# Patient Record
Sex: Male | Born: 1935 | Race: Black or African American | Hispanic: No | Marital: Married | Smoking: Former smoker
Health system: Southern US, Community
[De-identification: ages and names within clinical notes are randomized; demographics above are authoritative.]

## PROBLEM LIST (undated history)

## (undated) ENCOUNTER — Emergency Department (HOSPITAL_COMMUNITY): Admission: EM | Payer: Medicare Other | Source: Home / Self Care

## (undated) DIAGNOSIS — N184 Chronic kidney disease, stage 4 (severe): Secondary | ICD-10-CM

## (undated) DIAGNOSIS — J9611 Chronic respiratory failure with hypoxia: Secondary | ICD-10-CM

## (undated) DIAGNOSIS — E0590 Thyrotoxicosis, unspecified without thyrotoxic crisis or storm: Secondary | ICD-10-CM

## (undated) DIAGNOSIS — I5022 Chronic systolic (congestive) heart failure: Secondary | ICD-10-CM

## (undated) DIAGNOSIS — I428 Other cardiomyopathies: Secondary | ICD-10-CM

## (undated) DIAGNOSIS — I4719 Other supraventricular tachycardia: Secondary | ICD-10-CM

## (undated) DIAGNOSIS — I10 Essential (primary) hypertension: Secondary | ICD-10-CM

## (undated) DIAGNOSIS — R58 Hemorrhage, not elsewhere classified: Secondary | ICD-10-CM

## (undated) DIAGNOSIS — I480 Paroxysmal atrial fibrillation: Secondary | ICD-10-CM

## (undated) DIAGNOSIS — Z9989 Dependence on other enabling machines and devices: Secondary | ICD-10-CM

## (undated) DIAGNOSIS — G4733 Obstructive sleep apnea (adult) (pediatric): Secondary | ICD-10-CM

## (undated) DIAGNOSIS — E042 Nontoxic multinodular goiter: Secondary | ICD-10-CM

## (undated) DIAGNOSIS — S82402A Unspecified fracture of shaft of left fibula, initial encounter for closed fracture: Secondary | ICD-10-CM

## (undated) DIAGNOSIS — K219 Gastro-esophageal reflux disease without esophagitis: Secondary | ICD-10-CM

## (undated) DIAGNOSIS — J449 Chronic obstructive pulmonary disease, unspecified: Secondary | ICD-10-CM

## (undated) DIAGNOSIS — R042 Hemoptysis: Secondary | ICD-10-CM

## (undated) DIAGNOSIS — I82409 Acute embolism and thrombosis of unspecified deep veins of unspecified lower extremity: Secondary | ICD-10-CM

## (undated) DIAGNOSIS — I471 Supraventricular tachycardia: Secondary | ICD-10-CM

## (undated) HISTORY — DX: Acute embolism and thrombosis of unspecified deep veins of unspecified lower extremity: I82.409

## (undated) HISTORY — PX: FOOT SURGERY: SHX648

## (undated) HISTORY — PX: CARDIAC DEFIBRILLATOR PLACEMENT: SHX171

## (undated) HISTORY — DX: Other cardiomyopathies: I42.8

## (undated) HISTORY — DX: Essential (primary) hypertension: I10

---
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
## 2005-07-01 HISTORY — PX: VENA CAVA FILTER PLACEMENT: SUR1032

## 2005-08-11 ENCOUNTER — Encounter: Payer: Self-pay | Admitting: Pulmonary Disease

## 2006-02-11 ENCOUNTER — Encounter: Payer: Self-pay | Admitting: Pulmonary Disease

## 2007-04-29 ENCOUNTER — Ambulatory Visit: Payer: Self-pay | Admitting: Pulmonary Disease

## 2007-06-11 ENCOUNTER — Ambulatory Visit: Payer: Self-pay | Admitting: Oncology

## 2007-07-07 DIAGNOSIS — G4730 Sleep apnea, unspecified: Secondary | ICD-10-CM | POA: Insufficient documentation

## 2007-07-09 ENCOUNTER — Other Ambulatory Visit: Payer: Self-pay | Admitting: Oncology

## 2007-07-09 LAB — CBC WITH DIFFERENTIAL/PLATELET
BASO%: 1.3 % (ref 0.0–2.0)
Eosinophils Absolute: 0.1 10*3/uL (ref 0.0–0.5)
MCHC: 33.9 g/dL (ref 32.0–35.9)
MONO#: 0.6 10*3/uL (ref 0.1–0.9)
NEUT#: 3 10*3/uL (ref 1.5–6.5)
RBC: 4.42 10*6/uL (ref 4.20–5.71)
RDW: 15.1 % — ABNORMAL HIGH (ref 11.2–14.6)
WBC: 4.9 10*3/uL (ref 4.0–10.0)

## 2007-07-09 LAB — COMPREHENSIVE METABOLIC PANEL
ALT: 20 U/L (ref 0–53)
Albumin: 4.3 g/dL (ref 3.5–5.2)
Alkaline Phosphatase: 69 U/L (ref 39–117)
CO2: 25 mEq/L (ref 19–32)
Glucose, Bld: 91 mg/dL (ref 70–99)
Potassium: 5.9 mEq/L — ABNORMAL HIGH (ref 3.5–5.3)
Sodium: 140 mEq/L (ref 135–145)
Total Bilirubin: 0.7 mg/dL (ref 0.3–1.2)
Total Protein: 7.8 g/dL (ref 6.0–8.3)

## 2007-07-10 ENCOUNTER — Encounter: Payer: Self-pay | Admitting: Pulmonary Disease

## 2007-08-03 ENCOUNTER — Encounter: Payer: Self-pay | Admitting: Pulmonary Disease

## 2007-08-24 ENCOUNTER — Ambulatory Visit: Payer: Self-pay | Admitting: Pulmonary Disease

## 2008-02-10 ENCOUNTER — Ambulatory Visit: Payer: Self-pay | Admitting: Oncology

## 2008-06-01 ENCOUNTER — Ambulatory Visit: Payer: Self-pay | Admitting: Pulmonary Disease

## 2008-06-14 ENCOUNTER — Ambulatory Visit: Payer: Self-pay | Admitting: Pulmonary Disease

## 2008-07-04 ENCOUNTER — Ambulatory Visit: Payer: Self-pay | Admitting: Oncology

## 2008-07-06 LAB — CBC WITH DIFFERENTIAL/PLATELET
Eosinophils Absolute: 0 10*3/uL (ref 0.0–0.5)
LYMPH%: 19.9 % (ref 14.0–48.0)
MONO#: 0.5 10*3/uL (ref 0.1–0.9)
NEUT#: 3.3 10*3/uL (ref 1.5–6.5)
Platelets: 123 10*3/uL — ABNORMAL LOW (ref 145–400)
RBC: 4.35 10*6/uL (ref 4.20–5.71)
RDW: 16 % — ABNORMAL HIGH (ref 11.2–14.6)
WBC: 4.8 10*3/uL (ref 4.0–10.0)
lymph#: 1 10*3/uL (ref 0.9–3.3)

## 2008-07-06 LAB — COMPREHENSIVE METABOLIC PANEL
ALT: 32 U/L (ref 0–53)
Albumin: 3.9 g/dL (ref 3.5–5.2)
CO2: 25 mEq/L (ref 19–32)
Calcium: 9.2 mg/dL (ref 8.4–10.5)
Chloride: 107 mEq/L (ref 96–112)
Glucose, Bld: 96 mg/dL (ref 70–99)
Potassium: 4.6 mEq/L (ref 3.5–5.3)
Sodium: 139 mEq/L (ref 135–145)
Total Protein: 7.2 g/dL (ref 6.0–8.3)

## 2008-08-17 ENCOUNTER — Encounter: Payer: Self-pay | Admitting: Pulmonary Disease

## 2009-01-07 ENCOUNTER — Ambulatory Visit (HOSPITAL_BASED_OUTPATIENT_CLINIC_OR_DEPARTMENT_OTHER): Admission: RE | Admit: 2009-01-07 | Discharge: 2009-01-07 | Payer: Self-pay | Admitting: Internal Medicine

## 2009-01-14 ENCOUNTER — Ambulatory Visit: Payer: Self-pay | Admitting: Internal Medicine

## 2009-01-20 ENCOUNTER — Ambulatory Visit: Payer: Self-pay | Admitting: Oncology

## 2009-09-05 ENCOUNTER — Ambulatory Visit (HOSPITAL_COMMUNITY)
Admission: RE | Admit: 2009-09-05 | Discharge: 2009-09-05 | Payer: Self-pay | Source: Home / Self Care | Admitting: Interventional Cardiology

## 2009-09-05 ENCOUNTER — Encounter: Payer: Self-pay | Admitting: Pulmonary Disease

## 2009-09-22 ENCOUNTER — Ambulatory Visit: Payer: Self-pay | Admitting: Pulmonary Disease

## 2009-09-22 DIAGNOSIS — J449 Chronic obstructive pulmonary disease, unspecified: Secondary | ICD-10-CM

## 2009-10-10 ENCOUNTER — Ambulatory Visit (HOSPITAL_COMMUNITY): Admission: RE | Admit: 2009-10-10 | Discharge: 2009-10-10 | Payer: Self-pay | Admitting: Gastroenterology

## 2009-10-30 ENCOUNTER — Ambulatory Visit (HOSPITAL_COMMUNITY): Admission: RE | Admit: 2009-10-30 | Discharge: 2009-10-30 | Payer: Self-pay | Admitting: Cardiology

## 2009-10-31 ENCOUNTER — Telehealth: Payer: Self-pay | Admitting: Pulmonary Disease

## 2009-12-13 ENCOUNTER — Ambulatory Visit: Payer: Self-pay | Admitting: Pulmonary Disease

## 2009-12-13 DIAGNOSIS — G4733 Obstructive sleep apnea (adult) (pediatric): Secondary | ICD-10-CM

## 2010-04-25 ENCOUNTER — Encounter: Admission: RE | Admit: 2010-04-25 | Discharge: 2010-04-25 | Payer: Self-pay | Admitting: Internal Medicine

## 2010-06-06 ENCOUNTER — Encounter (HOSPITAL_COMMUNITY)
Admission: RE | Admit: 2010-06-06 | Discharge: 2010-07-31 | Payer: Self-pay | Source: Home / Self Care | Attending: Internal Medicine | Admitting: Internal Medicine

## 2010-06-11 ENCOUNTER — Ambulatory Visit: Payer: Self-pay | Admitting: Pulmonary Disease

## 2010-06-12 ENCOUNTER — Telehealth: Payer: Self-pay | Admitting: Pulmonary Disease

## 2010-07-05 ENCOUNTER — Ambulatory Visit: Admit: 2010-07-05 | Payer: Self-pay | Admitting: Pulmonary Disease

## 2010-07-06 ENCOUNTER — Telehealth: Payer: Self-pay | Admitting: Pulmonary Disease

## 2010-07-21 ENCOUNTER — Encounter: Payer: Self-pay | Admitting: Internal Medicine

## 2010-07-23 ENCOUNTER — Ambulatory Visit
Admission: RE | Admit: 2010-07-23 | Discharge: 2010-07-23 | Payer: Self-pay | Source: Home / Self Care | Attending: Pulmonary Disease | Admitting: Pulmonary Disease

## 2010-07-31 NOTE — Assessment & Plan Note (Signed)
Summary: consult for dyspnea   Copy to:  Dr. Verdis Prime Primary Provider/Referring Provider:  Dr. Mikeal Hawthorne  CC:  Pulmonary Consult for SOB.Marland Kitchen  History of Present Illness: The pt is a 77 y/o male who I have been asked to see for dyspnea.  He has known minimal obstructive lung disease from pfts while living in [place], and had been treated with advair that he didn't take compliantly.  I have followed him for osa in the past, and he was c/o worsening dyspnea in 2009 for which I did repeat pfts that showed at least moderate obstructive lung disease.  A f/u apptm was scheduled to start on more aggressive treatment, and the pt no-showed, and was lost to followup.  He comes in today where his sob has worsened to the point that it is impacting his QOL.  He gets winded walking to the mailbox, and also walking one flight of stairs.  He has a less than one block doe, but also feels his significant MSK issues also contribute.  He denies any cough or congestion, and produces minimal clear mucus intermittantly.  He does have some LE edema.  He has a h/o VTE, but is s/p IVC filter in 2007.  He is on chronic anticoagulation.  The pt was on amiodarone for his atrial arrhythmia, but with his recent worsening sob and ESR of 46, this has been discontinued.  He has had a recent set of [facility], which show stable moderate obstructive disease, but also slightly improved DLCO from 2009.    Preventive Screening-Counseling & Management  Alcohol-Tobacco     Smoking Status: quit  Current Medications (verified): 1)  Aldactone 25 Mg  Tabs (Spironolactone) .... Take 1 Tablet By Mouth Two Times A Day 2)  Lasix 40 Mg  Tabs (Furosemide) .... Take 1 1/2 Tabs By Mouth Once Daily 3)  Warfarin Sodium 6 Mg  Tabs (Warfarin Sodium) .... Use As Directed By [facility] 4)  Digoxin 0.125 Mg  Tabs (Digoxin) .... Take 1 Tablet By Mouth Once A Day 5)  Multivitamins   Tabs (Multiple Vitamin) .... Take 1 Tablet By Mouth Once A Day 6)  Bayer  Low Strength 81 Mg  Tbec (Aspirin) .... Take 1 Tablet By Mouth Once A Day 7)  Lyrica 150 Mg Caps (Pregabalin) .... Two Times A Day 8)  Vitamin C .... Once Daily 9)  Tamsulosin Hcl 0.4 Mg Caps (Tamsulosin Hcl) .... Once Daily 10)  Calcitriol 0.5 Mcg Caps (Calcitriol) .... Once Daily  Allergies (verified): No Known Drug Allergies  Past History:  Past Medical History: nonischemic CM with EF 35%, s/p defibrillator Atrial fibrillation SLEEP APNEA (ICD-780.57) HYPERTENSION (ICD-401.9) Recurrent DVT--s/p IVC filter. Renal insufficiency  Past Surgical History: none  Family History: Reviewed history and no changes required. heart disease-mother Rheumatism-mother  Social History: Reviewed history and no changes required. Patient states former smoker.  Quit in 1985.  Smoked 1-1/2 pdd x 28yrs. Quit drinking and using marajauna x 43yrs. married 1son Retired from Education  Review of Systems       The patient complains of shortness of breath with activity and productive cough.  The patient denies shortness of breath at rest, non-productive cough, coughing up blood, chest pain, irregular heartbeats, acid heartburn, indigestion, loss of appetite, weight change, abdominal pain, difficulty swallowing, sore throat, tooth/dental problems, headaches, nasal congestion/difficulty breathing through nose, sneezing, itching, ear ache, anxiety, depression, hand/feet swelling, joint stiffness or pain, rash, change in color of mucus, and fever.  Vital Signs:  Patient profile:   77 year old male Height:      74 inches Weight:      295.13 pounds BMI:     38.03 O2 Sat:      97 % on Room air Temp:     97.6 degrees F oral Pulse rate:   73 / minute BP sitting:   104 / 72  (right arm) Cuff size:   large  Vitals Entered By: Gweneth Dimitri RN (September 22, 2009 11:25 AM)  O2 Flow:  Room air CC: Pulmonary Consult for SOB. Comments Medications reviewed with patient Daytime contact number verified with  patient. Gweneth Dimitri RN  September 22, 2009 11:24 AM    Physical Exam  General:  ow male in nad Eyes:  PERRLA and EOMI.   Nose:  enlarged turbinates and deviated septum with nasal obstruction Mouth:  elongation of soft palate and uvula Neck:  no jvd, tmg, LN Lungs:  mildly decreased bs, no wheezing or rhonchi Heart:  sounds regular, 2/6 sem Abdomen:  soft and nontender, bs+ Extremities:  2+ edema bilat, no cyanosis pulses diminished bilat but present. Neurologic:  alert and oriented, moves all 4.   Impression & Recommendations:  Problem # 1:  EMPHYSEMA (ICD-492.8) the pt has moderate airflow obstruction by his most recent pfts, and I think he would benefit from treatment with a long-acting bronchodilator regimen.  Given his atrial arrhythmia history, will start with an anticholinergic such as spiriva.  We can intensify therapy from there if he has only partial response.    Problem # 2:  DYSPNEA (ICD-786.05) Multifactorial.  He has obesity and deconditioning issues, moderate obstructive lung disease, known cardiomyopathy.  I do not think amiodarone was playing a signficant role here since his DLCO was actually a little better from prior, and xrays did not show obvious pneumonitis.  He is now off the medication, and may take weeks to be totally cleared.  If there is another alternative, would probably avoid amiodarone in light of his underlying lung disease and ongoing symptoms.  If this is the only/best choice, we can follow pulmonary status more closely while on therapy.  Will send a note to Dr. Katrinka Blazing.  Medications Added to Medication List This Visit: 1)  Lyrica 150 Mg Caps (Pregabalin) .... Two times a day 2)  Vitamin C  .... Once daily 3)  Tamsulosin Hcl 0.4 Mg Caps (Tamsulosin hcl) .... Once daily 4)  Calcitriol 0.5 Mcg Caps (Calcitriol) .... Once daily  Other Orders: Consultation Level IV (09811)  Patient Instructions: 1)  will try on spiriva one inhalation each am 2)  use  xopenex inhaler for rescue only...Marland Kitchen2 puffs up to every 4-6 hrs if needed for persistent shortness of breath. 3)  will talk with Dr. Katrinka Blazing about your heart medication.   Immunization History:  Influenza Immunization History:    Influenza:  historical (03/31/2009)   Appended Document: consult for dyspnea megan, please let the pt know that I need to see him back the end of this month if he doesn't already have a f/u visit scheduled.  thanks.  Appended Document: consult for dyspnea KC, pt has an appt with you scheduled for 10-30-2009 at 11:00am. Is that date ok?   Appended Document: consult for dyspnea that is ok

## 2010-07-31 NOTE — Assessment & Plan Note (Signed)
Summary: rov for emphysema, osa   Copy to:  Dr. Verdis Prime Primary Provider/Referring Provider:  Dr. Mikeal Hawthorne  CC:  Pt is here for a f/u appt.  Pt states he hasnt been using Spiriva and states he lost Xopenex hfa.  Pt c/o coughing up clear sputum.  Pt states he has not been sleeping well at night with cpap but couldn't give me an explanation as to why.  .  History of Present Illness: The pt comes in today for f/u of his known emphysema and osa.  He has not been taking his spiriva, and lost his rescue inhaler.  He continues to have doe, but no significant cough and only minimal white mucus.  He is wearing cpap compliantly, and denies any issues with tolerance regarding mask or pressure.  However, he continues to have major sleep hygiene and circadian rhythm issues that are self induced, and will require behavioral changes on his part to improve.  Current Medications (verified): 1)  Lasix 40 Mg  Tabs (Furosemide) .... Take 1 1/2 Tabs By Mouth Once Daily 2)  Warfarin Sodium 6 Mg  Tabs (Warfarin Sodium) .... Use As Directed By [facility] 3)  Digoxin 0.125 Mg  Tabs (Digoxin) .... Take 1 Tablet By Mouth Once A Day 4)  Multivitamins   Tabs (Multiple Vitamin) .... Take 1 Tablet By Mouth Once A Day 5)  Bayer Low Strength 81 Mg  Tbec (Aspirin) .... Take 1 Tablet By Mouth Once A Day 6)  Lyrica 150 Mg Caps (Pregabalin) .... Two Times A Day 7)  Vitamin C .... Once Daily 8)  Tamsulosin Hcl 0.4 Mg Caps (Tamsulosin Hcl) .... Once Daily 9)  Calcitriol 0.5 Mcg Caps (Calcitriol) .... Once Daily  Allergies (verified): No Known Drug Allergies  Review of Systems       The patient complains of productive cough.  The patient denies shortness of breath with activity, shortness of breath at rest, non-productive cough, coughing up blood, chest pain, irregular heartbeats, acid heartburn, indigestion, loss of appetite, weight change, abdominal pain, difficulty swallowing, sore throat, tooth/dental problems,  headaches, nasal congestion/difficulty breathing through nose, sneezing, itching, ear ache, anxiety, depression, hand/feet swelling, joint stiffness or pain, rash, change in color of mucus, and fever.    Vital Signs:  Patient profile:   77 year old male Height:      74 inches Weight:      285 pounds O2 Sat:      91 % on Room air Temp:     97.4 degrees F oral Pulse rate:   71 / minute BP sitting:   120 / 78  (left arm) Cuff size:   large  Vitals Entered By: Arman Filter LPN (December 13, 2009 9:17 AM)  O2 Flow:  Room air CC: Pt is here for a f/u appt.  Pt states he hasnt been using Spiriva and states he lost Xopenex hfa.  Pt c/o coughing up clear sputum.  Pt states he has not been sleeping well at night with cpap but couldn't give me an explanation as to why.   Comments Medications reviewed with patient Arman Filter LPN  December 13, 2009 9:25 AM    Physical Exam  General:  ow male in nad Nose:  no pressure necrosis or skin breakdown from cpap mask Lungs:  clear to auscultation Heart:  rrr, no mrg Extremities:  mild LE edema, no cyanosis Neurologic:  alert and oriented, moves all 4.   Impression & Recommendations:  Problem # 1:  EMPHYSEMA (ICD-492.8) the pt has not been taking spiriva compliantly, and has lost xopenex inhaler.  I have asked him to get back on his spiriva daily, and to see if his breathing is at an acceptable baseline.  If not, could consider adding LABA/ICS combo for a trial, but need to be careful with beta-adrenergic drugs and his afib.  I have also encouraged him to work on weight reduction and exercise program  Problem # 2:  OBSTRUCTIVE SLEEP APNEA (ICD-327.23) he is wearing cpap compliantly, and has no issues with tolerance.  He is still having issues with his sleep hygeine and circadian rhythm, but he is going to have to work on this with behavioral therapy.  Medications Added to Medication List This Visit: 1)  Spiriva Handihaler 18 Mcg Caps (Tiotropium  bromide monohydrate) .... One puff in handihaler daily 2)  Xopenex Hfa 45 Mcg/act Aero (Levalbuterol tartrate) .... 2 puffs every 6hrs if needed for emergencies  Other Orders: Est. Patient Level III (60454)  Patient Instructions: 1)  use spiriva one inhalation each am only 2)  use xopenex inhaler only for rescue/emergencies if having a hard time.  Can use up to every 6 hrs if needed 3)  After 8 weeks, assess whether you are satisfied with your breathing.  If not, can add a different inhaler as a trial 4)  continue with cpap, work on weight loss 5)  followup with me in 6mos, but let me know if not breathing well.   Prescriptions: XOPENEX HFA 45 MCG/ACT AERO (LEVALBUTEROL TARTRATE) 2 puffs every 6hrs if needed for emergencies  #1 x 6   Entered and Authorized by:   Barbaraann Share MD   Signed by:   Barbaraann Share MD on 12/13/2009   Method used:   Print then Give to Patient   RxID:   [id number] SPIRIVA HANDIHALER 18 MCG  CAPS (TIOTROPIUM BROMIDE MONOHYDRATE) one puff in handihaler daily  #30 x 6   Entered and Authorized by:   Barbaraann Share MD   Signed by:   Barbaraann Share MD on 12/13/2009   Method used:   Print then Give to Patient   RxID:   [id number]

## 2010-07-31 NOTE — Progress Notes (Signed)
Summary: rsc nos appt  Phone Note Call from Patient   Caller: Maurice [email or web address] Call For: Colsen Modi Summary of Call: Rsc 5/2 nos to 6/15 @ 9a. Initial call taken by: Darletta Moll,  Oct 31, 2009 2:10 PM

## 2010-08-02 NOTE — Progress Notes (Signed)
Summary: nos appt  Phone Note Call from Patient   Caller: [email or web address]  Call For: Maurice Delgado Summary of Call: Rsc nos from 11/5 to 1/23. Initial call taken by: Darletta Moll,  July 06, 2010 9:17 AM

## 2010-08-02 NOTE — Progress Notes (Signed)
Summary: nos appt  Phone Note Call from Patient   Caller: [email or web address]  Call For: clance Summary of Call: Rsc nos from 12/12 to 1/5. Initial call taken by: Darletta Moll,  June 12, 2010 3:47 PM

## 2010-08-08 NOTE — Assessment & Plan Note (Signed)
Summary: rov for emphysema and osa   Copy to:  Dr. Verdis Prime Primary Provider/Referring Provider:  Dr. Mikeal Hawthorne  CC:  Routine f/u appt.  States he wears his cpap machine "most nights."  Denies any problems with mask or pressure. Marland Kitchen  History of Present Illness: the pt comes in today for f/u of his known mild to moderate emphysema and osa.  He is wearing cpap most nights, and denies any issues with his mask fit or pressure.  He feels it does help his sleep and daytime alertness.  With respect to his breathing, he is maintaining his usual baseline.  His doe continues, but is no worse.  I have been encouraging him to work on weight loss and conditioning.  He denies any cough or congestion.  Current Medications (verified): 1)  Lasix 40 Mg  Tabs (Furosemide) .... Take 1 1/2 Tabs By Mouth Once Daily 2)  Warfarin Sodium 6 Mg  Tabs (Warfarin Sodium) .... Use As Directed By [facility] 3)  Digoxin 0.125 Mg  Tabs (Digoxin) .... Take 1 Tablet By Mouth Once A Day 4)  Multivitamins   Tabs (Multiple Vitamin) .... Take 1 Tablet By Mouth Once A Day 5)  Bayer Low Strength 81 Mg  Tbec (Aspirin) .... Take 1 Tablet By Mouth Once A Day 6)  Lyrica 150 Mg Caps (Pregabalin) .... Two Times A Day 7)  Vitamin C .... Once Daily 8)  Tamsulosin Hcl 0.4 Mg Caps (Tamsulosin Hcl) .... Once Daily 9)  Calcitriol 0.5 Mcg Caps (Calcitriol) .... Once Daily 10)  Spiriva Handihaler 18 Mcg  Caps (Tiotropium Bromide Monohydrate) .... One Puff in Handihaler Daily 11)  Xopenex Hfa 45 Mcg/act Aero (Levalbuterol Tartrate) .... 2 Puffs Every 6hrs If Needed For Emergencies 12)  Uloric 80 Mg Tabs (Febuxostat) .... Take 1 Tablet By Mouth Once A Day 13)  Oxycodone-Acetaminophen 5-325 Mg Tabs (Oxycodone-Acetaminophen) .... Take 1 Tab By Mouth Every 8 Hours As Needed 14)  Naftin 1 % Crea (Naftifine Hcl) .... Use As Directed  Allergies (verified): No Known Drug Allergies  Review of Systems       The patient complains of shortness of  breath with activity, productive cough, indigestion, weight change, nasal congestion/difficulty breathing through nose, itching, anxiety, hand/feet swelling, and joint stiffness or pain.  The patient denies shortness of breath at rest, non-productive cough, coughing up blood, chest pain, irregular heartbeats, acid heartburn, loss of appetite, abdominal pain, difficulty swallowing, sore throat, tooth/dental problems, headaches, sneezing, ear ache, depression, rash, change in color of mucus, and fever.    Vital Signs:  Patient profile:   77 year old male Height:      74 inches Weight:      282.38 pounds BMI:     36.39 O2 Sat:      97 % on Room air Temp:     97.6 degrees F oral Pulse rate:   122 / minute BP sitting:   118 / 78  (left arm) Cuff size:   large  Vitals Entered By: Arman Filter LPN (July 23, 2010 10:28 AM)  O2 Flow:  Room air CC: Routine f/u appt.  States he wears his cpap machine "most nights."  Denies any problems with mask or pressure.  Comments Medications reviewed with patient Arman Filter LPN  July 23, 2010 10:32 AM    Physical Exam  General:  ow male in nad Nose:  no skin breakdown or pressure necrosis from cpap mask Lungs:  mild decrease in bs, no wheezing  or rhonchi Heart:  rrr Extremities:  2+ edema bilat, no cyanosis  Neurologic:  alert and oriented, moves all 4  does not appear sleepy.   Impression & Recommendations:  Problem # 1:  OBSTRUCTIVE SLEEP APNEA (ICD-327.23)  the pt is wearing cpap compliantly, and feels it does help with sleep and daytime alertness.  He needs to make adjustments to his humidification, and the dme needs to contact him about supplies.  Will send an order to the dme to instruct him on the use/adjustments to his machine, and also to get him new supplies.  I have also encouraged him to work on weight loss.  Problem # 2:  EMPHYSEMA (ICD-492.8)  the pt feels the spiriva does help his breathing.  I have asked him to continue  on this, and to work on weight loss and conditioning.  Medications Added to Medication List This Visit: 1)  Uloric 80 Mg Tabs (Febuxostat) .... Take 1 tablet by mouth once a day 2)  Oxycodone-acetaminophen 5-325 Mg Tabs (Oxycodone-acetaminophen) .... Take 1 tab by mouth every 8 hours as needed 3)  Naftin 1 % Crea (Naftifine hcl) .... Use as directed  Other Orders: Est. Patient Level III ([address]) DME Referral (DME)  Patient Instructions: 1)  stay on current breathing meds. 2)  work on weight loss and conditioning as able 3)  continue with cpap...will get your dme to show you how to use heater and also help with machine maintenance. 4)  followup with me in 6mos.

## 2010-09-14 ENCOUNTER — Encounter (INDEPENDENT_AMBULATORY_CARE_PROVIDER_SITE_OTHER): Payer: Self-pay | Admitting: *Deleted

## 2010-09-18 LAB — PROTIME-INR: INR: 1.32 (ref 0.00–1.49)

## 2010-09-18 LAB — BASIC METABOLIC PANEL
CO2: 30 mEq/L (ref 19–32)
Calcium: 9.4 mg/dL (ref 8.4–10.5)
Chloride: 105 mEq/L (ref 96–112)
GFR calc Af Amer: 36 mL/min — ABNORMAL LOW (ref >60)
Sodium: 140 mEq/L (ref 135–145)

## 2010-09-18 LAB — CBC
Hemoglobin: 12.1 g/dL — ABNORMAL LOW (ref 13.0–17.0)
MCHC: 33.6 g/dL (ref 30.0–36.0)
RBC: 3.99 MIL/uL — ABNORMAL LOW (ref 4.22–5.81)

## 2010-09-18 LAB — SURGICAL PCR SCREEN

## 2010-09-18 NOTE — Letter (Signed)
Summary: Appointment - Reminder 2  [facility], Main Office  [address]   [place], [place] [address]   Phone: [phone]  Fax: [phone]     September 14, 2010 MRN: [id number]   Maurice Delgado [address] [place], [place]  [address]   Dear Maurice Delgado,  Our records indicate that it is time to schedule a follow-up appointment.  Dr.Allred recommended that you follow up with [place] in May. It is very important that we reach you to schedule this appointment. We look forward to participating in your health care needs. Please contact us at the number listed above at your earliest convenience to schedule your appointment.  If you are unable to make an appointment at this time, give [place] a call so we can update our records.     Sincerely,   Glass blower/designer

## 2010-11-13 NOTE — Procedures (Signed)
NAME:  Maurice Delgado, Maurice Delgado NO.:  [id number]   MEDICAL RECORD NO.:  [id number]          PATIENT TYPE:  OUT   LOCATION:  SLEEP CENTER                 FACILITY:  [facility]   PHYSICIAN:  Clinton D. Maple Hudson, MD, FCCP, FACPDATE OF BIRTH:  23-Dec-1935   DATE OF STUDY:  01/07/2009                            NOCTURNAL POLYSOMNOGRAM   REFERRING PHYSICIAN:  [name], M.D.   INDICATION FOR STUDY:  Hypersomnia with sleep apnea.   EPWORTH SLEEPINESS SCORE:  8/24.  BMI 35.  Weight 280 pounds.  Height 75  inches.  Neck 17 inches.   MEDICATIONS:  Home medications are charted and reviewed.   SLEEP ARCHITECTURE:  Split study protocol.  During the diagnostic phase,  total sleep time 131 minutes with sleep efficiency of 85.9%.  Stage I  was 5.3%, stage II 87%, stage III absent, REM 7.6% of total sleep time.  Sleep latency 6.5 minutes, REM latency 126 minutes, awake after sleep  onset 14.5 minutes, arousal index 4.6.  No bedtime medication was taken.   RESPIRATORY DATA:  Split study protocol.  Apnea-hypopnea index (AHI)  28.4 per hour.  A total of 62 events was scored including one mixed  apnea and 61 hypopneas.  Events were seen in all sleep positions, more  common while supine.  REM AHI 30.  CPAP was then titrated to 16 CWP, AHI  1.6 per hour.  He wore a medium ResMed full-face Quattro mask with  heated humidifier.   OXYGEN DATA:  Moderately loud snoring before CPAP with oxygen  desaturation to a nadir of 86%.  After CPAP control, mean oxygen  saturation held 93% on room air.   CARDIAC DATA:  The patient reports that he has a defibrillator.  The  rhythm appears paced with a pacer spike, regular rhythm, and occasional  aberrant beat.   MOVEMENT-PARASOMNIA:  Rare limb jerk, insignificant.  Bathroom x2.   IMPRESSIONS-RECOMMENDATIONS:  1. Pertinent to sleep architecture and home sleep habit, the patient      prays each evening after 10:15 p.m. and again before sunrise each  morning.  2. Moderate obstructive sleep apnea-hypopnea syndrome, AHI 28.4 per      hour with most events hypopneas, more common while supine, but seen      in all sleep positions.  Moderately loud snoring with oxygen      desaturation to a nadir of 86% on room air.  3. Successful CPAP titration to 16 CWP, AHI 1.6 per hour.  He wore a      medium ResMed full-face Quattro mask with heated humidifier.      Clinton D. Maple Hudson, MD, [facility], FACP  Diplomate, Biomedical engineer of Sleep Medicine  Electronically Signed     CDY/MEDQ  D:  01/14/2009 13:55:21  T:  01/15/2009 09:49:07  Job:  [id number]

## 2010-11-28 ENCOUNTER — Encounter: Payer: Self-pay | Admitting: Internal Medicine

## 2010-12-06 ENCOUNTER — Encounter: Payer: Self-pay | Admitting: Internal Medicine

## 2010-12-06 ENCOUNTER — Ambulatory Visit (INDEPENDENT_AMBULATORY_CARE_PROVIDER_SITE_OTHER): Payer: Medicare Other | Admitting: Internal Medicine

## 2010-12-06 DIAGNOSIS — I10 Essential (primary) hypertension: Secondary | ICD-10-CM

## 2010-12-06 DIAGNOSIS — I428 Other cardiomyopathies: Secondary | ICD-10-CM

## 2010-12-06 DIAGNOSIS — I509 Heart failure, unspecified: Secondary | ICD-10-CM

## 2010-12-06 DIAGNOSIS — I4891 Unspecified atrial fibrillation: Secondary | ICD-10-CM

## 2010-12-06 DIAGNOSIS — I519 Heart disease, unspecified: Secondary | ICD-10-CM

## 2010-12-06 NOTE — Patient Instructions (Signed)
Your physician recommends that you schedule a follow-up appointment in: 3 months with device clinic and 12 months with Dr Allred  Your physician recommends that you continue on your current medications as directed. Please refer to the Current Medication list given to you today.   

## 2010-12-09 ENCOUNTER — Encounter: Payer: Self-pay | Admitting: Internal Medicine

## 2010-12-09 DIAGNOSIS — I519 Heart disease, unspecified: Secondary | ICD-10-CM | POA: Insufficient documentation

## 2010-12-09 NOTE — Progress Notes (Signed)
Maurice Delgado is a pleasant 77 y.o. AAM patient with a h/o a nonischemic CM (EF 35%), NYHA Class III CHF, and atrial fibrillation sp BiV ICD implantation (MDT) who presents today to establish care in the Electrophysiology device clinic.  His initial BiV ICD was implanted 09/22/00 in [place].  He relocated to [facility] and has been followed by Dr Amil Amen.  His most recent generator change was 10/30/09.   The patient reports doing very well since having a biv ICD implanted and remains very active despite his age.  He has a h/o atrial fibrillation.  He reports having retroperitoneal bleeding previously with coumadin. He was subsequently treated with amiodarone but developed hyperthyroidism and therefore has been taken off of amiodarone.  Today, he  denies symptoms of palpitations, chest pain,  orthopnea, PND,  dizziness, presyncope, syncope, or neurologic sequela.   He has stable bilateral edema.  He also reports that he is primarily limited by chronic back and leg pain.  He also reports dypnea with moderate activity. The patientis tolerating medications without difficulties and is otherwise without complaint today.   Past Medical History  Diagnosis Date  . Nonischemic cardiomyopathy     with EF 35%, s/p CRT-D device implanted  . Atrial fibrillation   . Unspecified sleep apnea   . HTN (hypertension)   . DVT (deep venous thrombosis)     recurrent. s/p IVC filter.   . Renal insufficiency   . Gout     Past Surgical History  Procedure Date  . Cardiac defibrillator placement     initial BiV ICD 09/22/00 in [place].  Gen change (MDT) by Dr Amil Amen 10/30/09    History   Social History  . Marital Status: Married    Spouse Name: N/A    Number of Children: N/A  . Years of Education: N/A   Occupational History  . reitred     from education   Social History Main Topics  . Smoking status: Former Games developer  . Smokeless tobacco: Not on file   Comment: quiti n 1985. Smoked 1 -1/2 ppd for 30 years  . Alcohol Use: No    quit after 40 years  . Drug Use: No     quit useing marajuna after 40 years   . Sexually Active: Not on file   Other Topics Concern  . Not on file   Social History Narrative   Married and has 1 son. Lives in [place].  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    Family History  Problem Relation Age of Onset  . Heart disease Mother     mother also had Rheumatism.    Allergies no known allergies  Current Outpatient Prescriptions  Medication Sig Dispense Refill  . Ascorbic Acid (VITAMIN C) 1000 MG tablet Take 1,000 mg by mouth daily.        Marland Kitchen aspirin 81 MG tablet Take 81 mg by mouth daily.        . calcitRIOL (ROCALTROL) 0.5 MCG capsule Take 0.5 mcg by mouth daily.        . digoxin (LANOXIN) 0.125 MG tablet Take 125 mcg by mouth daily.        . Febuxostat (ULORIC) 80 MG TABS Take by mouth daily.        . furosemide (LASIX) 40 MG tablet Take by mouth daily. Alt. 1 tab every other day with 2 tabs every other day.      . levalbuterol (XOPENEX HFA) 45 MCG/ACT inhaler Inhale 2 puffs into the lungs every  6 (six) hours as needed.        . multivitamin (THERAGRAN) per tablet Take 1 tablet by mouth daily.        . naftifine (NAFTIN) 1 % cream Apply topically daily. Use as directed.       Marland Kitchen oxyCODONE-acetaminophen (PERCOCET) 5-325 MG per tablet Take 1 tablet by mouth every 8 (eight) hours as needed.        . pregabalin (LYRICA) 150 MG capsule Take 150 mg by mouth 2 (two) times daily.        . Tamsulosin HCl (FLOMAX) 0.4 MG CAPS Take 0.4 mg by mouth daily.        Marland Kitchen tiotropium (SPIRIVA HANDIHALER) 18 MCG inhalation capsule Place 18 mcg into inhaler and inhale daily.        Marland Kitchen warfarin (COUMADIN) 6 MG tablet Take 6 mg by mouth daily. Use as directed by Coumadin clinic.         ROS- all systems are reviewed and negative except as per HPI  Physical Exam: Filed Vitals:   12/06/10 1123  BP: 110/60  Pulse: 72  Height: 6\' 3"  (1.905 m)  Weight: 279 lb (126.554 kg)    GEN- The patient is overweight  appearing, alert and oriented x 3 today.   Head- normocephalic, atraumatic Eyes-  Sclera clear, conjunctiva pink Ears- hearing intact Oropharynx- clear Neck- supple, no JVP Lymph- no cervical lymphadenopathy Lungs- Clear to ausculation bilaterally, normal work of breathing Chest- ICD pocket is well healed Heart- Regular rate and rhythm, no murmurs, rubs or gallops,  GI- soft, NT, ND, + BS Extremities- no clubbing, cyanosis, + chronic edema with venous stasis changes MS- no significant deformity or atrophy Skin- no rash or lesion Psych- euthymic mood, full affect Neuro- strength and sensation are intact  ICD interrogation- reviewed in detail today,  See PACEART report  Assessment and Plan:

## 2010-12-09 NOTE — Assessment & Plan Note (Signed)
Stable CHF s/p BiV ICD implantation Normal BiV ICD function See Pace Art report

## 2010-12-09 NOTE — Assessment & Plan Note (Signed)
Maintaining sinus rhythm off amiodarone.  Given age, HTN, and CHF, his CHADS2 score is 3.  He should therefore be maintained on coumadin longterm.

## 2010-12-09 NOTE — Assessment & Plan Note (Signed)
Stable No change required today  

## 2011-01-23 ENCOUNTER — Ambulatory Visit: Payer: Self-pay | Admitting: Pulmonary Disease

## 2011-03-13 ENCOUNTER — Encounter: Payer: Managed Care, Other (non HMO) | Admitting: *Deleted

## 2011-06-27 ENCOUNTER — Emergency Department (HOSPITAL_COMMUNITY): Admission: EM | Admit: 2011-06-27 | Discharge: 2011-06-27 | Payer: Self-pay

## 2011-07-01 ENCOUNTER — Encounter (HOSPITAL_COMMUNITY): Payer: Self-pay | Admitting: Nurse Practitioner

## 2011-07-01 ENCOUNTER — Emergency Department (HOSPITAL_COMMUNITY): Payer: Medicare Other

## 2011-07-01 ENCOUNTER — Emergency Department (HOSPITAL_COMMUNITY)
Admission: EM | Admit: 2011-07-01 | Discharge: 2011-07-02 | Disposition: A | Discharge status: Home / Self Care | Payer: Medicare Other | Attending: Emergency Medicine | Admitting: Emergency Medicine

## 2011-07-01 DIAGNOSIS — Z79899 Other long term (current) drug therapy: Secondary | ICD-10-CM | POA: Insufficient documentation

## 2011-07-01 DIAGNOSIS — N189 Chronic kidney disease, unspecified: Secondary | ICD-10-CM | POA: Insufficient documentation

## 2011-07-01 DIAGNOSIS — Z7982 Long term (current) use of aspirin: Secondary | ICD-10-CM | POA: Insufficient documentation

## 2011-07-01 DIAGNOSIS — J449 Chronic obstructive pulmonary disease, unspecified: Secondary | ICD-10-CM | POA: Insufficient documentation

## 2011-07-01 DIAGNOSIS — Z8639 Personal history of other endocrine, nutritional and metabolic disease: Secondary | ICD-10-CM | POA: Insufficient documentation

## 2011-07-01 DIAGNOSIS — Z7901 Long term (current) use of anticoagulants: Secondary | ICD-10-CM | POA: Insufficient documentation

## 2011-07-01 DIAGNOSIS — R042 Hemoptysis: Secondary | ICD-10-CM | POA: Insufficient documentation

## 2011-07-01 DIAGNOSIS — Z862 Personal history of diseases of the blood and blood-forming organs and certain disorders involving the immune mechanism: Secondary | ICD-10-CM | POA: Insufficient documentation

## 2011-07-01 DIAGNOSIS — J4489 Other specified chronic obstructive pulmonary disease: Secondary | ICD-10-CM | POA: Insufficient documentation

## 2011-07-01 DIAGNOSIS — I129 Hypertensive chronic kidney disease with stage 1 through stage 4 chronic kidney disease, or unspecified chronic kidney disease: Secondary | ICD-10-CM | POA: Insufficient documentation

## 2011-07-01 DIAGNOSIS — Z86718 Personal history of other venous thrombosis and embolism: Secondary | ICD-10-CM | POA: Insufficient documentation

## 2011-07-01 DIAGNOSIS — R609 Edema, unspecified: Secondary | ICD-10-CM | POA: Insufficient documentation

## 2011-07-01 HISTORY — DX: Chronic obstructive pulmonary disease, unspecified: J44.9

## 2011-07-01 LAB — BASIC METABOLIC PANEL
Calcium: 9.9 mg/dL (ref 8.4–10.5)
GFR calc Af Amer: 48 mL/min — ABNORMAL LOW (ref >90)
GFR calc non Af Amer: 42 mL/min — ABNORMAL LOW (ref >90)
Glucose, Bld: 94 mg/dL (ref 70–99)
Potassium: 4.3 mEq/L (ref 3.5–5.1)
Sodium: 139 mEq/L (ref 135–145)

## 2011-07-01 LAB — CBC
Hemoglobin: 12.6 g/dL — ABNORMAL LOW (ref 13.0–17.0)
MCH: 28.3 pg (ref 26.0–34.0)
Platelets: 111 10*3/uL — ABNORMAL LOW (ref 150–400)
RBC: 4.46 MIL/uL (ref 4.22–5.81)
WBC: 4.3 10*3/uL (ref 4.0–10.5)

## 2011-07-01 LAB — PROTIME-INR
INR: 2.35 — ABNORMAL HIGH (ref 0.00–1.49)
Prothrombin Time: 26.1 seconds — ABNORMAL HIGH (ref 11.6–15.2)

## 2011-07-01 MED ORDER — IOHEXOL 300 MG/ML  SOLN
100.0000 mL | Freq: Once | INTRAMUSCULAR | Status: AC | PRN
  Administered 2011-07-01: 100 mL via INTRAVENOUS

## 2011-07-01 NOTE — ED Provider Notes (Signed)
History     CSN: [id number]  Arrival date & time 07/01/11  1539   First MD Initiated Contact with Patient 07/01/11 1938      Chief Complaint  Patient presents with  . Hemoptysis    (Consider location/radiation/quality/duration/timing/severity/associated sxs/prior treatment) HPI Complains of coughing up small blood clots for past 1.5 weeks no shortness of breath no chest pain no other complaint. No other associated symptoms no pain no shortness of breath no fever. No chest pain. Past Medical History  Diagnosis Date  . Nonischemic cardiomyopathy     with EF 35%, s/p CRT-D device implanted  . Campath-induced atrial fibrillation   . Unspecified sleep apnea   . HTN (hypertension)   . DVT (deep venous thrombosis)     recurrent. s/p IVC filter.   . Renal insufficiency   . Gout   . COPD (chronic obstructive pulmonary disease)     Past Surgical History  Procedure Date  . Cardiac defibrillator placement     initial BiV ICD 09/22/00 in [place].  Gen change (MDT) by Dr Amil Amen 10/30/09  . Pacemaker insertion     Family History  Problem Relation Age of Onset  . Heart disease Mother     mother also had Rheumatism.    History  Substance Use Topics  . Smoking status: Former Games developer  . Smokeless tobacco: Not on file   Comment: quiti n 1985. Smoked 1 -1/2 ppd for 30 years  . Alcohol Use: No     quit after 40 years      Review of Systems  Constitutional: Negative.   HENT: Negative.   Respiratory: Negative.        Hemoptysis  Cardiovascular: Negative.   Gastrointestinal: Negative.   Musculoskeletal: Negative.        Chronic edema of left leg for several years,uncanged  Skin: Negative.   Neurological: Negative.   Hematological: Negative.   Psychiatric/Behavioral: Negative.     Allergies  Review of patient's allergies indicates no known allergies.  Home Medications   Current Outpatient Rx  Name Route Sig Dispense Refill  . VITAMIN C 1000 MG PO TABS Oral Take 1,000 mg by  mouth daily.      . ASPIRIN 81 MG PO TABS Oral Take 81 mg by mouth daily.      Marland Kitchen CALCITRIOL 0.5 MCG PO CAPS Oral Take 0.5 mcg by mouth daily.      Marland Kitchen DIGOXIN 0.125 MG PO TABS Oral Take 125 mcg by mouth daily.      . FEBUXOSTAT 80 MG PO TABS Oral Take by mouth daily.      . FUROSEMIDE 40 MG PO TABS Oral Take by mouth daily. Alt. 1 tab every other day with 2 tabs every other day.    Marland Kitchen LEVALBUTEROL TARTRATE 45 MCG/ACT IN AERO Inhalation Inhale 2 puffs into the lungs every 6 (six) hours as needed.      . MULTIVITAMINS PO TABS Oral Take 1 tablet by mouth daily.      Marland Kitchen NAFTIFINE HCL 1 % EX CREA Topical Apply topically daily. Use as directed.     Marland Kitchen PREGABALIN 150 MG PO CAPS Oral Take 150 mg by mouth 2 (two) times daily.      Marland Kitchen TAMSULOSIN HCL 0.4 MG PO CAPS Oral Take 0.4 mg by mouth daily.      Marland Kitchen TIOTROPIUM BROMIDE MONOHYDRATE 18 MCG IN CAPS Inhalation Place 18 mcg into inhaler and inhale daily.      . WARFARIN SODIUM 6 MG  PO TABS Oral Take 6-9 mg by mouth daily. Patient takes 9 mg on mon, wed, fri, and Saturday. Takes 6 mg on remaining days of week      BP 121/79  Pulse 80  Temp(Src) 97.6 F (36.4 C) (Oral)  Resp 20  Ht 6\' 2"  (1.88 m)  Wt 280 lb (127.007 kg)  BMI 35.95 kg/m2  SpO2 97%  Physical Exam  Nursing note and vitals reviewed. Constitutional: He appears well-developed and well-nourished.  HENT:  Head: Normocephalic and atraumatic.  Eyes: Conjunctivae are normal. Pupils are equal, round, and reactive to light.  Neck: Neck supple. No tracheal deviation present. No thyromegaly present.  Cardiovascular: Normal rate and regular rhythm.   No murmur heard. Pulmonary/Chest: Effort normal and breath sounds normal.  Abdominal: Soft. Bowel sounds are normal. He exhibits no distension. There is no tenderness.  Musculoskeletal: Normal range of motion. He exhibits no edema and no tenderness.       Left lower extremity with 2+ nonpitting edema with brawny skin changes, neurovascularly intact. Oh  all extremities nontender no edema neurovascular intact  Neurological: He is alert. Coordination normal.  Skin: Skin is warm and dry. No rash noted.  Psychiatric: He has a normal mood and affect.   ED Course  Procedures (including critical care time)   11:50 PM is resting comfortably denies shortness of breath he has had a small amount of hemoptysis while in the emergency department. Labs Reviewed  CBC - Abnormal; Notable for the following:    Hemoglobin 12.6 (*)    HCT 38.9 (*)    Platelets 111 (*) CONSISTENT WITH PREVIOUS RESULT   All other components within normal limits  BASIC METABOLIC PANEL  PROTIME-INR   Dg Chest 2 View  07/01/2011  *RADIOLOGY REPORT*  Clinical Data: Shortness of breath.  CHEST - 2 VIEW  Comparison: 02/28/2010  Findings: There is mild pulmonary vascular prominence.  Overall heart size is normal.  AICD in place.  The interstitial markings are slightly accentuated at the bases.  No effusions.  No acute osseous abnormalities.  IMPRESSION: Pulmonary vascular congestion with slight interstitial accentuation at the bases which could represent early pulmonary edema.  Original Report Authenticated By: Gwynn Burly, M.D.   07/01/2011  No diagnosis found.  Results for orders placed during the hospital encounter of 07/01/11  BASIC METABOLIC PANEL      Component Value Range   Sodium 139  135 - 145 (mEq/L)   Potassium 4.3  3.5 - 5.1 (mEq/L)   Chloride 103  96 - 112 (mEq/L)   CO2 31  19 - 32 (mEq/L)   Glucose, Bld 94  70 - 99 (mg/dL)   BUN 19  6 - 23 (mg/dL)   Creatinine, Ser 4.09 (*) 0.50 - 1.35 (mg/dL)   Calcium 9.9  8.4 - 81.1 (mg/dL)   GFR calc non Af Amer 42 (*) >90 (mL/min)   GFR calc Af Amer 48 (*) >90 (mL/min)  CBC      Component Value Range   WBC 4.3  4.0 - 10.5 (K/uL)   RBC 4.46  4.22 - 5.81 (MIL/uL)   Hemoglobin 12.6 (*) 13.0 - 17.0 (g/dL)   HCT 91.4 (*) 78.2 - 52.0 (%)   MCV 87.2  78.0 - 100.0 (fL)   MCH 28.3  26.0 - 34.0 (pg)   MCHC 32.4  30.0  - 36.0 (g/dL)   RDW 95.6  21.3 - 08.6 (%)   Platelets 111 (*) 150 - 400 (K/uL)  PROTIME-INR  Component Value Range   Prothrombin Time 26.1 (*) 11.6 - 15.2 (seconds)   INR 2.35 (*) 0.00 - 1.49    Dg Chest 2 View  07/01/2011  *RADIOLOGY REPORT*  Clinical Data: Shortness of breath.  CHEST - 2 VIEW  Comparison: 02/28/2010  Findings: There is mild pulmonary vascular prominence.  Overall heart size is normal.  AICD in place.  The interstitial markings are slightly accentuated at the bases.  No effusions.  No acute osseous abnormalities.  IMPRESSION: Pulmonary vascular congestion with slight interstitial accentuation at the bases which could represent early pulmonary edema.  Original Report Authenticated By: Gwynn Burly, M.D.   Ct Angio Chest W/cm &/or Wo Cm  07/01/2011  *RADIOLOGY REPORT*  Clinical Data:  Hemoptysis.  Chest pain.  CT ANGIOGRAPHY CHEST WITH CONTRAST  Technique:  Multidetector CT imaging of the chest was performed using the standard protocol during bolus administration of intravenous contrast.  Multiplanar CT image reconstructions including MIPs were obtained to evaluate the vascular anatomy.  Contrast: OMNIPAQUE IOHEXOL 300 MG/ML IV SOLN  Comparison:  Chest x-ray dated 07/01/2011  Findings:  There are no pulmonary emboli.  There is slight hilar and mediastinal adenopathy, nonspecific.  The patient has emphysematous disease, most severe in the upper lobes.  There is also slight bronchiectasis in the lower lobes with a tiny right effusion and mild interstitial disease at the bases which could represent edema or chronic interstitial disease.  There is cardiomegaly which is chronic.  No acute osseous abnormality.  Review of the MIP images confirms the above findings.  IMPRESSION:  1.  No pulmonary emboli. 2. Emphysema. 3.  Interstitial disease at the bases which could be slight interstitial edema or fibrosis. 4.  Slight bronchiectasis at the bases.  Original Report Authenticated  By: Gwynn Burly, M.D.     MDM   Clinically patient does not exhibit pulmonary edema Plan followup Dr. Ailene Rud week.  Blood pressure rechecked within 3 week Diagnosis #1 hemoptysis #2 hypertension  #3 chronic renal insufficiency      Doug Sou, MD 07/01/11 2354

## 2011-07-01 NOTE — ED Notes (Signed)
Pt returned from CT. Pt provided with warm blanket no distress noted.

## 2011-07-01 NOTE — ED Notes (Signed)
Pt c/o coughing up blood and increasing sob over the week. Pt has hx of sob and takes spiriva and a inhaler. Pt denies chest pain. Pt laying in bed quietly no obvious sob noted.

## 2011-07-01 NOTE — ED Notes (Signed)
Patient transported to CT 

## 2011-07-01 NOTE — ED Notes (Signed)
Noticed blood in spit over past week. Also increasingly sob over that time. Denies pain. No fevers. Symptoms are constant.

## 2011-07-03 ENCOUNTER — Encounter: Payer: Self-pay | Admitting: Pulmonary Disease

## 2011-07-03 ENCOUNTER — Ambulatory Visit (INDEPENDENT_AMBULATORY_CARE_PROVIDER_SITE_OTHER): Payer: Medicare Other | Admitting: Pulmonary Disease

## 2011-07-03 VITALS — BP 120/86 | HR 81 | Temp 97.9°F | Ht 75.0 in | Wt 293.8 lb

## 2011-07-03 DIAGNOSIS — J449 Chronic obstructive pulmonary disease, unspecified: Secondary | ICD-10-CM

## 2011-07-03 DIAGNOSIS — J209 Acute bronchitis, unspecified: Secondary | ICD-10-CM

## 2011-07-03 MED ORDER — AMOXICILLIN-POT CLAVULANATE 875-125 MG PO TABS: 1.0000 | ORAL_TABLET | Freq: Two times a day (BID) | ORAL | Status: AC

## 2011-07-03 NOTE — Patient Instructions (Signed)
Will treat with augmentin one in am and pm for next one week.  If your mucus does not clear within 2 weeks, you need to call me to let me know If your breathing does not improve back to your usual baseline, we may need to try you on a different inhaler.   Keep your usual followup with me.

## 2011-07-03 NOTE — Assessment & Plan Note (Signed)
The patient has nothing by history or exam to suggest a COPD exacerbation, but he does have some increased shortness of breath over baseline.  His CT scan recently in the emergency room did suggest a little more interstitial edema, and he does have a known cardiomyopathy.  If he continues to have shortness of breath, may try him on a long-acting beta agonist, but ultimately he may do best with further diuresis.

## 2011-07-03 NOTE — Assessment & Plan Note (Signed)
I suspect the patient has had an episode of acute bronchitis, which then resulted in hemoptysis since he is on Coumadin.  He has not been treated with a course of antibiotics, and we'll go ahead and do that.  If his mucus fails to clear, he may need an airway exam with fiberoptic bronchoscopy.

## 2011-07-03 NOTE — Progress Notes (Signed)
  Subjective:    Patient ID: Maurice Delgado, male    DOB: 1933/12/26, 78 y.o.   MRN: [id number]  HPI The patient comes in today for acute sick visit related to hemoptysis.  This started approximately one week ago, and involved mucus that was initially bright red blood but then maroon.  He did have some chest rattling and congestion, but denied any purulence.  He also had some increased shortness of breath.  The patient is on Coumadin daily, but his recent INR was in an adequate range.  He did go to the emergency room where a chest x-ray and CT scan really showed no acute process.  He did have some increased interstitial changes in the bases on the CT scan that was suggestive of edema.  There were no obvious masses, and no pulmonary embolus.  The patient states the mucus has now turned more maroon and is decreasing in quantity.  He denies any fevers, chills, or sweats.  He has not had any chest pain.   Review of Systems  Constitutional: Negative for fever and unexpected weight change.  HENT: Positive for rhinorrhea and sneezing. Negative for ear pain, nosebleeds, congestion, sore throat, trouble swallowing, dental problem, postnasal drip and sinus pressure.   Eyes: Negative for redness and itching.  Respiratory: Positive for cough, shortness of breath and wheezing. Negative for chest tightness.   Cardiovascular: Positive for leg swelling. Negative for palpitations.  Gastrointestinal: Negative for nausea and vomiting.  Genitourinary: Negative for dysuria.  Musculoskeletal: Negative for joint swelling.  Skin: Negative for rash.  Neurological: Negative for headaches.  Hematological: Does not bruise/bleed easily.  Psychiatric/Behavioral: Negative for dysphoric mood. The patient is not nervous/anxious.        Objective:   Physical Exam Overweight male in no acute distress Nose without purulence or discharge, no evidence for bleeding Oropharynx without lesions or exudates, and no evidence for  bleeding Chest with faint basilar crackles, but no wheezing, adequate air flow bilaterally Cardiac exam with regular rate and rhythm Lower extremities with 2+ edema bilaterally, no cyanosis Alert and oriented, moves all 4 extremities.       Assessment & Plan:

## 2011-07-15 ENCOUNTER — Encounter: Payer: Managed Care, Other (non HMO) | Admitting: *Deleted

## 2011-07-17 ENCOUNTER — Ambulatory Visit (INDEPENDENT_AMBULATORY_CARE_PROVIDER_SITE_OTHER): Payer: Medicare Other | Admitting: *Deleted

## 2011-07-17 ENCOUNTER — Encounter: Payer: Self-pay | Admitting: Internal Medicine

## 2011-07-17 DIAGNOSIS — I428 Other cardiomyopathies: Secondary | ICD-10-CM

## 2011-07-17 DIAGNOSIS — I519 Heart disease, unspecified: Secondary | ICD-10-CM

## 2011-07-17 LAB — ICD DEVICE OBSERVATION
AL AMPLITUDE: 1.875 mv
AL IMPEDENCE ICD: 437 Ohm
AL THRESHOLD: 1.125 V
BATTERY VOLTAGE: 3.1014 V
LV LEAD IMPEDENCE ICD: 646 Ohm
RV LEAD IMPEDENCE ICD: 285 Ohm
RV LEAD THRESHOLD: 1.25 V
TOT-0002: 0
TOT-0006: 20110502000000
TZAT-0001ATACH: 1
TZAT-0001ATACH: 2
TZAT-0001ATACH: 3
TZAT-0001FASTVT: 1
TZAT-0001SLOWVT: 1
TZAT-0001SLOWVT: 2
TZAT-0002ATACH: NEGATIVE
TZAT-0002FASTVT: NEGATIVE
TZAT-0012ATACH: 150 ms
TZAT-0013SLOWVT: 2
TZAT-0013SLOWVT: 2
TZAT-0018ATACH: NEGATIVE
TZAT-0018ATACH: NEGATIVE
TZAT-0018ATACH: NEGATIVE
TZAT-0018FASTVT: NEGATIVE
TZAT-0018SLOWVT: NEGATIVE
TZAT-0018SLOWVT: NEGATIVE
TZAT-0019ATACH: 6 V
TZAT-0019FASTVT: 8 V
TZAT-0019SLOWVT: 8 V
TZAT-0020SLOWVT: 1.5 ms
TZAT-0020SLOWVT: 1.5 ms
TZON-0004SLOWVT: 24
TZON-0004VSLOWVT: 32
TZON-0005SLOWVT: 12
TZST-0001ATACH: 5
TZST-0001ATACH: 6
TZST-0001FASTVT: 3
TZST-0001FASTVT: 4
TZST-0001FASTVT: 5
TZST-0001FASTVT: 6
TZST-0001SLOWVT: 3
TZST-0001SLOWVT: 5
TZST-0001SLOWVT: 6
TZST-0002ATACH: NEGATIVE
TZST-0002ATACH: NEGATIVE
TZST-0002FASTVT: NEGATIVE
TZST-0002FASTVT: NEGATIVE
TZST-0003SLOWVT: 35 J
VENTRICULAR PACING ICD: 99.14 pct
VF: 0

## 2011-07-17 NOTE — Progress Notes (Signed)
ICD check with ICM 

## 2011-07-23 ENCOUNTER — Ambulatory Visit (INDEPENDENT_AMBULATORY_CARE_PROVIDER_SITE_OTHER): Payer: Medicare Other | Admitting: Pulmonary Disease

## 2011-07-23 ENCOUNTER — Ambulatory Visit (INDEPENDENT_AMBULATORY_CARE_PROVIDER_SITE_OTHER)
Admission: RE | Admit: 2011-07-23 | Discharge: 2011-07-23 | Disposition: A | Discharge status: Home / Self Care | Payer: Medicare Other | Source: Ambulatory Visit | Attending: Pulmonary Disease | Admitting: Pulmonary Disease

## 2011-07-23 ENCOUNTER — Encounter: Payer: Self-pay | Admitting: Pulmonary Disease

## 2011-07-23 VITALS — BP 118/70 | HR 75 | Temp 97.8°F | Ht 75.0 in | Wt 285.2 lb

## 2011-07-23 DIAGNOSIS — J449 Chronic obstructive pulmonary disease, unspecified: Secondary | ICD-10-CM

## 2011-07-23 DIAGNOSIS — R042 Hemoptysis: Secondary | ICD-10-CM

## 2011-07-23 NOTE — Patient Instructions (Signed)
Will try adding symbicort 160/4.5  2 inhalations am and pm everyday.  Rinse mouth well Stay on spiriva for now Will check cxr today, and will call you with results. Will re-assess your bloody mucus in 4 weeks.  If this TOTALLY resolves, no further treatment.  If you are still having this in 4 weeks, we need to take a look at your breathing passages.  If you cough up ANY bright red blood, you need to call me immediately.

## 2011-07-23 NOTE — Progress Notes (Signed)
  Subjective:    Patient ID: Maurice Delgado, male    DOB: August 06, 1933, 78 y.o.   MRN: [id number]  HPI Patient comes in today for followup of his history of hemoptysis.  This was initially noted after an episode of acute bronchitis, and the patient has been on chronic Coumadin.  He was treated with antibiotics, but continues to cough up bloody mucus each day.  This was initially bright red blood, but now he tells me the blood is old appearing with brown/burgundy color.  He has not had any further purulence.  The patient also has a history of moderate COPD, and has continued to have dyspnea on exertion despite being on Spiriva.  However, he also has a known cardiomyopathy.  We had discussed in the past intensified and his bronchodilator regimen to see if he would have further improvement.   Review of Systems  Constitutional: Negative for fever and unexpected weight change.  HENT: Negative for ear pain, nosebleeds, congestion, sore throat, rhinorrhea, sneezing, trouble swallowing, dental problem, postnasal drip and sinus pressure.   Eyes: Negative for redness and itching.  Respiratory: Positive for cough and shortness of breath. Negative for chest tightness and wheezing.   Cardiovascular: Positive for leg swelling. Negative for palpitations.  Gastrointestinal: Negative for nausea and vomiting.  Genitourinary: Negative for dysuria.  Musculoskeletal: Negative for joint swelling.  Skin: Negative for rash.  Neurological: Negative for headaches.  Hematological: Does not bruise/bleed easily.  Psychiatric/Behavioral: Negative for dysphoric mood. The patient is not nervous/anxious.        Objective:   Physical Exam Well-developed male in no acute distress Nose without purulence, but there is some mucosal bleeding with crust on the left Oropharynx clear, with no source of bleeding Chest totally clear to auscultation, no wheezing noted Cardiac exam with regular rate and rhythm at this time Lower  extremities with edema noted, no cyanosis Alert and oriented, moves all 4 extremities.       Assessment & Plan:

## 2011-07-23 NOTE — Assessment & Plan Note (Signed)
The pt is continuing to have doe despite being on spiriva.  Most likely this is related to his CM, but will give him a trial of LABA/ICS to see if makes a difference. He will give me some feedback in 4 weeks.

## 2011-07-23 NOTE — Assessment & Plan Note (Signed)
The pt has continued to have blood tinged mucus since the last visit, but now is dark rather than BRB.  This may simply be old blood after his episode of acute bronchitis while on coumadin, but can't exclude an endobronchial lesion or infection that is atypical or a more resistant bacterial pathogen. Will check a cxr today for completeness, but if ok, pt would like to give this a little more time.  I am ok with this since he had a negative ct chest 07/01/11, but if he does not have total resolution of this in 4 weeks would recommend bronchoscopy.

## 2011-09-10 ENCOUNTER — Telehealth: Payer: Self-pay | Admitting: Pulmonary Disease

## 2011-09-10 MED ORDER — BUDESONIDE-FORMOTEROL FUMARATE 160-4.5 MCG/ACT IN AERO: 2.0000 | INHALATION_SPRAY | Freq: Two times a day (BID) | RESPIRATORY_TRACT | Status: DC

## 2011-09-10 NOTE — Telephone Encounter (Signed)
LMOM for pt TCB 

## 2011-09-10 NOTE — Telephone Encounter (Signed)
Spoke with pt and notified of recs per [facility]. He verbalized understanding. I sent rx for symbicort per his request.  Pt was never scheduled for followup. He states does not feel like needs to be seen soon since he is doing well. KC, when do you want to see him back? Please advise, thanks!

## 2011-09-10 NOTE — Telephone Encounter (Signed)
Spoke with pt. He states calling to let [facility] know that he has not had any more cough in the past 4 wks. He states that this has completely resolved. Will forward to [facility] so he is aware.

## 2011-09-10 NOTE — Telephone Encounter (Signed)
6mos is about right if doing well.

## 2011-09-10 NOTE — Telephone Encounter (Signed)
Good news.  Have him stay on his current inhalers, and to call me if he coughs up blood again.  Keep his upcoming followup apptm.

## 2011-09-11 NOTE — Telephone Encounter (Signed)
LMTCB x2  

## 2011-09-12 NOTE — Telephone Encounter (Signed)
I spoke with pt and is aware of KC recs. Pt is scheduled to come in 03/05/12 at 9:15

## 2011-09-16 ENCOUNTER — Telehealth: Payer: Self-pay | Admitting: Pulmonary Disease

## 2011-09-16 DIAGNOSIS — G4733 Obstructive sleep apnea (adult) (pediatric): Secondary | ICD-10-CM

## 2011-09-16 NOTE — Telephone Encounter (Signed)
ATC pt.  Line rang multiple times with no answer and no voicemail.  WCB tomorrow.

## 2011-09-17 NOTE — Telephone Encounter (Signed)
I would like him to stay on symbicort and spiriva for now. I do see pt for sleep apnea since 2009.   We can see if he qualifies for a new machine with [facility].  Please let them preauthorize him.

## 2011-09-17 NOTE — Telephone Encounter (Signed)
LMTCB

## 2011-09-17 NOTE — Telephone Encounter (Signed)
Spoke with pt, he states has two questions for [facility]- 1- should he continue symbicort inhaler and 2- can we order a new CPAP for him. He states that his machine is very old, believes that he has had the same one that he was originally given in [place]. I advised he may need sleep consult, since he only sees KC for pulm issues. He states that he has discussed sleep with him before also. Please advise, thanks!

## 2011-09-18 NOTE — Telephone Encounter (Signed)
lmomtcb -- in order for [facility] to see if he can get a new cpap machine they will need his sleep study

## 2011-09-19 NOTE — Telephone Encounter (Signed)
LMTCB

## 2011-09-20 NOTE — Telephone Encounter (Signed)
Patient returning call.  Call after 1:30.

## 2011-09-20 NOTE — Telephone Encounter (Signed)
ATC x 1. No answer, unable to leave a msg. WCB later.

## 2011-09-20 NOTE — Telephone Encounter (Signed)
Sleep study is in EPIC so I printed this for [facility] and placed order for new cpap machine. ATC home number no answer, no voicemail. ATC cell number NA, and voicemail is full.  Carron Curie, CMA

## 2011-09-20 NOTE — Telephone Encounter (Signed)
ATC, NA and no option to leave msg, WCB

## 2011-09-23 NOTE — Telephone Encounter (Signed)
Spoke with the patient and he is aware that he should continue on both the Symbicort and Spiriva. We are also checking with [facility] regarding a new CPAP machine. Pt verbalized understanding.

## 2011-11-04 ENCOUNTER — Ambulatory Visit
Admission: RE | Admit: 2011-11-04 | Discharge: 2011-11-04 | Disposition: A | Discharge status: Home / Self Care | Payer: BC Managed Care – PPO | Source: Ambulatory Visit | Attending: Orthopedic Surgery | Admitting: Orthopedic Surgery

## 2011-11-04 ENCOUNTER — Other Ambulatory Visit: Payer: Self-pay | Admitting: Orthopedic Surgery

## 2011-11-04 DIAGNOSIS — M545 Low back pain: Secondary | ICD-10-CM

## 2011-12-10 ENCOUNTER — Encounter: Payer: Self-pay | Admitting: Pulmonary Disease

## 2011-12-10 ENCOUNTER — Ambulatory Visit (INDEPENDENT_AMBULATORY_CARE_PROVIDER_SITE_OTHER): Payer: Medicare Other | Admitting: Pulmonary Disease

## 2011-12-10 VITALS — BP 116/72 | HR 76 | Temp 98.2°F | Ht 75.0 in | Wt 284.0 lb

## 2011-12-10 DIAGNOSIS — J441 Chronic obstructive pulmonary disease with (acute) exacerbation: Secondary | ICD-10-CM

## 2011-12-10 DIAGNOSIS — G4733 Obstructive sleep apnea (adult) (pediatric): Secondary | ICD-10-CM

## 2011-12-10 MED ORDER — PREDNISONE 10 MG PO TABS: ORAL_TABLET | ORAL | Status: DC

## 2011-12-10 NOTE — Progress Notes (Signed)
  Subjective:    Patient ID: Maurice Delgado, male    DOB: 1933-10-25, 78 y.o.   MRN: [id number]  HPI The patient comes in today for an acute sick visit.  He gives a 2 day history of increasing shortness of breath, as well as chest tightness and decreased air flow.  He has had minimal cough, and does not feel that he has a chest cold.  He has not had any fevers, chills, or sweats.  He denies any pleuritic or anginal like chest pain, but has had persistent lower extremity edema.  His weight is stable from the last visit.   Review of Systems  Constitutional: Negative.  Negative for fever and unexpected weight change.  HENT: Negative.  Negative for ear pain, nosebleeds, congestion, sore throat, rhinorrhea, sneezing, trouble swallowing, dental problem, postnasal drip and sinus pressure.   Eyes: Negative.  Negative for redness and itching.  Respiratory: Positive for chest tightness, shortness of breath and wheezing.   Cardiovascular: Positive for leg swelling. Negative for palpitations.  Gastrointestinal: Negative.  Negative for nausea and vomiting.  Genitourinary: Negative.  Negative for dysuria.  Musculoskeletal: Negative.  Negative for joint swelling.  Skin: Negative.  Negative for rash.  Neurological: Negative.  Negative for headaches.  Hematological: Negative.  Does not bruise/bleed easily.  Psychiatric/Behavioral: Negative.  Negative for dysphoric mood. The patient is not nervous/anxious.        Objective:   Physical Exam Overweight male in no acute distress Nose without purulence or discharge noted Chest with decreased breath sounds, no definite active wheezing Cardiac exam is regular rate and rhythm Lower extremities with 1-2+ edema bilaterally, no cyanosis Alert and oriented, moves all 4 extremities.       Assessment & Plan:

## 2011-12-10 NOTE — Assessment & Plan Note (Signed)
The patient has worsening shortness of breath over the last few days that I suspect is secondary to a COPD exacerbation.  He gives no history to suggest an active pulmonary infection.  He does have a history of chronic heart disease, but his edema is fairly stable, and his weight has not changed since the last visit.  I would like to treat him initially with a short course of prednisone to see if things improve.  If they do not, the patient knows to call us immediately.

## 2011-12-10 NOTE — Patient Instructions (Signed)
Will treat with a short course of prednisone to see if your breathing will improve. Will see what is holding up your new cpap machine. Keep followup apptm from last visit, but call me if your breathing does not improve or if worsens.

## 2012-01-27 ENCOUNTER — Encounter: Payer: Self-pay | Admitting: Internal Medicine

## 2012-01-27 ENCOUNTER — Ambulatory Visit (INDEPENDENT_AMBULATORY_CARE_PROVIDER_SITE_OTHER): Payer: Medicare Other | Admitting: Internal Medicine

## 2012-01-27 ENCOUNTER — Other Ambulatory Visit: Payer: Self-pay | Admitting: Pulmonary Disease

## 2012-01-27 VITALS — BP 110/72 | HR 62 | Ht 75.0 in | Wt 283.0 lb

## 2012-01-27 DIAGNOSIS — I10 Essential (primary) hypertension: Secondary | ICD-10-CM

## 2012-01-27 DIAGNOSIS — I4891 Unspecified atrial fibrillation: Secondary | ICD-10-CM

## 2012-01-27 DIAGNOSIS — I499 Cardiac arrhythmia, unspecified: Secondary | ICD-10-CM

## 2012-01-27 DIAGNOSIS — I519 Heart disease, unspecified: Secondary | ICD-10-CM

## 2012-01-27 LAB — ICD DEVICE OBSERVATION
AL AMPLITUDE: 2.4 mv
AL IMPEDENCE ICD: 437 Ohm
ATRIAL PACING ICD: 18.05 pct
CHARGE TIME: 9.148 s
LV LEAD IMPEDENCE ICD: 551 Ohm
RV LEAD IMPEDENCE ICD: 266 Ohm
TOT-0001: 1
TOT-0006: 20110502000000
TZAT-0001ATACH: 1
TZAT-0002ATACH: NEGATIVE
TZAT-0002ATACH: NEGATIVE
TZAT-0002FASTVT: NEGATIVE
TZAT-0011SLOWVT: 10 ms
TZAT-0011SLOWVT: 10 ms
TZAT-0012SLOWVT: 200 ms
TZAT-0012SLOWVT: 200 ms
TZAT-0013SLOWVT: 2
TZAT-0013SLOWVT: 2
TZAT-0018ATACH: NEGATIVE
TZAT-0018ATACH: NEGATIVE
TZAT-0018SLOWVT: NEGATIVE
TZAT-0018SLOWVT: NEGATIVE
TZAT-0019ATACH: 6 V
TZAT-0019ATACH: 6 V
TZAT-0019ATACH: 6 V
TZAT-0019SLOWVT: 8 V
TZAT-0019SLOWVT: 8 V
TZAT-0020ATACH: 1.5 ms
TZAT-0020ATACH: 1.5 ms
TZAT-0020FASTVT: 1.5 ms
TZON-0004SLOWVT: 24
TZON-0005SLOWVT: 12
TZST-0001ATACH: 5
TZST-0001FASTVT: 3
TZST-0001FASTVT: 5
TZST-0001SLOWVT: 4
TZST-0001SLOWVT: 6
TZST-0002ATACH: NEGATIVE
TZST-0002ATACH: NEGATIVE
TZST-0002FASTVT: NEGATIVE
TZST-0002FASTVT: NEGATIVE
TZST-0003SLOWVT: 35 J
TZST-0003SLOWVT: 35 J

## 2012-01-27 NOTE — Assessment & Plan Note (Signed)
Maintaining sinus rhythm He appears to be tolerating coumadin at this time

## 2012-01-27 NOTE — Assessment & Plan Note (Signed)
Stable No change required today  

## 2012-01-27 NOTE — Patient Instructions (Signed)
Your physician wants you to follow-up in: 12 months with Dr Jacquiline Doe will receive a reminder letter in the mail two months in advance. If you don't receive a letter, please call our office to schedule the follow-up appointment.  Remote monitoring is used to monitor your Pacemaker of ICD from home. This monitoring reduces the number of office visits required to check your device to one time per year. It allows [place] to keep an eye on the functioning of your device to ensure it is working properly. You are scheduled for a device check from home on 04/27/2012. You may send your transmission at any time that day. If you have a wireless device, the transmission will be sent automatically. After your physician reviews your transmission, you will receive a postcard with your next transmission date.

## 2012-01-27 NOTE — Progress Notes (Signed)
PCP: [name], MD Primary Cardiologist:  Dr Dory Peru is a 78 y.o. male with a h/o a nonischemic CM (EF 35%), NYHA Class III CHF, and atrial fibrillation sp BiV ICD implantation (MDT) who presents today for follow-up.   He has a h/o atrial fibrillation.  He reports having retroperitoneal bleeding previously with coumadin. He was subsequently treated with amiodarone but developed hyperthyroidism and therefore has been taken off of amiodarone.  Today, he  denies symptoms of palpitations, chest pain,  orthopnea, PND,  dizziness, presyncope, syncope, or neurologic sequela.   He has stable bilateral edema.  He also reports that he is primarily limited by chronic back and leg pain.  He also reports dypnea with moderate activity which is stable. The patientis tolerating medications without difficulties and is otherwise without complaint today.   Past Medical History  Diagnosis Date  . Nonischemic cardiomyopathy     with EF 35%, s/p CRT-D device implanted  . Atrial fibrillation   . Unspecified sleep apnea   . HTN (hypertension)   . DVT (deep venous thrombosis)     recurrent. s/p IVC filter.   . Renal insufficiency   . Gout   . COPD (chronic obstructive pulmonary disease)     Past Surgical History  Procedure Date  . Cardiac defibrillator placement     initial BiV ICD 09/22/00 in [place].  Gen change (MDT) by Dr Amil Amen 10/30/09  . Pacemaker insertion     History   Social History  . Marital Status: Married    Spouse Name: N/A    Number of Children: N/A  . Years of Education: N/A   Occupational History  . reitred     from education   Social History Main Topics  . Smoking status: Former Smoker -- 1.5 packs/day for 30 years    Types: Cigarettes    Quit date: 07/02/1983  . Smokeless tobacco: Not on file  . Alcohol Use: No     quit after 40 years  . Drug Use: No     quit useing marajuna after 40 years   . Sexually Active: Not on file   Other Topics Concern  . Not on file     Social History Narrative   Married and has 1 son. Lives in [place].  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    Family History  Problem Relation Age of Onset  . Heart disease Mother     mother also had Rheumatism.    No Known Allergies  Current Outpatient Prescriptions  Medication Sig Dispense Refill  . Ascorbic Acid (VITAMIN C) 1000 MG tablet Take 1,000 mg by mouth daily.        Marland Kitchen aspirin 81 MG tablet Take 81 mg by mouth daily.        . budesonide-formoterol (SYMBICORT) 160-4.5 MCG/ACT inhaler Inhale 2 puffs into the lungs 2 (two) times daily.  3 Inhaler  0  . calcitRIOL (ROCALTROL) 0.5 MCG capsule Take 0.5 mcg by mouth every other day.       . Cholecalciferol (VITAMIN D PO) Take 1 capsule by mouth 2 (two) times daily.       . digoxin (LANOXIN) 0.125 MG tablet Take 125 mcg by mouth daily.        . Febuxostat (ULORIC) 80 MG TABS Take by mouth daily.        . furosemide (LASIX) 40 MG tablet Take 40 mg by mouth daily.       Marland Kitchen levalbuterol (XOPENEX HFA) 45 MCG/ACT inhaler  Inhale 2 puffs into the lungs every 6 (six) hours as needed.        . metoprolol (TOPROL-XL) 50 MG 24 hr tablet Take 25 mg by mouth daily.        . multivitamin (THERAGRAN) per tablet Take 1 tablet by mouth daily.        . naftifine (NAFTIN) 1 % cream Apply topically daily. Use as directed.       . Olopatadine HCl (PATADAY) 0.2 % SOLN Apply to eye daily.        . predniSONE (DELTASONE) 5 MG tablet Take 5 mg by mouth 2 (two) times daily.      . pregabalin (LYRICA) 150 MG capsule Take 150 mg by mouth 2 (two) times daily.        . Tamsulosin HCl (FLOMAX) 0.4 MG CAPS Take 0.4 mg by mouth daily.        Marland Kitchen tiotropium (SPIRIVA HANDIHALER) 18 MCG inhalation capsule Place 18 mcg into inhaler and inhale daily.        Marland Kitchen warfarin (COUMADIN) 6 MG tablet Take 6-9 mg by mouth daily. Take 9 mg on Mondays, Wednesdays, Fridays, and Saturdays and then 6mg  on all other day.        ROS- all systems are reviewed and negative except as per  HPI  Physical Exam: Filed Vitals:   01/27/12 0855  BP: 110/72  Pulse: 62  Height: 6\' 3"  (1.905 m)  Weight: 283 lb (128.368 kg)  SpO2: 96%    GEN- The patient is overweight appearing, alert and oriented x 3 today.   Head- normocephalic, atraumatic Eyes-  Sclera clear, conjunctiva pink Ears- hearing intact Oropharynx- clear Neck- supple, no JVP Lymph- no cervical lymphadenopathy Lungs- Clear to ausculation bilaterally, normal work of breathing Chest- ICD pocket is well healed Heart- Regular rate and rhythm, no murmurs, rubs or gallops,  GI- soft, NT, ND, + BS Extremities- no clubbing, cyanosis, + chronic edema with venous stasis changes  ICD interrogation- reviewed in detail today,  See PACEART report  Assessment and Plan:

## 2012-01-27 NOTE — Assessment & Plan Note (Signed)
Stable Normal BiV ICD function See Pace Art report No changes today  

## 2012-02-12 ENCOUNTER — Telehealth: Payer: Self-pay | Admitting: Pulmonary Disease

## 2012-02-12 NOTE — Telephone Encounter (Signed)
Let TP know that I think he needs a ct chest and bronch. Has been happening too much even though he is on coumadin.

## 2012-02-12 NOTE — Telephone Encounter (Signed)
I spoke with pt and he c/o coughing up bright red blood (nickle size amount) x 2 weeks. He stated this does not happen everyday. His breathing is becoming more labored and he feels bad depending on the day. I scheduled pt to come in to see TP tomorrow morning at 10:00 am. Will forward to [facility] as an [place]

## 2012-02-12 NOTE — Telephone Encounter (Signed)
Will forward to TP so she is aware of this

## 2012-02-13 ENCOUNTER — Ambulatory Visit (INDEPENDENT_AMBULATORY_CARE_PROVIDER_SITE_OTHER): Payer: Medicare Other | Admitting: Adult Health

## 2012-02-13 ENCOUNTER — Encounter: Payer: Self-pay | Admitting: Adult Health

## 2012-02-13 ENCOUNTER — Other Ambulatory Visit (INDEPENDENT_AMBULATORY_CARE_PROVIDER_SITE_OTHER): Payer: Medicare Other

## 2012-02-13 ENCOUNTER — Ambulatory Visit (INDEPENDENT_AMBULATORY_CARE_PROVIDER_SITE_OTHER)
Admission: RE | Admit: 2012-02-13 | Discharge: 2012-02-13 | Disposition: A | Discharge status: Home / Self Care | Payer: Medicare Other | Source: Ambulatory Visit | Attending: Adult Health | Admitting: Adult Health

## 2012-02-13 VITALS — BP 122/78 | HR 75 | Temp 98.5°F | Ht 75.0 in | Wt 283.2 lb

## 2012-02-13 DIAGNOSIS — J449 Chronic obstructive pulmonary disease, unspecified: Secondary | ICD-10-CM

## 2012-02-13 DIAGNOSIS — R042 Hemoptysis: Secondary | ICD-10-CM

## 2012-02-13 DIAGNOSIS — J4489 Other specified chronic obstructive pulmonary disease: Secondary | ICD-10-CM

## 2012-02-13 LAB — CBC WITH DIFFERENTIAL/PLATELET
Basophils Absolute: 0 10*3/uL (ref 0.0–0.1)
Eosinophils Absolute: 0.1 10*3/uL (ref 0.0–0.7)
HCT: 39.4 % (ref 39.0–52.0)
Lymphs Abs: 0.9 10*3/uL (ref 0.7–4.0)
MCHC: 31.8 g/dL (ref 30.0–36.0)
MCV: 89.5 fl (ref 78.0–100.0)
Monocytes Absolute: 0.7 10*3/uL (ref 0.1–1.0)
Neutrophils Relative %: 77.3 % — ABNORMAL HIGH (ref 43.0–77.0)
Platelets: 110 10*3/uL — ABNORMAL LOW (ref 150.0–400.0)
RDW: 15.8 % — ABNORMAL HIGH (ref 11.5–14.6)
WBC: 7.4 10*3/uL (ref 4.5–10.5)

## 2012-02-13 LAB — BASIC METABOLIC PANEL
BUN: 19 mg/dL (ref 6–23)
Calcium: 9.5 mg/dL (ref 8.4–10.5)
Chloride: 104 mEq/L (ref 96–112)
Creatinine, Ser: 1.5 mg/dL (ref 0.4–1.5)
GFR: 58.43 mL/min — ABNORMAL LOW (ref >60.00)

## 2012-02-13 LAB — PROTIME-INR
INR: 1.7 ratio — ABNORMAL HIGH (ref 0.8–1.0)
Prothrombin Time: 18.7 s — ABNORMAL HIGH (ref 10.2–12.4)

## 2012-02-13 NOTE — Progress Notes (Signed)
  Subjective:    Patient ID: Maurice Delgado, male    DOB: 12/24/1933, 78 y.o.   MRN: [id number]  HPI 78 yo male with known hx of COPD , former smoker, and OSA-on CPAP  complicated by chronic diastolic heart failure , Atrial Fib on chronic coumadin and chronic kidney disease   02/13/2012 Acute OV  Presents for an acute office visit for recurrent hemoptysis . Over last 2 weeks with several episodes of coughing up blood tinged mucus. Mostly clear mucus mixed with traces of bright red to dark blood.  No increased dyspnea, fever or chest pain.  INR today 1.7 .  Last CT scan 06/211 showed> No pulmonary emboli. ,  Emphysema,  Interstitial disease at the bases which could be slight interstitial edema or fibrosis, slight bronchiectasis at the bases.  Remains on symbicort/spiriva.      Review of Systems Constitutional:   No  weight loss, night sweats,  Fevers, chills,  ++fatigue, or  lassitude.  HEENT:   No headaches,  Difficulty swallowing,  Tooth/dental problems, or  Sore throat,                No sneezing, itching, ear ache, nasal congestion, post nasal drip,   CV:  No chest pain,  Orthopnea, PND, swelling in lower extremities, anasarca, dizziness, palpitations, syncope.   GI  No heartburn, indigestion, abdominal pain, nausea, vomiting, diarrhea, change in bowel habits, loss of appetite, bloody stools.   Resp:    No change in color of mucus.  No wheezing.  No chest wall deformity  Skin: no rash or lesions.  GU: no dysuria, change in color of urine, no urgency or frequency.  No flank pain, no hematuria   MS:  No joint pain or swelling.  No decreased range of motion.  No back pain.  Psych:  No change in mood or affect. No depression or anxiety.  No memory loss.         Objective:   Physical Exam GEN: A/Ox3; pleasant , NAD, well nourished   HEENT:  [place]/AT,  EACs-clear, TMs-wnl, NOSE-clear, THROAT-clear, no lesions, no postnasal drip or exudate noted.   NECK:  Supple w/ fair  ROM; no JVD; normal carotid impulses w/o bruits; no thyromegaly or nodules palpated; no lymphadenopathy.  RESP  Coarse BS  w/o, wheezes/ rales/ or rhonchi.no accessory muscle use, no dullness to percussion  CARD:  RRR, no m/r/g  , no peripheral edema, pulses intact, no cyanosis or clubbing.  GI:   Soft & nt; nml bowel sounds; no organomegaly or masses detected.  Musco: Warm bil, no deformities or joint swelling noted.   Neuro: alert, no focal deficits noted.    Skin: Warm, no lesions or rashes         Assessment & Plan:   

## 2012-02-13 NOTE — Patient Instructions (Addendum)
I will call with xray and lab results. We will decide on next step once I get these results.  Continue on current regimen.  follow up with Dr. Shelle Iron as planned  Please contact office for sooner follow up if symptoms do not improve or worsen or seek emergency care

## 2012-02-14 ENCOUNTER — Other Ambulatory Visit: Payer: Self-pay | Admitting: Adult Health

## 2012-02-14 DIAGNOSIS — R042 Hemoptysis: Secondary | ICD-10-CM

## 2012-02-14 NOTE — Assessment & Plan Note (Signed)
Recurrent hemoptysis ? Etiology  Pt is on coumadin however not supra-therapeutic. (actually sub therap at 1.7 )  CXR without acute process  Will repeat CT chest (previious CT in 06/2011 w/out acute process, but given his smoking hx  Will most likely need FOB , have discussed with Dr. Shelle Iron , he will discuss on return in 2 weeks.  Pt is aware if bleeding worsens/increases he is to contact us immediately or go to ER.

## 2012-02-14 NOTE — Telephone Encounter (Signed)
Pt saw TP and a CT was ordered.Carron Curie, CMA

## 2012-02-14 NOTE — Progress Notes (Signed)
Have reviewed ov, and agree with plan as documented.

## 2012-02-20 ENCOUNTER — Ambulatory Visit (INDEPENDENT_AMBULATORY_CARE_PROVIDER_SITE_OTHER)
Admission: RE | Admit: 2012-02-20 | Discharge: 2012-02-20 | Disposition: A | Discharge status: Home / Self Care | Payer: Medicare Other | Source: Ambulatory Visit | Attending: Adult Health | Admitting: Adult Health

## 2012-02-20 DIAGNOSIS — R042 Hemoptysis: Secondary | ICD-10-CM

## 2012-02-21 ENCOUNTER — Encounter: Payer: Self-pay | Admitting: Adult Health

## 2012-02-21 NOTE — Progress Notes (Signed)
Quick Note:  Called, spoke with pt. I informed him of CT chest results and recs per TP. He verbalized understanding of this, is aware of pending OV with [facility] on Sept 5, and voiced no further questions/concerns at this time. ______

## 2012-02-25 ENCOUNTER — Telehealth: Payer: Self-pay | Admitting: *Deleted

## 2012-02-25 ENCOUNTER — Encounter (HOSPITAL_COMMUNITY): Payer: Self-pay

## 2012-02-25 DIAGNOSIS — R042 Hemoptysis: Secondary | ICD-10-CM

## 2012-02-25 NOTE — Telephone Encounter (Signed)
Message copied by Darrell Jewel on Tue Feb 25, 2012 10:40 AM ------      Message from: [place], [place] M      Created: Mon Feb 24, 2012  5:49 PM       Reviewed ct chest.  No definite abnl to cause his recurrent hemoptysis.  Because this has been an ongoing issue, we need to do an airway exam.  Ct misses endobronchial disease quite often.  Pt is agreeable, and will try to do this week.             Lawson Fiscal, please call [facility] first thing Tues am to set up bronch for 730 wed am or thurs am.  No fluoro, small scope, no TB risk.      Let pt know when to be there, NPO p midnight, and must have someone to drive him home after the procedure.

## 2012-02-25 NOTE — Telephone Encounter (Signed)
Bronch scheduled for tomorrow at 7:30am. Pt aware of instructions and arrival time. Carron Curie, CMA

## 2012-02-26 ENCOUNTER — Ambulatory Visit (HOSPITAL_COMMUNITY)
Admission: RE | Admit: 2012-02-26 | Discharge: 2012-02-26 | Disposition: A | Discharge status: Home / Self Care | Payer: Medicare Other | Source: Ambulatory Visit | Attending: Pulmonary Disease | Admitting: Pulmonary Disease

## 2012-02-26 ENCOUNTER — Encounter (HOSPITAL_COMMUNITY)
Admission: RE | Disposition: A | Discharge status: Home / Self Care | Payer: Self-pay | Source: Ambulatory Visit | Attending: Pulmonary Disease

## 2012-02-26 DIAGNOSIS — I5032 Chronic diastolic (congestive) heart failure: Secondary | ICD-10-CM | POA: Insufficient documentation

## 2012-02-26 DIAGNOSIS — N189 Chronic kidney disease, unspecified: Secondary | ICD-10-CM | POA: Insufficient documentation

## 2012-02-26 DIAGNOSIS — G4733 Obstructive sleep apnea (adult) (pediatric): Secondary | ICD-10-CM | POA: Insufficient documentation

## 2012-02-26 DIAGNOSIS — Z7901 Long term (current) use of anticoagulants: Secondary | ICD-10-CM | POA: Insufficient documentation

## 2012-02-26 DIAGNOSIS — R042 Hemoptysis: Secondary | ICD-10-CM

## 2012-02-26 DIAGNOSIS — F17200 Nicotine dependence, unspecified, uncomplicated: Secondary | ICD-10-CM

## 2012-02-26 DIAGNOSIS — I4891 Unspecified atrial fibrillation: Secondary | ICD-10-CM | POA: Insufficient documentation

## 2012-02-26 DIAGNOSIS — J449 Chronic obstructive pulmonary disease, unspecified: Secondary | ICD-10-CM | POA: Insufficient documentation

## 2012-02-26 DIAGNOSIS — Z87891 Personal history of nicotine dependence: Secondary | ICD-10-CM | POA: Insufficient documentation

## 2012-02-26 DIAGNOSIS — J4489 Other specified chronic obstructive pulmonary disease: Secondary | ICD-10-CM | POA: Insufficient documentation

## 2012-02-26 HISTORY — PX: VIDEO BRONCHOSCOPY: SHX5072

## 2012-02-26 SURGERY — VIDEO BRONCHOSCOPY WITHOUT FLUORO
Anesthesia: Moderate Sedation | Laterality: Bilateral

## 2012-02-26 MED ORDER — MIDAZOLAM HCL 10 MG/2ML IJ SOLN
INTRAMUSCULAR | Status: DC | PRN
  Administered 2012-02-26: 5 mg via INTRAVENOUS

## 2012-02-26 MED ORDER — MIDAZOLAM HCL 10 MG/2ML IJ SOLN
INTRAMUSCULAR | Status: AC
  Filled 2012-02-26: qty 4

## 2012-02-26 MED ORDER — SODIUM CHLORIDE 0.9 % IV SOLN
INTRAVENOUS | Status: DC
  Administered 2012-02-26: 08:00:00 via INTRAVENOUS

## 2012-02-26 MED ORDER — LIDOCAINE HCL 2 % EX GEL
CUTANEOUS | Status: DC | PRN
  Administered 2012-02-26: 1

## 2012-02-26 MED ORDER — LIDOCAINE HCL (PF) 1 % IJ SOLN
INTRAMUSCULAR | Status: DC | PRN
  Administered 2012-02-26: 6 mL

## 2012-02-26 MED ORDER — MEPERIDINE HCL 100 MG/ML IJ SOLN
INTRAMUSCULAR | Status: AC
  Filled 2012-02-26: qty 2

## 2012-02-26 MED ORDER — MEPERIDINE HCL 25 MG/ML IJ SOLN
INTRAMUSCULAR | Status: DC | PRN
  Administered 2012-02-26: 50 mg via INTRAVENOUS

## 2012-02-26 MED ORDER — PHENYLEPHRINE HCL 0.25 % NA SOLN
NASAL | Status: DC | PRN
  Administered 2012-02-26: 1 via NASAL

## 2012-02-26 NOTE — Progress Notes (Signed)
Video Bronchoscopy Note  Procedure tolerated well  Intervention bronchial washing done Intervention Bronchial brushing done

## 2012-02-26 NOTE — H&P (View-Only) (Signed)
  Subjective:    Patient ID: Maurice Delgado, male    DOB: 05/21/1934, 78 y.o.   MRN: [id number]  HPI 78 yo male with known hx of COPD , former smoker, and OSA-on CPAP  complicated by chronic diastolic heart failure , Atrial Fib on chronic coumadin and chronic kidney disease   02/13/2012 Acute OV  Presents for an acute office visit for recurrent hemoptysis . Over last 2 weeks with several episodes of coughing up blood tinged mucus. Mostly clear mucus mixed with traces of bright red to dark blood.  No increased dyspnea, fever or chest pain.  INR today 1.7 .  Last CT scan 06/211 showed> No pulmonary emboli. ,  Emphysema,  Interstitial disease at the bases which could be slight interstitial edema or fibrosis, slight bronchiectasis at the bases.  Remains on symbicort/spiriva.      Review of Systems Constitutional:   No  weight loss, night sweats,  Fevers, chills,  ++fatigue, or  lassitude.  HEENT:   No headaches,  Difficulty swallowing,  Tooth/dental problems, or  Sore throat,                No sneezing, itching, ear ache, nasal congestion, post nasal drip,   CV:  No chest pain,  Orthopnea, PND, swelling in lower extremities, anasarca, dizziness, palpitations, syncope.   GI  No heartburn, indigestion, abdominal pain, nausea, vomiting, diarrhea, change in bowel habits, loss of appetite, bloody stools.   Resp:    No change in color of mucus.  No wheezing.  No chest wall deformity  Skin: no rash or lesions.  GU: no dysuria, change in color of urine, no urgency or frequency.  No flank pain, no hematuria   MS:  No joint pain or swelling.  No decreased range of motion.  No back pain.  Psych:  No change in mood or affect. No depression or anxiety.  No memory loss.         Objective:   Physical Exam GEN: A/Ox3; pleasant , NAD, well nourished   HEENT:  [place]/AT,  EACs-clear, TMs-wnl, NOSE-clear, THROAT-clear, no lesions, no postnasal drip or exudate noted.   NECK:  Supple w/ fair  ROM; no JVD; normal carotid impulses w/o bruits; no thyromegaly or nodules palpated; no lymphadenopathy.  RESP  Coarse BS  w/o, wheezes/ rales/ or rhonchi.no accessory muscle use, no dullness to percussion  CARD:  RRR, no m/r/g  , no peripheral edema, pulses intact, no cyanosis or clubbing.  GI:   Soft & nt; nml bowel sounds; no organomegaly or masses detected.  Musco: Warm bil, no deformities or joint swelling noted.   Neuro: alert, no focal deficits noted.    Skin: Warm, no lesions or rashes         Assessment & Plan:

## 2012-02-26 NOTE — Op Note (Signed)
Dictated # [id number]

## 2012-02-26 NOTE — Interval H&P Note (Signed)
History and Physical Interval Note:  02/26/2012 7:45 AM  Maurice Delgado  has presented today for surgery, with the diagnosis of Hemoptysis  The various methods of treatment have been discussed with the patient and family. After consideration of risks, benefits and other options for treatment, the patient has consented to  Procedure(s) (LRB): VIDEO BRONCHOSCOPY WITHOUT FLUORO (Bilateral) as a surgical intervention .  The patient's history has been reviewed, patient examined, no change in status, stable for surgery.  I have reviewed the patient's chart and labs.  Questions were answered to the patient's satisfaction.     Barbaraann Share

## 2012-02-27 ENCOUNTER — Encounter (HOSPITAL_COMMUNITY): Payer: Self-pay | Admitting: Pulmonary Disease

## 2012-02-27 NOTE — Op Note (Signed)
NAMERAISTLIN, GUM NO.:  [id number]  MEDICAL RECORD NO.:  [id number]  LOCATION:  [facility]                         FACILITY:  [facility]  PHYSICIAN:  Barbaraann Share, MD,FCCPDATE OF BIRTH:  1934-05-31  DATE OF PROCEDURE:  02/26/2012 DATE OF DISCHARGE:                              OPERATIVE REPORT   PROCEDURE:  Flexible fiberoptic bronchoscopy with video.  INDICATION FOR THE PROCEDURE:  Recurrent hemoptysis in a patient with long smoking history and underlying COPD.  OPERATOR:  Barbaraann Share, MD, [facility]  ANESTHESIA:  Demerol 50 mg IV, Versed 5 mg IV, and topical 1% lidocaine in the vocal cords and airways during the procedure.  DESCRIPTION:  After obtaining informed consent and under close cardiopulmonary monitoring, the above preop anesthesia was given.  The fiberoptic scope was passed to the right nare and into the posterior pharynx, where there was no lesions or other abnormalities seen.  There was no obvious bleeding site in the upper airway, and glottic structures appeared to be totally normal.  The vocal cords moved bilaterally on phonation.  Scope was then passed into the trachea where it was examined along its entire length down to the level of the carina, all of which was normal.  The left and right tracheobronchial trees were examined serially at the subsegmental level with no endobronchial process noted. There were no blood tinged secretions anywhere in the tracheobronchial tree except in the superior segment of the left lower lobe. Bronchoalveolar lavage was done in the superior segment, left lower lobe and sent for the usual cytologic and bacteriologic evaluations.  There was no obvious endobronchial process seen while the airways were being splinted by the lavage fluid.  Bronchial brushes were done in these areas for completeness and sent for the usual studies.  Overall, the patient tolerated the procedure well.  There were no  complications.     Barbaraann Share, MD,FCCP     [facility]  D:  02/26/2012  T:  02/27/2012  Job:  [id number]

## 2012-03-02 LAB — CULTURE, BAL-QUANTITATIVE W GRAM STAIN: Special Requests: NORMAL

## 2012-03-04 ENCOUNTER — Other Ambulatory Visit: Payer: Self-pay | Admitting: Pulmonary Disease

## 2012-03-04 MED ORDER — CEFDINIR 300 MG PO CAPS: ORAL_CAPSULE | ORAL | Status: DC

## 2012-03-05 ENCOUNTER — Ambulatory Visit: Payer: Managed Care, Other (non HMO) | Admitting: Pulmonary Disease

## 2012-03-24 LAB — FUNGUS CULTURE W SMEAR: Special Requests: NORMAL

## 2012-03-27 ENCOUNTER — Telehealth: Payer: Self-pay | Admitting: Pulmonary Disease

## 2012-03-27 NOTE — Telephone Encounter (Signed)
Pt is awar eof [facility] response, he is scheduled to come in on Monday at 10:15

## 2012-03-27 NOTE — Telephone Encounter (Signed)
Let pt know that he is going to keep some shortness of breath because of his emphysema and weak heart muscle.  The goal will be to keep him as functional as possible. I would like to see him in office next week if possible to talk about his bronchoscopy face to face and what are the other options for his coughing up blood.

## 2012-03-27 NOTE — Telephone Encounter (Signed)
I spoke with the pt and he states that he wanted to give Dr. Shelle Iron an update on how he is feeling. He states he is still having hemoptysis around three times a day. He states it is usually dark red blood. Pt also states he is still having SOB with activity such as walking or climbing stairs. Pt states this is not any worse, but it is no better. Pt is on coumadin. Pt had a bronch on 02-26-12. Pt wants to know what is the plan as far as treatment? Please advise.Carron Curie, CMA No Known Allergies

## 2012-03-30 ENCOUNTER — Ambulatory Visit (INDEPENDENT_AMBULATORY_CARE_PROVIDER_SITE_OTHER): Payer: Medicare Other | Admitting: Pulmonary Disease

## 2012-03-30 ENCOUNTER — Other Ambulatory Visit (INDEPENDENT_AMBULATORY_CARE_PROVIDER_SITE_OTHER): Payer: Medicare Other

## 2012-03-30 ENCOUNTER — Encounter: Payer: Self-pay | Admitting: Pulmonary Disease

## 2012-03-30 VITALS — BP 112/70 | HR 102 | Temp 97.5°F | Ht 75.0 in | Wt 281.6 lb

## 2012-03-30 DIAGNOSIS — J449 Chronic obstructive pulmonary disease, unspecified: Secondary | ICD-10-CM

## 2012-03-30 DIAGNOSIS — R042 Hemoptysis: Secondary | ICD-10-CM

## 2012-03-30 LAB — CBC
HCT: 42.8 % (ref 39.0–52.0)
Hemoglobin: 13.8 g/dL (ref 13.0–17.0)
RBC: 4.84 Mil/uL (ref 4.22–5.81)
WBC: 6.2 10*3/uL (ref 4.5–10.5)

## 2012-03-30 MED ORDER — CEFDINIR 300 MG PO CAPS: 600.0000 mg | ORAL_CAPSULE | ORAL | Status: DC

## 2012-03-30 NOTE — Progress Notes (Signed)
  Subjective:    Patient ID: Maurice Delgado, male    DOB: 02-02-1934, 78 y.o.   MRN: [id number]  HPI Patient comes in today for followup after his recent bronchoscopy for hemoptysis.  He did not have any endobronchial lesion, but was noted to have blood-tinged secretions coming from the superior segment of the left lower lobe.  BAL and bronchial brushes were done in this area, with no malignancy noted.  His cultures did grow strep pneumoniae that was mildly resistant, as well as methicillin sensitive staph aureus that was resistant to fluoroquinolones.  The patient has been having daily episodes of hemoptysis, primarily self limiting, but sometimes producing a quarter-sized clump of bright red blood.  He has also had some increasing shortness of breath, as well a significant weakness.  It should be noted that he is currently on Coumadin for his atrial arrhythmia.   Review of Systems  Constitutional: Positive for activity change. Negative for fever and unexpected weight change.  HENT: Negative for ear pain, nosebleeds, congestion, sore throat, rhinorrhea, sneezing, trouble swallowing, dental problem, postnasal drip and sinus pressure.   Eyes: Negative for redness and itching.  Respiratory: Positive for cough and shortness of breath. Negative for chest tightness and wheezing.        Hemoptysis  Cardiovascular: Negative for palpitations and leg swelling.  Gastrointestinal: Negative for nausea and vomiting.  Genitourinary: Negative for dysuria.  Musculoskeletal: Negative for joint swelling.  Skin: Negative for rash.  Neurological: Positive for dizziness and weakness. Negative for headaches.  Hematological: Does not bruise/bleed easily.  Psychiatric/Behavioral: Negative for dysphoric mood. The patient is not nervous/anxious.        Objective:   Physical Exam Overweight male in no acute distress Nose without purulence, discharge, or epistaxis noted Neck without lymphadenopathy or  thyromegaly Chest with adequate airflow, mild basilar crackles, no wheezing Cardiac exam with distant but regular heart sounds Lower extremities with mild edema, no cyanosis Alert and oriented, moves all 4 extremities.       Assessment & Plan:

## 2012-03-30 NOTE — Assessment & Plan Note (Signed)
The patient continues to have self limiting hemoptysis, and it is unclear how much of this is related to a persistent bacterial infection, and whether Coumadin at this time may be aggravating the situation.  I also cannot totally exclude with 100% certainty an endobronchial abnormality outside the view of the bronchoscope.  The patient is not having severe enough hemoptysis to consider lobectomy or embolization, but I wonder if he would benefit from a few weeks off anticoagulation.  I will discuss this with his cardiologist.  Will also check his CBC today for completeness given his symptoms of weakness.

## 2012-03-30 NOTE — Assessment & Plan Note (Signed)
The patient is not having any acute bronchospasm by exam, and is moving adequate air flow.  His weight is actually down a few pounds, and therefore I doubt volume overload is much of an issue for him currently.  He obviously is going to require oxygen, and will start him on that today.  Will also check a CBC as stated above.

## 2012-03-30 NOTE — Patient Instructions (Signed)
Will treat you with antibiotics again to see if quantity of blood decreases Will speak with Dr. Katrinka Blazing about coming off coumadin for a period of time to see if everything "settles down". Will check blood count today. Will start on oxygen at 2 liters with rest and sleep, and 3 liters with exertion. No change in breathing medications.

## 2012-04-01 ENCOUNTER — Telehealth: Payer: Self-pay | Admitting: Pulmonary Disease

## 2012-04-01 NOTE — Telephone Encounter (Signed)
Sorry, that is supposed to be one month.

## 2012-04-01 NOTE — Telephone Encounter (Signed)
Please let pt know that I spoke to Dr. Katrinka Blazing.  He is ok with stopping coumadin for one week to see if his bloody mucus improves.  I would like to see him back in office in 4 weeks.  Tell him to go ahead and stop coumadin now.  Thanks.

## 2012-04-01 NOTE — Telephone Encounter (Signed)
Pt was unavailable and the daughter said she would relay the msg regarding coumadin. Pt is stay off coumadin for 1 month per Dr. Shelle Iron and Dr. Katrinka Blazing.

## 2012-04-01 NOTE — Telephone Encounter (Signed)
The pt is scheduled for follow-up on 05/18/2012 with KC. Pt requested the appt be after 05/15/12 due to cruise. Pt states he spoke with Dr. Michaelle Copas office this morning and was told to stop the coumadin for 1 month, instead od, 1 week. KC, pls clarify instructions for coumadin.

## 2012-04-09 LAB — AFB CULTURE WITH SMEAR (NOT AT ARMC): Acid Fast Smear: NONE SEEN

## 2012-04-23 ENCOUNTER — Telehealth: Payer: Self-pay | Admitting: Pulmonary Disease

## 2012-04-23 NOTE — Telephone Encounter (Signed)
The pt states he is still coughing up mostly dark blood in the mornings but at times it is bright red. The amount of blood has not changed or gotten worse. Frequency may be slightly less. He denies any increased sob or chest pain but does wheeze on occasion. He said that both Dr. Shelle Iron and Dr. Katrinka Blazing had agreed that he stop his coumadin for 1 month to see if thisstopped . He wants to know what [facility] thinks he should do and stated that Dr. Michaelle Copas office preferred he call our office regarding this. The pt does not want an appt and doesn'ty feel he needs to be seen. He only wants recs from Dr. Shelle Iron and is aware that he will not be back in the office until tomorrow morning. Pt instructed to seek emergency help if his breathing becomes worse, has any chest pain or starts coughing up large amounts of bright red blood. Pt verbalized understanding. KC, pls advise.

## 2012-04-24 NOTE — Telephone Encounter (Signed)
Pt returned call - advised of KC's recs as stated below.  Pt verbalized his understanding.  Regarding his coumadin, pt has been off of this med for going on 4 weeks now and will be going out of town on 10.31.13.  Pt stated that he will call Dr Michaelle Copas and [facility] before then to see if needs to resume this medication.  As nothing is needed further at this time, will sign off.

## 2012-04-24 NOTE — Telephone Encounter (Signed)
LMTCB

## 2012-04-24 NOTE — Telephone Encounter (Signed)
Let him know that as long as it is a small quantity will just observe since we have looked in lungs and already treated him with antibiotics.  He is to let us know if getting worse.

## 2012-04-27 ENCOUNTER — Encounter: Payer: Medicare Other | Admitting: *Deleted

## 2012-05-05 ENCOUNTER — Encounter: Payer: Self-pay | Admitting: *Deleted

## 2012-05-18 ENCOUNTER — Encounter: Payer: Self-pay | Admitting: Pulmonary Disease

## 2012-05-18 ENCOUNTER — Ambulatory Visit (INDEPENDENT_AMBULATORY_CARE_PROVIDER_SITE_OTHER)
Admission: RE | Admit: 2012-05-18 | Discharge: 2012-05-18 | Disposition: A | Discharge status: Home / Self Care | Payer: Medicare Other | Source: Ambulatory Visit | Attending: Pulmonary Disease | Admitting: Pulmonary Disease

## 2012-05-18 ENCOUNTER — Encounter: Payer: Self-pay | Admitting: Internal Medicine

## 2012-05-18 ENCOUNTER — Ambulatory Visit (INDEPENDENT_AMBULATORY_CARE_PROVIDER_SITE_OTHER): Payer: Medicare Other | Admitting: *Deleted

## 2012-05-18 ENCOUNTER — Other Ambulatory Visit (INDEPENDENT_AMBULATORY_CARE_PROVIDER_SITE_OTHER): Payer: Medicare Other

## 2012-05-18 ENCOUNTER — Ambulatory Visit (INDEPENDENT_AMBULATORY_CARE_PROVIDER_SITE_OTHER): Payer: Medicare Other | Admitting: Pulmonary Disease

## 2012-05-18 VITALS — BP 140/96 | HR 86 | Temp 97.8°F | Ht 72.0 in | Wt 288.6 lb

## 2012-05-18 DIAGNOSIS — R042 Hemoptysis: Secondary | ICD-10-CM

## 2012-05-18 DIAGNOSIS — I519 Heart disease, unspecified: Secondary | ICD-10-CM

## 2012-05-18 DIAGNOSIS — J449 Chronic obstructive pulmonary disease, unspecified: Secondary | ICD-10-CM

## 2012-05-18 DIAGNOSIS — I428 Other cardiomyopathies: Secondary | ICD-10-CM

## 2012-05-18 LAB — BASIC METABOLIC PANEL
CO2: 30 mEq/L (ref 19–32)
Calcium: 9.4 mg/dL (ref 8.4–10.5)
Creatinine, Ser: 1.7 mg/dL — ABNORMAL HIGH (ref 0.4–1.5)
Glucose, Bld: 113 mg/dL — ABNORMAL HIGH (ref 70–99)

## 2012-05-18 MED ORDER — IOHEXOL 300 MG/ML  SOLN
80.0000 mL | Freq: Once | INTRAMUSCULAR | Status: AC | PRN
  Administered 2012-05-18: 75 mL via INTRAVENOUS

## 2012-05-18 NOTE — Progress Notes (Signed)
  Subjective:    Patient ID: Maurice Delgado, male    DOB: [birth year], 78 y.o.   MRN: [id number]  HPI Patient comes in today for followup of his known COPD and history of hemoptysis.  Despite being off Coumadin, he continues to have intermittent hemoptysis secondary from bright red to very dark.  He has not had significant quantities, but is fairly consistent.  He denies any chest congestion or purulent mucus.  He has had some increased shortness of breath.  He has had bronchoscopy with a negative airway exam except for heme staining from the superior segment of the left lower lobe.  It should be noted the patient also has a history of a cardiomyopathy.  He denies any pleuritic chest pain, and has a history of an IVC filter.  Of note, his weight is up 7 pounds from his visit 6 weeks ago.   Review of Systems  Constitutional: Negative for fever and unexpected weight change.  HENT: Negative for ear pain, nosebleeds, congestion, sore throat, rhinorrhea, sneezing, trouble swallowing, dental problem, postnasal drip and sinus pressure.   Eyes: Negative for redness and itching.  Respiratory: Positive for shortness of breath and wheezing. Negative for chest tightness. Cough:  cough blood  increased since last visit    Cardiovascular: Positive for leg swelling. Negative for palpitations.  Gastrointestinal: Negative for nausea and vomiting.  Genitourinary: Negative for dysuria.  Musculoskeletal: Negative for joint swelling.  Skin: Negative for rash.  Neurological: Negative for headaches.  Hematological: Does not bruise/bleed easily.  Psychiatric/Behavioral: Positive for dysphoric mood. The patient is nervous/anxious.        Objective:   Physical Exam Obese male in no acute distress Nose without purulence or evidence for epistaxis Oropharynx clear, with no evidence for bleeding Neck without JVD, lymphadenopathy, or thyromegaly Chest with bibasilar crackles one the way up bilaterally, no wheezing or  rhonchi Cardiac exam with questionably irregular rhythm, controlled ventricular response Lower extremities with 2+ edema, no cyanosis Alert and oriented, moves all 4 extremities.       Assessment & Plan:

## 2012-05-18 NOTE — Patient Instructions (Addendum)
You are supposed to be on oxygen 2 liters 24hrs/day, and increase to 3 liters with cpap during sleep Will schedule for a scan of your chest to re-evaluate your lungs since you are still coughing up blood.  Will call you with the results, and let you know.

## 2012-05-18 NOTE — Assessment & Plan Note (Addendum)
The patient has a history of moderate to severe COPD, but has been maintaining a reasonable baseline on his current bronchodilator regimen.  He has not been wearing his oxygen compliantly, and I stressed to him the importance of doing so.  There is nothing on exam today or by history to suggest a COPD exacerbation.

## 2012-05-18 NOTE — Assessment & Plan Note (Signed)
The patient continues to have intermittent hemoptysis that is mild to moderate in quantity.  I do not think it is being caused by an infection currently, but would consider whether he may have an occult malignancy, whether this may be from a distal endobronchial nonmalignant process, or whether it could possibly be related to high pulmonary pressures related to cardiomyopathy/heart failure.  It has not resolved since he has been off Coumadin.  He has had an airway exam with no specific etiology found.  At this point, I would like to check another CT of his chest to see if anything has manifested itself.

## 2012-05-20 LAB — REMOTE ICD DEVICE
AL IMPEDENCE ICD: 418 Ohm
AL THRESHOLD: 1 V
CHARGE TIME: 9.599 s
PACEART VT: 0
TOT-0001: 1
TOT-0002: 0
TZAT-0001ATACH: 1
TZAT-0001ATACH: 2
TZAT-0001FASTVT: 1
TZAT-0001SLOWVT: 1
TZAT-0002ATACH: NEGATIVE
TZAT-0002ATACH: NEGATIVE
TZAT-0002ATACH: NEGATIVE
TZAT-0002FASTVT: NEGATIVE
TZAT-0005SLOWVT: 88 pct
TZAT-0005SLOWVT: 91 pct
TZAT-0011SLOWVT: 10 ms
TZAT-0011SLOWVT: 10 ms
TZAT-0012ATACH: 150 ms
TZAT-0012ATACH: 150 ms
TZAT-0012SLOWVT: 200 ms
TZAT-0012SLOWVT: 200 ms
TZAT-0013SLOWVT: 2
TZAT-0013SLOWVT: 2
TZAT-0018ATACH: NEGATIVE
TZAT-0018ATACH: NEGATIVE
TZAT-0018ATACH: NEGATIVE
TZAT-0018SLOWVT: NEGATIVE
TZAT-0018SLOWVT: NEGATIVE
TZAT-0019ATACH: 6 V
TZAT-0019ATACH: 6 V
TZAT-0019FASTVT: 8 V
TZAT-0020ATACH: 1.5 ms
TZON-0003ATACH: 350 ms
TZON-0003SLOWVT: 350 ms
TZON-0004SLOWVT: 24
TZON-0005SLOWVT: 12
TZST-0001ATACH: 4
TZST-0001ATACH: 5
TZST-0001FASTVT: 3
TZST-0001SLOWVT: 4
TZST-0001SLOWVT: 6
TZST-0002ATACH: NEGATIVE
TZST-0002ATACH: NEGATIVE
TZST-0002FASTVT: NEGATIVE
TZST-0002FASTVT: NEGATIVE
TZST-0002FASTVT: NEGATIVE
TZST-0003SLOWVT: 35 J
VENTRICULAR PACING ICD: 98.08 pct

## 2012-05-25 ENCOUNTER — Telehealth: Payer: Self-pay | Admitting: Pulmonary Disease

## 2012-05-25 ENCOUNTER — Telehealth: Payer: Self-pay | Admitting: *Deleted

## 2012-05-25 DIAGNOSIS — J449 Chronic obstructive pulmonary disease, unspecified: Secondary | ICD-10-CM

## 2012-05-25 MED ORDER — METOPROLOL SUCCINATE ER 50 MG PO TB24: 50.0000 mg | ORAL_TABLET | Freq: Every day | ORAL | Status: DC

## 2012-05-25 NOTE — Telephone Encounter (Signed)
After talking to pts cardiologist, will try to diurese pt and see if breathing and hemoptysis improves. Asked him to take lasix 40mg   2 in am and one in pm until he hears from me.  Will check BMET end of week and make adjustments.  Please put in bmet for pt for this Friday am (11/29) Thanks. I have already discussed with pt.

## 2012-05-25 NOTE — Telephone Encounter (Signed)
BMET ordered. Will check with our lab to make sure they are open on Fri., 05/29/12.

## 2012-05-25 NOTE — Telephone Encounter (Signed)
Spoke w/pt in regards to change in medication---per Dr Johney Frame changed Metoprolol 50mg  from 1/2 tablet qd to 1 tablet qd. Pt aware of this and new RX was sent to [facility] by Delsa Grana.

## 2012-05-26 NOTE — Telephone Encounter (Signed)
Per Vicky in the [place] lab, they will be open on Fri., 05/29/12.

## 2012-06-01 ENCOUNTER — Telehealth: Payer: Self-pay | Admitting: Pulmonary Disease

## 2012-06-01 NOTE — Telephone Encounter (Signed)
Make sure he got his bmet as well as I ordered.  Do not see results in computer.

## 2012-06-01 NOTE — Telephone Encounter (Signed)
I discussed his pee pill with him on 11/25.  I suspect he is listening to voice mail that was old.

## 2012-06-01 NOTE — Telephone Encounter (Signed)
ATC patient, line busy.  WCB.

## 2012-06-01 NOTE — Telephone Encounter (Signed)
Spoke with patient, states he is returning a call from Dr. Shelle Iron about changing medications.  States Dr. Shelle Iron said he was going to call him Fri 05/29/12 after 2pm and did but patient was unable to reach the phone.I can not determine what exactly,  Dr. Shelle Iron please advise, thank you.

## 2012-06-01 NOTE — Telephone Encounter (Signed)
Yes I saw this note, however patient said he received a call Friday 11/29. Spoke with patient again, patient insists that he received a call, says Dr. Shelle Iron stated he would speak with Dr. Katrinka Blazing about his "pee pill." But has not heard anything as of yet. Please advise

## 2012-06-01 NOTE — Telephone Encounter (Signed)
See my note 05/25/2012.  I have already talked with him about this.  I don't think I called him and said anything about calling Friday?????

## 2012-06-02 NOTE — Telephone Encounter (Signed)
Pt returned call. Maurice Delgado °

## 2012-06-02 NOTE — Telephone Encounter (Signed)
Called, spoke with pt.  I informed him of below per [facility].  He verbalized understanding of this but still questioning why KC did not call him on Friday regarding what to do with the lasix.  I asked pt if he came in for BMET on Friday -- pt states he did not because he didn't know he was supposed to do this.  Pt states he would not have agreed to come in on Friday for lab work because that is his "day of prayer."  I asked pt to come in asap to have this lab work done so [facility] can make adjustments to the medication accordingly.  He then stated he was unsure if he was taking the "water pill" 2 in the am and 1 in the pm on a few days during the increase because it didn't work "like it usually does."  Pt states at least 1-2 of the days during this increase, he believes he had increased the metoprolol by accident instead of the "water pill."  States he is now sure he is taking the water pill 2 in am and 1 in the pm and requesting to come in on Thursday for labs because he wants to make sure he has been on the increased water pill dose long enough.  Dr. Shelle Iron, pls advise if Thursday is ok with you and if you have any further recs at this point.  Thank you.

## 2012-06-02 NOTE — Telephone Encounter (Signed)
Spoke with pt and advised Thurs is fine, he verbalized understanding and states nothing further needed.

## 2012-06-02 NOTE — Telephone Encounter (Signed)
lmtcb x1 w/ male that answered the phone

## 2012-06-02 NOTE — Telephone Encounter (Signed)
Thurs is ok Let him know he asked what time the lab opened on Friday, because he could come in early before starting prayers.  This will jog his memory.

## 2012-06-03 ENCOUNTER — Ambulatory Visit: Payer: Self-pay | Admitting: Pulmonary Disease

## 2012-06-04 ENCOUNTER — Other Ambulatory Visit (INDEPENDENT_AMBULATORY_CARE_PROVIDER_SITE_OTHER): Payer: Medicare Other

## 2012-06-04 DIAGNOSIS — J449 Chronic obstructive pulmonary disease, unspecified: Secondary | ICD-10-CM

## 2012-06-04 LAB — BASIC METABOLIC PANEL
BUN: 26 mg/dL — ABNORMAL HIGH (ref 6–23)
CO2: 35 mEq/L — ABNORMAL HIGH (ref 19–32)
Calcium: 9.6 mg/dL (ref 8.4–10.5)
Chloride: 100 mEq/L (ref 96–112)
Creatinine, Ser: 1.9 mg/dL — ABNORMAL HIGH (ref 0.4–1.5)
Glucose, Bld: 113 mg/dL — ABNORMAL HIGH (ref 70–99)

## 2012-06-09 ENCOUNTER — Ambulatory Visit (INDEPENDENT_AMBULATORY_CARE_PROVIDER_SITE_OTHER): Payer: Medicare Other | Admitting: Pulmonary Disease

## 2012-06-09 ENCOUNTER — Other Ambulatory Visit (INDEPENDENT_AMBULATORY_CARE_PROVIDER_SITE_OTHER): Payer: Medicare Other

## 2012-06-09 ENCOUNTER — Encounter: Payer: Self-pay | Admitting: Pulmonary Disease

## 2012-06-09 VITALS — BP 108/62 | HR 73 | Temp 98.0°F | Ht 75.0 in | Wt 288.2 lb

## 2012-06-09 DIAGNOSIS — N19 Unspecified kidney failure: Secondary | ICD-10-CM

## 2012-06-09 DIAGNOSIS — R042 Hemoptysis: Secondary | ICD-10-CM

## 2012-06-09 DIAGNOSIS — R0602 Shortness of breath: Secondary | ICD-10-CM

## 2012-06-09 DIAGNOSIS — J449 Chronic obstructive pulmonary disease, unspecified: Secondary | ICD-10-CM

## 2012-06-09 LAB — BASIC METABOLIC PANEL
BUN: 26 mg/dL — ABNORMAL HIGH (ref 6–23)
Calcium: 9.4 mg/dL (ref 8.4–10.5)
Creatinine, Ser: 1.8 mg/dL — ABNORMAL HIGH (ref 0.4–1.5)
GFR: 47.92 mL/min — ABNORMAL LOW (ref >60.00)
Glucose, Bld: 105 mg/dL — ABNORMAL HIGH (ref 70–99)
Potassium: 3.5 mEq/L (ref 3.5–5.1)

## 2012-06-09 MED ORDER — AMOXICILLIN-POT CLAVULANATE 875-125 MG PO TABS: 1.0000 | ORAL_TABLET | Freq: Two times a day (BID) | ORAL | Status: DC

## 2012-06-09 NOTE — Progress Notes (Signed)
  Subjective:    Patient ID: Maurice Delgado, male    DOB: 09/15/1933, 78 y.o.   MRN: [id number]  HPI The patient comes in today for followup of his worsening dyspnea with hypoxemia, accompanied by hemoptysis.  He has been a very difficult case, with very little found on bronchoscopy, but his most recent CT showed bilateral infiltrates the radiologist felt was more consistent with edema than anything else.  We have tried to increase his diuretics over the last few weeks, but this was made more difficult by the patient's confusion over his beta blocker versus his diuretic.  He did ultimately increase his Lasix to 80 mg in the morning and 40 mg in the afternoon, but there was some increase in his BUN and creatinine.  This was cut back to 40 mg in the morning and evening, and he was asked to followup today.  Unfortunately, his weight has not changed since the previous visit, which raises the question whether we have really diurese him anymore than before.  This is all complicated by his chronic renal insufficiency.  He has continued to cough up rust/blood-tinged mucus, but no bright red blood.  He has produced a specimen in the office today, and he feels that he is getting more congested like an episode of bronchitis.  He continues to require high flow oxygen.   Review of Systems  Constitutional: Negative for fever and unexpected weight change.  HENT: Negative for ear pain, nosebleeds, congestion, sore throat, rhinorrhea, sneezing, trouble swallowing, dental problem, postnasal drip and sinus pressure.   Eyes: Negative for redness and itching.  Respiratory: Positive for cough ( hemoptysis) and shortness of breath. Negative for chest tightness and wheezing.   Cardiovascular: Negative for palpitations and leg swelling.  Gastrointestinal: Negative for nausea and vomiting.  Genitourinary: Negative for dysuria.  Musculoskeletal: Negative for joint swelling.  Skin: Negative for rash.  Neurological: Negative  for headaches.  Hematological: Does not bruise/bleed easily.  Psychiatric/Behavioral: Negative for dysphoric mood. The patient is not nervous/anxious.        Objective:   Physical Exam Overweight male in no acute distress Nose without purulence or discharge noted Oropharynx clear Neck supple without JVD or lymphadenopathy Chest with basilar crackles bilaterally, no wheezing Cardiac exam with what sounds regular. Lower extremities with 1-2+ edema, no cyanosis Alert and oriented, moves all 4 extremities.       Assessment & Plan:

## 2012-06-09 NOTE — Assessment & Plan Note (Signed)
The patient has continued to cough up rust-colored/blood-tinged mucus, and now based on his history I am wondering if this is more of a bronchitis.  I will send his mucus again for culture, and start him empirically on an antibiotic.  He has proven more difficult to diurese because of his underlying renal insufficiency, and I am not sure that we have given him a fair shot to see if he will improve.  We'll check his bmet today.  The other thought is whether his infiltrates on chest CT could be related to pulmonary hemorrhage, however his vasculitis workup with the renal service was unremarkable.  Finally, I think we need to at least give some consideration to the possibility of thromboembolic disease.  He has an IVC filter in place, and has been on anti-coagulation, but this does not 100% exclude the possibility of thromboembolic disease.  We'll consider doing a ventilation/perfusion scan, or possibly an IVC gram.  For now, let us see how he does on a course of antibiotics, and will followup his cultures.

## 2012-06-09 NOTE — Patient Instructions (Addendum)
Will send your sputum for culture In the meantime, because of your congestion and mucus, will treat with augmentin 875mg  one in am and pm for next 10 days. Will check your kidney function today, and will call with results tomorrow on how to adjust fluid medication. Will arrange followup once I get your labwork and culture back. Will discuss your case with Dr's Katrinka Blazing and Deterding.

## 2012-06-10 ENCOUNTER — Telehealth: Payer: Self-pay | Admitting: Pulmonary Disease

## 2012-06-10 MED ORDER — AMOXICILLIN-POT CLAVULANATE 875-125 MG PO TABS: 1.0000 | ORAL_TABLET | Freq: Two times a day (BID) | ORAL | Status: DC

## 2012-06-10 NOTE — Telephone Encounter (Signed)
[facility] and had this cancelled. Pt is aware rx has been called into the pharmacy

## 2012-06-10 NOTE — Telephone Encounter (Signed)
Notes Recorded by Barbaraann Share, MD on 06/09/2012 at 6:13 PM Let pt know that his bloodwork is ok. Stay on fluid pill one in am and pm for now. Will call him with culture results once available.      Pt notified of blood work results per Dr. Shelle Iron and verbalized understanding. We will call with culture results when available.

## 2012-06-14 LAB — RESPIRATORY CULTURE OR RESPIRATORY AND SPUTUM CULTURE: Gram Stain: NONE SEEN

## 2012-07-22 ENCOUNTER — Inpatient Hospital Stay (HOSPITAL_COMMUNITY)
Admission: EM | Admit: 2012-07-22 | Discharge: 2012-07-27 | DRG: 194 | Disposition: A | Discharge status: Home / Self Care | Payer: Medicare Other | Attending: Family Medicine | Admitting: Family Medicine

## 2012-07-22 ENCOUNTER — Emergency Department (HOSPITAL_COMMUNITY): Payer: Medicare Other

## 2012-07-22 ENCOUNTER — Inpatient Hospital Stay (HOSPITAL_COMMUNITY): Payer: Medicare Other

## 2012-07-22 ENCOUNTER — Encounter (HOSPITAL_COMMUNITY): Payer: Self-pay | Admitting: Emergency Medicine

## 2012-07-22 DIAGNOSIS — I4891 Unspecified atrial fibrillation: Secondary | ICD-10-CM | POA: Diagnosis present

## 2012-07-22 DIAGNOSIS — Z7982 Long term (current) use of aspirin: Secondary | ICD-10-CM

## 2012-07-22 DIAGNOSIS — Z79899 Other long term (current) drug therapy: Secondary | ICD-10-CM

## 2012-07-22 DIAGNOSIS — N183 Chronic kidney disease, stage 3 unspecified: Secondary | ICD-10-CM | POA: Diagnosis present

## 2012-07-22 DIAGNOSIS — Z95810 Presence of automatic (implantable) cardiac defibrillator: Secondary | ICD-10-CM

## 2012-07-22 DIAGNOSIS — J441 Chronic obstructive pulmonary disease with (acute) exacerbation: Secondary | ICD-10-CM

## 2012-07-22 DIAGNOSIS — I129 Hypertensive chronic kidney disease with stage 1 through stage 4 chronic kidney disease, or unspecified chronic kidney disease: Secondary | ICD-10-CM | POA: Diagnosis present

## 2012-07-22 DIAGNOSIS — Z86718 Personal history of other venous thrombosis and embolism: Secondary | ICD-10-CM

## 2012-07-22 DIAGNOSIS — Z87891 Personal history of nicotine dependence: Secondary | ICD-10-CM

## 2012-07-22 DIAGNOSIS — I509 Heart failure, unspecified: Secondary | ICD-10-CM | POA: Diagnosis present

## 2012-07-22 DIAGNOSIS — G4733 Obstructive sleep apnea (adult) (pediatric): Secondary | ICD-10-CM | POA: Diagnosis present

## 2012-07-22 DIAGNOSIS — M109 Gout, unspecified: Secondary | ICD-10-CM | POA: Diagnosis present

## 2012-07-22 DIAGNOSIS — J189 Pneumonia, unspecified organism: Principal | ICD-10-CM

## 2012-07-22 DIAGNOSIS — J4489 Other specified chronic obstructive pulmonary disease: Secondary | ICD-10-CM | POA: Diagnosis present

## 2012-07-22 DIAGNOSIS — N19 Unspecified kidney failure: Secondary | ICD-10-CM

## 2012-07-22 DIAGNOSIS — R55 Syncope and collapse: Secondary | ICD-10-CM | POA: Diagnosis present

## 2012-07-22 DIAGNOSIS — I499 Cardiac arrhythmia, unspecified: Secondary | ICD-10-CM

## 2012-07-22 DIAGNOSIS — I5022 Chronic systolic (congestive) heart failure: Secondary | ICD-10-CM | POA: Diagnosis present

## 2012-07-22 DIAGNOSIS — R042 Hemoptysis: Secondary | ICD-10-CM | POA: Diagnosis present

## 2012-07-22 DIAGNOSIS — Z8249 Family history of ischemic heart disease and other diseases of the circulatory system: Secondary | ICD-10-CM

## 2012-07-22 DIAGNOSIS — I428 Other cardiomyopathies: Secondary | ICD-10-CM | POA: Diagnosis present

## 2012-07-22 DIAGNOSIS — I519 Heart disease, unspecified: Secondary | ICD-10-CM | POA: Diagnosis present

## 2012-07-22 DIAGNOSIS — J449 Chronic obstructive pulmonary disease, unspecified: Secondary | ICD-10-CM

## 2012-07-22 LAB — URINALYSIS, ROUTINE W REFLEX MICROSCOPIC
Glucose, UA: NEGATIVE mg/dL
Ketones, ur: NEGATIVE mg/dL
Leukocytes, UA: NEGATIVE
Protein, ur: NEGATIVE mg/dL

## 2012-07-22 LAB — COMPREHENSIVE METABOLIC PANEL
Alkaline Phosphatase: 83 U/L (ref 39–117)
BUN: 17 mg/dL (ref 6–23)
GFR calc Af Amer: 47 mL/min — ABNORMAL LOW (ref >90)
Glucose, Bld: 126 mg/dL — ABNORMAL HIGH (ref 70–99)
Potassium: 3.6 mEq/L (ref 3.5–5.1)
Total Protein: 6.5 g/dL (ref 6.0–8.3)

## 2012-07-22 LAB — CBC
Hemoglobin: 13.1 g/dL (ref 13.0–17.0)
Platelets: 108 10*3/uL — ABNORMAL LOW (ref 150–400)
RBC: 4.78 MIL/uL (ref 4.22–5.81)
WBC: 11.2 10*3/uL — ABNORMAL HIGH (ref 4.0–10.5)

## 2012-07-22 LAB — CBC WITH DIFFERENTIAL/PLATELET
Basophils Relative: 0 % (ref 0–1)
HCT: 42 % (ref 39.0–52.0)
Hemoglobin: 13.8 g/dL (ref 13.0–17.0)
Lymphocytes Relative: 6 % — ABNORMAL LOW (ref 12–46)
MCHC: 32.9 g/dL (ref 30.0–36.0)
Monocytes Absolute: 0.6 10*3/uL (ref 0.1–1.0)
Monocytes Relative: 7 % (ref 3–12)
Neutro Abs: 7.9 10*3/uL — ABNORMAL HIGH (ref 1.7–7.7)

## 2012-07-22 LAB — PRO B NATRIURETIC PEPTIDE: Pro B Natriuretic peptide (BNP): 4025 pg/mL — ABNORMAL HIGH (ref 0–450)

## 2012-07-22 LAB — TROPONIN I: Troponin I: <0.3 ng/mL (ref <0.30)

## 2012-07-22 LAB — PROTIME-INR: Prothrombin Time: 14.2 seconds (ref 11.6–15.2)

## 2012-07-22 LAB — URINE MICROSCOPIC-ADD ON

## 2012-07-22 MED ORDER — SODIUM CHLORIDE 0.9 % IV SOLN: 250.0000 mL | INTRAVENOUS | Status: DC | PRN

## 2012-07-22 MED ORDER — SODIUM CHLORIDE 0.9 % IJ SOLN
3.0000 mL | Freq: Two times a day (BID) | INTRAMUSCULAR | Status: DC
  Administered 2012-07-23 – 2012-07-24 (×2): 3 mL via INTRAVENOUS

## 2012-07-22 MED ORDER — ONDANSETRON HCL 4 MG PO TABS: 4.0000 mg | ORAL_TABLET | Freq: Four times a day (QID) | ORAL | Status: DC | PRN

## 2012-07-22 MED ORDER — LEVALBUTEROL HCL 0.63 MG/3ML IN NEBU
0.6300 mg | INHALATION_SOLUTION | Freq: Four times a day (QID) | RESPIRATORY_TRACT | Status: DC
  Administered 2012-07-22: 0.63 mg via RESPIRATORY_TRACT
  Filled 2012-07-22 (×7): qty 3

## 2012-07-22 MED ORDER — FUROSEMIDE 40 MG PO TABS
40.0000 mg | ORAL_TABLET | Freq: Every day | ORAL | Status: DC
  Administered 2012-07-22 – 2012-07-23 (×2): 40 mg via ORAL
  Filled 2012-07-22 (×2): qty 1

## 2012-07-22 MED ORDER — POTASSIUM CHLORIDE CRYS ER 20 MEQ PO TBCR
40.0000 meq | EXTENDED_RELEASE_TABLET | Freq: Once | ORAL | Status: AC
  Administered 2012-07-22: 40 meq via ORAL
  Filled 2012-07-22: qty 2

## 2012-07-22 MED ORDER — ACETAMINOPHEN 325 MG PO TABS: 650.0000 mg | ORAL_TABLET | Freq: Four times a day (QID) | ORAL | Status: DC | PRN

## 2012-07-22 MED ORDER — ENOXAPARIN SODIUM 80 MG/0.8ML ~~LOC~~ SOLN
0.5000 mg/kg | SUBCUTANEOUS | Status: DC
  Administered 2012-07-22 – 2012-07-26 (×5): 65 mg via SUBCUTANEOUS
  Filled 2012-07-22 (×6): qty 0.8

## 2012-07-22 MED ORDER — FUROSEMIDE 10 MG/ML IJ SOLN
40.0000 mg | Freq: Once | INTRAMUSCULAR | Status: AC
  Administered 2012-07-22: 40 mg via INTRAVENOUS
  Filled 2012-07-22: qty 4

## 2012-07-22 MED ORDER — SODIUM CHLORIDE 0.9 % IJ SOLN: 3.0000 mL | INTRAMUSCULAR | Status: DC | PRN

## 2012-07-22 MED ORDER — TAMSULOSIN HCL 0.4 MG PO CAPS
0.4000 mg | ORAL_CAPSULE | Freq: Every day | ORAL | Status: DC
  Administered 2012-07-22 – 2012-07-27 (×6): 0.4 mg via ORAL
  Filled 2012-07-22 (×7): qty 1

## 2012-07-22 MED ORDER — VANCOMYCIN HCL 10 G IV SOLR
1750.0000 mg | INTRAVENOUS | Status: DC
  Administered 2012-07-23 – 2012-07-26 (×4): 1750 mg via INTRAVENOUS
  Filled 2012-07-22 (×5): qty 1750

## 2012-07-22 MED ORDER — VANCOMYCIN HCL 10 G IV SOLR
2500.0000 mg | Freq: Once | INTRAVENOUS | Status: AC
  Administered 2012-07-22: 2500 mg via INTRAVENOUS
  Filled 2012-07-22: qty 2500

## 2012-07-22 MED ORDER — ACETAMINOPHEN 650 MG RE SUPP: 650.0000 mg | Freq: Four times a day (QID) | RECTAL | Status: DC | PRN

## 2012-07-22 MED ORDER — TIOTROPIUM BROMIDE MONOHYDRATE 18 MCG IN CAPS: 18.0000 ug | ORAL_CAPSULE | Freq: Every day | RESPIRATORY_TRACT | Status: DC

## 2012-07-22 MED ORDER — LEVOFLOXACIN IN D5W 750 MG/150ML IV SOLN
750.0000 mg | INTRAVENOUS | Status: DC
  Administered 2012-07-22 – 2012-07-27 (×6): 750 mg via INTRAVENOUS
  Filled 2012-07-22 (×8): qty 150

## 2012-07-22 MED ORDER — ONDANSETRON HCL 4 MG/2ML IJ SOLN: 4.0000 mg | Freq: Four times a day (QID) | INTRAMUSCULAR | Status: DC | PRN

## 2012-07-22 MED ORDER — LEVALBUTEROL HCL 0.63 MG/3ML IN NEBU
0.6300 mg | INHALATION_SOLUTION | RESPIRATORY_TRACT | Status: DC | PRN
  Filled 2012-07-22: qty 3

## 2012-07-22 MED ORDER — DIGOXIN 125 MCG PO TABS
125.0000 ug | ORAL_TABLET | Freq: Every day | ORAL | Status: DC
  Administered 2012-07-22 – 2012-07-27 (×6): 125 ug via ORAL
  Filled 2012-07-22 (×6): qty 1

## 2012-07-22 MED ORDER — CALCITRIOL 0.5 MCG PO CAPS
0.5000 ug | ORAL_CAPSULE | ORAL | Status: DC
  Administered 2012-07-22 – 2012-07-26 (×3): 0.5 ug via ORAL
  Filled 2012-07-22 (×3): qty 1

## 2012-07-22 MED ORDER — ASPIRIN 81 MG PO CHEW
81.0000 mg | CHEWABLE_TABLET | Freq: Every day | ORAL | Status: DC
  Administered 2012-07-22 – 2012-07-27 (×6): 81 mg via ORAL
  Filled 2012-07-22 (×6): qty 1

## 2012-07-22 MED ORDER — ENOXAPARIN SODIUM 40 MG/0.4ML ~~LOC~~ SOLN: 40.0000 mg | SUBCUTANEOUS | Status: DC

## 2012-07-22 MED ORDER — LEVALBUTEROL TARTRATE 45 MCG/ACT IN AERO: 2.0000 | INHALATION_SPRAY | Freq: Four times a day (QID) | RESPIRATORY_TRACT | Status: DC | PRN

## 2012-07-22 MED ORDER — TIOTROPIUM BROMIDE MONOHYDRATE 18 MCG IN CAPS
18.0000 ug | ORAL_CAPSULE | Freq: Every day | RESPIRATORY_TRACT | Status: DC
  Administered 2012-07-22 – 2012-07-27 (×4): 18 ug via RESPIRATORY_TRACT
  Filled 2012-07-22: qty 5

## 2012-07-22 MED ORDER — PREGABALIN 50 MG PO CAPS
150.0000 mg | ORAL_CAPSULE | Freq: Two times a day (BID) | ORAL | Status: DC
  Administered 2012-07-22 – 2012-07-27 (×10): 150 mg via ORAL
  Filled 2012-07-22 (×7): qty 1
  Filled 2012-07-22: qty 2
  Filled 2012-07-22 (×3): qty 1

## 2012-07-22 MED ORDER — SODIUM CHLORIDE 0.9 % IJ SOLN
3.0000 mL | Freq: Two times a day (BID) | INTRAMUSCULAR | Status: DC
  Administered 2012-07-24 – 2012-07-26 (×6): 3 mL via INTRAVENOUS

## 2012-07-22 MED ORDER — METOPROLOL SUCCINATE ER 50 MG PO TB24
50.0000 mg | ORAL_TABLET | Freq: Every day | ORAL | Status: DC
  Administered 2012-07-22 – 2012-07-24 (×3): 50 mg via ORAL
  Filled 2012-07-22 (×3): qty 1

## 2012-07-22 MED ORDER — HYDROCOD POLST-CHLORPHEN POLST 10-8 MG/5ML PO LQCR
5.0000 mL | Freq: Two times a day (BID) | ORAL | Status: DC
  Administered 2012-07-22 – 2012-07-27 (×11): 5 mL via ORAL
  Filled 2012-07-22 (×11): qty 5

## 2012-07-22 MED ORDER — FEBUXOSTAT 40 MG PO TABS
80.0000 mg | ORAL_TABLET | Freq: Every day | ORAL | Status: DC
  Administered 2012-07-22 – 2012-07-27 (×6): 80 mg via ORAL
  Filled 2012-07-22 (×6): qty 2

## 2012-07-22 NOTE — ED Notes (Signed)
MD Pickering at bedside.  

## 2012-07-22 NOTE — ED Notes (Signed)
Pt reporting mild dizziness. Denies blurred vision. Pt AO x 4. PEERLA.

## 2012-07-22 NOTE — ED Notes (Signed)
Levaquin started after blood cultures started.

## 2012-07-22 NOTE — ED Notes (Signed)
Care transferred and report given to [place], [place].

## 2012-07-22 NOTE — H&P (Signed)
[facility] History and Physical  [name] ZOX:[id number] DOB: 19-May-1934 DOA: 07/22/2012  Referring physician: ER  PCP: [name], MD   Chief Complaint: Loss of Consciousness , fever   HPI:  79 y/o AAM with PMH of COPD, OSA on CPAP, recurrent DVT s/p IVC filter, Afib -not on anticoagulation due to chronic hemoptysis, NICM with EF 35% s/p BiV ICD, chronic renal insufficiency who presented to [facility] ER with two episodes of syncope. Reports he woke this am and attempted to walk to restroom but passed out near the bed. Wife activated 911. On arrival, he tried to walk to ambulance and made it downstairs but had second episode of syncope at bottom of stairs. Did not have warning prior to events. Reports he has been more short of breath than usual, chronic hemoptysis seems to be worse in am but no real change in volume. Hx of bronch with Dr. Shelle Iron and LLL has been culprit for bleeding. Specifically denies chest pain, pain with inspiration, nausea, vomiting, diarrhea, blood in stool, dark stool. Chronic hemoptysis. PCCM consulted for hemoptysis.         Review of Systems: negative for the following  Constitutional: Denies fever, chills, diaphoresis, appetite change and fatigue.  HEENT: Denies photophobia, eye pain, redness, hearing loss, ear pain, congestion, sore throat, rhinorrhea, sneezing, mouth sores, trouble swallowing, neck pain, neck stiffness and tinnitus.  Respiratory: Denies SOB, DOE, cough, chest tightness, and wheezing.  Cardiovascular: Denies chest pain, palpitations and leg swelling.  Gastrointestinal: Denies nausea, vomiting, abdominal pain, diarrhea, constipation, blood in stool and abdominal distention.  Genitourinary: Denies dysuria, urgency, frequency, hematuria, flank pain and difficulty urinating.  Musculoskeletal: Denies myalgias, back pain, joint swelling, arthralgias and gait problem.  Skin: Denies pallor, rash and wound.  Neurological: Denies  dizziness, seizures, syncope, weakness, light-headedness, numbness and headaches.  Hematological: Denies adenopathy. Easy bruising, personal or family bleeding history  Psychiatric/Behavioral: Denies suicidal ideation, mood changes, confusion, nervousness, sleep disturbance and agitation       Past Medical History  Diagnosis Date  . Nonischemic cardiomyopathy     with EF 35%, s/p CRT-D device implanted  . Atrial fibrillation   . Unspecified sleep apnea   . HTN (hypertension)   . DVT (deep venous thrombosis)     recurrent. s/p IVC filter.   . Renal insufficiency   . Gout   . COPD (chronic obstructive pulmonary disease)   . CHF (congestive heart failure)      Past Surgical History  Procedure Date  . Cardiac defibrillator placement     initial BiV ICD 09/22/00 in [place].  Gen change (MDT) by Dr Amil Amen 10/30/09  . Pacemaker insertion   . Video bronchoscopy 02/26/2012    Procedure: VIDEO BRONCHOSCOPY WITHOUT FLUORO;  Surgeon: Barbaraann Share, MD;  Location: [facility];  Service: Cardiopulmonary;  Laterality: Bilateral;      Social History:  reports that he quit smoking about 29 years ago. His smoking use included Cigarettes. He has a 45 pack-year smoking history. He does not have any smokeless tobacco history on file. He reports that he does not drink alcohol or use illicit drugs.   No Known Allergies  Family History  Problem Relation Age of Onset  . Heart disease Mother     mother also had Rheumatism.     Prior to Admission medications   Medication Sig Start Date End Date Taking? Authorizing Provider  Ascorbic Acid (VITAMIN C) 1000 MG tablet Take 1,000 mg by mouth daily.  Yes Historical Provider, MD  aspirin 81 MG tablet Take 81 mg by mouth daily.     Yes Historical Provider, MD  calcitRIOL (ROCALTROL) 0.5 MCG capsule Take 0.5 mcg by mouth every other day.    Yes Historical Provider, MD  digoxin (LANOXIN) 0.125 MG tablet Take 125 mcg by mouth daily.     Yes Historical  Provider, MD  Febuxostat (ULORIC) 80 MG TABS Take 80 mg by mouth daily.    Yes Historical Provider, MD  furosemide (LASIX) 40 MG tablet Take 40 mg by mouth daily.    Yes Historical Provider, MD  levalbuterol [facility] HFA) 45 MCG/ACT inhaler Inhale 2 puffs into the lungs every 6 (six) hours as needed.     Yes Historical Provider, MD  metoprolol succinate (TOPROL-XL) 50 MG 24 hr tablet Take 1 tablet (50 mg total) by mouth daily. 05/25/12  Yes Hillis Range, MD  pregabalin (LYRICA) 150 MG capsule Take 150 mg by mouth 2 (two) times daily.     Yes Historical Provider, MD  Tamsulosin HCl (FLOMAX) 0.4 MG CAPS Take 0.4 mg by mouth daily.     Yes Historical Provider, MD  tiotropium (SPIRIVA HANDIHALER) 18 MCG inhalation capsule Place 18 mcg into inhaler and inhale daily.     Yes Historical Provider, MD     Physical Exam: Filed Vitals:   07/22/12 1109 07/22/12 1115 07/22/12 1500  BP: 122/82 107/60 134/68  Pulse: 103 96 89  Temp: 101.6 F (38.7 C)    TempSrc: Oral    Resp: 20 18 20   Height: 6\' 2"  (1.88 m)    Weight: 127.007 kg (280 lb)    SpO2: 88% 82% 96%     Constitutional: Vital signs reviewed. Patient is a well-developed and well-nourished in no acute distress and cooperative with exam. Alert and oriented x3.  Head: Normocephalic and atraumatic  Ear: TM normal bilaterally  Mouth: no erythema or exudates, MMM  Eyes: PERRL, EOMI, conjunctivae normal, No scleral icterus.  Neck: Supple, Trachea midline normal ROM, No JVD, mass, thyromegaly, or carotid bruit present.  Cardiovascular: RRR, S1 normal, S2 normal, no MRG, pulses symmetric and intact bilaterally  Pulmonary/Chest: CTAB, no wheezes, bilateral faint crackles Abdominal: Soft. Non-tender, non-distended, bowel sounds are normal, no masses, organomegaly, or guarding present.  GU: no CVA tenderness Musculoskeletal: No joint deformities, erythema, or stiffness, ROM full and no nontender Ext: no edema and no cyanosis, pulses palpable  bilaterally (DP and PT)  Hematology: no cervical, inginal, or axillary adenopathy.  Neurological: A&O x3, Strenght is normal and symmetric bilaterally, cranial nerve II-XII are grossly intact, no focal motor deficit, sensory intact to light touch bilaterally.  Skin: Warm, dry and intact. No rash, cyanosis, or clubbing.  Psychiatric: Normal mood and affect. speech and behavior is normal. Judgment and thought content normal. Cognition and memory are normal.       Labs on Admission:    Basic Metabolic Panel:  Lab 07/22/12 4098  NA 140  K 3.6  CL 102  CO2 27  GLUCOSE 126*  BUN 17  CREATININE 1.59*  CALCIUM 9.2  MG --  PHOS --   Liver Function Tests:  Lab 07/22/12 1134  AST 20  ALT 15  ALKPHOS 83  BILITOT 1.1  PROT 6.5  ALBUMIN 3.2*   No results found for this basename: LIPASE:5,AMYLASE:5 in the last 168 hours No results found for this basename: AMMONIA:5 in the last 168 hours CBC:  Lab 07/22/12 1134  WBC 9.0  NEUTROABS 7.9*  HGB 13.8  HCT 42.0  MCV 84.7  PLT PLATELET CLUMPS NOTED ON SMEAR, COUNT APPEARS DECREASED   Cardiac Enzymes: No results found for this basename: CKTOTAL:5,CKMB:5,CKMBINDEX:5,TROPONINI:5 in the last 168 hours  BNP (last 3 results) No results found for this basename: PROBNP:3 in the last 8760 hours    CBG: No results found for this basename: GLUCAP:5 in the last 168 hours  Radiological Exams on Admission: Dg Chest 2 View  07/22/2012  *RADIOLOGY REPORT*  Clinical Data: Shortness of breath, hemoptysis  CHEST - 2 VIEW  Comparison: 08/15 /13  Findings: Cardiomegaly again noted.  Three leads cardiac pacemaker is unchanged in position. Worsening interstitial prominence and probable fibrotic changes bilateral lower lobe.  There is hazy airspace disease bilateral lower lobe.  Superimposed infiltrate or pulmonary edema cannot be excluded.  Clinical correlation is necessary.  IMPRESSION: Worsening interstitial prominence and probable fibrotic changes  bilateral lower lobe.  There is hazy airspace disease bilateral lower lobe.  Superimposed infiltrate or pulmonary edema cannot be excluded.  Clinical correlation is necessary.   Original Report Authenticated By: Natasha Mead, M.D.     EKG: Independently reviewed.Date: 07/22/2012  Rate: 92  Rhythm: paced  QRS Axis: normal  Intervals: paced  ST/T Wave abnormalities: paced  Conduction Disutrbances:paced  Narrative Interpretation:  Old EKG Reviewed: none available    Assessment/Plan Principal Problem:  *Community acquired pneumonia Active Problems:  OBSTRUCTIVE SLEEP APNEA  HYPERTENSION  ABNORMAL HEART RHYTHMS  COPD (chronic obstructive pulmonary disease)  DYSPNEA  Chronic systolic dysfunction of left ventricle  Atrial fibrillation  Hemoptysis, unspecified   1. Syncope likely secondary to dehydration/orthostatic hypotension in the setting of fever/pneumonia/chronic ongoing hemoptysis. Other embolism as the concern and therefore a Doppler of bilateral lower extremities and an ultrasound of the IVC filter is ordered and pending at this time. Will maintain on telemetry and cycle cardiac enzymes. 2. Chronic hemoptysis has a history of bronchoscopy done in the past. Previous sputum positive for staph aureus. Now has pneumonia. Agree with vancomycin and levofloxacin started by PCCM, appreciate the input. Continue nebulizer treatments, Spiriva 3. Chronic kidney disease stage III, baseline creatinine of 1.7, continue diuresis cautiously and if needed consult nephrology 4. Fever likely secondary to pneumonia blood cultures have been drawn 5. Atrial fibrillation not an anticoagulation has an IVC filter in place, intolerant to Coumadin due to hemoptysis 6. Nonischemic cardiomyopathy with last known EF of 35% with a biventricular ICD continue cautious diuresis with Lasix  Code Status:   full Family Communication: bedside Disposition Plan: admit   Time spent: 70 mins   [facility] Pager [phone]  If 7PM-7AM, please contact night-coverage [email or web address] Password [facility] 07/22/2012, 3:35 PM

## 2012-07-22 NOTE — ED Notes (Signed)
Per EMS, patient had a syncopal episode and fell to the carpet without injury.   Patient denies being hurt.   Per EMS, he had another syncopal episode approximately 1020 and was witnessed by EMS.   Patient claims unsure if he really had a syncopal episode.

## 2012-07-22 NOTE — ED Provider Notes (Signed)
History     CSN: [id number]  Arrival date & time 07/22/12  1105   First MD Initiated Contact with Patient 07/22/12 1114      Chief Complaint  Patient presents with  . Loss of Consciousness    (Consider location/radiation/quality/duration/timing/severity/associated sxs/prior treatment) Patient is a 79 y.o. male presenting with syncope. The history is provided by the patient.  Loss of Consciousness This is a new problem. Associated symptoms include chest pain. Pertinent negatives include no abdominal pain, no headaches and no shortness of breath.   patient had a syncopal episode today. He is a history of chronic hemoptysis. His been doing worse last couple days. he states he's felt weak. Patient has a fever here, however patient does not know that he was febrile. He is on home oxygen. His been feeling worse. No diarrhea. No vomiting. No other bleeding.  Past Medical History  Diagnosis Date  . Nonischemic cardiomyopathy     with EF 35%, s/p CRT-D device implanted  . Atrial fibrillation   . Unspecified sleep apnea   . HTN (hypertension)   . DVT (deep venous thrombosis)     recurrent. s/p IVC filter.   . Renal insufficiency   . Gout   . COPD (chronic obstructive pulmonary disease)   . CHF (congestive heart failure)     Past Surgical History  Procedure Date  . Cardiac defibrillator placement     initial BiV ICD 09/22/00 in [place].  Gen change (MDT) by Dr Amil Amen 10/30/09  . Pacemaker insertion   . Video bronchoscopy 02/26/2012    Procedure: VIDEO BRONCHOSCOPY WITHOUT FLUORO;  Surgeon: Barbaraann Share, MD;  Location: [facility];  Service: Cardiopulmonary;  Laterality: Bilateral;    Family History  Problem Relation Age of Onset  . Heart disease Mother     mother also had Rheumatism.    History  Substance Use Topics  . Smoking status: Former Smoker -- 1.5 packs/day for 30 years    Types: Cigarettes    Quit date: 07/02/1983  . Smokeless tobacco: Not on file  . Alcohol Use: No       Comment: quit after 40 years      Review of Systems  Constitutional: Positive for fatigue. Negative for activity change and appetite change.  HENT: Negative for neck stiffness.   Eyes: Negative for pain.  Respiratory: Negative for chest tightness and shortness of breath.        Hemoptysis.   Cardiovascular: Positive for chest pain and syncope. Negative for leg swelling.  Gastrointestinal: Negative for nausea, vomiting, abdominal pain and diarrhea.  Genitourinary: Negative for flank pain.  Musculoskeletal: Negative for back pain.  Skin: Negative for rash.  Neurological: Positive for syncope. Negative for weakness, numbness and headaches.  Psychiatric/Behavioral: Negative for behavioral problems.    Allergies  Review of patient's allergies indicates no known allergies.  Home Medications   No current outpatient prescriptions on file.  BP 120/78  Pulse 64  Temp 97.9 F (36.6 C) (Oral)  Resp 18  Ht 6\' 2"  (1.88 m)  Wt 280 lb 13.9 oz (127.4 kg)  BMI 36.06 kg/m2  SpO2 93%  Physical Exam  Nursing note and vitals reviewed. Constitutional: He is oriented to person, place, and time. He appears well-developed and well-nourished.  HENT:  Head: Normocephalic and atraumatic.  Eyes: EOM are normal. Pupils are equal, round, and reactive to light.  Neck: Normal range of motion. Neck supple.  Cardiovascular: Normal rate, regular rhythm and normal heart sounds.  No murmur heard. Pulmonary/Chest: Effort normal.       Harsh breath sounds throughout.   Abdominal: Soft. Bowel sounds are normal. He exhibits no distension and no mass. There is no tenderness. There is no rebound and no guarding.  Musculoskeletal: Normal range of motion. He exhibits no edema.  Neurological: He is alert and oriented to person, place, and time. No cranial nerve deficit.  Skin: Skin is warm and dry.  Psychiatric: He has a normal mood and affect.    ED Course  Procedures (including critical care  time)  Labs Reviewed  CBC WITH DIFFERENTIAL - Abnormal; Notable for the following:    RDW 15.7 (*)     Neutrophils Relative 88 (*)     Neutro Abs 7.9 (*)     Lymphocytes Relative 6 (*)     Lymphs Abs 0.5 (*)     All other components within normal limits  COMPREHENSIVE METABOLIC PANEL - Abnormal; Notable for the following:    Glucose, Bld 126 (*)     Creatinine, Ser 1.59 (*)     Albumin 3.2 (*)     GFR calc non Af Amer 41 (*)     GFR calc Af Amer 47 (*)     All other components within normal limits  URINALYSIS, ROUTINE W REFLEX MICROSCOPIC - Abnormal; Notable for the following:    Hgb urine dipstick TRACE (*)     All other components within normal limits  CBC - Abnormal; Notable for the following:    WBC 11.2 (*)     RDW 15.6 (*)     Platelets 108 (*)  PLATELET COUNT CONFIRMED BY SMEAR   All other components within normal limits  PRO B NATRIURETIC PEPTIDE - Abnormal; Notable for the following:    Pro B Natriuretic peptide (BNP) 4025.0 (*)     All other components within normal limits  BASIC METABOLIC PANEL - Abnormal; Notable for the following:    Glucose, Bld 107 (*)     Creatinine, Ser 1.59 (*)     GFR calc non Af Amer 41 (*)     GFR calc Af Amer 47 (*)     All other components within normal limits  CBC - Abnormal; Notable for the following:    Hemoglobin 12.3 (*)     HCT 38.0 (*)     RDW 15.7 (*)     Platelets 104 (*)  CONSISTENT WITH PREVIOUS RESULT   All other components within normal limits  PROTIME-INR  APTT  TROPONIN I  TSH  TROPONIN I  TROPONIN I  TROPONIN I  URINE MICROSCOPIC-ADD ON  MRSA PCR SCREENING  CULTURE, RESPIRATORY  CULTURE, BLOOD (ROUTINE X 2)  CULTURE, BLOOD (ROUTINE X 2)  CULTURE, EXPECTORATED SPUTUM-ASSESSMENT   Dg Chest 2 View  07/22/2012  *RADIOLOGY REPORT*  Clinical Data: Shortness of breath, hemoptysis  CHEST - 2 VIEW  Comparison: 08/15 /13  Findings: Cardiomegaly again noted.  Three leads cardiac pacemaker is unchanged in position.  Worsening interstitial prominence and probable fibrotic changes bilateral lower lobe.  There is hazy airspace disease bilateral lower lobe.  Superimposed infiltrate or pulmonary edema cannot be excluded.  Clinical correlation is necessary.  IMPRESSION: Worsening interstitial prominence and probable fibrotic changes bilateral lower lobe.  There is hazy airspace disease bilateral lower lobe.  Superimposed infiltrate or pulmonary edema cannot be excluded.  Clinical correlation is necessary.   Original Report Authenticated By: Natasha Mead, M.D.    Ct Chest Wo Contrast  07/22/2012  *  RADIOLOGY REPORT*  Clinical Data: Pulmonary infiltrates.  Chronic hemoptysis.  CT CHEST WITHOUT CONTRAST  Technique:  Multidetector CT imaging of the chest was performed following the standard protocol without IV contrast.  Comparison: Chest x-ray dated 07/22/2012 and chest CT dated 05/19/2012  Findings: The patient has a patchy new alveolar infiltrate at the right lung base posteriorly as well as marked increased accentuation of the interstitial markings in the right lower lobe consistent with interstitial infiltrates superimposed on chronic interstitial and obstructive lung disease.  The patient has extensive emphysematous disease primarily in the upper lobes with chronic interstitial disease in both lower lobes.  No effusions.  New cardiomegaly.  Stable slight mediastinal adenopathy which is probably reactive.  IMPRESSION: Acute interstitial and alveolar infiltrate in the right lower lobe superimposed on chronic lung disease.   Original Report Authenticated By: Francene Boyers, M.D.    US Abdomen Limited  07/22/2012  *RADIOLOGY REPORT*  Clinical Data: Evaluate for clot at the level of the inferior vena cava.  Admitted with syncope.  LIMITED ABDOMINAL ULTRASOUND  Comparison:  None.  Findings: Inferior vena cava filter is in place.  At the mid to upper aspect of the inferior vena cava filter and above the inferior vena cava filter, no clot  is visualized.  Distal aspect of the inferior vena cava filter and inferior to this level not visualized adequately secondary to overlying bowel gas.  IMPRESSION: Inferior vena cava filter is in place.  At the mid to upper aspect of the inferior vena cava filter and above the inferior vena cava filter, no clot is visualized.  Distal aspect of the inferior vena cava filter and inferior to this level not visualized adequately secondary to overlying bowel gas.   Original Report Authenticated By: Lacy Duverney, M.D.      1. Community acquired pneumonia   2. COPD with exacerbation   3. Hemoptysis, unspecified   4. Atrial fibrillation   5. Cardiac dysrhythmia, unspecified   6. Chronic systolic dysfunction of left ventricle   7. COPD (chronic obstructive pulmonary disease)      Date: 07/22/2012  Rate: 92  Rhythm: paced  QRS Axis: normal  Intervals: paced  ST/T Wave abnormalities: paced  Conduction Disutrbances:paced  Narrative Interpretation:   Old EKG Reviewed: none available    MDM  Patient with fever and likely pneumonia. History of chronic hemoptysis. Seen by pulmonary in the ER, and they requested a medicine admission.         Juliet Rude. Rubin Payor, MD 07/23/12 [phone]

## 2012-07-22 NOTE — ED Notes (Signed)
Admitting MD at bedside.

## 2012-07-22 NOTE — Progress Notes (Signed)
ANTIBIOTIC CONSULT NOTE - INITIAL  Pharmacy Consult for Vancomycin Indication: bronchiectasis, r/o pna  No Known Allergies  Patient Measurements: Height: 6\' 2"  (188 cm) Weight: 280 lb (127.007 kg) IBW/kg (Calculated) : 82.2   Vital Signs: Temp: 101.6 F (38.7 C) (01/22 1109) Temp src: Oral (01/22 1109) BP: 134/68 mmHg (01/22 1500) Pulse Rate: 89  (01/22 1500) Intake/Output from previous day:   Intake/Output from this shift:    Labs:  Basename 07/22/12 1134  WBC 9.0  HGB 13.8  PLT PLATELET CLUMPS NOTED ON SMEAR, COUNT APPEARS DECREASED  LABCREA --  CREATININE 1.59*   Estimated Creatinine Clearance: 56 ml/min (by C-G formula based on Cr of 1.59). No results found for this basename: VANCOTROUGH:2,VANCOPEAK:2,VANCORANDOM:2,GENTTROUGH:2,GENTPEAK:2,GENTRANDOM:2,TOBRATROUGH:2,TOBRAPEAK:2,TOBRARND:2,AMIKACINPEAK:2,AMIKACINTROU:2,AMIKACIN:2, in the last 72 hours   Microbiology: No results found for this or any previous visit (from the past 720 hour(s)).  Medical History: Past Medical History  Diagnosis Date  . Nonischemic cardiomyopathy     with EF 35%, s/p CRT-D device implanted  . Atrial fibrillation   . Unspecified sleep apnea   . HTN (hypertension)   . DVT (deep venous thrombosis)     recurrent. s/p IVC filter.   . Renal insufficiency   . Gout   . COPD (chronic obstructive pulmonary disease)   . CHF (congestive heart failure)     Medications:   (Not in a hospital admission) Assessment: 79 y/o male patient admitted with syncope,fever and hemoptysis requiring broad spectrum antibiotics for r/o pneumonia. Will adjust for renal function and obesity.  Goal of Therapy:  Vancomycin trough level 15-20 mcg/ml  Plan:  Vancomycin 2500mg  IV x1 then 1750mg  IV q24h. Monitor renal function and f/u c&s. Measure antibiotic drug levels at steady [address], PharmD, [place] Pager [phone] 07/22/2012,3:14 PM

## 2012-07-22 NOTE — Consult Note (Signed)
PULMONARY  / CRITICAL CARE MEDICINE  Name: Maurice Delgado MRN: [id number] DOB: [birth year]    LOS: 0  REFERRING PROVIDER:  EDP - Dr. Rubin Payor  CHIEF COMPLAINT:  Syncope, chronic hemoptysis  BRIEF PATIENT DESCRIPTION: 79 y/o AAM presented to [facility] ER on 1/22 after two episodes of syncope am of presentation.  Chronic hemoptysis.  PCCM consulted for hemoptysis.    PMH: COPD, OSA on CPAP, recurrent DVT s/p IVC filter, Afib -not on anticoagulation due to chronic hemoptysis, NICM with EF 35% s/p BiV ICD, chronic renal insufficiency   LINES / TUBES:  CULTURES: 1/22 Sputum>>> 1/22 BCx2>>>  ANTIBIOTICS: Vanco (hx staph in sputum, hemoptysis)1/22>>> Levaquin 1/22>>>  SIGNIFICANT EVENTS:  1/22 - Admit with syncope, hx of hemoptysis  LEVEL OF CARE:  SDU PRIMARY SERVICE:  TRH CONSULTANTS:  PCCM CODE STATUS:  Full DIET:  PO DVT Px:  IVC filter, SCD's GI Px:  None indicated  HISTORY OF PRESENT ILLNESS:  79 y/o AAM with PMH of COPD, OSA on CPAP, recurrent DVT s/p IVC filter, Afib -not on anticoagulation due to chronic hemoptysis, NICM with EF 35% s/p BiV ICD, chronic renal insufficiency who presented to [facility] ER with two episodes of syncope.  Reports he woke this am and attempted to walk to restroom but passed out near the bed.  Wife activated 911.  On arrival, he tried to walk to ambulance and made it downstairs but had second episode of syncope at bottom of stairs.  Did not have warning prior to events.  Reports he has been more short of breath than usual, chronic hemoptysis seems to be worse in am but no real change in volume.  Hx of bronch with Dr. Shelle Iron and LLL has been culprit for bleeding.  Specifically denies chest pain, pain with inspiration, nausea, vomiting, diarrhea, blood in stool, dark stool.    PAST MEDICAL HISTORY :  Past Medical History  Diagnosis Date  . Nonischemic cardiomyopathy     with EF 35%, s/p CRT-D device implanted  . Atrial fibrillation   . Unspecified  sleep apnea   . HTN (hypertension)   . DVT (deep venous thrombosis)     recurrent. s/p IVC filter.   . Renal insufficiency   . Gout   . COPD (chronic obstructive pulmonary disease)   . CHF (congestive heart failure)    Past Surgical History  Procedure Date  . Cardiac defibrillator placement     initial BiV ICD 09/22/00 in [place].  Gen change (MDT) by Dr Amil Amen 10/30/09  . Pacemaker insertion   . Video bronchoscopy 02/26/2012    Procedure: VIDEO BRONCHOSCOPY WITHOUT FLUORO;  Surgeon: Barbaraann Share, MD;  Location: [facility];  Service: Cardiopulmonary;  Laterality: Bilateral;   Prior to Admission medications   Medication Sig Start Date End Date Taking? Authorizing Provider  Ascorbic Acid (VITAMIN C) 1000 MG tablet Take 1,000 mg by mouth daily.     Yes Historical Provider, MD  aspirin 81 MG tablet Take 81 mg by mouth daily.     Yes Historical Provider, MD  calcitRIOL (ROCALTROL) 0.5 MCG capsule Take 0.5 mcg by mouth every other day.    Yes Historical Provider, MD  digoxin (LANOXIN) 0.125 MG tablet Take 125 mcg by mouth daily.     Yes Historical Provider, MD  Febuxostat (ULORIC) 80 MG TABS Take 80 mg by mouth daily.    Yes Historical Provider, MD  furosemide (LASIX) 40 MG tablet Take 40 mg by mouth daily.    Yes  Historical Provider, MD  levalbuterol [facility] HFA) 45 MCG/ACT inhaler Inhale 2 puffs into the lungs every 6 (six) hours as needed.     Yes Historical Provider, MD  metoprolol succinate (TOPROL-XL) 50 MG 24 hr tablet Take 1 tablet (50 mg total) by mouth daily. 05/25/12  Yes Hillis Range, MD  pregabalin (LYRICA) 150 MG capsule Take 150 mg by mouth 2 (two) times daily.     Yes Historical Provider, MD  Tamsulosin HCl (FLOMAX) 0.4 MG CAPS Take 0.4 mg by mouth daily.     Yes Historical Provider, MD  tiotropium (SPIRIVA HANDIHALER) 18 MCG inhalation capsule Place 18 mcg into inhaler and inhale daily.     Yes Historical Provider, MD   No Known Allergies  FAMILY HISTORY:  Family History    Problem Relation Age of Onset  . Heart disease Mother     mother also had Rheumatism.   SOCIAL HISTORY:  reports that he quit smoking about 29 years ago. His smoking use included Cigarettes. He has a 45 pack-year smoking history. He does not have any smokeless tobacco history on file. He reports that he does not drink alcohol or use illicit drugs.  REVIEW OF SYSTEMS:   Constitutional: Negative for fever, chills, weight loss, malaise/fatigue and diaphoresis.  HENT: Negative for hearing loss, ear pain, nosebleeds, congestion, sore throat, neck pain, tinnitus and ear discharge.  Denies sinus disease.    Eyes: Negative for blurred vision, double vision, photophobia, pain, discharge and redness.  Respiratory: Negative for wheezing and stridor. Indicates chronic cough, hemoptysis, sputum production, shortness of breath.   Cardiovascular: Negative for chest pain, palpitations, orthopnea, claudication, and PND.  Indicates chronic LE swelling L>R (unchanged) Gastrointestinal: Negative for heartburn, nausea, vomiting, abdominal pain, diarrhea, constipation, blood in stool and melena.  Genitourinary: Negative for dysuria, urgency, frequency, hematuria and flank pain.  Musculoskeletal: Negative for myalgias, back pain, joint pain and falls.  Skin: Negative for itching and rash.  Neurological: Negative for dizziness, tingling, tremors, sensory change, speech change, focal weakness, seizures,  weakness and headaches.  reports syncope x2 Endo/Heme/Allergies: Negative for environmental allergies and polydipsia. Does not bruise/bleed easily.  INTERVAL HISTORY:   Awake/alert, sitting up in bed.  NAD.  VITAL SIGNS: Temp:  [101.6 F (38.7 C)] 101.6 F (38.7 C) (01/22 1109) Pulse Rate:  [96-103] 96  (01/22 1115) Resp:  [18-20] 18  (01/22 1115) BP: (107-122)/(60-82) 107/60 mmHg (01/22 1115) SpO2:  [82 %-88 %] 82 % (01/22 1115) Weight:  [280 lb (127.007 kg)] 280 lb (127.007 kg) (01/22 1109)  PHYSICAL  EXAMINATION: General:  Chronically ill in NAD Neuro:  AAOx4, speech clear, MAE HEENT:  Mm pink/moist, long beard, no nasal drainage Cardiovascular:  s1s2 rrr, no m/r/g, paced rhythm on monitor Lungs:  resp's even/non-labored, lungs bilaterally with faint crackles Abdomen:  Round/soft, bsx4 active Musculoskeletal:  No acute deformities Skin:  Warm/dry, no edema   Lab 07/22/12 1134  NA 140  K 3.6  CL 102  CO2 27  BUN 17  CREATININE 1.59*  GLUCOSE 126*    Lab 07/22/12 1134  HGB 13.8  HCT 42.0  WBC 9.0  PLT PLATELET CLUMPS NOTED ON SMEAR, COUNT APPEARS DECREASED   Dg Chest 2 View  07/22/2012  *RADIOLOGY REPORT*  Clinical Data: Shortness of breath, hemoptysis  CHEST - 2 VIEW  Comparison: 08/15 /13  Findings: Cardiomegaly again noted.  Three leads cardiac pacemaker is unchanged in position. Worsening interstitial prominence and probable fibrotic changes bilateral lower lobe.  There is  hazy airspace disease bilateral lower lobe.  Superimposed infiltrate or pulmonary edema cannot be excluded.  Clinical correlation is necessary.  IMPRESSION: Worsening interstitial prominence and probable fibrotic changes bilateral lower lobe.  There is hazy airspace disease bilateral lower lobe.  Superimposed infiltrate or pulmonary edema cannot be excluded.  Clinical correlation is necessary.   Original Report Authenticated By: Natasha Mead, M.D.     ASSESSMENT / PLAN:  Chronic Hemoptysis - superior seg LLL on bronch in past has been culprit for bleeding.  Has responded in past well to abx. Hx Staph in sputum in past - specimen was insufficient for sensitivities Dec 2013.  Vasculitis work up has been negative. Well known to Dr. Shelle Iron (discussed extensively).  Need CT to better evaluate for edema vs. Infiltrate vs. Hemoptysis on plain film.  COPD - without acute exacerbation OSA - on CPAP 18 cm   Plan: -IV levaquin, Vanco Hx Staph in sputum in past - specimen was insufficient for sensitivities Dec  2013.) -cough suppression -xopenex Q3 PRN -continue spiriva -CT scan of chest (non-contrast) to reassess for edema, have felt in past that CHF is driving hemoptysis -culture sputum -lasix 40 IV x1 with KCL, consult renal , as had difficulty in past with diuresis, sees Dr Darrick Penna -CPAP 18 cm QHS -spo2 Q4, SDU -monitor hemoptysis which is at baseline now  Syncope Two episodes of syncope 1/22.  Pt denies warning, associated chest pain but was short of breath.   Plan: -further work up per Primary SVC -[place] IVC, BLE, consider carotids, tele  Fever - 101.6 on presentation, no leukocytosis.  See discussion of CT scan above  Plan: -blood cultures -sputum cultures -abx as above  CHF  Atrial Fibrillation - not on anticoagulation, IVC filter in place due to intolerance of coumadin with hemoptysis.  NICM - EF 35% sp BiV ICD HTN  Plan: -lasix to negative balance -may want renal input as has been very difficult to diurese in past -assess troponin  Canary Brim, NP-C Pulmonary and Critical Care Medicine [facility] Pager: [phone]  07/22/2012, 2:23 PM  I have fully examined this patient and agree with above findings.    And edited in full  Mcarthur Rossetti. Tyson Alias, MD, FACP Pgr: [phone] [place] [facility]

## 2012-07-23 DIAGNOSIS — M7989 Other specified soft tissue disorders: Secondary | ICD-10-CM

## 2012-07-23 DIAGNOSIS — I499 Cardiac arrhythmia, unspecified: Secondary | ICD-10-CM

## 2012-07-23 LAB — BASIC METABOLIC PANEL
BUN: 20 mg/dL (ref 6–23)
Calcium: 8.9 mg/dL (ref 8.4–10.5)
Creatinine, Ser: 1.59 mg/dL — ABNORMAL HIGH (ref 0.50–1.35)
GFR calc Af Amer: 47 mL/min — ABNORMAL LOW (ref >90)
GFR calc non Af Amer: 41 mL/min — ABNORMAL LOW (ref >90)

## 2012-07-23 LAB — CBC
MCHC: 32.4 g/dL (ref 30.0–36.0)
Platelets: 104 10*3/uL — ABNORMAL LOW (ref 150–400)
RDW: 15.7 % — ABNORMAL HIGH (ref 11.5–15.5)

## 2012-07-23 LAB — EXPECTORATED SPUTUM ASSESSMENT W GRAM STAIN, RFLX TO RESP C

## 2012-07-23 LAB — TSH: TSH: 0.433 u[IU]/mL (ref 0.350–4.500)

## 2012-07-23 LAB — TROPONIN I: Troponin I: <0.3 ng/mL (ref <0.30)

## 2012-07-23 NOTE — Progress Notes (Signed)
Pt sustained a fall, found sitting upright with legs extended on floor. Pt had no complaints of pain at the time. VS stable, Alert and oriented, neuro intact: 108/83, HR 106, RR 18, O2sats 94, temp- 97.8. Pt was attempting to wash up for prayer ritual and "got weak." IV and leads pulled off with fall. [place], NP notified, no new orders. Night RN to continue to monitor pt. Teshia Mahone L

## 2012-07-23 NOTE — Progress Notes (Signed)
[facility] PROGRESS NOTE  Maurice Delgado NWG:[id number] DOB: [birth year] DOA: 07/22/2012 PCP: [name], MD  Brief narrative: 79 y/o AAM with PMH of COPD, OSA on CPAP, recurrent DVT s/p IVC filter, Afib -not on anticoagulation due to chronic hemoptysis, NICM with EF 35% s/p BiV ICD, chronic renal insufficiency who presented to [facility] ER with two episodes of syncope. Reports he woke this am and attempted to walk to restroom but passed out near the bed. Wife activated 911. On arrival, he tried to walk to ambulance and made it downstairs but had second episode of syncope at bottom of stairs. Did not have warning prior to events. Reports he has been more short of breath than usual, chronic hemoptysis seems to be worse in am but no real change in volume. Hx of bronch with Dr. Shelle Iron and LLL has been culprit for bleeding. Specifically denies chest pain, pain with inspiration, nausea, vomiting, diarrhea, blood in stool, dark stool. Chronic hemoptysis. PCCM consulted for hemoptysis.    Assessment/Plan: Principal Problem:  *Community acquired pneumonia Right lower lobe, being treated with vancomycin and Levaquin Fever of 101.6 on admission-fevers resolved Sputum culture ordered today  Active Problems: Syncope Possibly secondary to hypoxia 2 episodes at home without any warning We'll follow him on telemetry, obtain orthostatics, check carotid Dopplers and repeat a 2-D echo We will start PT  Hemoptysis, unspecified Chronic issue but once again having this problem-sputum noted sitting in cup next the patient - this was bright red blood this morning and not blood tinged as notes have mentioned in the past - It was suspected by Dr. Shelle Iron in December at that hemoptysis may be secondary to heart failure and diuresis was attempted I do not have an echocardiogram in Epic- I have ordered one He has had a bronchoscopy in the past as mentioned above Pulmonary also following-   OBSTRUCTIVE  SLEEP APNEA CPAP at bedtime   HYPERTENSION Continue metoprolol   COPD (chronic obstructive pulmonary disease) Currently stable   Atrial fibrillation On a baby aspirin only- has been on Coumadin in the past  Chronic systolic heart failure - He does not appear to be having an exacerbation -I have ordered an echo  Code Status: Full code Family Communication: none Disposition Plan: Transfer to MedSurg DVT prophylaxis: SCDs   Consultants:  Pulmonary  Procedures:  none  Antibiotics:  Vancomycin and Levaquin started 1/22  HPI/Subjective: Patient alert, admits to coughing up blood, does not have any shortness of breath or chest pain, quite surprised when he is told that he has a pneumonia  Objective: Filed Vitals:   07/23/12 0500 07/23/12 0600 07/23/12 0729 07/23/12 1210  BP: 125/66 129/71 120/72 120/78  Pulse: 68 67 66 64  Temp:   98.2 F (36.8 C) 97.9 F (36.6 C)  TempSrc:   Oral Oral  Resp: 16 15 18 18   Height:      Weight:      SpO2: 93% 95% 94% 93%    Intake/Output Summary (Last 24 hours) at 07/23/12 1512 Last data filed at 07/23/12 1416  Gross per 24 hour  Intake   1300 ml  Output   2000 ml  Net   -700 ml    Exam:   General:  Awake alert oriented, no acute distress  Cardiovascular regular rate and rhythm no murmurs  Respiratory: Rhonchi in right lung base, no wheezing no crackles  Abdomen: Soft nontender nondistended bowel sounds positive  Ext: No cyanosis clubbing or edema  Data Reviewed: Basic Metabolic  Panel:  Lab 07/23/12 0420 07/22/12 1134  NA 139 140  K 3.8 3.6  CL 104 102  CO2 27 27  GLUCOSE 107* 126*  BUN 20 17  CREATININE 1.59* 1.59*  CALCIUM 8.9 9.2  MG -- --  PHOS -- --   Liver Function Tests:  Lab 07/22/12 1134  AST 20  ALT 15  ALKPHOS 83  BILITOT 1.1  PROT 6.5  ALBUMIN 3.2*   No results found for this basename: LIPASE:5,AMYLASE:5 in the last 168 hours No results found for this basename: AMMONIA:5 in the last  168 hours CBC:  Lab 07/23/12 0420 07/22/12 1656 07/22/12 1134  WBC 7.2 11.2* 9.0  NEUTROABS -- -- 7.9*  HGB 12.3* 13.1 13.8  HCT 38.0* 40.1 42.0  MCV 84.4 83.9 84.7  PLT 104* 108* PLATELET CLUMPS NOTED ON SMEAR, COUNT APPEARS DECREASED   Cardiac Enzymes:  Lab 07/23/12 0420 07/22/12 2224 07/22/12 1656 07/22/12 1457  CKTOTAL -- -- -- --  CKMB -- -- -- --  CKMBINDEX -- -- -- --  TROPONINI <0.30 <0.30 <0.30 <0.30   BNP (last 3 results)  Basename 07/22/12 1656  PROBNP 4025.0*   CBG: No results found for this basename: GLUCAP:5 in the last 168 hours  Recent Results (from the past 240 hour(s))  CULTURE, RESPIRATORY     Status: Normal (Preliminary result)   Collection Time   07/22/12  4:21 PM      Component Value Range Status Comment   Specimen Description SPUTUM   Final    Special Requests NONE   Final    Gram Stain     Final    Value: FEW WBC PRESENT,BOTH PMN AND MONONUCLEAR     MODERATE SQUAMOUS EPITHELIAL CELLS PRESENT     MODERATE GRAM POSITIVE COCCI IN CLUSTERS     IN PAIRS IN CHAINS RARE GRAM POSITIVE RODS   Culture PENDING   Incomplete    Report Status PENDING   Incomplete   MRSA PCR SCREENING     Status: Normal   Collection Time   07/22/12  5:40 PM      Component Value Range Status Comment   MRSA by PCR NEGATIVE  NEGATIVE Final      Studies: Dg Chest 2 View  07/22/2012  *RADIOLOGY REPORT*  Clinical Data: Shortness of breath, hemoptysis  CHEST - 2 VIEW  Comparison: 08/15 /13  Findings: Cardiomegaly again noted.  Three leads cardiac pacemaker is unchanged in position. Worsening interstitial prominence and probable fibrotic changes bilateral lower lobe.  There is hazy airspace disease bilateral lower lobe.  Superimposed infiltrate or pulmonary edema cannot be excluded.  Clinical correlation is necessary.  IMPRESSION: Worsening interstitial prominence and probable fibrotic changes bilateral lower lobe.  There is hazy airspace disease bilateral lower lobe.  Superimposed  infiltrate or pulmonary edema cannot be excluded.  Clinical correlation is necessary.   Original Report Authenticated By: Natasha Mead, M.D.    Ct Chest Wo Contrast  07/22/2012  *RADIOLOGY REPORT*  Clinical Data: Pulmonary infiltrates.  Chronic hemoptysis.  CT CHEST WITHOUT CONTRAST  Technique:  Multidetector CT imaging of the chest was performed following the standard protocol without IV contrast.  Comparison: Chest x-ray dated 07/22/2012 and chest CT dated 05/19/2012  Findings: The patient has a patchy new alveolar infiltrate at the right lung base posteriorly as well as marked increased accentuation of the interstitial markings in the right lower lobe consistent with interstitial infiltrates superimposed on chronic interstitial and obstructive lung disease.  The patient has  extensive emphysematous disease primarily in the upper lobes with chronic interstitial disease in both lower lobes.  No effusions.  New cardiomegaly.  Stable slight mediastinal adenopathy which is probably reactive.  IMPRESSION: Acute interstitial and alveolar infiltrate in the right lower lobe superimposed on chronic lung disease.   Original Report Authenticated By: Francene Boyers, M.D.    US Abdomen Limited  07/22/2012  *RADIOLOGY REPORT*  Clinical Data: Evaluate for clot at the level of the inferior vena cava.  Admitted with syncope.  LIMITED ABDOMINAL ULTRASOUND  Comparison:  None.  Findings: Inferior vena cava filter is in place.  At the mid to upper aspect of the inferior vena cava filter and above the inferior vena cava filter, no clot is visualized.  Distal aspect of the inferior vena cava filter and inferior to this level not visualized adequately secondary to overlying bowel gas.  IMPRESSION: Inferior vena cava filter is in place.  At the mid to upper aspect of the inferior vena cava filter and above the inferior vena cava filter, no clot is visualized.  Distal aspect of the inferior vena cava filter and inferior to this level not  visualized adequately secondary to overlying bowel gas.   Original Report Authenticated By: Lacy Duverney, M.D.     Scheduled Meds:   . aspirin  81 mg Oral Daily  . calcitRIOL  0.5 mcg Oral QODAY  . chlorpheniramine-HYDROcodone  5 mL Oral Q12H  . digoxin  125 mcg Oral Daily  . enoxaparin (LOVENOX) injection  0.5 mg/kg Subcutaneous Q24H  . febuxostat  80 mg Oral Daily  . furosemide  40 mg Oral Daily  . levofloxacin (LEVAQUIN) IV  750 mg Intravenous Q24H  . metoprolol succinate  50 mg Oral Daily  . pregabalin  150 mg Oral BID  . sodium chloride  3 mL Intravenous Q12H  . sodium chloride  3 mL Intravenous Q12H  . Tamsulosin HCl  0.4 mg Oral Daily  . tiotropium  18 mcg Inhalation Daily  . vancomycin  1,750 mg Intravenous Q24H   Continuous Infusions:   ________________________________________________________________________  Time spent in minutes: 40     [facility]  [facility] Pager [phone] If 8PM-8AM, please contact night-coverage at [email or web address], password [email or web address] 07/23/2012, 3:12 PM  LOS: 1 day

## 2012-07-23 NOTE — Progress Notes (Addendum)
VASCULAR LAB PRELIMINARY  PRELIMINARY  PRELIMINARY  PRELIMINARY  Bilateral lower extremity venous duplex  completed.    Preliminary report:  Right:  No evidence of DVT, superficial thrombosis, or Baker's cyst.  Left: Non occlusive DVT of indeterminate age noted in the distal CFV, PFV, Pop v, and gastrocnemius v.  No evidence of superficial thrombosis.  No Baker's cyst.  Maurice Delgado, RVT 07/23/2012, 2:40 PM

## 2012-07-23 NOTE — Progress Notes (Addendum)
**Note De-Identified  Obfuscation** Respiratory care protocol assessment: patient refusing treatment: CPAP QHS , Spiriva, and Xopenex .63 HHN Q6. Patient is on 2L Okay with SAT in mid 90s. BBS clear with no wheezing, CXR 07/23/11 shows infiltrates in right base. Treatment plan is to continue therapy on PRN basis. RT to continue to monitor with assessment to change plan.

## 2012-07-23 NOTE — Progress Notes (Signed)
Utilization Review Completed.   Marshe Shrestha, RN, BSN Nurse Case Manager  [phone]  

## 2012-07-23 NOTE — Progress Notes (Addendum)
PULMONARY  / CRITICAL CARE MEDICINE  Name: Maurice Delgado MRN: [id number] DOB: 11-17-1933    LOS: 1  REFERRING PROVIDER:  EDP - Dr. Rubin Payor  CHIEF COMPLAINT:  Syncope, chronic hemoptysis  BRIEF PATIENT DESCRIPTION: 79 y/o AAM presented to [facility] ER on 1/22 after two episodes of syncope am of presentation.  Chronic hemoptysis.  PCCM consulted for hemoptysis.    PMH: COPD, OSA on CPAP, recurrent DVT s/p IVC filter, Afib -not on anticoagulation due to chronic hemoptysis, NICM with EF 35% s/p BiV ICD, chronic renal insufficiency.   LINES / TUBES:  CULTURES: 1/22 Sputum>>> 1/22 BCx2>>>  ANTIBIOTICS: Vanco (hx staph in sputum, hemoptysis)1/22>>> Levaquin 1/22>>>  SIGNIFICANT EVENTS:  1/22 - Admit with syncope, hx of hemoptysis 1/22 - CT CHEST>>>Acute interstitial and alveolar infiltrate in the right lower lobe superimposed on chronic lung disease. 1/23 - no distress, on SDU, smear on resp culture noted    LEVEL OF CARE:  SDU PRIMARY SERVICE:  TRH CONSULTANTS:  PCCM CODE STATUS:  Full DIET:  PO DVT Px:  IVC filter, SCD's GI Px:  None indicated   INTERVAL HISTORY:   Awake/alert, sitting up in bed.  NAD.  VITAL SIGNS: Temp:  [97.9 F (36.6 C)-98.2 F (36.8 C)] 98.2 F (36.8 C) (01/23 0729) Pulse Rate:  [63-89] 66  (01/23 0729) Resp:  [11-21] 18  (01/23 0729) BP: (116-137)/(66-88) 120/72 mmHg (01/23 0729) SpO2:  [93 %-97 %] 94 % (01/23 0729) Weight:  [280 lb 13.9 oz (127.4 kg)] 280 lb 13.9 oz (127.4 kg) (01/22 1707)  PHYSICAL EXAMINATION: General: Chronically ill in NAD Neuro:  AAOx4, speech clear, MAE HEENT: Mm pink/moist, long beard, no nasal drainage Cardiovascular:  s1s2 rrr, no m/r/g, paced rhythm on monitor Lungs:  resp's even/non-labored, lungs bilaterally with faint crackles Abdomen:  Round/soft, bsx4 active Musculoskeletal:  No acute deformities Skin:  Warm/dry, no edema   Lab 07/23/12 0420 07/22/12 1134  NA 139 140  K 3.8 3.6  CL 104 102  CO2 27 27    BUN 20 17  CREATININE 1.59* 1.59*  GLUCOSE 107* 126*    Lab 07/23/12 0420 07/22/12 1656 07/22/12 1134  HGB 12.3* 13.1 13.8  HCT 38.0* 40.1 42.0  WBC 7.2 11.2* 9.0  PLT 104* 108* PLATELET CLUMPS NOTED ON SMEAR, COUNT APPEARS DECREASED   Dg Chest 2 View  07/22/2012  *RADIOLOGY REPORT*  Clinical Data: Shortness of breath, hemoptysis  CHEST - 2 VIEW  Comparison: 08/15 /13  Findings: Cardiomegaly again noted.  Three leads cardiac pacemaker is unchanged in position. Worsening interstitial prominence and probable fibrotic changes bilateral lower lobe.  There is hazy airspace disease bilateral lower lobe.  Superimposed infiltrate or pulmonary edema cannot be excluded.  Clinical correlation is necessary.  IMPRESSION: Worsening interstitial prominence and probable fibrotic changes bilateral lower lobe.  There is hazy airspace disease bilateral lower lobe.  Superimposed infiltrate or pulmonary edema cannot be excluded.  Clinical correlation is necessary.   Original Report Authenticated By: Natasha Mead, M.D.    Ct Chest Wo Contrast  07/22/2012  *RADIOLOGY REPORT*  Clinical Data: Pulmonary infiltrates.  Chronic hemoptysis.  CT CHEST WITHOUT CONTRAST  Technique:  Multidetector CT imaging of the chest was performed following the standard protocol without IV contrast.  Comparison: Chest x-ray dated 07/22/2012 and chest CT dated 05/19/2012  Findings: The patient has a patchy new alveolar infiltrate at the right lung base posteriorly as well as marked increased accentuation of the interstitial markings in the right lower lobe consistent  with interstitial infiltrates superimposed on chronic interstitial and obstructive lung disease.  The patient has extensive emphysematous disease primarily in the upper lobes with chronic interstitial disease in both lower lobes.  No effusions.  New cardiomegaly.  Stable slight mediastinal adenopathy which is probably reactive.  IMPRESSION: Acute interstitial and alveolar infiltrate in  the right lower lobe superimposed on chronic lung disease.   Original Report Authenticated By: Francene Boyers, M.D.    US Abdomen Limited  07/22/2012  *RADIOLOGY REPORT*  Clinical Data: Evaluate for clot at the level of the inferior vena cava.  Admitted with syncope.  LIMITED ABDOMINAL ULTRASOUND  Comparison:  None.  Findings: Inferior vena cava filter is in place.  At the mid to upper aspect of the inferior vena cava filter and above the inferior vena cava filter, no clot is visualized.  Distal aspect of the inferior vena cava filter and inferior to this level not visualized adequately secondary to overlying bowel gas.  IMPRESSION: Inferior vena cava filter is in place.  At the mid to upper aspect of the inferior vena cava filter and above the inferior vena cava filter, no clot is visualized.  Distal aspect of the inferior vena cava filter and inferior to this level not visualized adequately secondary to overlying bowel gas.   Original Report Authenticated By: Lacy Duverney, M.D.     ASSESSMENT / PLAN:  Chronic Hemoptysis - superior seg LLL on bronch in past has been culprit for bleeding.  Has responded in past well to abx. Hx Staph in sputum in past - specimen was insufficient for sensitivities Dec 2013.  Vasculitis work up has been negative. Well known to Dr. Shelle Iron (discussed extensively).  Need CT to better evaluate for edema vs. Infiltrate vs. Hemoptysis on plain film.  COPD - without acute exacerbation OSA - on CPAP 18 cm  RLL PNA , ct reviewed  Plan: -IV levaquin, Vanco Hx Staph in sputum in past - specimen was insufficient for sensitivities Dec 2013.) -cough suppression -xopenex Q3 PRN -continue spiriva -follow sputum culture -lasix, primary consider consult renal, as had difficulty in past with diuresis, sees Dr Deterding -CPAP 18 cm QHS -spo2 Q4, SDU -monitor hemoptysis, especially with new infiltrate rt base? -CT chest with severe empyematous disease, RLL airspace disease blood vs  infiltrate?? Favor infiltrate  Syncope Two episodes of syncope 1/22.  Pt denies warning, associated chest pain but was short of breath.   Plan: -further work up per Primary SVC -[place] IVC, BLE, consider carotids  Fever - 101.6 on presentation, no leukocytosis.  See discussion of CT scan above  Plan: -blood cultures -sputum cultures -abx as above  CHF  Atrial Fibrillation - not on anticoagulation, IVC filter in place due to intolerance of coumadin with hemoptysis. Neg [place] IVC for clot. NICM - EF 35% sp BiV ICD HTN  Plan: -lasix to negative balance -may want renal input as has been very difficult to diurese in past -troponin neg  Canary Brim, NP-C Pulmonary and Critical Care Medicine [facility] Pager: [phone]  07/23/2012, 11:57 AM   Likely rt base infectious, continue current ABx regimen   I have fully examined this patient and agree with above findings.    And edited infull  Mcarthur Rossetti. Tyson Alias, MD, FACP Pgr: [phone] [place] [facility]

## 2012-07-24 DIAGNOSIS — G4733 Obstructive sleep apnea (adult) (pediatric): Secondary | ICD-10-CM

## 2012-07-24 DIAGNOSIS — I519 Heart disease, unspecified: Secondary | ICD-10-CM

## 2012-07-24 DIAGNOSIS — R55 Syncope and collapse: Secondary | ICD-10-CM | POA: Diagnosis present

## 2012-07-24 MED ORDER — FUROSEMIDE 40 MG PO TABS
40.0000 mg | ORAL_TABLET | Freq: Every day | ORAL | Status: DC
  Administered 2012-07-24 – 2012-07-27 (×4): 40 mg via ORAL
  Filled 2012-07-24 (×5): qty 1

## 2012-07-24 MED ORDER — METOPROLOL SUCCINATE ER 25 MG PO TB24
25.0000 mg | ORAL_TABLET | Freq: Every day | ORAL | Status: DC
  Administered 2012-07-25 – 2012-07-26 (×2): 25 mg via ORAL
  Filled 2012-07-24 (×2): qty 1

## 2012-07-24 MED ORDER — LEVALBUTEROL HCL 0.63 MG/3ML IN NEBU
0.6300 mg | INHALATION_SOLUTION | RESPIRATORY_TRACT | Status: DC | PRN
  Administered 2012-07-24: 0.63 mg via RESPIRATORY_TRACT
  Filled 2012-07-24: qty 3

## 2012-07-24 NOTE — Consult Note (Signed)
Admit date: 07/22/2012 Referring Physician  Dr. Sharon Seller Primary Physician  Dr. [name] Primary Cardiologist  Dr. Verdis Prime Reason for Consultation  Syncope  HPI: 79 y/o AAM with PMH of COPD, OSA on CPAP, recurrent DVT s/p IVC filter, Afib -not on anticoagulation due to chronic hemoptysis, NICM with EF 35% s/p BiV ICD, chronic renal insufficiency who presented to [facility] ER with two episodes of syncope. Reports he woke this am and was having some hemoptysis.  He could not go back to sleep and got up to do his prayer ritual which consists of a prayer in which he would kneel down and then stand up about 6 times.  After this was done he sat back in the chair and felt like he could not get back up.  He called to his wife and daughter to help get him back in bed and then he passed out.  His wife activated 911. On arrival, he tried to walk to ambulance and made it downstairs but had second episode of syncope at bottom of stairs. Did not have warning prior to events. Reports he has been more short of breath than usual, chronic hemoptysis seems to be worse in am but no real change in volume. Hx of bronch with Dr. Shelle Iron and LLL has been culprit for bleeding. Specifically denies chest pain, pain with inspiration, nausea, vomiting, diarrhea, blood in stool, dark stool. Chronic hemoptysis. Cardiology is now consulted to evaluate syncope.     PMH:   Past Medical History  Diagnosis Date  . Nonischemic cardiomyopathy     with EF 35%, s/p CRT-D device implanted  . Atrial fibrillation   . Unspecified sleep apnea   . HTN (hypertension)   . DVT (deep venous thrombosis)     recurrent. s/p IVC filter.   . Renal insufficiency   . Gout   . COPD (chronic obstructive pulmonary disease)   . CHF (congestive heart failure)      PSH:   Past Surgical History  Procedure Date  . Cardiac defibrillator placement     initial BiV ICD 09/22/00 in [place].  Gen change (MDT) by Dr Amil Amen 10/30/09  . Pacemaker insertion   .  Video bronchoscopy 02/26/2012    Procedure: VIDEO BRONCHOSCOPY WITHOUT FLUORO;  Surgeon: Barbaraann Share, MD;  Location: [facility];  Service: Cardiopulmonary;  Laterality: Bilateral;    Allergies:  Review of patient's allergies indicates no known allergies. Prior to Admit Meds:   Prescriptions prior to admission  Medication Sig Dispense Refill  . Ascorbic Acid (VITAMIN C) 1000 MG tablet Take 1,000 mg by mouth daily.        Marland Kitchen aspirin 81 MG tablet Take 81 mg by mouth daily.        . calcitRIOL (ROCALTROL) 0.5 MCG capsule Take 0.5 mcg by mouth every other day.       . digoxin (LANOXIN) 0.125 MG tablet Take 125 mcg by mouth daily.        . Febuxostat (ULORIC) 80 MG TABS Take 80 mg by mouth daily.       . furosemide (LASIX) 40 MG tablet Take 40 mg by mouth daily.       Marland Kitchen levalbuterol (XOPENEX HFA) 45 MCG/ACT inhaler Inhale 2 puffs into the lungs every 6 (six) hours as needed.        . metoprolol succinate (TOPROL-XL) 50 MG 24 hr tablet Take 1 tablet (50 mg total) by mouth daily.  90 tablet  12  . pregabalin (LYRICA)  150 MG capsule Take 150 mg by mouth 2 (two) times daily.        . Tamsulosin HCl (FLOMAX) 0.4 MG CAPS Take 0.4 mg by mouth daily.        Marland Kitchen tiotropium (SPIRIVA HANDIHALER) 18 MCG inhalation capsule Place 18 mcg into inhaler and inhale daily.         Fam HX:    Family History  Problem Relation Age of Onset  . Heart disease Mother     mother also had Rheumatism.   Social HX:    History   Social History  . Marital Status: Married    Spouse Name: N/A    Number of Children: N/A  . Years of Education: N/A   Occupational History  . reitred     from education   Social History Main Topics  . Smoking status: Former Smoker -- 1.5 packs/day for 30 years    Types: Cigarettes    Quit date: 07/02/1983  . Smokeless tobacco: Not on file  . Alcohol Use: No     Comment: quit after 40 years  . Drug Use: No     Comment: quit useing marajuna after 40 years   . Sexually Active: Not on  file   Other Topics Concern  . Not on file   Social History Narrative   Married and has 1 son. Lives in [place].  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     ROS:  All 11 ROS were addressed and are negative except what is stated in the HPI  Physical Exam: Blood pressure 148/78, pulse 79, temperature 98.4 F (36.9 C), temperature source Oral, resp. rate 18, height 6\' 2"  (1.88 m), weight 129.6 kg (285 lb 11.5 oz), SpO2 94.00%.    General: Well developed, well nourished, in no acute distress Head: Eyes PERRLA, No xanthomas.   Normal cephalic and atramatic  Lungs:   Clear bilaterally to auscultation and percussion. Heart:   HRRR S1 S2 Pulses are 2+ & equal.            No carotid bruit. No JVD.  No abdominal bruits. No femoral bruits. Abdomen: Bowel sounds are positive, abdomen soft and non-tender without masses  Extremities:   No clubbing, cyanosis or edema.  DP +1 Neuro: Alert and oriented X 3. Psych:  Good affect, responds appropriately    Labs:   Lab Results  Component Value Date   WBC 7.2 07/23/2012   HGB 12.3* 07/23/2012   HCT 38.0* 07/23/2012   MCV 84.4 07/23/2012   PLT 104* 07/23/2012    Lab 07/23/12 0420 07/22/12 1134  NA 139 --  K 3.8 --  CL 104 --  CO2 27 --  BUN 20 --  CREATININE 1.59* --  CALCIUM 8.9 --  PROT -- 6.5  BILITOT -- 1.1  ALKPHOS -- 83  ALT -- 15  AST -- 20  GLUCOSE 107* --   No results found for this basename: PTT   Lab Results  Component Value Date   INR 1.11 07/22/2012   INR 1.7* 02/13/2012   INR 2.35* 07/01/2011   Lab Results  Component Value Date   TROPONINI <0.30 07/23/2012         Radiology:  No results found.  EKG:  Atrial fibrillation with V pacing  ASSESSMENT:  1.  Syncope of ? Etiology.   2.  Nonischemic DCM EF appears to have decreased some from prior assessment of 35% although that assessment was done years ago. 3.  Chronic afib -  not on anticoagulation due to history of hemoptysis 4.  Biventricular AICD 5.  NYHA class III CHF -  appears compensated 6.  OSA 7.  HTN  PLAN:   1.  EF does appear to have decreased since last assessment but that assessment was from years ago.  I cannot find an echo from our office from at least 2009.  I will have his AICD interrogated to rule out arrhythmia.  Most likely this was orthostatic in nature given what he was doing at the time although orthostatics done on exam are normal. 2.  Continue beta blocker and Lasix 3.  Consider ACE I - is currently not on this ? Secondary to renal insuff but may want to consider adding on and watch renal function  Quintella Reichert, MD  07/24/2012  4:19 PM

## 2012-07-24 NOTE — Progress Notes (Signed)
PT Cancellation Note  Patient Details Name: Maurice Delgado MRN: [id number] DOB: 16-Feb-1934   Cancelled Treatment:    Reason Eval/Treat Not Completed: Patient at procedure or test/unavailable (pt OOR for vascular ) Will attempt in pm as time allows   Delaney Meigs, PT [phone]

## 2012-07-24 NOTE — Progress Notes (Signed)
  Echocardiogram 2D Echocardiogram has been performed.  Cathie Beams 07/24/2012, 9:24 AM

## 2012-07-24 NOTE — Progress Notes (Signed)
Pharmacist Heart Failure Core Measure Documentation  Assessment: Maurice Delgado has an EF documented as 35% on 07/22/12 by MD chart note.  Rationale: Heart failure patients with left ventricular systolic dysfunction (LVSD) and an EF < 40% should be prescribed an angiotensin converting enzyme inhibitor (ACEI) or angiotensin receptor blocker (ARB) at discharge unless a contraindication is documented in the medical record.  This patient is not currently on an ACEI or ARB for HF.  This note is being placed in the record in order to provide documentation that a contraindication to the use of these agents is present for this encounter.  ACE Inhibitor or Angiotensin Receptor Blocker is contraindicated (specify all that apply)  []   ACEI allergy AND ARB allergy []   Angioedema []   Moderate or severe aortic stenosis []   Hyperkalemia []   Hypotension []   Renal artery stenosis [x]   Worsening renal function, preexisting renal disease or dysfunction   Mickeal Skinner 07/24/2012 10:02 AM

## 2012-07-24 NOTE — Progress Notes (Signed)
ANTIBIOTIC CONSULT NOTE - FOLLOW UP  Pharmacy Consult for Vanco Indication: CAP  No Known Allergies  Patient Measurements: Height: 6\' 2"  (188 cm) Weight: 285 lb 11.5 oz (129.6 kg) IBW/kg (Calculated) : 82.2  Adjusted Body Weight:   Vital Signs: Temp: 98.3 F (36.8 C) (01/24 0820) Temp src: Oral (01/24 0820) BP: 144/96 mmHg (01/24 0820) Pulse Rate: 74  (01/24 0820) Intake/Output from previous day: 01/23 0701 - 01/24 0700 In: 1610 [P.O.:960; IV Piggyback:650] Out: 1500 [Urine:1500] Intake/Output from this shift:    Labs:  Basename 07/23/12 0420 07/22/12 1656 07/22/12 1134  WBC 7.2 11.2* 9.0  HGB 12.3* 13.1 13.8  PLT 104* 108* PLATELET CLUMPS NOTED ON SMEAR, COUNT APPEARS DECREASED  LABCREA -- -- --  CREATININE 1.59* -- 1.59*   Estimated Creatinine Clearance: 56.6 ml/min (by C-G formula based on Cr of 1.59). No results found for this basename: VANCOTROUGH:2,VANCOPEAK:2,VANCORANDOM:2,GENTTROUGH:2,GENTPEAK:2,GENTRANDOM:2,TOBRATROUGH:2,TOBRAPEAK:2,TOBRARND:2,AMIKACINPEAK:2,AMIKACINTROU:2,AMIKACIN:2, in the last 72 hours   Microbiology: Recent Results (from the past 720 hour(s))  CULTURE, BLOOD (ROUTINE X 2)     Status: Normal (Preliminary result)   Collection Time   07/22/12  2:56 PM      Component Value Range Status Comment   Specimen Description BLOOD RIGHT ARM   Final    Special Requests BOTTLES DRAWN AEROBIC AND ANAEROBIC 10CC   Final    Culture  Setup Time 07/23/2012 00:04   Final    Culture     Final    Value:        BLOOD CULTURE RECEIVED NO GROWTH TO DATE CULTURE WILL BE HELD FOR 5 DAYS BEFORE ISSUING A FINAL NEGATIVE REPORT   Report Status PENDING   Incomplete   CULTURE, BLOOD (ROUTINE X 2)     Status: Normal (Preliminary result)   Collection Time   07/22/12  3:15 PM      Component Value Range Status Comment   Specimen Description BLOOD RIGHT HAND   Final    Special Requests BOTTLES DRAWN AEROBIC AND ANAEROBIC 10CC   Final    Culture  Setup Time 07/23/2012  00:04   Final    Culture     Final    Value:        BLOOD CULTURE RECEIVED NO GROWTH TO DATE CULTURE WILL BE HELD FOR 5 DAYS BEFORE ISSUING A FINAL NEGATIVE REPORT   Report Status PENDING   Incomplete   CULTURE, RESPIRATORY     Status: Normal (Preliminary result)   Collection Time   07/22/12  4:21 PM      Component Value Range Status Comment   Specimen Description SPUTUM   Final    Special Requests NONE   Final    Gram Stain     Final    Value: FEW WBC PRESENT,BOTH PMN AND MONONUCLEAR     MODERATE SQUAMOUS EPITHELIAL CELLS PRESENT     MODERATE GRAM POSITIVE COCCI IN CLUSTERS     IN PAIRS IN CHAINS RARE GRAM POSITIVE RODS   Culture NORMAL OROPHARYNGEAL FLORA   Final    Report Status PENDING   Incomplete   MRSA PCR SCREENING     Status: Normal   Collection Time   07/22/12  5:40 PM      Component Value Range Status Comment   MRSA by PCR NEGATIVE  NEGATIVE Final   CULTURE, EXPECTORATED SPUTUM-ASSESSMENT     Status: Normal   Collection Time   07/23/12  7:41 PM      Component Value Range Status Comment   Specimen Description  SPUTUM   Final    Special Requests NONE   Final    Sputum evaluation     Final    Value: THIS SPECIMEN IS ACCEPTABLE. RESPIRATORY CULTURE REPORT TO FOLLOW.   Report Status 07/23/2012 FINAL   Final     Anti-infectives     Start     Dose/Rate Route Frequency Ordered Stop   07/23/12 1530   vancomycin (VANCOCIN) 1,750 mg in sodium chloride 0.9 % 500 mL IVPB        1,750 mg 250 mL/hr over 120 Minutes Intravenous Every 24 hours 07/22/12 1520     07/22/12 1530   vancomycin (VANCOCIN) 2,500 mg in sodium chloride 0.9 % 500 mL IVPB        2,500 mg 250 mL/hr over 120 Minutes Intravenous  Once 07/22/12 1520 07/22/12 2011   07/22/12 1445   levofloxacin (LEVAQUIN) IVPB 750 mg        750 mg 100 mL/hr over 90 Minutes Intravenous Every 24 hours 07/22/12 1441            Assessment: Admit Complaint: Syncope and chronic hemoptysis 1/23: Patient noted to have a fall  overnight. Legs got weak.  Anticoagulation: Lovenox 65mg  daily for VTE proph (127kg). History of DVT/Afib not not on AC due to hemoptysis. IVC filter in place. Hgb down slightly to 12.3. Platelets stable at 104.  Infectious Disease: Treating pneumonia. History of a staph aureus in sputum (no sens done). Afebrile. WBC down to 7.2  1/22 vanco>> 1/22 levaquin>>  1/22 sputum>> pending 1/22 Blood x 2>>  Goal of Therapy:  Vancomycin trough level 15-20 mcg/ml  Plan:  Vanco 1750mg  IV q24h (steady state trough 12/26 or 12/27) Levaquin 750mg  IV q24h (while CrCl>50)  Merilynn Finland, [employer] 07/24/2012,9:24 AM

## 2012-07-24 NOTE — Progress Notes (Signed)
Subjective: No significant sob this am.  Has cough, but only producing scant yellow mucus with small streak of blood during my visit.  He feels his degree of hemoptysis is similar to usual baseline.   Objective: Vital signs in last 24 hours: Blood pressure 128/75, pulse 44, temperature 97.2 F (36.2 C), temperature source Oral, resp. rate 16, height 6\' 2"  (1.88 m), weight 285 lb 11.5 oz (129.6 kg), SpO2 95.00%.  Intake/Output from previous day: 01/23 0701 - 01/24 0700 In: 1610 [P.O.:960; IV Piggyback:650] Out: 1500 [Urine:1500]   Physical Exam:   ow male in nad Nose without purulence or discharge noted. OP clear Neck without TMG or LN Chest with right basilar crackles, no wheezing Cor with rrr abd soft, bs+ LE with mild edema, no cyanosis Alert and oriented, moves all 4.    Lab Results:  Basename 07/23/12 0420 07/22/12 1656 07/22/12 1134  WBC 7.2 11.2* 9.0  HGB 12.3* 13.1 13.8  HCT 38.0* 40.1 42.0  PLT 104* 108* PLATELET CLUMPS NOTED ON SMEAR, COUNT APPEARS DECREASED   BMET  Basename 07/23/12 0420 07/22/12 1134  NA 139 140  K 3.8 3.6  CL 104 102  CO2 27 27  GLUCOSE 107* 126*  BUN 20 17  CREATININE 1.59* 1.59*  CALCIUM 8.9 9.2    Studies/Results: Ct Chest Wo Contrast  07/22/2012  *RADIOLOGY REPORT*  Clinical Data: Pulmonary infiltrates.  Chronic hemoptysis.  CT CHEST WITHOUT CONTRAST  Technique:  Multidetector CT imaging of the chest was performed following the standard protocol without IV contrast.  Comparison: Chest x-ray dated 07/22/2012 and chest CT dated 05/19/2012  Findings: The patient has a patchy new alveolar infiltrate at the right lung base posteriorly as well as marked increased accentuation of the interstitial markings in the right lower lobe consistent with interstitial infiltrates superimposed on chronic interstitial and obstructive lung disease.  The patient has extensive emphysematous disease primarily in the upper lobes with chronic interstitial  disease in both lower lobes.  No effusions.  New cardiomegaly.  Stable slight mediastinal adenopathy which is probably reactive.  IMPRESSION: Acute interstitial and alveolar infiltrate in the right lower lobe superimposed on chronic lung disease.   Original Report Authenticated By: Francene Boyers, M.D.    US Abdomen Limited  07/22/2012  *RADIOLOGY REPORT*  Clinical Data: Evaluate for clot at the level of the inferior vena cava.  Admitted with syncope.  LIMITED ABDOMINAL ULTRASOUND  Comparison:  None.  Findings: Inferior vena cava filter is in place.  At the mid to upper aspect of the inferior vena cava filter and above the inferior vena cava filter, no clot is visualized.  Distal aspect of the inferior vena cava filter and inferior to this level not visualized adequately secondary to overlying bowel gas.  IMPRESSION: Inferior vena cava filter is in place.  At the mid to upper aspect of the inferior vena cava filter and above the inferior vena cava filter, no clot is visualized.  Distal aspect of the inferior vena cava filter and inferior to this level not visualized adequately secondary to overlying bowel gas.   Original Report Authenticated By: Lacy Duverney, M.D.     Assessment/Plan:  1) chronic hemoptysis: The pt has had an issue with this for months, and is low grade.  He has had a bronch with heme staining in superior segment of LLL, but all samples negative except for culture + for MSSA.  He has been treated with multiple rounds of abx, and his coumadin has been stopped.  His autoimmune evaluation by renal was negative.  CHF was also in the differential given ct appearance late last year, but attempts at diuresis difficult due to renal insufficiency.  His current ct shows persistent IS and AS opacities at right >> left base, and the right is worse than prior ct.  His history is really not strong for PNA, but this is possible.  I would focus efforts on empiric treatment for PNA, would try to aggressively  diurese with IV meds while in hospital (watching renal fxn carefully), and will see how he responds.  He ultimately may end up needing lung bx with bronch or surgical bx?  2) COPD This issue is very stable currently.  Keep on usual home meds.    Will check again on Monday.  Please call if pulmonary questions arise.   Barbaraann Share, M.D. 07/24/2012, 1:32 PM

## 2012-07-24 NOTE — Progress Notes (Signed)
[facility] Progress Note [place] TEAM 1 - Stepdown/ICU TEAM   Stefanie Libel ZOX:[id number] DOB: 1933-07-20 DOA: 07/22/2012 PCP: [name], MD  Brief narrative: 79 y/o male with PMH of COPD, OSA on CPAP, recurrent DVT s/p IVC filter, Afib - not on anticoagulation due to chronic hemoptysis, NICM with EF 35% s/p BiV ICD, chronic renal insufficiency who presented to [facility] with two episodes of syncope. Reported he awoke and attempted to walk to restroom but passed out near the bed. Wife activated 911. On arrival, he tried to walk to ambulance and made it downstairs but had second episode of syncope at bottom of stairs. Did not have warning prior to events. Reported he had been more short of breath than usual, chronic hemoptysis seemed to be worse. Hx of bronch with Dr. Shelle Iron and LLL has been culprit for bleeding. Specifically denied chest pain, pain with inspiration, nausea, vomiting, diarrhea, blood in stool, dark stool.  Assessment/Plan:  RLL Community acquired pneumonia  On empiric abx - awaiting culture data  Syncope  Etiology unclear - given further drop in EF, concerned this could be cardiogenic, though not likey arrythmia related as pt has AICD/BiV pacer - I have asked his Cardiologist to see him as a consult  Hemoptysis - chronic Chronic issue - Pulm following - previously suspected to be due to CHF - has had a negative vasculitis w/u in past  Hx of LLE DVT - s/p IVC filter  Unable to anticoag due to above - no acute complications  OBSTRUCTIVE SLEEP APNEA on home CPAP CPAP at bedtime as per home regimen  HYPERTENSION  Well controlled at present time   COPD (chronic obstructive pulmonary disease)  Currently stable - no wheezing   Atrial fibrillation  On a baby aspirin only - has been on Coumadin in the past but not a candidate now due to hemoptysis   Chronic systolic heart failure - EF 35% Appears well compensated at present, but echo reveals signif  decline in EF from reported baseline - I have asked his Cardiologist to see him as a consult   S/p BiV AICD  Code Status: FULL Disposition Plan: transfer to tele bed  Consultants: PCCM Cardiology - [facility]  Procedures: Carotid dopplers - 1/24 - no evidence of signif carotid disease TTE - 1/24 - EF 20-25% - mild pulm HTN  Antibiotics: Vancomycin 1/22>> Levaquin 1/22>>  DVT prophylaxis: SCDs + IVC filter  HPI/Subjective: Patient experiencing some lightheadedness when attempting to walk.  He currently denies chest pain fevers chills nausea vomiting or abdominal pain.  He denies significant shortness of breath at this time.   Objective: Blood pressure 128/75, pulse 44, temperature 97.2 F (36.2 C), temperature source Oral, resp. rate 16, height 6\' 2"  (1.88 m), weight 129.6 kg (285 lb 11.5 oz), SpO2 95.00%.  Intake/Output Summary (Last 24 hours) at 07/24/12 1312 Last data filed at 07/24/12 1137  Gross per 24 hour  Intake   1490 ml  Output    950 ml  Net    540 ml     Exam: General: No acute respiratory distress at rest Lungs: Right basilar crackles with good air movement throughout other fields without wheeze Cardiovascular: Distant heart sounds, regular rate, no appreciable murmur Abdomen: Overweight, nontender, nondistended, soft, bowel sounds positive, no rebound, no ascites, no appreciable mass Extremities: 1+ bilateral lower extremity edema without cyanosis or clubbing  Data Reviewed: Basic Metabolic Panel:  Lab 07/23/12 8119 07/22/12 1134  NA 139 140  K 3.8  3.6  CL 104 102  CO2 27 27  GLUCOSE 107* 126*  BUN 20 17  CREATININE 1.59* 1.59*  CALCIUM 8.9 9.2  MG -- --  PHOS -- --   Liver Function Tests:  Lab 07/22/12 1134  AST 20  ALT 15  ALKPHOS 83  BILITOT 1.1  PROT 6.5  ALBUMIN 3.2*   CBC:  Lab 07/23/12 0420 07/22/12 1656 07/22/12 1134  WBC 7.2 11.2* 9.0  NEUTROABS -- -- 7.9*  HGB 12.3* 13.1 13.8  HCT 38.0* 40.1 42.0  MCV 84.4 83.9 84.7  PLT  104* 108* PLATELET CLUMPS NOTED ON SMEAR, COUNT APPEARS DECREASED   Cardiac Enzymes:  Lab 07/23/12 0420 07/22/12 2224 07/22/12 1656 07/22/12 1457  CKTOTAL -- -- -- --  CKMB -- -- -- --  CKMBINDEX -- -- -- --  TROPONINI <0.30 <0.30 <0.30 <0.30   BNP (last 3 results)  Basename 07/22/12 1656  PROBNP 4025.0*    Recent Results (from the past 240 hour(s))  CULTURE, BLOOD (ROUTINE X 2)     Status: Normal (Preliminary result)   Collection Time   07/22/12  2:56 PM      Component Value Range Status Comment   Specimen Description BLOOD RIGHT ARM   Final    Special Requests BOTTLES DRAWN AEROBIC AND ANAEROBIC 10CC   Final    Culture  Setup Time 07/23/2012 00:04   Final    Culture     Final    Value:        BLOOD CULTURE RECEIVED NO GROWTH TO DATE CULTURE WILL BE HELD FOR 5 DAYS BEFORE ISSUING A FINAL NEGATIVE REPORT   Report Status PENDING   Incomplete   CULTURE, BLOOD (ROUTINE X 2)     Status: Normal (Preliminary result)   Collection Time   07/22/12  3:15 PM      Component Value Range Status Comment   Specimen Description BLOOD RIGHT HAND   Final    Special Requests BOTTLES DRAWN AEROBIC AND ANAEROBIC 10CC   Final    Culture  Setup Time 07/23/2012 00:04   Final    Culture     Final    Value:        BLOOD CULTURE RECEIVED NO GROWTH TO DATE CULTURE WILL BE HELD FOR 5 DAYS BEFORE ISSUING A FINAL NEGATIVE REPORT   Report Status PENDING   Incomplete   CULTURE, RESPIRATORY     Status: Normal (Preliminary result)   Collection Time   07/22/12  4:21 PM      Component Value Range Status Comment   Specimen Description SPUTUM   Final    Special Requests NONE   Final    Gram Stain     Final    Value: FEW WBC PRESENT,BOTH PMN AND MONONUCLEAR     MODERATE SQUAMOUS EPITHELIAL CELLS PRESENT     MODERATE GRAM POSITIVE COCCI IN CLUSTERS     IN PAIRS IN CHAINS RARE GRAM POSITIVE RODS   Culture NORMAL OROPHARYNGEAL FLORA   Final    Report Status PENDING   Incomplete   MRSA PCR SCREENING     Status:  Normal   Collection Time   07/22/12  5:40 PM      Component Value Range Status Comment   MRSA by PCR NEGATIVE  NEGATIVE Final   CULTURE, EXPECTORATED SPUTUM-ASSESSMENT     Status: Normal   Collection Time   07/23/12  7:41 PM      Component Value Range Status Comment   Specimen  Description SPUTUM   Final    Special Requests NONE   Final    Sputum evaluation     Final    Value: THIS SPECIMEN IS ACCEPTABLE. RESPIRATORY CULTURE REPORT TO FOLLOW.   Report Status 07/23/2012 FINAL   Final   CULTURE, RESPIRATORY     Status: Normal (Preliminary result)   Collection Time   07/23/12  7:41 PM      Component Value Range Status Comment   Specimen Description SPUTUM   Final    Special Requests NONE   Final    Gram Stain     Final    Value: MODERATE WBC PRESENT,BOTH PMN AND MONONUCLEAR     FEW SQUAMOUS EPITHELIAL CELLS PRESENT     MODERATE GRAM POSITIVE RODS     RARE GRAM POSITIVE COCCI IN PAIRS     RARE YEAST   Culture PENDING   Incomplete    Report Status PENDING   Incomplete      Studies:  Recent x-ray studies have been reviewed in detail by the Attending Physician  Scheduled Meds:  Reviewed in detail by the Attending Physician   [name], MD [facility] Office  [phone] Pager [phone]  On-Call/Text Page:      [email or web address]      password [email or web address]  If 7PM-7AM, please contact night-coverage [email or web address] Password [email or web address] 07/24/2012, 1:12 PM   LOS: 2 days

## 2012-07-24 NOTE — Evaluation (Signed)
Physical Therapy Evaluation Patient Details Name: Maurice Delgado MRN: [id number] DOB: [birth year] Today's Date: 07/24/2012 Time: 4098-1191 PT Time Calculation (min): 30 min  PT Assessment / Plan / Recommendation Clinical Impression  Pt admitted with chronic hemoptysis, syncope, CAP, and LLE DVT on lovenox. Pt and wife state pt is at baseline functional level and able to demonstrate basic mobility without need for assist. However, pt demonstrates balance deficits particularly with challenges of single leg stance or tandem stance where pt requires support to maintain balance. Yet, pt states he puts on foot in the sink to bathe for prayer: pt, spouse and islam friends in room helping to discuss alternative methods for prayer preparation that could be done sitting down and to continue to discuss. Pt educated for fall risk and balance deficits and need to be seated for all bathing and dressing including prayer ritual and that OPPT recommended. Pt states understanding of education declines OPPT and states he will discuss with those in room changes to prayer ritual. Pt currently at basline function with education completed. No further acute needs at this time.     PT Assessment  All further PT needs can be met in the next venue of care    Follow Up Recommendations  Outpatient PT, pt currently declines    Does the patient have the potential to tolerate intense rehabilitation      Barriers to Discharge        Equipment Recommendations  None recommended by PT    Recommendations for Other Services     Frequency      Precautions / Restrictions Precautions Precautions: Fall   Pertinent Vitals/Pain No pain Vitals stable on 2-3L      Mobility  Bed Mobility Bed Mobility: Supine to Sit;Sit to Supine Supine to Sit: 6: Modified independent (Device/Increase time);HOB flat Sit to Supine: 6: Modified independent (Device/Increase time);HOB flat Transfers Transfers: Sit to Stand;Stand to Sit Sit  to Stand: 6: Modified independent (Device/Increase time);From bed Stand to Sit: 6: Modified independent (Device/Increase time);To bed Ambulation/Gait Ambulation/Gait Assistance: 6: Modified independent (Device/Increase time) Ambulation Distance (Feet): 350 Feet Assistive device: None Ambulation/Gait Assistance Details: Pt with one standing rest to recover oxygen saturations from 88-93% on 2L Gait Pattern: Step-through pattern;Decreased stride length Gait velocity: WFL Stairs: No    Shoulder Instructions     Exercises     PT Diagnosis: Difficulty walking  PT Problem List: Decreased safety awareness;Decreased balance PT Treatment Interventions:     PT Goals    Visit Information  Last PT Received On: 07/24/12 Assistance Needed: +1    Subjective Data  Subjective: I only have trouble washing my feet for prayer Patient Stated Goal: return home   Prior Functioning  Home Living Lives With: Spouse Available Help at Discharge: Family;Available PRN/intermittently Type of Home: House Home Access: Level entry Home Layout: One level Bathroom Shower/Tub: Engineer, manufacturing systems: Standard Home Adaptive Equipment: Dan Humphreys - four wheeled;Straight cane;Shower chair with back Prior Function Level of Independence: Needs assistance Needs Assistance: Light Housekeeping;Meal Prep Meal Prep: Total Light Housekeeping: Total Vocation: Retired Comments: Pt states he does not use AD in the home but uses cane when walking to [employer] Communication: HOH  Pt states he sits to shower and to dress but will stand with one foot in sink for washing for prayer.   Cognition  Overall Cognitive Status: Impaired Area of Impairment: Safety/judgement Arousal/Alertness: Awake/alert Orientation Level: Appears intact for tasks assessed Behavior During Session: [facility] for tasks performed Safety/Judgement: Decreased safety  judgement for tasks assessed    Extremity/Trunk Assessment Left  Upper Extremity Assessment LUE ROM/Strength/Tone: Deficits LUE ROM/Strength/Tone Deficits: limited shoulder ROM x 6yrs per pt Right Lower Extremity Assessment RLE ROM/Strength/Tone: WFL for tasks assessed RLE Sensation: WFL - Light Touch Left Lower Extremity Assessment LLE ROM/Strength/Tone: WFL for tasks assessed LLE Sensation: WFL - Light Touch Trunk Assessment Trunk Assessment: Normal   Balance Static Sitting Balance Static Sitting - Balance Support: No upper extremity supported;Feet supported Static Sitting - Level of Assistance: 7: Independent Static Sitting - Comment/# of Minutes: 10 Static Standing Balance Static Standing - Balance Support: No upper extremity supported Static Standing - Level of Assistance: 7: Independent Static Standing - Comment/# of Minutes: 3 Tandem Stance - Right Leg: 3  (with assist to achieve position) Tandem Stance - Left Leg: 1  Rhomberg - Eyes Opened: 40  Rhomberg - Eyes Closed: 15   End of Session PT - End of Session Equipment Utilized During Treatment: Gait belt;Oxygen Activity Tolerance: Patient tolerated treatment well Patient left: in bed;with call bell/phone within reach;with bed alarm set;with family/visitor present  GP     Toney Sang [facility] 07/24/2012, 3:53 PM  Delaney Meigs, PT [phone]

## 2012-07-24 NOTE — Progress Notes (Signed)
VASCULAR LAB PRELIMINARY  PRELIMINARY  PRELIMINARY  PRELIMINARY  Carotid duplex  completed.    Preliminary report:  Bilateral:  No evidence of hemodynamically significant internal carotid artery stenosis.   Vertebral artery flow is antegrade.  Velocities diminished, possibly cardiac related.    Maurice Delgado, RVT 07/24/2012, 9:27 AM

## 2012-07-25 DIAGNOSIS — R55 Syncope and collapse: Secondary | ICD-10-CM

## 2012-07-25 LAB — BASIC METABOLIC PANEL
CO2: 29 mEq/L (ref 19–32)
Calcium: 9.4 mg/dL (ref 8.4–10.5)
Creatinine, Ser: 1.47 mg/dL — ABNORMAL HIGH (ref 0.50–1.35)
Glucose, Bld: 99 mg/dL (ref 70–99)
Sodium: 141 mEq/L (ref 135–145)

## 2012-07-25 LAB — CBC
MCH: 26.7 pg (ref 26.0–34.0)
MCV: 85.5 fL (ref 78.0–100.0)
Platelets: 132 10*3/uL — ABNORMAL LOW (ref 150–400)
RBC: 4.76 MIL/uL (ref 4.22–5.81)

## 2012-07-25 LAB — CULTURE, RESPIRATORY W GRAM STAIN: Culture: NORMAL

## 2012-07-25 NOTE — Progress Notes (Signed)
[facility] PROGRESS NOTE  Ivie Maese ZOX:[id number] DOB: 15-Jul-1933 DOA: 07/22/2012 PCP: [name], MD Pulmonologist: Marcelyn Bruins, M.D. Primary Cardiologist Dr. Verdis Prime  Assessment/Plan: 1. RLL Community acquired pneumonia: Appears to be improving on empiric antibiotics.  2. Syncope: Etiology unclear - His ICD was interrogated by Leta Jungling with [employer] and per Dr. Mayford Knife was normal. No VT. May have been secondary to acute infection. 3. Hemoptysis - chronic: Chronic issue - Pulm following - thought to be secondary to left lower lobe  -  has had a negative vasculitis w/u in past  4. Hx of LLE DVT - s/p IVC filter: Unable to anticoag due to above - no acute complications  5. OBSTRUCTIVE SLEEP APNEA on home CPAP  6. HYPERTENSION: Stable. 7. COPD: Currently stable. 8. Atrial fibrillation: Stable. Continue aspirin. No warfarin secondary to hemoptysis. 9. Chronic systolic heart failure - Appears well compensated at present, but echo reveals signif decline in EF from reported baseline - cardiology evaluation appreciated 10. S/p BiV AICD 11. Chronic kidney disease stage III: Appears stable.  Code Status: Full code Family Communication: Discussed with wife at bedside Disposition Plan: Home when improved  Brendia Sacks, MD  [facility] Team 5 Pager [phone] If 7PM-7AM, please contact night-coverage at [email or web address], password [facility] 07/25/2012, 3:26 PM  LOS: 3 days   Brief narrative: 79 y/o male with PMH of COPD, OSA on CPAP, recurrent DVT s/p IVC filter, Afib - not on anticoagulation due to chronic hemoptysis, NICM with EF 35% s/p BiV ICD, chronic renal insufficiency who presented to [place] ER with two episodes of syncope. Reported he awoke and attempted to walk to restroom but passed out near the bed. Wife activated 911. On arrival, he tried to walk to ambulance and made it downstairs but had second episode of syncope at bottom of stairs. Did not have warning prior to  events. Reported he had been more short of breath than usual, chronic hemoptysis seemed to be worse. Hx of bronch with Dr. Shelle Iron and LLL has been culprit for bleeding. Specifically denied chest pain, pain with inspiration, nausea, vomiting, diarrhea, blood in stool, dark stool.  Consultants:  Pulmonology  Cardiology  Physical therapy: Outpatient PT  Procedures:  Bilateral artery Dopplers: Right: No evidence of DVT, superficial thrombosis, or Baker's cyst. Left: Non occlusive DVT of indeterminate age noted in the distal CFV, PFV, Pop v, and gastrocnemius v. No evidence of superficial thrombosis.  2-D echocardiogram: Left ventricle: The cavity size was moderately dilated. Systolic function was severely reduced. The estimated ejection fraction was in the range of 20% to 25%. Diffuse hypokinesis. Severe hypokinesis.  Bilateral carotid ultrasound: Preliminary report: Bilateral: No evidence of hemodynamically significant internal carotid artery stenosis. Vertebral artery flow is antegrade. Velocities diminished, possibly cardiac related   Antibiotics:  Vancomycin 1/22  Levaquin 1/20  HPI/Subjective: Afebrile, vital signs stable. Short of breath with ambulation.  Objective: Filed Vitals:   07/24/12 2153 07/24/12 2210 07/25/12 0500 07/25/12 1055  BP:   150/112 140/70  Pulse:  83 106 112  Temp:   98.2 F (36.8 C)   TempSrc:      Resp:  18 20   Height:      Weight:   129.457 kg (285 lb 6.4 oz)   SpO2: 93%  92%     Intake/Output Summary (Last 24 hours) at 07/25/12 1526 Last data filed at 07/25/12 0500  Gross per 24 hour  Intake      0 ml  Output   1325 ml  Net  -1325 ml   Filed Weights   07/22/12 1707 07/24/12 0627 07/25/12 0500  Weight: 127.4 kg (280 lb 13.9 oz) 129.6 kg (285 lb 11.5 oz) 129.457 kg (285 lb 6.4 oz)    Exam:  General:  Appears calm and comfortable. Ambulates without assistance. Cardiovascular: RRR, no m/r/g. No LE edema. Respiratory: CTA bilaterally,  no w/r/r. Normal respiratory effort. Psychiatric: grossly normal mood and affect, speech fluent and appropriate  Data Reviewed: Basic Metabolic Panel:  Lab 07/25/12 1610 07/23/12 0420 07/22/12 1134  NA 141 139 140  K 3.7 3.8 3.6  CL 103 104 102  CO2 29 27 27   GLUCOSE 99 107* 126*  BUN 19 20 17   CREATININE 1.47* 1.59* 1.59*  CALCIUM 9.4 8.9 9.2  MG -- -- --  PHOS -- -- --   Liver Function Tests:  Lab 07/22/12 1134  AST 20  ALT 15  ALKPHOS 83  BILITOT 1.1  PROT 6.5  ALBUMIN 3.2*   CBC:  Lab 07/25/12 0505 07/23/12 0420 07/22/12 1656 07/22/12 1134  WBC 5.0 7.2 11.2* 9.0  NEUTROABS -- -- -- 7.9*  HGB 12.7* 12.3* 13.1 13.8  HCT 40.7 38.0* 40.1 42.0  MCV 85.5 84.4 83.9 84.7  PLT 132* 104* 108* PLATELET CLUMPS NOTED ON SMEAR, COUNT APPEARS DECREASED   Cardiac Enzymes:  Lab 07/23/12 0420 07/22/12 2224 07/22/12 1656 07/22/12 1457  CKTOTAL -- -- -- --  CKMB -- -- -- --  CKMBINDEX -- -- -- --  TROPONINI <0.30 <0.30 <0.30 <0.30   BNP (last 3 results)  Basename 07/22/12 1656  PROBNP 4025.0*   CBG:  Lab 07/25/12 0023 07/24/12 2147  GLUCAP 120* 104*    Recent Results (from the past 240 hour(s))  CULTURE, BLOOD (ROUTINE X 2)     Status: Normal (Preliminary result)   Collection Time   07/22/12  2:56 PM      Component Value Range Status Comment   Specimen Description BLOOD RIGHT ARM   Final    Special Requests BOTTLES DRAWN AEROBIC AND ANAEROBIC 10CC   Final    Culture  Setup Time 07/23/2012 00:04   Final    Culture     Final    Value:        BLOOD CULTURE RECEIVED NO GROWTH TO DATE CULTURE WILL BE HELD FOR 5 DAYS BEFORE ISSUING A FINAL NEGATIVE REPORT   Report Status PENDING   Incomplete   CULTURE, BLOOD (ROUTINE X 2)     Status: Normal (Preliminary result)   Collection Time   07/22/12  3:15 PM      Component Value Range Status Comment   Specimen Description BLOOD RIGHT HAND   Final    Special Requests BOTTLES DRAWN AEROBIC AND ANAEROBIC 10CC   Final    Culture   Setup Time 07/23/2012 00:04   Final    Culture     Final    Value:        BLOOD CULTURE RECEIVED NO GROWTH TO DATE CULTURE WILL BE HELD FOR 5 DAYS BEFORE ISSUING A FINAL NEGATIVE REPORT   Report Status PENDING   Incomplete   CULTURE, RESPIRATORY     Status: Normal   Collection Time   07/22/12  4:21 PM      Component Value Range Status Comment   Specimen Description SPUTUM   Final    Special Requests NONE   Final    Gram Stain     Final    Value: FEW WBC PRESENT,BOTH PMN  AND MONONUCLEAR     MODERATE SQUAMOUS EPITHELIAL CELLS PRESENT     MODERATE GRAM POSITIVE COCCI IN CLUSTERS     IN PAIRS IN CHAINS RARE GRAM POSITIVE RODS   Culture NORMAL OROPHARYNGEAL FLORA   Final    Report Status 07/25/2012 FINAL   Final   MRSA PCR SCREENING     Status: Normal   Collection Time   07/22/12  5:40 PM      Component Value Range Status Comment   MRSA by PCR NEGATIVE  NEGATIVE Final   CULTURE, EXPECTORATED SPUTUM-ASSESSMENT     Status: Normal   Collection Time   07/23/12  7:41 PM      Component Value Range Status Comment   Specimen Description SPUTUM   Final    Special Requests NONE   Final    Sputum evaluation     Final    Value: THIS SPECIMEN IS ACCEPTABLE. RESPIRATORY CULTURE REPORT TO FOLLOW.   Report Status 07/23/2012 FINAL   Final   CULTURE, RESPIRATORY     Status: Normal (Preliminary result)   Collection Time   07/23/12  7:41 PM      Component Value Range Status Comment   Specimen Description SPUTUM   Final    Special Requests NONE   Final    Gram Stain     Final    Value: MODERATE WBC PRESENT,BOTH PMN AND MONONUCLEAR     FEW SQUAMOUS EPITHELIAL CELLS PRESENT     MODERATE GRAM POSITIVE RODS     RARE GRAM POSITIVE COCCI IN PAIRS     RARE YEAST   Culture NORMAL OROPHARYNGEAL FLORA   Final    Report Status PENDING   Incomplete      Studies: No results found.  Scheduled Meds:   . aspirin  81 mg Oral Daily  . calcitRIOL  0.5 mcg Oral QODAY  . chlorpheniramine-HYDROcodone  5 mL Oral  Q12H  . digoxin  125 mcg Oral Daily  . enoxaparin (LOVENOX) injection  0.5 mg/kg Subcutaneous Q24H  . febuxostat  80 mg Oral Daily  . furosemide  40 mg Oral Daily  . levofloxacin (LEVAQUIN) IV  750 mg Intravenous Q24H  . metoprolol succinate  25 mg Oral Daily  . pregabalin  150 mg Oral BID  . sodium chloride  3 mL Intravenous Q12H  . Tamsulosin HCl  0.4 mg Oral Daily  . tiotropium  18 mcg Inhalation Daily  . vancomycin  1,750 mg Intravenous Q24H   Continuous Infusions:   Principal Problem:  *Community acquired pneumonia Active Problems:  OBSTRUCTIVE SLEEP APNEA  HYPERTENSION  ABNORMAL HEART RHYTHMS  COPD (chronic obstructive pulmonary disease)  DYSPNEA  Chronic systolic dysfunction of left ventricle  Atrial fibrillation  Hemoptysis, unspecified  Syncope     Brendia Sacks, MD  [facility] Team 5 Pager [phone] If 7PM-7AM, please contact night-coverage at [email or web address], password [facility] 07/25/2012, 3:26 PM  LOS: 3 days   Time spent: 20 minutes

## 2012-07-25 NOTE — Progress Notes (Signed)
Subjective:  No further syncope, no bleeding, no SOB, no CP.   Objective:  Vital Signs in the last 24 hours: Temp:  [97.2 F (36.2 C)-98.4 F (36.9 C)] 98.2 F (36.8 C) (01/25 0500) Pulse Rate:  [44-114] 112  (01/25 1055) Resp:  [16-21] 20  (01/25 0500) BP: (123-150)/(70-112) 140/70 mmHg (01/25 1055) SpO2:  [92 %-95 %] 92 % (01/25 0500) Weight:  [129.457 kg (285 lb 6.4 oz)] 129.457 kg (285 lb 6.4 oz) (01/25 0500)  Intake/Output from previous day: 01/24 0701 - 01/25 0700 In: 720 [P.O.:720] Out: 1525 [Urine:1525]   Physical Exam: General: Well developed, well nourished, in no acute distress. Head:  Normocephalic and atraumatic. Lungs: Clear to auscultation and percussion. Heart:RR with occasional Ectopy No murmur, rubs or gallops.  Abdomen: soft, non-tender, positive bowel sounds. Extremities: No clubbing or cyanosis. No edema. Neurologic: Alert and oriented x 3.    Lab Results:  Basename 07/25/12 0505 07/23/12 0420  WBC 5.0 7.2  HGB 12.7* 12.3*  PLT 132* 104*    Basename 07/25/12 0505 07/23/12 0420  NA 141 139  K 3.7 3.8  CL 103 104  CO2 29 27  GLUCOSE 99 107*  BUN 19 20  CREATININE 1.47* 1.59*    Basename 07/23/12 0420 07/22/12 2224  TROPONINI <0.30 <0.30   Hepatic Function Panel  Basename 07/22/12 1134  PROT 6.5  ALBUMIN 3.2*  AST 20  ALT 15  ALKPHOS 83  BILITOT 1.1  BILIDIR --  IBILI --   No results found for this basename: CHOL in the last 72 hours No results found for this basename: PROTIME in the last 72 hours  Imaging: No results found. Personally viewed.   Telemetry: Frequent ectopy, no VT, paced.  Personally viewed.     Cardiac Studies:  EF 35%  Assessment/Plan:  Principal Problem:  *Community acquired pneumonia Active Problems:  OBSTRUCTIVE SLEEP APNEA  HYPERTENSION  ABNORMAL HEART RHYTHMS  COPD (chronic obstructive pulmonary disease)  DYSPNEA  Chronic systolic dysfunction of left ventricle  Atrial fibrillation  Hemoptysis, unspecified  Syncope   1. Unexplained syncope - possible orthostasis. His ICD was interrogated by Leta Jungling with [employer] and per Dr. Mayford Knife was normal. No VT. Troponin reassuring. Tele overnight with frequent ectopy, pacer functioning well. No VT. WBC improved. ?related to viral illness? Reviewed Dr. Mayford Knife note.   2. Chronic systolic HF - EF 16% - not on ACE-I I assume because of CKD. On low dose Bb. No changes.   3. AFIB - no anticoagulation due to prior bleeding.   4. ICD - device working well. No changes surrounding syncope.     SKAINS, MARK 07/25/2012, 11:13 AM

## 2012-07-26 LAB — VANCOMYCIN, TROUGH: Vancomycin Tr: 13.2 ug/mL (ref 10.0–20.0)

## 2012-07-26 LAB — CULTURE, RESPIRATORY W GRAM STAIN

## 2012-07-26 MED ORDER — VANCOMYCIN HCL 10 G IV SOLR
1500.0000 mg | Freq: Two times a day (BID) | INTRAVENOUS | Status: DC
  Administered 2012-07-27: 1500 mg via INTRAVENOUS
  Filled 2012-07-26 (×3): qty 1500

## 2012-07-26 MED ORDER — METOPROLOL SUCCINATE ER 50 MG PO TB24
50.0000 mg | ORAL_TABLET | Freq: Every day | ORAL | Status: DC
  Administered 2012-07-26 – 2012-07-27 (×2): 50 mg via ORAL
  Filled 2012-07-26 (×2): qty 1

## 2012-07-26 NOTE — Progress Notes (Signed)
Resp Care Note ; Pt not wanting to wear CPAP tonight.

## 2012-07-26 NOTE — Progress Notes (Signed)
[facility] PROGRESS NOTE  Maurice Delgado BMW:[id number] DOB: Sep 10, 1933 DOA: 07/22/2012 PCP: [name], MD Pulmonologist: Marcelyn Bruins, M.D. Primary Cardiologist: Dr. Verdis Prime  Assessment/Plan: 1. RLL Community acquired pneumonia: Appears resolved. Narrow to monotherapy? History of MRSA in sputum. 2. Syncope: Etiology unclear - His ICD was interrogated by Leta Jungling with [employer] and per Dr. Mayford Knife was normal. No VT. May have been secondary to acute infection. 3. Hemoptysis - chronic: Chronic issue, improved. Pulm following - thought to be secondary to left lower lobe  -  has had a negative vasculitis w/u in past  4. Hx of LLE DVT - s/p IVC filter: Unable to anticoag due to above - no acute complications  5. OBSTRUCTIVE SLEEP APNEA on home CPAP  6. HYPERTENSION: Stable. 7. COPD: Currently stable. 8. Atrial fibrillation: Stable. Continue aspirin. No warfarin secondary to hemoptysis. 9. Chronic systolic heart failure - Appears well compensated at present, but echo reveals significant decline in EF from reported baseline - cardiology evaluation appreciated 10. S/p BiV AICD 11. Chronic kidney disease stage III: Appears stable.  Code Status: Full code Family Communication: None present Disposition Plan: Home when improved, possibly 1/27  Brendia Sacks, MD  [facility] Team 5 Pager [phone] If 7PM-7AM, please contact night-coverage at [email or web address], password [facility] 07/26/2012, 10:25 AM  LOS: 4 days   Brief narrative: 79 y/o male with PMH of COPD, OSA on CPAP, recurrent DVT s/p IVC filter, Afib - not on anticoagulation due to chronic hemoptysis, NICM with EF 35% s/p BiV ICD, chronic renal insufficiency who presented to [facility] ER with two episodes of syncope. Reported he awoke and attempted to walk to restroom but passed out near the bed. Wife activated 911. On arrival, he tried to walk to ambulance and made it downstairs but had second episode of syncope at bottom of  stairs. Did not have warning prior to events. Reported he had been more short of breath than usual, chronic hemoptysis seemed to be worse. Hx of bronch with Dr. Shelle Iron and LLL has been culprit for bleeding. Specifically denied chest pain, pain with inspiration, nausea, vomiting, diarrhea, blood in stool, dark stool.  Consultants:  Pulmonology  Cardiology  Physical therapy: Outpatient PT  Procedures:  Bilateral artery Dopplers: Right: No evidence of DVT, superficial thrombosis, or Baker's cyst. Left: Non occlusive DVT of indeterminate age noted in the distal CFV, PFV, Pop v, and gastrocnemius v. No evidence of superficial thrombosis.  2-D echocardiogram: Left ventricle: The cavity size was moderately dilated. Systolic function was severely reduced. The estimated ejection fraction was in the range of 20% to 25%. Diffuse hypokinesis. Severe hypokinesis.  Bilateral carotid ultrasound: Preliminary report: Bilateral: No evidence of hemodynamically significant internal carotid artery stenosis. Vertebral artery flow is antegrade. Velocities diminished, possibly cardiac related   Antibiotics:  Vancomycin 1/22  Levaquin 1/22  HPI/Subjective: Afebrile, vital signs stable. Feels about the same. Hemoptysis has lessened.  Objective: Filed Vitals:   07/25/12 0500 07/25/12 1055 07/25/12 2143 07/26/12 0534  BP: 150/112 140/70 135/94 126/92  Pulse: 106 112 74 77  Temp: 98.2 F (36.8 C)  98.2 F (36.8 C) 98.3 F (36.8 C)  TempSrc:   Oral Oral  Resp: 20  20 18   Height:      Weight: 129.457 kg (285 lb 6.4 oz)   130 kg (286 lb 9.6 oz)  SpO2: 92%  96% 100%   No intake or output data in the 24 hours ending 07/26/12 1025 Filed Weights   07/24/12 0627 07/25/12 0500 07/26/12  0534  Weight: 129.6 kg (285 lb 11.5 oz) 129.457 kg (285 lb 6.4 oz) 130 kg (286 lb 9.6 oz)    Exam:  General:  Appears calm and comfortable.  Cardiovascular: RRR, no m/r/g.  Respiratory: CTA bilaterally, no w/r/r.  Normal respiratory effort. Psychiatric: grossly normal mood and affect, speech fluent and appropriate  Data Reviewed: Basic Metabolic Panel:  Lab 07/25/12 1610 07/23/12 0420 07/22/12 1134  NA 141 139 140  K 3.7 3.8 3.6  CL 103 104 102  CO2 29 27 27   GLUCOSE 99 107* 126*  BUN 19 20 17   CREATININE 1.47* 1.59* 1.59*  CALCIUM 9.4 8.9 9.2  MG -- -- --  PHOS -- -- --   Liver Function Tests:  Lab 07/22/12 1134  AST 20  ALT 15  ALKPHOS 83  BILITOT 1.1  PROT 6.5  ALBUMIN 3.2*   CBC:  Lab 07/25/12 0505 07/23/12 0420 07/22/12 1656 07/22/12 1134  WBC 5.0 7.2 11.2* 9.0  NEUTROABS -- -- -- 7.9*  HGB 12.7* 12.3* 13.1 13.8  HCT 40.7 38.0* 40.1 42.0  MCV 85.5 84.4 83.9 84.7  PLT 132* 104* 108* PLATELET CLUMPS NOTED ON SMEAR, COUNT APPEARS DECREASED   Cardiac Enzymes:  Lab 07/23/12 0420 07/22/12 2224 07/22/12 1656 07/22/12 1457  CKTOTAL -- -- -- --  CKMB -- -- -- --  CKMBINDEX -- -- -- --  TROPONINI <0.30 <0.30 <0.30 <0.30    Basename 07/22/12 1656  PROBNP 4025.0*   CBG:  Lab 07/25/12 0023 07/24/12 2147  GLUCAP 120* 104*    Recent Results (from the past 240 hour(s))  CULTURE, BLOOD (ROUTINE X 2)     Status: Normal (Preliminary result)   Collection Time   07/22/12  2:56 PM      Component Value Range Status Comment   Specimen Description BLOOD RIGHT ARM   Final    Special Requests BOTTLES DRAWN AEROBIC AND ANAEROBIC 10CC   Final    Culture  Setup Time 07/23/2012 00:04   Final    Culture     Final    Value:        BLOOD CULTURE RECEIVED NO GROWTH TO DATE CULTURE WILL BE HELD FOR 5 DAYS BEFORE ISSUING A FINAL NEGATIVE REPORT   Report Status PENDING   Incomplete   CULTURE, BLOOD (ROUTINE X 2)     Status: Normal (Preliminary result)   Collection Time   07/22/12  3:15 PM      Component Value Range Status Comment   Specimen Description BLOOD RIGHT HAND   Final    Special Requests BOTTLES DRAWN AEROBIC AND ANAEROBIC 10CC   Final    Culture  Setup Time 07/23/2012 00:04    Final    Culture     Final    Value:        BLOOD CULTURE RECEIVED NO GROWTH TO DATE CULTURE WILL BE HELD FOR 5 DAYS BEFORE ISSUING A FINAL NEGATIVE REPORT   Report Status PENDING   Incomplete   CULTURE, RESPIRATORY     Status: Normal   Collection Time   07/22/12  4:21 PM      Component Value Range Status Comment   Specimen Description SPUTUM   Final    Special Requests NONE   Final    Gram Stain     Final    Value: FEW WBC PRESENT,BOTH PMN AND MONONUCLEAR     MODERATE SQUAMOUS EPITHELIAL CELLS PRESENT     MODERATE GRAM POSITIVE COCCI IN CLUSTERS  IN PAIRS IN CHAINS RARE GRAM POSITIVE RODS   Culture NORMAL OROPHARYNGEAL FLORA   Final    Report Status 07/25/2012 FINAL   Final   MRSA PCR SCREENING     Status: Normal   Collection Time   07/22/12  5:40 PM      Component Value Range Status Comment   MRSA by PCR NEGATIVE  NEGATIVE Final   CULTURE, EXPECTORATED SPUTUM-ASSESSMENT     Status: Normal   Collection Time   07/23/12  7:41 PM      Component Value Range Status Comment   Specimen Description SPUTUM   Final    Special Requests NONE   Final    Sputum evaluation     Final    Value: THIS SPECIMEN IS ACCEPTABLE. RESPIRATORY CULTURE REPORT TO FOLLOW.   Report Status 07/23/2012 FINAL   Final   CULTURE, RESPIRATORY     Status: Normal (Preliminary result)   Collection Time   07/23/12  7:41 PM      Component Value Range Status Comment   Specimen Description SPUTUM   Final    Special Requests NONE   Final    Gram Stain     Final    Value: MODERATE WBC PRESENT,BOTH PMN AND MONONUCLEAR     FEW SQUAMOUS EPITHELIAL CELLS PRESENT     MODERATE GRAM POSITIVE RODS     RARE GRAM POSITIVE COCCI IN PAIRS     RARE YEAST   Culture NORMAL OROPHARYNGEAL FLORA   Final    Report Status PENDING   Incomplete      Studies: No results found.  Scheduled Meds:    . aspirin  81 mg Oral Daily  . calcitRIOL  0.5 mcg Oral QODAY  . chlorpheniramine-HYDROcodone  5 mL Oral Q12H  . digoxin  125 mcg Oral  Daily  . enoxaparin (LOVENOX) injection  0.5 mg/kg Subcutaneous Q24H  . febuxostat  80 mg Oral Daily  . furosemide  40 mg Oral Daily  . levofloxacin (LEVAQUIN) IV  750 mg Intravenous Q24H  . metoprolol succinate  25 mg Oral Daily  . pregabalin  150 mg Oral BID  . sodium chloride  3 mL Intravenous Q12H  . Tamsulosin HCl  0.4 mg Oral Daily  . tiotropium  18 mcg Inhalation Daily  . vancomycin  1,750 mg Intravenous Q24H   Continuous Infusions:   Principal Problem:  *Community acquired pneumonia Active Problems:  OBSTRUCTIVE SLEEP APNEA  HYPERTENSION  ABNORMAL HEART RHYTHMS  COPD (chronic obstructive pulmonary disease)  DYSPNEA  Chronic systolic dysfunction of left ventricle  Atrial fibrillation  Hemoptysis, unspecified  Syncope     Brendia Sacks, MD  [facility] Team 5 Pager [phone] If 7PM-7AM, please contact night-coverage at [email or web address], password [facility] 07/26/2012, 10:25 AM  LOS: 4 days   Time spent: 15 minutes

## 2012-07-26 NOTE — Progress Notes (Signed)
ANTIBIOTIC CONSULT NOTE - FOLLOW UP  Pharmacy Consult for Vancomycin and Levaquin Indication: pneumonia  No Known Allergies  Patient Measurements: Height: 6\' 2"  (188 cm) Weight: 286 lb 9.6 oz (130 kg) IBW/kg (Calculated) : 82.2   Vital Signs: Temp: 97.3 F (36.3 C) (01/26 1520) BP: 108/72 mmHg (01/26 1520) Pulse Rate: 67  (01/26 1520) Intake/Output from previous day:   Intake/Output from this shift:    Labs:  Basename 07/25/12 0505  WBC 5.0  HGB 12.7*  PLT 132*  LABCREA --  CREATININE 1.47*   Estimated Creatinine Clearance: 61.3 ml/min (by C-G formula based on Cr of 1.47).  Basename 07/26/12 1520  VANCOTROUGH 13.2     Microbiology: Recent Results (from the past 720 hour(s))  CULTURE, BLOOD (ROUTINE X 2)     Status: Normal (Preliminary result)   Collection Time   07/22/12  2:56 PM      Component Value Range Status Comment   Specimen Description BLOOD RIGHT ARM   Final    Special Requests BOTTLES DRAWN AEROBIC AND ANAEROBIC 10CC   Final    Culture  Setup Time 07/23/2012 00:04   Final    Culture     Final    Value:        BLOOD CULTURE RECEIVED NO GROWTH TO DATE CULTURE WILL BE HELD FOR 5 DAYS BEFORE ISSUING A FINAL NEGATIVE REPORT   Report Status PENDING   Incomplete   CULTURE, BLOOD (ROUTINE X 2)     Status: Normal (Preliminary result)   Collection Time   07/22/12  3:15 PM      Component Value Range Status Comment   Specimen Description BLOOD RIGHT HAND   Final    Special Requests BOTTLES DRAWN AEROBIC AND ANAEROBIC 10CC   Final    Culture  Setup Time 07/23/2012 00:04   Final    Culture     Final    Value:        BLOOD CULTURE RECEIVED NO GROWTH TO DATE CULTURE WILL BE HELD FOR 5 DAYS BEFORE ISSUING A FINAL NEGATIVE REPORT   Report Status PENDING   Incomplete   CULTURE, RESPIRATORY     Status: Normal   Collection Time   07/22/12  4:21 PM      Component Value Range Status Comment   Specimen Description SPUTUM   Final    Special Requests NONE   Final    Gram  Stain     Final    Value: FEW WBC PRESENT,BOTH PMN AND MONONUCLEAR     MODERATE SQUAMOUS EPITHELIAL CELLS PRESENT     MODERATE GRAM POSITIVE COCCI IN CLUSTERS     IN PAIRS IN CHAINS RARE GRAM POSITIVE RODS   Culture NORMAL OROPHARYNGEAL FLORA   Final    Report Status 07/25/2012 FINAL   Final   MRSA PCR SCREENING     Status: Normal   Collection Time   07/22/12  5:40 PM      Component Value Range Status Comment   MRSA by PCR NEGATIVE  NEGATIVE Final   CULTURE, EXPECTORATED SPUTUM-ASSESSMENT     Status: Normal   Collection Time   07/23/12  7:41 PM      Component Value Range Status Comment   Specimen Description SPUTUM   Final    Special Requests NONE   Final    Sputum evaluation     Final    Value: THIS SPECIMEN IS ACCEPTABLE. RESPIRATORY CULTURE REPORT TO FOLLOW.   Report Status 07/23/2012 FINAL  Final   CULTURE, RESPIRATORY     Status: Normal   Collection Time   07/23/12  7:41 PM      Component Value Range Status Comment   Specimen Description SPUTUM   Final    Special Requests NONE   Final    Gram Stain     Final    Value: MODERATE WBC PRESENT,BOTH PMN AND MONONUCLEAR     FEW SQUAMOUS EPITHELIAL CELLS PRESENT     MODERATE GRAM POSITIVE RODS     RARE GRAM POSITIVE COCCI IN PAIRS     RARE YEAST   Culture NORMAL OROPHARYNGEAL FLORA   Final    Report Status 07/26/2012 FINAL   Final     Anti-infectives     Start     Dose/Rate Route Frequency Ordered Stop   07/23/12 1530   vancomycin (VANCOCIN) 1,750 mg in sodium chloride 0.9 % 500 mL IVPB        1,750 mg 250 mL/hr over 120 Minutes Intravenous Every 24 hours 07/22/12 1520     07/22/12 1530   vancomycin (VANCOCIN) 2,500 mg in sodium chloride 0.9 % 500 mL IVPB        2,500 mg 250 mL/hr over 120 Minutes Intravenous  Once 07/22/12 1520 07/22/12 2011   07/22/12 1445   levofloxacin (LEVAQUIN) IVPB 750 mg        750 mg 100 mL/hr over 90 Minutes Intravenous Every 24 hours 07/22/12 1441            Assessment: 79 yo M on  Vancomycin and Levaquin for empiric PNA treatment.  Pt is afebrile, WBC trending down, renal function stable.  Vancomycin trough level is less than desired goal.  Based on kinetics and patient specific parameters will adjust dose to target goal 15-20.  Goal of Therapy:  Vancomycin trough level 15-20 mcg/ml  Plan:  Change Vancomycin to 1500 mg IV q12h. Estimated trough on this regimen is 18 mcg/ml.  Continue Levaquin 750 mg daily. Follow renal function, culture data, and clinical progress.  [employer], Pharm.D., BCPS Clinical Pharmacist Pager [phone] 07/26/2012 6:49 PM

## 2012-07-26 NOTE — Progress Notes (Signed)
Subjective:  No real changes, no syncope. He states that he is worried that he will fall if he tries to walk without help. No SOB.   Objective:  Vital Signs in the last 24 hours: Temp:  [98.2 F (36.8 C)-98.3 F (36.8 C)] 98.3 F (36.8 C) (01/26 0534) Pulse Rate:  [74-77] 77  (01/26 0534) Resp:  [18-20] 18  (01/26 0534) BP: (126-135)/(92-94) 126/92 mmHg (01/26 0534) SpO2:  [96 %-100 %] 100 % (01/26 0534) Weight:  [130 kg (286 lb 9.6 oz)] 130 kg (286 lb 9.6 oz) (01/26 0534)  Intake/Output from previous day:     Physical Exam: General: Well developed, well nourished, in no acute distress. Head:  Normocephalic and atraumatic. Lungs: Clear to auscultation and percussion. Heart: Irreg irreg.  No murmur, rubs or gallops.  Abdomen: soft, non-tender, positive bowel sounds. Extremities: No clubbing or cyanosis. 2-3+ edema L>R (keeps his left leg hanging off side of bed) Neurologic: Alert and oriented x 3.    Lab Results:  [facility] 07/25/12 0505  WBC 5.0  HGB 12.7*  PLT 132*    Basename 07/25/12 0505  NA 141  K 3.7  CL 103  CO2 29  GLUCOSE 99  BUN 19  CREATININE 1.47*     Telemetry: Occas RVR with AFIB.  Personally viewed.   Cardiac Studies:  ICD function normal.   Assessment/Plan:  Principal Problem:  *Community acquired pneumonia Active Problems:  OBSTRUCTIVE SLEEP APNEA  HYPERTENSION  ABNORMAL HEART RHYTHMS  COPD (chronic obstructive pulmonary disease)  DYSPNEA  Chronic systolic dysfunction of left ventricle  Atrial fibrillation  Hemoptysis, unspecified  Syncope  1. Unexplained syncope - possible orthostasis. His ICD was interrogated by Leta Jungling with [employer] and per Dr. Mayford Knife was normal. No VT. Troponin reassuring. Tele overnight with frequent ectopy, pacer functioning well. No VT. WBC improved. ?related to viral illness? Reviewed Dr. Mayford Knife note.  2. Chronic systolic HF - EF 29% - not on ACE-I I assume because of CKD. On  Bb. No changes.  3. AFIB - no  anticoagulation due to prior bleeding. Tachy at times. I will change him back to long acting metoprolol 50 QD. Continue digoxin.  4. ICD - device working well. No changes surrounding syncope.     SKAINS, MARK 07/26/2012, 11:05 AM

## 2012-07-27 DIAGNOSIS — N19 Unspecified kidney failure: Secondary | ICD-10-CM

## 2012-07-27 MED ORDER — LEVOFLOXACIN 750 MG PO TABS: 750.0000 mg | ORAL_TABLET | Freq: Every day | ORAL | Status: DC

## 2012-07-27 MED ORDER — HYDROCOD POLST-CHLORPHEN POLST 10-8 MG/5ML PO LQCR: 5.0000 mL | Freq: Two times a day (BID) | ORAL | Status: DC | PRN

## 2012-07-27 NOTE — Progress Notes (Signed)
Subjective: No significant sob this am.  Has cough, but not producting mucus or hemoptysis this am.  Objective: Vital signs in last 24 hours: Blood pressure 117/74, pulse 110, temperature 98.6 F (37 C), temperature source Oral, resp. rate 18, height 6\' 2"  (1.88 m), weight 287 lb 4.2 oz (130.3 kg), SpO2 96.00%.  Intake/Output from previous day:     Physical Exam:   ow male in nad Nose without purulence or discharge noted. OP clear Neck without TMG or LN Chest with right basilar crackles, no wheezing Cor with rrr abd soft, bs+ LE with mild edema, no cyanosis Alert and oriented, moves all 4.    Lab Results:  [facility] 07/25/12 0505  WBC 5.0  HGB 12.7*  HCT 40.7  PLT 132*   BMET  Basename 07/25/12 0505  NA 141  K 3.7  CL 103  CO2 29  GLUCOSE 99  BUN 19  CREATININE 1.47*  CALCIUM 9.4    Studies/Results: No results found.  Assessment/Plan:  1) chronic hemoptysis: The pt has had an issue with this for months, and is low grade.  He has had a bronch with heme staining in superior segment of LLL, but all samples negative except for culture + for MSSA.  He has been treated with multiple rounds of abx, and his coumadin has been stopped.  His autoimmune evaluation by renal was negative.  CHF was also in the differential given ct appearance late last year, but attempts at diuresis difficult due to renal insufficiency.  His current ct shows persistent IS and AS opacities at right >> left base, and the right is worse than prior ct.  His history is really not strong for PNA, but this is possible.   Would change him to oral levaquin for a total course of 10 days, and I would like to see him back in 2 weeks in the office.  2) COPD This issue is very stable currently.  Keep on usual home meds at discharge (spiriva and symbicort)    Pt is stable for discharge from pulmonary perspective, but ? Need further w/u of syncope?  Will leave to his attending md. Please call if questions  arise.  Barbaraann Share, M.D. 07/27/2012, 10:27 AM

## 2012-07-27 NOTE — Progress Notes (Signed)
DC orders received.  Patient stable with no S/S of distress.  Medication and discharge information reviewed with patient and patient's family.  Patient DC home with family. Maurice Delgado  

## 2012-07-27 NOTE — Discharge Summary (Signed)
Physician Discharge Summary  Maurice Delgado ZOX:[id number] DOB: 12-Jan-1934 DOA: 07/22/2012  PCP: [name], MD Pulmonologist: Marcelyn Bruins, M.D. Primary Cardiologist: Dr. Verdis Prime  Admit date: 07/22/2012 Discharge date: 07/27/2012  Recommendations for Outpatient Follow-up:  1. Followup resolution of pneumonia 2. Followup chronic hemoptysis 3. Followup chronic systolic heart failure  Follow-up Information    Follow up with [facility], MD. In 1 week.   Contact information:   [address] [place] [address] [phone]       Follow up with Barbaraann Share, MD. Schedule an appointment as soon as possible for a visit in 2 weeks.   Contact information:   [address] [place] [address] [phone]       Follow up with Lesleigh Noe, MD. In 3 weeks.   Contact information:   [address] [place] [address] [phone]         Discharge Diagnoses:  1. Right lower lobe community acquired pneumonia 2. Syncope 3. Chronic hemoptysis 4. Chronic systolic heart failure  Discharge Condition: Improved Disposition: Home, refused outpatient physical therapy  Diet recommendation: Heart healthy  Filed Weights   07/25/12 0500 07/26/12 0534 07/27/12 0500  Weight: 129.457 kg (285 lb 6.4 oz) 130 kg (286 lb 9.6 oz) 130.3 kg (287 lb 4.2 oz)    History of present illness:  79 y/o male with PMH of COPD, OSA on CPAP, recurrent DVT s/p IVC filter, Afib - not on anticoagulation due to chronic hemoptysis, NICM with EF 35% s/p BiV ICD, chronic renal insufficiency who presented to [facility] ER with two episodes of syncope. Reported he awoke and attempted to walk to restroom but passed out near the bed. Wife activated 911. On arrival, he tried to walk to ambulance and made it downstairs but had second episode of syncope at bottom of stairs. Did not have warning prior to events. Reported he had been more short of breath than usual, chronic hemoptysis seemed  to be worse. Hx of bronch with Dr. Shelle Iron and LLL has been culprit for bleeding. Specifically denied chest pain, pain with inspiration, nausea, vomiting, diarrhea, blood in stool, dark stool.  Hospital Course:  Mr. Cuffe was admitted for further evaluation of hemoptysis, syncope and pneumonia. He was seen by pulmonology and cardiology in consultation. He was started on broad-spectrum antibiotics with decreased hemoptysis. Pulmonology has recommended an additional 10 days of oral Levaquin to treat pneumonia. The etiology of his hemoptysis is unclear and detail of previous workup is as below. This issue. Stable at this point and can be followed up in the outpatient setting. The etiology of the syncope is also unclear, thought possibly to be secondary to orthostasis. ICD was interrogated as below and cardiology did not suggest any medication changes at this point. 2-D echocardiogram revealed depressed systolic function as detailed below. Patient seen by therapy will refuse outpatient physical therapy. He appears to be at baseline this point. 1. RLL Community acquired pneumonia: Appears resolved. Per pulmonology oral Levaquin for 10 days. Followup in 2 weeks.  2. Syncope: Etiology unclear, possibly orthostasis. ICD was interrogated by Leta Jungling with [employer] and per Dr. Mayford Knife was normal. No VT. May have been secondary to acute infection.  3. Hemoptysis - chronic: Chronic issue, improved. He has had a bronch with heme staining in superior segment of LLL, but all samples negative except for culture + for MSSA.  He has been treated with multiple rounds of abx, and his coumadin has been stopped.  His autoimmune  evaluation by renal was negative. No further inpatient evaluation per pulmonology.  4. Hx of LLE DVT - s/p IVC filter: Unable to anticoag due to above - no acute complications    5. OBSTRUCTIVE SLEEP APNEA on home CPAP   6. HYPERTENSION: Stable.  7. COPD: Currently stable.  8. Atrial fibrillation: Stable,  continue digoxin, long-acting metoprolol per cardiology. Continue aspirin. No warfarin secondary to hemoptysis.  9. Chronic systolic heart failure - Appears well compensated at present. Echo revealed significant decline in EF from reported baseline - cardiology evaluation appreciated. Continue beta blocker. Not on ACE inhibitor secondary to chronic kidney disease.  10. S/p BiV AICD  11. Chronic kidney disease stage III: Appears stable.  Consultants:  Pulmonology   Cardiology   Physical therapy: Outpatient PT, patient declined.  Procedures:  Bilateral artery Dopplers: No evidence of deep vein thrombosis involving the right lower extremity. - Findings consistent with indeterminate age deep vein thrombosis involving the left common femoral vein, left profunda femoris vein, and left popliteal vein.  2-D echocardiogram: Left ventricle: The cavity size was moderately dilated. Systolic function was severely reduced. The estimated ejection fraction was in the range of 20% to 25%. Diffuse hypokinesis. Severe hypokinesis.  Bilateral carotid ultrasound: No significant extracranial carotid artery stenosis demonstrated. Vertebrals are patent with antegrade flow.  Antibiotics:  Vancomycin 1/22 >> 1/27  Levaquin 1/22 >>   Discharge Instructions  Discharge Orders    Future Appointments: Provider: Department: Dept Phone: Center:   08/24/2012 10:05 AM [facility] Main [facility] [place]) [phone] [email or web address]     Future Orders Please Complete By Expires   Diet - low sodium heart healthy      Increase activity slowly      Discharge instructions      Comments:   Be sure to finish your antibiotic (Levaquin). Followup with your pulmonologist in 2 weeks. Take your time in making positional changes (for example from sitting to standing or standing to walking). Call your physician or seek immediate medical attention for increased bleeding, loss of consciousness or  worsening of condition.       Medication List     As of 07/27/2012  3:27 PM    TAKE these medications         aspirin 81 MG tablet   Take 81 mg by mouth daily.      calcitRIOL 0.5 MCG capsule   Commonly known as: ROCALTROL   Take 0.5 mcg by mouth every other day.      chlorpheniramine-HYDROcodone 10-8 MG/5ML Lqcr   Commonly known as: TUSSIONEX   Take 5 mLs by mouth every 12 (twelve) hours as needed (cough).      digoxin 0.125 MG tablet   Commonly known as: LANOXIN   Take 125 mcg by mouth daily.      furosemide 40 MG tablet   Commonly known as: LASIX   Take 40 mg by mouth daily.      levofloxacin 750 MG tablet   Commonly known as: LEVAQUIN   Take 1 tablet (750 mg total) by mouth daily. Start January 28 in the early afternoon.      metoprolol succinate 50 MG 24 hr tablet   Commonly known as: TOPROL-XL   Take 1 tablet (50 mg total) by mouth daily.      pregabalin 150 MG capsule   Commonly known as: LYRICA   Take 150 mg by mouth 2 (two) times daily.      SPIRIVA HANDIHALER 18 MCG  inhalation capsule   Generic drug: tiotropium   Place 18 mcg into inhaler and inhale daily.      Tamsulosin HCl 0.4 MG Caps   Commonly known as: FLOMAX   Take 0.4 mg by mouth daily.      ULORIC 80 MG Tabs   Generic drug: Febuxostat   Take 80 mg by mouth daily.      vitamin C 1000 MG tablet   Take 1,000 mg by mouth daily.      XOPENEX HFA 45 MCG/ACT inhaler   Generic drug: levalbuterol   Inhale 2 puffs into the lungs every 6 (six) hours as needed.        The results of significant diagnostics from this hospitalization (including imaging, microbiology, ancillary and laboratory) are listed below for reference.    Significant Diagnostic Studies: Ct Chest Wo Contrast  07/22/2012  *RADIOLOGY REPORT*  Clinical Data: Pulmonary infiltrates.  Chronic hemoptysis.  CT CHEST WITHOUT CONTRAST  Technique:  Multidetector CT imaging of the chest was performed following the standard protocol  without IV contrast.  Comparison: Chest x-ray dated 07/22/2012 and chest CT dated 05/19/2012  Findings: The patient has a patchy new alveolar infiltrate at the right lung base posteriorly as well as marked increased accentuation of the interstitial markings in the right lower lobe consistent with interstitial infiltrates superimposed on chronic interstitial and obstructive lung disease.  The patient has extensive emphysematous disease primarily in the upper lobes with chronic interstitial disease in both lower lobes.  No effusions.  New cardiomegaly.  Stable slight mediastinal adenopathy which is probably reactive.  IMPRESSION: Acute interstitial and alveolar infiltrate in the right lower lobe superimposed on chronic lung disease.   Original Report Authenticated By: Francene Boyers, M.D.    US Abdomen Limited  07/22/2012  *RADIOLOGY REPORT*  Clinical Data: Evaluate for clot at the level of the inferior vena cava.  Admitted with syncope.  LIMITED ABDOMINAL ULTRASOUND  Comparison:  None.  Findings: Inferior vena cava filter is in place.  At the mid to upper aspect of the inferior vena cava filter and above the inferior vena cava filter, no clot is visualized.  Distal aspect of the inferior vena cava filter and inferior to this level not visualized adequately secondary to overlying bowel gas.  IMPRESSION: Inferior vena cava filter is in place.  At the mid to upper aspect of the inferior vena cava filter and above the inferior vena cava filter, no clot is visualized.  Distal aspect of the inferior vena cava filter and inferior to this level not visualized adequately secondary to overlying bowel gas.   Original Report Authenticated By: Lacy Duverney, M.D.     Microbiology: Recent Results (from the past 240 hour(s))  CULTURE, BLOOD (ROUTINE X 2)     Status: Normal (Preliminary result)   Collection Time   07/22/12  2:56 PM      Component Value Range Status Comment   Specimen Description BLOOD RIGHT ARM   Final     Special Requests BOTTLES DRAWN AEROBIC AND ANAEROBIC 10CC   Final    Culture  Setup Time 07/23/2012 00:04   Final    Culture     Final    Value:        BLOOD CULTURE RECEIVED NO GROWTH TO DATE CULTURE WILL BE HELD FOR 5 DAYS BEFORE ISSUING A FINAL NEGATIVE REPORT   Report Status PENDING   Incomplete   CULTURE, BLOOD (ROUTINE X 2)     Status: Normal (Preliminary result)  Collection Time   07/22/12  3:15 PM      Component Value Range Status Comment   Specimen Description BLOOD RIGHT HAND   Final    Special Requests BOTTLES DRAWN AEROBIC AND ANAEROBIC 10CC   Final    Culture  Setup Time 07/23/2012 00:04   Final    Culture     Final    Value:        BLOOD CULTURE RECEIVED NO GROWTH TO DATE CULTURE WILL BE HELD FOR 5 DAYS BEFORE ISSUING A FINAL NEGATIVE REPORT   Report Status PENDING   Incomplete   CULTURE, RESPIRATORY     Status: Normal   Collection Time   07/22/12  4:21 PM      Component Value Range Status Comment   Specimen Description SPUTUM   Final    Special Requests NONE   Final    Gram Stain     Final    Value: FEW WBC PRESENT,BOTH PMN AND MONONUCLEAR     MODERATE SQUAMOUS EPITHELIAL CELLS PRESENT     MODERATE GRAM POSITIVE COCCI IN CLUSTERS     IN PAIRS IN CHAINS RARE GRAM POSITIVE RODS   Culture NORMAL OROPHARYNGEAL FLORA   Final    Report Status 07/25/2012 FINAL   Final   MRSA PCR SCREENING     Status: Normal   Collection Time   07/22/12  5:40 PM      Component Value Range Status Comment   MRSA by PCR NEGATIVE  NEGATIVE Final   CULTURE, EXPECTORATED SPUTUM-ASSESSMENT     Status: Normal   Collection Time   07/23/12  7:41 PM      Component Value Range Status Comment   Specimen Description SPUTUM   Final    Special Requests NONE   Final    Sputum evaluation     Final    Value: THIS SPECIMEN IS ACCEPTABLE. RESPIRATORY CULTURE REPORT TO FOLLOW.   Report Status 07/23/2012 FINAL   Final   CULTURE, RESPIRATORY     Status: Normal   Collection Time   07/23/12  7:41 PM       Component Value Range Status Comment   Specimen Description SPUTUM   Final    Special Requests NONE   Final    Gram Stain     Final    Value: MODERATE WBC PRESENT,BOTH PMN AND MONONUCLEAR     FEW SQUAMOUS EPITHELIAL CELLS PRESENT     MODERATE GRAM POSITIVE RODS     RARE GRAM POSITIVE COCCI IN PAIRS     RARE YEAST   Culture NORMAL OROPHARYNGEAL FLORA   Final    Report Status 07/26/2012 FINAL   Final      Labs: Basic Metabolic Panel:  Lab 07/25/12 1610 07/23/12 0420 07/22/12 1134  NA 141 139 140  K 3.7 3.8 3.6  CL 103 104 102  CO2 29 27 27   GLUCOSE 99 107* 126*  BUN 19 20 17   CREATININE 1.47* 1.59* 1.59*  CALCIUM 9.4 8.9 9.2  MG -- -- --  PHOS -- -- --   Liver Function Tests:  Lab 07/22/12 1134  AST 20  ALT 15  ALKPHOS 83  BILITOT 1.1  PROT 6.5  ALBUMIN 3.2*   CBC:  Lab 07/25/12 0505 07/23/12 0420 07/22/12 1656 07/22/12 1134  WBC 5.0 7.2 11.2* 9.0  NEUTROABS -- -- -- 7.9*  HGB 12.7* 12.3* 13.1 13.8  HCT 40.7 38.0* 40.1 42.0  MCV 85.5 84.4 83.9 84.7  PLT 132* 104*  108* PLATELET CLUMPS NOTED ON SMEAR, COUNT APPEARS DECREASED   Cardiac Enzymes:  Lab 07/23/12 0420 07/22/12 2224 07/22/12 1656 07/22/12 1457  CKTOTAL -- -- -- --  CKMB -- -- -- --  CKMBINDEX -- -- -- --  TROPONINI <0.30 <0.30 <0.30 <0.30     Basename 07/22/12 1656  PROBNP 4025.0*   CBG:  Lab 07/25/12 0023 07/24/12 2147  GLUCAP 120* 104*    Principal Problem:  *Community acquired pneumonia Active Problems:  OBSTRUCTIVE SLEEP APNEA  HYPERTENSION  ABNORMAL HEART RHYTHMS  COPD (chronic obstructive pulmonary disease)  DYSPNEA  Chronic systolic dysfunction of left ventricle  Atrial fibrillation  Hemoptysis, unspecified  Syncope   Time coordinating discharge: 35 minutes  Signed:  Brendia Sacks, MD [facility] 07/27/2012, 3:27 PM

## 2012-07-27 NOTE — Care Management Note (Signed)
    Page 1 of 1   07/27/2012     3:46:39 PM   CARE MANAGEMENT NOTE 07/27/2012  Patient:  Maurice Delgado, Maurice Delgado   Account Number:  [id number]  Date Initiated:  07/27/2012  Documentation initiated by:  GRAVES-BIGELOW,Lopez Dentinger  Subjective/Objective Assessment:   Pt admitted for treatment of PNA.     Action/Plan:   Pt plans for d/c today. Per PT notes needs outpatient services and is declining now. CM did speak to pt and he is declinin any HH services.   Anticipated DC Date:  07/27/2012   Anticipated DC Plan:  HOME/SELF CARE      DC Planning Services  CM consult  Patient refused services      Choice offered to / List presented to:             Status of service:  Completed, signed off Medicare Important Message given?   (If response is "NO", the following Medicare IM given date fields will be blank) Date Medicare IM given:   Date Additional Medicare IM given:    Discharge Disposition:  HOME/SELF CARE  Per UR Regulation:  Reviewed for med. necessity/level of care/duration of stay  If discussed at Long Length of Stay Meetings, dates discussed:    Comments:

## 2012-07-27 NOTE — Progress Notes (Signed)
[facility] PROGRESS NOTE  Maurice Delgado ZOX:[id number] DOB: 04-Oct-1933 DOA: 07/22/2012 PCP: [name], MD Pulmonologist: Marcelyn Bruins, M.D. Primary Cardiologist: Dr. Verdis Prime  Assessment/Plan: 1. RLL Community acquired pneumonia: Appears resolved. Per pulmonology oral Levaquin for 10 days. Followup in 2 weeks. 2. Syncope: Etiology unclear, possibly orthostasis. ICD was interrogated by Leta Jungling with [employer] and per Dr. Mayford Knife was normal. No VT. May have been secondary to acute infection. 3. Hemoptysis - chronic: Chronic issue, improved. No further inpatient evaluation per pulmonology. 4. Hx of LLE DVT - s/p IVC filter: Unable to anticoag due to above - no acute complications  5. OBSTRUCTIVE SLEEP APNEA on home CPAP  6. HYPERTENSION: Stable. 7. COPD: Currently stable. 8. Atrial fibrillation: Stable, continue digoxin, long-acting metoprolol per cardiology. Continue aspirin. No warfarin secondary to hemoptysis. 9. Chronic systolic heart failure - Appears well compensated at present. Echo revealed significant decline in EF from reported baseline - cardiology evaluation appreciated. Continue beta blocker, and ACE inhibitor secondary to chronic kidney disease. 10. S/p BiV AICD 11. Chronic kidney disease stage III: Appears stable.  Code Status: Full code Family Communication: None present Disposition Plan: Home today.  Brendia Sacks, MD  [facility] Team 5 Pager [phone] If 7PM-7AM, please contact night-coverage at [email or web address], password [email or web address] 07/27/2012, 3:02 PM  LOS: 5 days   Brief narrative: 79 y/o male with PMH of COPD, OSA on CPAP, recurrent DVT s/p IVC filter, Afib - not on anticoagulation due to chronic hemoptysis, NICM with EF 35% s/p BiV ICD, chronic renal insufficiency who presented to [facility] ER with two episodes of syncope. Reported he awoke and attempted to walk to restroom but passed out near the bed. Wife activated 911. On arrival, he tried to walk to  ambulance and made it downstairs but had second episode of syncope at bottom of stairs. Did not have warning prior to events. Reported he had been more short of breath than usual, chronic hemoptysis seemed to be worse. Hx of bronch with Dr. Shelle Iron and LLL has been culprit for bleeding. Specifically denied chest pain, pain with inspiration, nausea, vomiting, diarrhea, blood in stool, dark stool.  Consultants:  Pulmonology  Cardiology  Physical therapy: Outpatient PT, patient declined.  Procedures:  Bilateral artery Dopplers: No evidence of deep vein thrombosis involving the right lower extremity. - Findings consistent with indeterminate age deep vein thrombosis involving the left common femoral vein, left profunda femoris vein, and left popliteal vein.   2-D echocardiogram: Left ventricle: The cavity size was moderately dilated. Systolic function was severely reduced. The estimated ejection fraction was in the range of 20% to 25%. Diffuse hypokinesis. Severe hypokinesis.  Bilateral carotid ultrasound: No significant extracranial carotid artery stenosis demonstrated. Vertebrals are patent with antegrade flow.  Antibiotics:  Vancomycin 1/22  Levaquin 1/22  HPI/Subjective: Afebrile, vital signs stable.Less hemoptysis.  Objective: Filed Vitals:   07/27/12 0720 07/27/12 0933 07/27/12 0939 07/27/12 1410  BP: 131/92  117/74 135/73  Pulse:   110 69  Temp: 98.6 F (37 C)   98.4 F (36.9 C)  TempSrc: Oral   Oral  Resp:    20  Height:    6' 2.5" (1.892 m)  Weight:      SpO2: 97% 96%  98%    Intake/Output Summary (Last 24 hours) at 07/27/12 1502 Last data filed at 07/27/12 1418  Gross per 24 hour  Intake    510 ml  Output    300 ml  Net    210 ml   Filed  Weights   07/25/12 0500 07/26/12 0534 07/27/12 0500  Weight: 129.457 kg (285 lb 6.4 oz) 130 kg (286 lb 9.6 oz) 130.3 kg (287 lb 4.2 oz)    Exam:  General:  Appears calm and comfortable.  Cardiovascular: RRR, no  m/r/g. 2+ bilateral lower extremity edema. Telemetry: Paced rhythm. Respiratory: Decreased breath sounds but CTA bilaterally, no w/r/r. Normal respiratory effort. Psychiatric: grossly normal mood and affect, speech fluent and appropriate  Data Reviewed: Basic Metabolic Panel:  Lab 07/25/12 1191 07/23/12 0420 07/22/12 1134  NA 141 139 140  K 3.7 3.8 3.6  CL 103 104 102  CO2 29 27 27   GLUCOSE 99 107* 126*  BUN 19 20 17   CREATININE 1.47* 1.59* 1.59*  CALCIUM 9.4 8.9 9.2  MG -- -- --  PHOS -- -- --   Liver Function Tests:  Lab 07/22/12 1134  AST 20  ALT 15  ALKPHOS 83  BILITOT 1.1  PROT 6.5  ALBUMIN 3.2*   CBC:  Lab 07/25/12 0505 07/23/12 0420 07/22/12 1656 07/22/12 1134  WBC 5.0 7.2 11.2* 9.0  NEUTROABS -- -- -- 7.9*  HGB 12.7* 12.3* 13.1 13.8  HCT 40.7 38.0* 40.1 42.0  MCV 85.5 84.4 83.9 84.7  PLT 132* 104* 108* PLATELET CLUMPS NOTED ON SMEAR, COUNT APPEARS DECREASED   Cardiac Enzymes:  Lab 07/23/12 0420 07/22/12 2224 07/22/12 1656 07/22/12 1457  CKTOTAL -- -- -- --  CKMB -- -- -- --  CKMBINDEX -- -- -- --  TROPONINI <0.30 <0.30 <0.30 <0.30    Basename 07/22/12 1656  PROBNP 4025.0*   CBG:  Lab 07/25/12 0023 07/24/12 2147  GLUCAP 120* 104*    Recent Results (from the past 240 hour(s))  CULTURE, BLOOD (ROUTINE X 2)     Status: Normal (Preliminary result)   Collection Time   07/22/12  2:56 PM      Component Value Range Status Comment   Specimen Description BLOOD RIGHT ARM   Final    Special Requests BOTTLES DRAWN AEROBIC AND ANAEROBIC 10CC   Final    Culture  Setup Time 07/23/2012 00:04   Final    Culture     Final    Value:        BLOOD CULTURE RECEIVED NO GROWTH TO DATE CULTURE WILL BE HELD FOR 5 DAYS BEFORE ISSUING A FINAL NEGATIVE REPORT   Report Status PENDING   Incomplete   CULTURE, BLOOD (ROUTINE X 2)     Status: Normal (Preliminary result)   Collection Time   07/22/12  3:15 PM      Component Value Range Status Comment   Specimen Description  BLOOD RIGHT HAND   Final    Special Requests BOTTLES DRAWN AEROBIC AND ANAEROBIC 10CC   Final    Culture  Setup Time 07/23/2012 00:04   Final    Culture     Final    Value:        BLOOD CULTURE RECEIVED NO GROWTH TO DATE CULTURE WILL BE HELD FOR 5 DAYS BEFORE ISSUING A FINAL NEGATIVE REPORT   Report Status PENDING   Incomplete   CULTURE, RESPIRATORY     Status: Normal   Collection Time   07/22/12  4:21 PM      Component Value Range Status Comment   Specimen Description SPUTUM   Final    Special Requests NONE   Final    Gram Stain     Final    Value: FEW WBC PRESENT,BOTH PMN AND MONONUCLEAR  MODERATE SQUAMOUS EPITHELIAL CELLS PRESENT     MODERATE GRAM POSITIVE COCCI IN CLUSTERS     IN PAIRS IN CHAINS RARE GRAM POSITIVE RODS   Culture NORMAL OROPHARYNGEAL FLORA   Final    Report Status 07/25/2012 FINAL   Final   MRSA PCR SCREENING     Status: Normal   Collection Time   07/22/12  5:40 PM      Component Value Range Status Comment   MRSA by PCR NEGATIVE  NEGATIVE Final   CULTURE, EXPECTORATED SPUTUM-ASSESSMENT     Status: Normal   Collection Time   07/23/12  7:41 PM      Component Value Range Status Comment   Specimen Description SPUTUM   Final    Special Requests NONE   Final    Sputum evaluation     Final    Value: THIS SPECIMEN IS ACCEPTABLE. RESPIRATORY CULTURE REPORT TO FOLLOW.   Report Status 07/23/2012 FINAL   Final   CULTURE, RESPIRATORY     Status: Normal   Collection Time   07/23/12  7:41 PM      Component Value Range Status Comment   Specimen Description SPUTUM   Final    Special Requests NONE   Final    Gram Stain     Final    Value: MODERATE WBC PRESENT,BOTH PMN AND MONONUCLEAR     FEW SQUAMOUS EPITHELIAL CELLS PRESENT     MODERATE GRAM POSITIVE RODS     RARE GRAM POSITIVE COCCI IN PAIRS     RARE YEAST   Culture NORMAL OROPHARYNGEAL FLORA   Final    Report Status 07/26/2012 FINAL   Final      Studies: No results found.  Scheduled Meds:    . aspirin  81 mg  Oral Daily  . calcitRIOL  0.5 mcg Oral QODAY  . chlorpheniramine-HYDROcodone  5 mL Oral Q12H  . digoxin  125 mcg Oral Daily  . enoxaparin (LOVENOX) injection  0.5 mg/kg Subcutaneous Q24H  . febuxostat  80 mg Oral Daily  . furosemide  40 mg Oral Daily  . levofloxacin (LEVAQUIN) IV  750 mg Intravenous Q24H  . metoprolol succinate  50 mg Oral Daily  . pregabalin  150 mg Oral BID  . sodium chloride  3 mL Intravenous Q12H  . Tamsulosin HCl  0.4 mg Oral Daily  . tiotropium  18 mcg Inhalation Daily  . vancomycin  1,500 mg Intravenous Q12H   Continuous Infusions:   Principal Problem:  *Community acquired pneumonia Active Problems:  OBSTRUCTIVE SLEEP APNEA  HYPERTENSION  ABNORMAL HEART RHYTHMS  COPD (chronic obstructive pulmonary disease)  DYSPNEA  Chronic systolic dysfunction of left ventricle  Atrial fibrillation  Hemoptysis, unspecified  Syncope     Brendia Sacks, MD  [facility] Team 5 Pager [phone] If 7PM-7AM, please contact night-coverage at [email or web address], password [facility] 07/27/2012, 3:02 PM  LOS: 5 days

## 2012-07-29 LAB — CULTURE, BLOOD (ROUTINE X 2): Culture: NO GROWTH

## 2012-08-24 ENCOUNTER — Encounter: Payer: Medicare Other | Admitting: *Deleted

## 2012-09-03 ENCOUNTER — Encounter: Payer: Self-pay | Admitting: *Deleted

## 2012-10-05 ENCOUNTER — Ambulatory Visit (INDEPENDENT_AMBULATORY_CARE_PROVIDER_SITE_OTHER): Payer: Medicare Other | Admitting: *Deleted

## 2012-10-05 ENCOUNTER — Telehealth: Payer: Self-pay | Admitting: Internal Medicine

## 2012-10-05 ENCOUNTER — Encounter: Payer: Self-pay | Admitting: Internal Medicine

## 2012-10-05 ENCOUNTER — Other Ambulatory Visit: Payer: Self-pay | Admitting: Internal Medicine

## 2012-10-05 DIAGNOSIS — Z95810 Presence of automatic (implantable) cardiac defibrillator: Secondary | ICD-10-CM

## 2012-10-05 DIAGNOSIS — I519 Heart disease, unspecified: Secondary | ICD-10-CM

## 2012-10-05 DIAGNOSIS — R0602 Shortness of breath: Secondary | ICD-10-CM

## 2012-10-05 NOTE — Telephone Encounter (Signed)
Pt to try and hook up machine and send transmission. Pt to be called by 230 if not received.

## 2012-10-05 NOTE — Telephone Encounter (Signed)
Transmission received/kwm

## 2012-10-05 NOTE — Telephone Encounter (Signed)
New Prob   Pt stated he got a letter in regards to his device and our office not receiving a signal. Would like to speak to someone.

## 2012-10-06 LAB — REMOTE ICD DEVICE
AL AMPLITUDE: 2.9 mv
AL THRESHOLD: 0.75 V
BATTERY VOLTAGE: 2.9919 V
LV LEAD IMPEDENCE ICD: 532 Ohm
RV LEAD AMPLITUDE: 20 mv
RV LEAD IMPEDENCE ICD: 285 Ohm
RV LEAD THRESHOLD: 1.25 V
TOT-0006: 20110502000000
TZAT-0001ATACH: 2
TZAT-0001SLOWVT: 1
TZAT-0001SLOWVT: 2
TZAT-0002ATACH: NEGATIVE
TZAT-0002ATACH: NEGATIVE
TZAT-0002ATACH: NEGATIVE
TZAT-0002FASTVT: NEGATIVE
TZAT-0005SLOWVT: 88 pct
TZAT-0011SLOWVT: 10 ms
TZAT-0011SLOWVT: 10 ms
TZAT-0012ATACH: 150 ms
TZAT-0012FASTVT: 200 ms
TZAT-0018ATACH: NEGATIVE
TZAT-0018SLOWVT: NEGATIVE
TZAT-0018SLOWVT: NEGATIVE
TZAT-0019ATACH: 6 V
TZAT-0019ATACH: 6 V
TZAT-0019ATACH: 6 V
TZAT-0019FASTVT: 8 V
TZAT-0019SLOWVT: 8 V
TZAT-0019SLOWVT: 8 V
TZAT-0020ATACH: 1.5 ms
TZAT-0020ATACH: 1.5 ms
TZAT-0020FASTVT: 1.5 ms
TZON-0003VSLOWVT: 400 ms
TZON-0004VSLOWVT: 32
TZST-0001ATACH: 5
TZST-0001FASTVT: 5
TZST-0001SLOWVT: 3
TZST-0001SLOWVT: 4
TZST-0001SLOWVT: 5
TZST-0002ATACH: NEGATIVE
TZST-0002ATACH: NEGATIVE
TZST-0002FASTVT: NEGATIVE
TZST-0002FASTVT: NEGATIVE
TZST-0002FASTVT: NEGATIVE
TZST-0003SLOWVT: 25 J
TZST-0003SLOWVT: 35 J
VENTRICULAR PACING ICD: 98.66 pct
VF: 0

## 2012-10-07 ENCOUNTER — Ambulatory Visit (INDEPENDENT_AMBULATORY_CARE_PROVIDER_SITE_OTHER)
Admission: RE | Admit: 2012-10-07 | Discharge: 2012-10-07 | Disposition: A | Discharge status: Home / Self Care | Payer: Medicare Other | Source: Ambulatory Visit | Attending: Pulmonary Disease | Admitting: Pulmonary Disease

## 2012-10-07 ENCOUNTER — Other Ambulatory Visit: Payer: Medicare Other

## 2012-10-07 ENCOUNTER — Ambulatory Visit (INDEPENDENT_AMBULATORY_CARE_PROVIDER_SITE_OTHER): Payer: Medicare Other | Admitting: Pulmonary Disease

## 2012-10-07 ENCOUNTER — Encounter: Payer: Self-pay | Admitting: Pulmonary Disease

## 2012-10-07 VITALS — BP 130/78 | HR 107 | Temp 97.3°F | Wt 273.0 lb

## 2012-10-07 DIAGNOSIS — J449 Chronic obstructive pulmonary disease, unspecified: Secondary | ICD-10-CM

## 2012-10-07 DIAGNOSIS — R042 Hemoptysis: Secondary | ICD-10-CM

## 2012-10-07 NOTE — Assessment & Plan Note (Signed)
The patient has had an aggressive workup for this issue, with no specific problem being found.  If he has any worsening symptoms, he may need a repeat bronchoscopy and possibly another scan of his chest.

## 2012-10-07 NOTE — Progress Notes (Signed)
  Subjective:    Patient ID: Maurice Delgado, male    DOB: [birth year], 79 y.o.   MRN: [id number]  HPI Patient comes in today for followup of his known COPD, and history of chronic hemoptysis.  His breathing overall has been near his usual baseline, but he has not been staying compliant with his oxygen or symbicort.  He has not had an acute exacerbation or pulmonary infections since his admission in January for right lower lobe pneumonia.  He never followed up with me after that admission, and will need a followup chest x-ray.  Overall, his degree of hemoptysis has significantly decreased, but he will still have episodes primarily at night.  The quantity is minimal, and he denies any chest discomfort.  The patient has had an autoimmune workup that was unrevealing, and has also had an ultrasound evaluation of his IVC filter without chronic clot being present.   Review of Systems  Constitutional: Negative for fever and unexpected weight change.  HENT: Negative for ear pain, nosebleeds, congestion, sore throat, rhinorrhea, sneezing, trouble swallowing, dental problem, postnasal drip and sinus pressure.   Eyes: Negative for redness and itching.  Respiratory: Positive for cough ( hemoptysis has subsided) and shortness of breath. Negative for chest tightness and wheezing.   Cardiovascular: Negative for palpitations and leg swelling.  Gastrointestinal: Negative for nausea and vomiting.  Genitourinary: Negative for dysuria.  Musculoskeletal: Negative for joint swelling.  Skin: Negative for rash.  Neurological: Positive for syncope. Negative for headaches.  Hematological: Does not bruise/bleed easily.  Psychiatric/Behavioral: Negative for dysphoric mood. The patient is not nervous/anxious.        Objective:   Physical Exam Obese male in no acute distress   Nose without purulent discharge noted Neck without lymphadenopathy or thyromegaly Chest with adequate airflow, no active wheezing, mild basilar  crackles. Cardiac exam with irregular rhythm but controlled ventricular response Lower extremities with 1-2+ pedal edema, no cyanosis Alert and oriented, moves all 4 extremities.       Assessment & Plan:

## 2012-10-07 NOTE — Assessment & Plan Note (Signed)
I've encouraged the patient to be more compliant on a consistent basis with his aggressive bronchodilator regimen, and also to stay on his oxygen.  He has no bronchospasm on exam today, adequate saturations on oxygen, and no increased work of breathing.  We'll check a chest x-ray today since he has not had one since his hospitalization for pneumonia.

## 2012-10-07 NOTE — Patient Instructions (Addendum)
Stay on spiriva one inhalation each am Get back on symbicort 2 puffs each am and pm EVERYDAY.  Rinse mouth well.  Can use albuterol for rescue, but let me know if you are needing this more than twice a day on a consistent basis. Will check a chest xray today, and call you with results. Make sure you keep your apptm with Dr. Katrinka Blazing, and let him know about your "falling out spells". followup with me again in 4mos, but let me know if breathing worsens or if you cough up an increased quantity of blood.

## 2012-10-13 ENCOUNTER — Encounter: Payer: Self-pay | Admitting: *Deleted

## 2012-10-19 ENCOUNTER — Encounter: Payer: Self-pay | Admitting: *Deleted

## 2012-10-20 ENCOUNTER — Telehealth: Payer: Self-pay | Admitting: Pulmonary Disease

## 2012-10-20 NOTE — Telephone Encounter (Signed)
ATC patient at home number spoke with someone who stated to call the [phone] Called that number, no answer [facility]

## 2012-10-21 NOTE — Telephone Encounter (Signed)
LMTCB for pt 

## 2012-10-22 NOTE — Telephone Encounter (Signed)
Spoke with patient-- Patient states he is coughing up blood more frequently since he was last here 10/07/12. Patient states every time he coughs his chest starts hurting and the only relief he can get is to sit w upper body elevated. Patient states he is coughing up "globs" approx every hour about the size of a quarter to half dollar. Mostly occuring in the morning and evening Per patient this has been an onging thing he and Dr. Shelle Iron have been dealing with for sometime now And at this time he thinks its time to go ahead with biopsy procedure. Dr. Shelle Iron please advise, thank you

## 2012-10-23 ENCOUNTER — Other Ambulatory Visit: Payer: Self-pay | Admitting: Pulmonary Disease

## 2012-10-23 DIAGNOSIS — R042 Hemoptysis: Secondary | ICD-10-CM

## 2012-10-23 NOTE — Telephone Encounter (Signed)
Pt has started coughing up a little more blood, but this started after he fell down stairs 2 weeks ago.  He hit chest and back, is sore, but no bruising.  No increased sob.  He wishes to consider lung biopsy Will need ct chest first thing Monday, then will speak with his cardiologist, then him.  I have stressed to him to go to ER immediately if he begins to cough up more blood, if he gets more sob, or if something changes in his condition.

## 2012-10-26 NOTE — Telephone Encounter (Signed)
Will close msg as Dr.Clance addressed this matter and  has spoken with patient. Ct has been ordered and scheduled.

## 2012-10-27 ENCOUNTER — Ambulatory Visit (INDEPENDENT_AMBULATORY_CARE_PROVIDER_SITE_OTHER)
Admission: RE | Admit: 2012-10-27 | Discharge: 2012-10-27 | Disposition: A | Discharge status: Home / Self Care | Payer: Medicare Other | Source: Ambulatory Visit | Attending: Pulmonary Disease | Admitting: Pulmonary Disease

## 2012-10-27 DIAGNOSIS — R042 Hemoptysis: Secondary | ICD-10-CM

## 2012-10-29 ENCOUNTER — Other Ambulatory Visit: Payer: Self-pay | Admitting: Pulmonary Disease

## 2012-10-29 DIAGNOSIS — R918 Other nonspecific abnormal finding of lung field: Secondary | ICD-10-CM | POA: Insufficient documentation

## 2012-10-29 DIAGNOSIS — R042 Hemoptysis: Secondary | ICD-10-CM

## 2012-10-30 ENCOUNTER — Ambulatory Visit: Payer: Medicare Other | Admitting: Cardiothoracic Surgery

## 2012-11-05 ENCOUNTER — Encounter: Payer: Self-pay | Admitting: *Deleted

## 2012-11-05 ENCOUNTER — Encounter: Payer: Self-pay | Admitting: Cardiothoracic Surgery

## 2012-11-05 ENCOUNTER — Institutional Professional Consult (permissible substitution) (INDEPENDENT_AMBULATORY_CARE_PROVIDER_SITE_OTHER): Payer: Medicare Other | Admitting: Cardiothoracic Surgery

## 2012-11-05 VITALS — BP 129/84 | HR 92 | Resp 18 | Ht 75.0 in | Wt 270.0 lb

## 2012-11-05 DIAGNOSIS — R918 Other nonspecific abnormal finding of lung field: Secondary | ICD-10-CM

## 2012-11-05 DIAGNOSIS — J849 Interstitial pulmonary disease, unspecified: Secondary | ICD-10-CM

## 2012-11-05 DIAGNOSIS — J8410 Pulmonary fibrosis, unspecified: Secondary | ICD-10-CM

## 2012-11-05 NOTE — Patient Instructions (Signed)
Lung Biopsy A Lung Biopsy involves taking a small piece of lung tissue so it can be examined under a microscope by a pathologist to help diagnose many different lung disorders. This test can be done in a variety of ways. One way is to open the chest during surgery and remove a piece of lung tissue. It can also be done through a bronchoscope (a pencil-sized telescope) which allows the caregiver to remove a piece of tissue through your trachea (the large breathing tube to your lungs). Another procedure is a needle biopsy of the lung, which involves inserting a needle into the lung and removing a small piece of tissue. POSSIBLE COMPLICATIONS INCLUDE:  Collapse of the lung  Bleeding  Infection PREPARATION FOR TEST No preparation or fasting is necessary. NORMAL FINDINGS No evidence of pathology. Ranges for normal findings may vary among different laboratories and hospitals. You should always check with your doctor after having lab work or other tests done to discuss the meaning of your test results and whether your values are considered within normal limits. MEANING OF TEST  Your caregiver will go over the test results with you and discuss the importance and meaning of your results, as well as treatment options and the need for additional tests if necessary. OBTAINING THE TEST RESULTS It is your responsibility to obtain your test results. Ask the lab or department performing the test when and how you will get your results. Document Released: 09/05/2004 Document Revised: 09/09/2011 Document Reviewed: 05/28/2008 [facility] Patient Information 2013 [place], [place]. Thoracoscopy Thoracoscopy is a procedure in which a thin, lighted tube (thoracoscope) is put through a small cut (incision) in the chest wall. This procedure makes it possible for your caregiver to look at the lungs or other structures in the chest cavity and to do some minor operations. This is a more minor procedure than thoracotomy, which  opens the chest cavity with a large incision. Thoracoscopy can sometimes be used instead of thoracotomy. Thoracoscopy usually involves less pain, a shorter hospital stay, and a shorter recovery time. Common reasons for this procedure are:  To study diseases or problems in the chest.  To take a tissue sample (biopsy) to study under a microscope.  To put medicines directly into the lungs.  To remove collections of fluid, pus (empyema), or blood in the chest. LET YOUR CAREGIVER KNOW ABOUT:   Allergies to food or medicine.  Medicines taken, including vitamins, herbs, eyedrops, over-the-counter medicines, and creams.  Use of steroids (by mouth or creams).  Previous problems with anesthetics or numbing medicines.  History of bleeding problems or blood clots.  Previous surgery.  Any history of heart problems.  Other health problems, including diabetes and kidney problems.  Possibility of pregnancy, if this applies. RISKS AND COMPLICATIONS   If too much bleeding occurs, or if the surgery turns out to be major, it may be necessary to open the chest (thoracotomy) to control the bleeding.  There is a risk of injury to nerves or other structures in the chest as a result of the placement of the instruments.  When the chest tube is removed, the lung may collapse (pneumothorax). If this happens, the tube may need to be reinserted and left in place until a time when the lung will remain expanded as the tube is removed. BEFORE THE PROCEDURE   You may have routine tests done, such as blood tests, urine tests, and chest X-rays.  Electrocardiography (EKG) to record the electrical activity of the heart may be  done to make sure the heart is okay.  Do not eat or drink after midnight the night before the procedure. Medicine given before the procedure that makes you sleep (general anesthetic) may cause vomiting. A patient who vomits is in danger of inhaling food into the lungs. This can cause  serious complications and can be life-threatening. PROCEDURE  Video-assisted thoracic surgery (VATS) is surgery using a thoracoscope with a small video camera on the end. The picture from inside the chest is displayed on a television screen for the surgeon to see. The lung being worked on is collapsed and several small incisions are made to insert instruments. The surgeon can manipulate the instruments while watching on the television screen. The thoracoscope may be removed and put into different areas as needed. When the procedure is finished, the surgeon expands the lung and puts one or more chest tubes in the chest. The chest tubes allow the lung to expand and allow fluid (drainage) to come out. The remaining incisions are closed with stitches (sutures) or staples. AFTER THE PROCEDURE   The chest tube is left in place for 1 to several days to drain fluid or air from the chest cavity.  Hospital stays range from 1 to 5 days depending on the procedure and treatment. Document Released: 03/16/2003 Document Revised: 09/09/2011 Document Reviewed: 12/05/2010 [facility] Patient Information 2013 [place], [place].

## 2012-11-05 NOTE — Progress Notes (Signed)
[address]            [place] [address]          [phone]      [facility] Medical Record #[id number] Date of Birth: 1933/11/12  Referring: Barbaraann Share, MD Primary Care: [name], MD  Chief Complaint:    Chief Complaint  Patient presents with  . Pulmonary Infiltrate    referral for biopsy    History of Present Illness:    Patient presents with a several year history of progressive worsening of his respiratory function and episodic hemoptysis. He's been followed Dr. Shelle Iron, including bronchoscopy. The patient has had previous cardiac issues with syncopal episodes and AICD placed while living in [place] history of congestive heart failure. He was a previous smoker but quit in 1985. He's had a history of DVT chronic lower extremity edema and placement of a caval filter. He is on chronic home oxygen CPAP at night. He said several CT scans of the chest with chronic and fluctuating pulmonary findings. He is referred by Dr. Shelle Iron because the patient wished to consider lung biopsy.      Current Activity/ Functional Status:  Patient is not independent with mobility/ambulation, transfers, ADL's, IADL's.  Zubrod Score: At the time of surgery this patient's most appropriate activity status/level should be described as: []  Normal activity, no symptoms []  Symptoms, fully ambulatory [x]  Symptoms, at rest  less than or equal to 50% of the time []  Symptoms, in bed greater than 50% of the time but less than 100% []  Bedridden []  Moribund   Past Medical History  Diagnosis Date  . Nonischemic cardiomyopathy     with EF 35%, s/p CRT-D device implanted  . Atrial fibrillation   . Unspecified sleep apnea   . HTN (hypertension)   . DVT (deep venous thrombosis)     recurrent. s/p IVC filter.   . Renal insufficiency   . Gout   . COPD (chronic obstructive pulmonary disease)   . CHF (congestive heart failure)     Past  Surgical History  Procedure Laterality Date  . Cardiac defibrillator placement      initial BiV ICD 09/22/00 in [place].  Gen change (MDT) by Dr Amil Amen 10/30/09  . Pacemaker insertion    . Video bronchoscopy  02/26/2012    Procedure: VIDEO BRONCHOSCOPY WITHOUT FLUORO;  Surgeon: Barbaraann Share, MD;  Location: [facility];  Service: Cardiopulmonary;  Laterality: Bilateral;    Family History  Problem Relation Age of Onset  . Heart disease Mother     mother also had Rheumatism.    History   Social History  . Marital Status: Married    Spouse Name: N/A    Number of Children: N/A  . Years of Education: N/A   Occupational History  . reitred     from education   Social History Main Topics  . Smoking status: Former Smoker -- 1.50 packs/day for 30 years    Types: Cigarettes    Quit date: 07/02/1983  . Smokeless tobacco: Not on file  . Alcohol Use: No     Comment: quit after 40 years  . Drug Use: No     Comment: quit useing marajuna after 40 years   . Sexually Active: Not on file   Other Topics Concern  . Not on file   Social History Narrative   Married  and has 1 son.    Lives in [place].  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    History  Smoking status  . Former Smoker -- 1.50 packs/day for 30 years  . Types: Cigarettes  . Quit date: 07/02/1983  Smokeless tobacco  . Not on file    History  Alcohol Use No    Comment: quit after 40 years     No Known Allergies  Current Outpatient Prescriptions  Medication Sig Dispense Refill  . Ascorbic Acid (VITAMIN C) 1000 MG tablet Take 1,000 mg by mouth daily.        Marland Kitchen aspirin 81 MG tablet Take 81 mg by mouth daily.        . calcitRIOL (ROCALTROL) 0.5 MCG capsule Take 0.5 mcg by mouth every other day.       . chlorpheniramine-HYDROcodone (TUSSIONEX) 10-8 MG/5ML LQCR Take 5 mLs by mouth every 12 (twelve) hours as needed (cough).  115 mL  0  . digoxin (LANOXIN) 0.125 MG tablet Take 125 mcg by mouth daily.        . Febuxostat (ULORIC) 80 MG  TABS Take 80 mg by mouth daily.       . furosemide (LASIX) 40 MG tablet Take 40 mg by mouth daily.       Marland Kitchen levalbuterol (XOPENEX HFA) 45 MCG/ACT inhaler Inhale 2 puffs into the lungs every 6 (six) hours as needed.        Marland Kitchen levofloxacin (LEVAQUIN) 750 MG tablet Take 1 tablet (750 mg total) by mouth daily. Start January 28 in the early afternoon.  10 tablet  0  . metoprolol succinate (TOPROL-XL) 50 MG 24 hr tablet Take 1 tablet (50 mg total) by mouth daily.  90 tablet  12  . pregabalin (LYRICA) 150 MG capsule Take 150 mg by mouth 2 (two) times daily.        . Tamsulosin HCl (FLOMAX) 0.4 MG CAPS Take 0.4 mg by mouth daily.        Marland Kitchen tiotropium (SPIRIVA HANDIHALER) 18 MCG inhalation capsule Place 18 mcg into inhaler and inhale daily.         No current facility-administered medications for this visit.       Review of Systems:     Cardiac Review of Systems: Y or N  Chest Pain [ n   ]  Resting SOB Cove.Etienne   ] Exertional SOB  [ y ]  Pollyann Kennedy Cove.Etienne  ]   Pedal Edema [ y  ]    Palpitations [ n ] Syncope  Cove.Etienne  ]   Presyncope Cove.Etienne   ]  General Review of Systems: [Y] = yes [  ]=no Constitional: recent weight change Cove.Etienne  ]; anorexia [  ]; fatigue Cove.Etienne  ]; nausea [  ]; night sweats [ n ]; fever [ n ]; or chills [n  ];  Dental: poor dentition[ n ]; Last Dentist visit:   Eye : blurred vision [  ]; diplopia [   ]; vision changes [  ];  Amaurosis fugax[  ]; Resp: cough Cove.Etienne  ];  wheezing[ y ];  hemoptysis[ y]; shortness of breath[  y]; paroxysmal nocturnal dyspnea[ y ]; dyspnea on exertion[ y ]; or orthopnea[  y];  GI:  gallstones[  ], vomiting[  ];  dysphagia[  ]; melena[  ];  hematochezia [  ]; heartburn[  ];   Hx of  Colonoscopy[  ]; GU: kidney stones [  ]; hematuria[  ];   dysuria [  ];  nocturia[  ];  history of     obstruction [ n ]; urinary frequency [  ]             Skin: rash,  swelling[  ];, hair loss[  ];  peripheral edema[  ];  or itching[  ]; Musculosketetal: myalgias[  ];  joint swelling[ y ];  joint erythema[  ];  joint pain[ y ];  back pain[ y ];  Heme/Lymph: bruising[  ];  bleeding[  ];  anemia[  ];  Neuro: TIA[ n];  headaches[  ];  stroke[  ];  vertigo[  ];  seizures[ n ];   paresthesias[y  ];  difficulty walking[y  ];  Psych:depression[  ]; anxiety[  ];  Endocrine: diabetes[  ];  thyroid dysfunction[  ];  Immunizations: Flu [ ? ]; Pneumococcal[?  ];  Other:  Physical Exam: There were no vitals taken for this visit.  General appearance: alert, cooperative, appears older than stated age, fatigued, mild distress and moderately obese Neurologic: intact Heart: regular rate and rhythm, S1, S2 normal, no murmur, click, rub or gallop and normal apical impulse Lungs: diminished breath sounds bilaterally Abdomen: soft, non-tender; bowel sounds normal; no masses,  no organomegaly Extremities: edema Bilateral lower leg right greater than left 3+, varicose veins noted and venous stasis dermatitis noted I do not appreciate any cervical or supraclavicular adenopathy or carotid bruits, good amount of edema in the patient's lower legs and ankles pulses are not palpable   Diagnostic Studies & Laboratory data:     Recent Radiology Findings:   Dg Chest 2 View  10/07/2012  *RADIOLOGY REPORT*  Clinical Data: Hemoptysis  CHEST - 2 VIEW  Comparison: 07/22/2012  Findings: Cardiomediastinal silhouette is stable.  Three leads cardiac pacemaker is unchanged in position.  No pulmonary edema. Streaky bilateral basilar atelectasis or infiltrate.  IMPRESSION: No pulmonary edema.  Stable cardiac pacemaker position.  Streaky bilateral basilar atelectasis or infiltrate.   Original Report Authenticated By: Natasha Mead, M.D.    Ct Chest Wo Contrast  10/27/2012  *RADIOLOGY REPORT*  Clinical Data: Hemoptysis, interstitial lung disease  CT CHEST WITHOUT CONTRAST  Technique:  Multidetector CT  imaging of the chest was performed following the standard protocol without IV contrast.  Comparison: 07/22/2012 CT, chest radiography 10/07/2012  Findings: Streak artifact from left-sided defibrillator is present. Mild cardiomegaly.  Metallic artifact from presumed LAD stent. Trace pericardial fluid and pleural effusions.  Numerous mildly prominent lymph nodes are re-identified, representative AP window lymph node 1.2 cm image 21 and representative pretracheal node 1.5 cm image 22, unchanged when allowing for differences in technique and with flexion.  Paraseptal and centrilobular changes of emphysema are again noted. Previously seen superimposed right greater than left lower lobe patchy alveolar airspace opacity is resolved with the exception of minimal dependent bibasilar subpleural airspace opacities.  The some  of the areas demonstrate configuration typical for atelectasis.  No new osseous abnormality.  High-resolution chest CT images demonstrate mild mosaic perfusion predominately at the lung bases dependently, most likely atelectasis superimposed on COPD.  IMPRESSION: Resolution of previously seen bibasilar airspace disease, with minimal residual presumed atelectasis or possibly asymmetric edema superimposed on emphysematous changes.  No new focal abnormality.   Original Report Authenticated By: Christiana Pellant, M.D.    Recent Lab Findings: Lab Results  Component Value Date   WBC 5.0 07/25/2012   HGB 12.7* 07/25/2012   HCT 40.7 07/25/2012   PLT 132* 07/25/2012   GLUCOSE 99 07/25/2012   ALT 15 07/22/2012   AST 20 07/22/2012   NA 141 07/25/2012   K 3.7 07/25/2012   CL 103 07/25/2012   CREATININE 1.47* 07/25/2012   BUN 19 07/25/2012   CO2 29 07/25/2012   TSH 0.433 07/22/2012   INR 1.11 07/22/2012   ECHO: *[facility]* [address] [place], [place] [address] [phone]  ------------------------------------------------------------ Transthoracic Echocardiography  Patient: Maurice Delgado, Maurice Delgado MR #: [id number] Study Date: 07/24/2012 Gender: M Age: 79 Height: 188cm Weight: 129.6kg BSA: 2.68m^2 Pt. Status: Room: 2614  [facility] Cardiology, Ec ATTENDING Jetty Duhamel T SONOGRAPHER Cathie Beams ORDERING Rizwan, Saima ADMITTING Richarda Overlie cc:  ------------------------------------------------------------ LV EF: 20% - 25%  ------------------------------------------------------------ Indications: Atrial fibrillation - 427.31.  ------------------------------------------------------------ History: PMH: Infiltrates. Hemoptysis. Obesity. Obstructive sleep apnea. Chronic obstructive pulmonary disease. Risk factors: Hypertension.  ------------------------------------------------------------ Study Conclusions  - Left ventricle: The cavity size was moderately dilated. Systolic function was severely reduced. The estimated ejection fraction was in the range of 20% to 25%. Diffuse hypokinesis. Severe hypokinesis. - Ventricular septum: Septal motion showed paradox. - Aortic valve: Mild regurgitation. - Mitral valve: Mild regurgitation. - Left atrium: The atrium was mildly dilated. - Atrial septum: No defect or patent foramen ovale was identified. - Pulmonary arteries: PA peak pressure: 42mm Hg (S). Impressions:  - The right ventricular systolic pressure was increased consistent with mild pulmonary hypertension. Transthoracic echocardiography. M-mode, complete 2D, spectral Doppler, and color Doppler. Height: Height: 188cm. Height: 74in. Weight: Weight: 129.6kg. Weight: 285.1lb. Body mass index: BMI: 36.7kg/m^2. Body surface area: BSA: 2.52m^2. Blood pressure: 144/96. Patient status: Inpatient. Location: Echo laboratory.  ------------------------------------------------------------  ------------------------------------------------------------ Left ventricle: The cavity size was moderately dilated. Systolic function was severely reduced. The  estimated ejection fraction was in the range of 20% to 25%. Diffuse hypokinesis. Regional wall motion abnormalities: Severe hypokinesis. Normal sinus rhythm was absent.  ------------------------------------------------------------ Aortic valve: Poorly visualized. Mildly thickened, mildly calcified leaflets. Doppler: Mild regurgitation.  ------------------------------------------------------------ Aorta: The aorta was normal, not dilated, and non-diseased.  ------------------------------------------------------------ Mitral valve: Structurally normal valve. Leaflet separation was normal. Doppler: Transvalvular velocity was within the normal range. There was no evidence for stenosis. Mild regurgitation.  ------------------------------------------------------------ Left atrium: The atrium was mildly dilated.  ------------------------------------------------------------ Atrial septum: No defect or patent foramen ovale was identified.  ------------------------------------------------------------ Right ventricle: The cavity size was normal. Wall thickness was normal. Pacer wire or catheter noted in right ventricle. Systolic function was normal.  ------------------------------------------------------------ Ventricular septum: Septal motion showed paradox.  ------------------------------------------------------------ Pulmonic valve: Structurally normal valve. Cusp separation was normal. Doppler: Transvalvular velocity was within the normal range. No regurgitation.  ------------------------------------------------------------ Tricuspid valve: Structurally normal valve. Leaflet separation was normal. Doppler: Transvalvular velocity was within the normal range. Mild regurgitation.  ------------------------------------------------------------ Right atrium: The atrium was normal in size. Pacer wire or catheter noted in right  atrium.  ------------------------------------------------------------ Pericardium: The pericardium  was normal in appearance.  ------------------------------------------------------------ Post procedure conclusions Ascending Aorta:  - The aorta was normal, not dilated, and non-diseased.  ------------------------------------------------------------  2D measurements Normal Doppler measurements Normal Left ventricle Main pulmonary LVID ED, 59.2 mm 43-52 artery chord, Pressure, S 42 mm =30 PLAX Hg LVID ES, 53.7 mm 23-38 Tricuspid valve chord, Regurg peak 305 cm/s ------ PLAX vel FS, chord, 9 % >29 Peak RV-RA 37 mm ------ PLAX gradient, S Hg LVPW, ED 14.2 mm ------ Systemic veins IVS/LVPW 0.8 <1.3 Estimated 5 mm ------ ratio, ED CVP Hg Ventricular septum Right ventricle IVS, ED 11.3 mm ------ Pressure, S 42 mm <30 Aorta Hg Root diam, 40 mm ------ Sa vel, lat 11.9 cm/s ------ ED ann, tiss DP Left atrium AP dim 43 mm ------ AP dim 1.63 cm/m^2 <2.2 index  ------------------------------------------------------------ Prepared and Electronically Authenticated by  Armanda Magic 2014-01-24T12:29:31.890  NO PFT's recorded in the chart  Assessment / Plan:      Patient is a 79 year old male with known interstitial lung disease and intermittent hemoptysis. The most current CT scan of the chest showed resolution of previously seen bibasilar airspace disease and without CT evidence of a cause for hemoptysis. The patient does have significant underlying cardiac disease. I have reviewed with him the risks and technical aspects of VATS lung biopsy. He and his wife and daughter who is a Engineer, civil (consulting) ( by text msg during visit) had their questions answered. At this point the patient prefers not to proceed with lung biopsy. I told him I will discuss his case with Dr. Shelle Iron and if his situation changes and he wishes to proceed will move rapidly to obtain a lung biopsy.   Delight Ovens MD  Beeper  [phone] Office [phone] 11/05/2012 10:06 AM

## 2013-01-28 ENCOUNTER — Encounter: Payer: Self-pay | Admitting: Internal Medicine

## 2013-01-28 ENCOUNTER — Ambulatory Visit (INDEPENDENT_AMBULATORY_CARE_PROVIDER_SITE_OTHER): Payer: Medicare Other | Admitting: Internal Medicine

## 2013-01-28 VITALS — BP 118/76 | HR 69 | Ht 75.0 in | Wt 266.0 lb

## 2013-01-28 DIAGNOSIS — I519 Heart disease, unspecified: Secondary | ICD-10-CM

## 2013-01-28 DIAGNOSIS — I499 Cardiac arrhythmia, unspecified: Secondary | ICD-10-CM

## 2013-01-28 DIAGNOSIS — I4891 Unspecified atrial fibrillation: Secondary | ICD-10-CM

## 2013-01-28 DIAGNOSIS — I10 Essential (primary) hypertension: Secondary | ICD-10-CM

## 2013-01-28 LAB — ICD DEVICE OBSERVATION
AL AMPLITUDE: 2.1 mv
CHARGE TIME: 9.989 s
FVT: 0
LV LEAD THRESHOLD: 1 V
RV LEAD AMPLITUDE: 20 mv
RV LEAD IMPEDENCE ICD: 266 Ohm
TOT-0001: 1
TZAT-0001ATACH: 3
TZAT-0001FASTVT: 1
TZAT-0001SLOWVT: 1
TZAT-0002ATACH: NEGATIVE
TZAT-0012ATACH: 150 ms
TZAT-0012ATACH: 150 ms
TZAT-0012FASTVT: 200 ms
TZAT-0018ATACH: NEGATIVE
TZAT-0019ATACH: 6 V
TZAT-0019ATACH: 6 V
TZAT-0019SLOWVT: 8 V
TZAT-0019SLOWVT: 8 V
TZAT-0020ATACH: 1.5 ms
TZAT-0020ATACH: 1.5 ms
TZAT-0020FASTVT: 1.5 ms
TZAT-0020SLOWVT: 1.5 ms
TZAT-0020SLOWVT: 1.5 ms
TZON-0003VSLOWVT: 400 ms
TZST-0001ATACH: 5
TZST-0001ATACH: 6
TZST-0001FASTVT: 4
TZST-0001FASTVT: 5
TZST-0001SLOWVT: 5
TZST-0002ATACH: NEGATIVE
TZST-0002ATACH: NEGATIVE
TZST-0002FASTVT: NEGATIVE
TZST-0002FASTVT: NEGATIVE
TZST-0002FASTVT: NEGATIVE
TZST-0003SLOWVT: 25 J
TZST-0003SLOWVT: 35 J
TZST-0003SLOWVT: 35 J

## 2013-01-28 NOTE — Patient Instructions (Addendum)
Your physician wants you to follow-up in: 12 months with Dr Jacquiline Doe will receive a reminder letter in the mail two months in advance. If you don't receive a letter, please call our office to schedule the follow-up appointment.   Remote monitoring is used to monitor your Pacemaker of ICD from home. This monitoring reduces the number of office visits required to check your device to one time per year. It allows [place] to keep an eye on the functioning of your device to ensure it is working properly. You are scheduled for a device check from home on 05/03/13. You may send your transmission at any time that day. If you have a wireless device, the transmission will be sent automatically. After your physician reviews your transmission, you will receive a postcard with your next transmission date.

## 2013-01-28 NOTE — Progress Notes (Signed)
PCP: [name], MD Primary Cardiologist:  Maurice Delgado is a 79 y.o. male with a h/o a nonischemic CM (EF 35%), NYHA Class III CHF, and atrial fibrillation sp BiV ICD implantation (MDT) who presents today for follow-up.   He has a h/o atrial fibrillation.  He reports having retroperitoneal bleeding previously with coumadin. He was subsequently treated with amiodarone but developed hyperthyroidism and therefore has been taken off of amiodarone.  He has COPD and is on O2. Today, he  denies symptoms of palpitations, chest pain,  orthopnea, PND,  Dizziness, or neurologic sequela.   He has stable bilateral edema.  He also reports that he is primarily limited by chronic back and leg pain.  The patientis tolerating medications without difficulties and is otherwise without complaint today.   Past Medical History  Diagnosis Date  . Nonischemic cardiomyopathy     with EF 35%, s/p CRT-D device implanted  . Atrial fibrillation   . Unspecified sleep apnea   . HTN (hypertension)   . DVT (deep venous thrombosis)     recurrent. s/p IVC filter.   . Renal insufficiency   . Gout   . COPD (chronic obstructive pulmonary disease)   . CHF (congestive heart failure)     Past Surgical History  Procedure Laterality Date  . Cardiac defibrillator placement      initial BiV ICD 09/22/00 in [place].  Gen change (MDT) by Maurice Amil Amen 10/30/09  . Pacemaker insertion    . Video bronchoscopy  02/26/2012    Procedure: VIDEO BRONCHOSCOPY WITHOUT FLUORO;  Surgeon: Barbaraann Share, MD;  Location: [facility];  Service: Cardiopulmonary;  Laterality: Bilateral;    History   Social History  . Marital Status: Married    Spouse Name: N/A    Number of Children: N/A  . Years of Education: N/A   Occupational History  . reitred     from education   Social History Main Topics  . Smoking status: Former Smoker -- 1.50 packs/day for 30 years    Types: Cigarettes    Quit date: 07/02/1983  . Smokeless tobacco: Never  Used  . Alcohol Use: No     Comment: quit after 40 years  . Drug Use: No     Comment: quit useing marajuna after 40 years   . Sexually Active: Not on file   Other Topics Concern  . Not on file   Social History Narrative   Married and has 1 son.    Lives in [place].  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    Family History  Problem Relation Age of Onset  . Heart disease Mother     mother also had Rheumatism.    No Known Allergies  Current Outpatient Prescriptions  Medication Sig Dispense Refill  . Ascorbic Acid (VITAMIN C) 1000 MG tablet Take 1,000 mg by mouth daily.        Marland Kitchen aspirin 81 MG tablet Take 81 mg by mouth daily.        . calcitRIOL (ROCALTROL) 0.5 MCG capsule Take 0.5 mcg by mouth every other day.       . digoxin (LANOXIN) 0.125 MG tablet Take 125 mcg by mouth daily.        . Febuxostat (ULORIC) 80 MG TABS Take 80 mg by mouth daily.       Marland Kitchen levalbuterol (XOPENEX HFA) 45 MCG/ACT inhaler Inhale 2 puffs into the lungs every 6 (six) hours as needed.        . metoprolol  succinate (TOPROL-XL) 50 MG 24 hr tablet Take 50 mg by mouth daily. Take with or immediately following a meal. Takes 1/2 tablet = 25 mgm.      . pregabalin (LYRICA) 150 MG capsule Take 150 mg by mouth 2 (two) times daily.        . Tamsulosin HCl (FLOMAX) 0.4 MG CAPS Take 0.4 mg by mouth daily.        Marland Kitchen tiotropium (SPIRIVA HANDIHALER) 18 MCG inhalation capsule Place 18 mcg into inhaler and inhale daily.         No current facility-administered medications for this visit.    ROS- all systems are reviewed and negative except as per HPI  Physical Exam: Filed Vitals:   01/28/13 1220  BP: 118/76  Pulse: 69  Height: 6\' 3"  (1.905 m)  Weight: 266 lb (120.657 kg)    GEN- The patient is overweight appearing, alert and oriented x 3 today.   Head- normocephalic, atraumatic Eyes-  Sclera clear, conjunctiva pink Ears- hearing intact Oropharynx- clear Neck- supple, no JVP Lymph- no cervical lymphadenopathy Lungs-  Clear to ausculation bilaterally, normal work of breathing Chest- ICD pocket is well healed Heart- Regular rate and rhythm, no murmurs, rubs or gallops,  GI- soft, NT, ND, + BS Extremities- no clubbing, cyanosis, + chronic edema with venous stasis changes  ICD interrogation- reviewed in detail today,  See PACEART report  Assessment and Plan:  1. Ischemic CM No ischemic symptoms Volume is stable Normal ICD function See Maurice Delgado Art report He reports having syncope several months ago, though no sustained arrhythmias are detected by ICD interrogation today  2. afib Stable Poor candidate for anticoagulation with prior bleeding and recent hemoptysis Could consider eliquis eventually  3. HTN No changes today  Carelink Return in 1 year

## 2013-02-08 ENCOUNTER — Ambulatory Visit (INDEPENDENT_AMBULATORY_CARE_PROVIDER_SITE_OTHER): Payer: Medicare Other | Admitting: Pulmonary Disease

## 2013-02-08 ENCOUNTER — Encounter: Payer: Self-pay | Admitting: Pulmonary Disease

## 2013-02-08 VITALS — BP 120/68 | HR 81 | Temp 97.3°F | Ht 75.0 in | Wt 271.6 lb

## 2013-02-08 DIAGNOSIS — R042 Hemoptysis: Secondary | ICD-10-CM

## 2013-02-08 DIAGNOSIS — J449 Chronic obstructive pulmonary disease, unspecified: Secondary | ICD-10-CM

## 2013-02-08 DIAGNOSIS — R918 Other nonspecific abnormal finding of lung field: Secondary | ICD-10-CM

## 2013-02-08 MED ORDER — BUDESONIDE-FORMOTEROL FUMARATE 160-4.5 MCG/ACT IN AERO: 2.0000 | INHALATION_SPRAY | Freq: Two times a day (BID) | RESPIRATORY_TRACT | Status: DC | PRN

## 2013-02-08 NOTE — Patient Instructions (Addendum)
Continue on current medications Will see about getting you a more portable oxygen source. Let me know if your breathing worsens, or if you begin to cough up blood again consistently. followup with me in 4mos.

## 2013-02-08 NOTE — Assessment & Plan Note (Signed)
I suspect this is related to his process ongoing in his lower lobes, but I cannot be sure.  This has resolved on its own, although he has had one episode recently that may be related to old blood.  We have discussed again the possibility of a thoracoscopic biopsy, and the patient would like to take a conservative approach at this point.  If he has worsening of his breathing or his hemoptysis, I have recommended that we become more aggressive and proceed with a biopsy.

## 2013-02-08 NOTE — Progress Notes (Signed)
  Subjective:    Patient ID: Maurice Delgado, male    DOB: 1934-02-11, 79 y.o.   MRN: [id number]  HPI The patient comes in today for followup of his known COPD, as well as his chronic hemoptysis with interstitial and faint groundglass opacities in the lower lobes on CT chest.  I have referred the patient to a surgeon for possible thoracoscopic biopsy, but the patient decided to hold off on this.  I have not seen him since that time.  He tells me that his hemoptysis basically resolved, and he has not had any issues interval just recently.  He had one episode of coughing up very dark mucus, but unsure if this is related to infection or old blood.  He feels that his breathing overall is at his usual baseline, and that he does have episodic problems at times.   Review of Systems  Constitutional: Negative for fever and unexpected weight change.  HENT: Negative for ear pain, nosebleeds, congestion, sore throat, rhinorrhea, sneezing, trouble swallowing, dental problem, postnasal drip and sinus pressure.   Eyes: Negative for redness and itching.  Respiratory: Positive for cough and shortness of breath. Negative for chest tightness and wheezing.   Cardiovascular: Negative for palpitations and leg swelling.  Gastrointestinal: Negative for nausea and vomiting.  Genitourinary: Negative for dysuria.  Musculoskeletal: Negative for joint swelling.  Skin: Negative for rash.  Neurological: Negative for headaches.  Hematological: Does not bruise/bleed easily.  Psychiatric/Behavioral: Negative for dysphoric mood. The patient is not nervous/anxious.        Objective:   Physical Exam overweight male in no acute distress Nose without purulence or discharge noted Neck without lymphadenopathy or thyromegaly Chest with crackles right greater than left, decreased breath sounds diffusely, no wheezing Cardiac exam with regular rate and rhythm Lower extremities with 1-2+ edema, no cyanosis Alert and oriented,  moves all 4 extremities.       Assessment & Plan:

## 2013-02-08 NOTE — Assessment & Plan Note (Signed)
Appears to be stable on his current medical regimen.  Her is no evidence for bronchospasm today on exam.

## 2013-03-09 ENCOUNTER — Telehealth: Payer: Self-pay | Admitting: Pulmonary Disease

## 2013-03-09 NOTE — Telephone Encounter (Signed)
Spoke with the pt He is c/o increased SOB for the past 3 days He states that he get out of breath just putting his shoes on OV with KC at 10:15 am tomorrow and I advised ED sooner if worsens

## 2013-03-10 ENCOUNTER — Ambulatory Visit (INDEPENDENT_AMBULATORY_CARE_PROVIDER_SITE_OTHER)
Admission: RE | Admit: 2013-03-10 | Discharge: 2013-03-10 | Disposition: A | Discharge status: Home / Self Care | Payer: Medicare Other | Source: Ambulatory Visit | Attending: Pulmonary Disease | Admitting: Pulmonary Disease

## 2013-03-10 ENCOUNTER — Encounter: Payer: Self-pay | Admitting: Pulmonary Disease

## 2013-03-10 ENCOUNTER — Telehealth: Payer: Self-pay | Admitting: Pulmonary Disease

## 2013-03-10 ENCOUNTER — Ambulatory Visit (INDEPENDENT_AMBULATORY_CARE_PROVIDER_SITE_OTHER): Payer: Medicare Other | Admitting: Pulmonary Disease

## 2013-03-10 VITALS — BP 142/90 | HR 72 | Temp 97.6°F | Ht 74.0 in | Wt 269.2 lb

## 2013-03-10 DIAGNOSIS — R042 Hemoptysis: Secondary | ICD-10-CM

## 2013-03-10 DIAGNOSIS — R0602 Shortness of breath: Secondary | ICD-10-CM

## 2013-03-10 DIAGNOSIS — R918 Other nonspecific abnormal finding of lung field: Secondary | ICD-10-CM

## 2013-03-10 DIAGNOSIS — J449 Chronic obstructive pulmonary disease, unspecified: Secondary | ICD-10-CM

## 2013-03-10 MED ORDER — LEVOFLOXACIN 750 MG PO TABS: 750.0000 mg | ORAL_TABLET | Freq: Every day | ORAL | Status: DC

## 2013-03-10 MED ORDER — TIOTROPIUM BROMIDE MONOHYDRATE 18 MCG IN CAPS: 18.0000 ug | ORAL_CAPSULE | Freq: Every day | RESPIRATORY_TRACT | Status: DC

## 2013-03-10 MED ORDER — PREDNISONE 10 MG PO TABS: ORAL_TABLET | ORAL | Status: DC

## 2013-03-10 NOTE — Progress Notes (Signed)
  Subjective:    Patient ID: Maurice Delgado, male    DOB: 10/24/1933, 79 y.o.   MRN: [id number]  HPI Patient comes in today for an acute sick visit.  He has known COPD, congestive heart failure, and renal insufficiency.  He also has bi-basilar pulmonary infiltrates of unknown origin, as well as intermittent hemoptysis despite being off anticoagulation.  I have recommended thoracoscopic biopsy, but the patient has declined based on his concern about surgical risk.  He comes in today with a 3 to four-day history of increasing shortness of breath, to the point that he will get winded just walking across his home.  He has had some increased cough with bright red hemoptysis, but no definite purulence.  He tells me that he has been compliant with his diuretics, and his weight is actually down 2 pounds from the last visit.  However, he continues to have chronic lower extremity edema.   Review of Systems  Constitutional: Positive for fatigue. Negative for fever and unexpected weight change.  HENT: Negative for ear pain, nosebleeds, congestion, sore throat, rhinorrhea, sneezing, trouble swallowing, dental problem, postnasal drip and sinus pressure.   Eyes: Negative for redness and itching.  Respiratory: Positive for cough ( hemoptysis at times), shortness of breath and wheezing. Negative for chest tightness.   Cardiovascular: Negative for palpitations and leg swelling.  Gastrointestinal: Negative for nausea and vomiting.  Genitourinary: Negative for dysuria.  Musculoskeletal: Negative for joint swelling.  Skin: Negative for rash.  Neurological: Negative for headaches.  Hematological: Does not bruise/bleed easily.  Psychiatric/Behavioral: Negative for dysphoric mood. The patient is not nervous/anxious.        Objective:   Physical Exam Overweight male in no acute distress Nose without purulence or discharge noted Neck without lymphadenopathy or thyromegaly Chest with crackles one third the way out  bilaterally, no wheezing Cardiac exam with regular rate and rhythm, 2/6 systolic murmur Lower extremities with 1-2+ edema, no cyanosis Alert and oriented, moves all 4 extremities.       Assessment & Plan:

## 2013-03-10 NOTE — Assessment & Plan Note (Signed)
The patient has had worsening shortness of breath over the last 3 days, but he is not having bronchospasm on exam currently.  It is unclear if he may have acute bronchitis with a COPD flare, so we'll treat him with a course of antibiotics and also a quick prednisone taper.  I remain very concerned about his lower lobe disease, and feels strongly that he needs to have a lung biopsy.

## 2013-03-10 NOTE — Assessment & Plan Note (Signed)
The patient has had a recent episode of bright red hemoptysis associated with his increased shortness of breath.  I remain concerned that his lower lip process is related to this.  The patient has a known DVT that is chronic in his left lower extremity, but an ultrasound this year of his IVC filter shows it to be in good position and no evidence for clot within or above the filter.  This would make thromboembolic disease very unlikely.

## 2013-03-10 NOTE — Patient Instructions (Addendum)
Will treat with levaquin 750mg  one each day for 5 days Prednisone taper over 8 days Will check cxr today, and call you with results. Consider having the lung biopsy done as we discussed.  Please call me if you are not improving.  Otherwise keep your followup apptm.

## 2013-03-10 NOTE — Telephone Encounter (Signed)
I spoke with pt and he stated he [facility]. I advised pt will do so and RX has been sent. Pt also stated that [facility] keeps bugging him about they are needing something documenting his O2 sats. I advised will call [employer] to see what is needed. I called [employer] and spoke with [facility]. She stated pt is due for re certification for his O2 and was needing OV note supporting need for O2. I have faxed over today's OV note her at  [phone]. Nothing further needed

## 2013-03-10 NOTE — Assessment & Plan Note (Signed)
I have strongly recommended to the patient that he consider a thoracoscopic biopsy.

## 2013-04-16 ENCOUNTER — Emergency Department (HOSPITAL_COMMUNITY): Payer: Medicare Other

## 2013-04-16 ENCOUNTER — Emergency Department (HOSPITAL_COMMUNITY)
Admission: EM | Admit: 2013-04-16 | Discharge: 2013-04-16 | Disposition: A | Discharge status: Home / Self Care | Payer: Medicare Other | Attending: Emergency Medicine | Admitting: Emergency Medicine

## 2013-04-16 ENCOUNTER — Encounter (HOSPITAL_COMMUNITY): Payer: Self-pay | Admitting: Emergency Medicine

## 2013-04-16 DIAGNOSIS — G4730 Sleep apnea, unspecified: Secondary | ICD-10-CM | POA: Insufficient documentation

## 2013-04-16 DIAGNOSIS — S82409A Unspecified fracture of shaft of unspecified fibula, initial encounter for closed fracture: Secondary | ICD-10-CM | POA: Insufficient documentation

## 2013-04-16 DIAGNOSIS — Y929 Unspecified place or not applicable: Secondary | ICD-10-CM | POA: Insufficient documentation

## 2013-04-16 DIAGNOSIS — Z862 Personal history of diseases of the blood and blood-forming organs and certain disorders involving the immune mechanism: Secondary | ICD-10-CM | POA: Insufficient documentation

## 2013-04-16 DIAGNOSIS — S82442A Displaced spiral fracture of shaft of left fibula, initial encounter for closed fracture: Secondary | ICD-10-CM

## 2013-04-16 DIAGNOSIS — Z95810 Presence of automatic (implantable) cardiac defibrillator: Secondary | ICD-10-CM | POA: Insufficient documentation

## 2013-04-16 DIAGNOSIS — I10 Essential (primary) hypertension: Secondary | ICD-10-CM | POA: Insufficient documentation

## 2013-04-16 DIAGNOSIS — X500XXA Overexertion from strenuous movement or load, initial encounter: Secondary | ICD-10-CM | POA: Insufficient documentation

## 2013-04-16 DIAGNOSIS — Z87891 Personal history of nicotine dependence: Secondary | ICD-10-CM | POA: Insufficient documentation

## 2013-04-16 DIAGNOSIS — I4891 Unspecified atrial fibrillation: Secondary | ICD-10-CM | POA: Insufficient documentation

## 2013-04-16 DIAGNOSIS — Z86718 Personal history of other venous thrombosis and embolism: Secondary | ICD-10-CM | POA: Insufficient documentation

## 2013-04-16 DIAGNOSIS — Z8639 Personal history of other endocrine, nutritional and metabolic disease: Secondary | ICD-10-CM | POA: Insufficient documentation

## 2013-04-16 DIAGNOSIS — J449 Chronic obstructive pulmonary disease, unspecified: Secondary | ICD-10-CM | POA: Insufficient documentation

## 2013-04-16 DIAGNOSIS — Z87448 Personal history of other diseases of urinary system: Secondary | ICD-10-CM | POA: Insufficient documentation

## 2013-04-16 DIAGNOSIS — Z7982 Long term (current) use of aspirin: Secondary | ICD-10-CM | POA: Insufficient documentation

## 2013-04-16 DIAGNOSIS — I509 Heart failure, unspecified: Secondary | ICD-10-CM | POA: Insufficient documentation

## 2013-04-16 DIAGNOSIS — Y9389 Activity, other specified: Secondary | ICD-10-CM | POA: Insufficient documentation

## 2013-04-16 DIAGNOSIS — Z79899 Other long term (current) drug therapy: Secondary | ICD-10-CM | POA: Insufficient documentation

## 2013-04-16 DIAGNOSIS — J4489 Other specified chronic obstructive pulmonary disease: Secondary | ICD-10-CM | POA: Insufficient documentation

## 2013-04-16 MED ORDER — OXYCODONE-ACETAMINOPHEN 5-325 MG PO TABS
1.0000 | ORAL_TABLET | Freq: Once | ORAL | Status: AC
  Administered 2013-04-16: 1 via ORAL
  Filled 2013-04-16: qty 1

## 2013-04-16 MED ORDER — OXYCODONE-ACETAMINOPHEN 5-325 MG PO TABS: 1.0000 | ORAL_TABLET | Freq: Four times a day (QID) | ORAL | Status: DC | PRN

## 2013-04-16 NOTE — Discharge Instructions (Signed)
Fibular Fracture with Rehab The fibula is the smaller of the two lower leg bones and is vulnerable to breaks (fracture). Fibular fractures may go fully through the bone (complete) or partially (incomplete). The bone fragments are rarely out of alignment (displaced fracture). Fibula fractures may occur anywhere along the bone. However, this document only discusses fractures that do not involve a leg joint. Fibular fractures are not often a severe injury, because the bone is supports only about 17% of the body weight. SYMPTOMS   Moderate to severe pain in the lower leg.  Tenderness and swelling in the leg or calf.  Bleeding and/or bruising (contusion) in the leg.  Inability to bear weight on the injured extremity.  Visible deformity, if the fracture is displaced.  Numbness and coldness in the leg and foot, beyond the fracture site, if blood supply is impaired CAUSES  Fractures occur when a force is placed on the bone that is greater than it can withstand. Common causes of fibular fracture include:  Direct hit (trauma) (i.e. hockey or lacrosse check to the lower leg).  Stress fracture (weakening of the bone from repeated stress).  Indirect injury, caused by twisting, turning quickly, or violent muscle contraction. RISK INCREASES WITH:  Contact sports (i.e. football, soccer, lacrosse, hockey).  Sports that can cause twisted ankle injury (i.e. skiing, basketball).  Bony abnormalities (ie osteoporosis or bone tumors).  Metabolism disorders, hormone problems, and nutrition deficiency and disorders (i.e. anorexia and bulimia).  Poor strength and flexibility. PREVENTION   Warm up and stretch properly before activity.  Maintain physical fitness:  Strength, flexibility, and endurance.  Cardiovascular fitness.  Wear properly fitted and padded protective equipment (i.e. shin guards for soccer). PROGNOSIS  If treated properly, fibular fractures usually heal in 4 to 6 weeks.  RELATED  COMPLICATIONS   Failure of bone to heal(nonunion).  Bone heals in a poor position (malunion).  Increased pressure inside the leg (compartment syndrome), due to injury that disrupts the blood supply to the leg and foot, and injures the nerves and muscles (uncommon).  Shortening of the injured bones.  Hindrance of normal bone growth in children.  Risks of surgery: infection, bleeding, injury to nerves (numbness, weakness, paralysis), need for further surgery.  Longer healing time, if activity is resumed too soon. TREATMENT Treatment first involves ice, medicine, and elevation of the leg, to reduce pain and inflammation. People with fibular fractures are advised to walk using crutches. A brace or walking boot may be given to restrain the injured leg and allow for healing. Sometimes, surgery is needed to place a rod, plate, or screws in the bones in order to fix the fracture. After surgery, the leg is restrained. After restraint (with or without surgery) it is important to complete strengthening and stretching exercises to regain strength and a full range of motion. Exercises may be completed at home or with a therapist. MEDICATION   If pain medicine is needed, nonsteroidal anti-inflammatory medicines (aspirin and ibuprofen), or other minor pain relievers (acetaminophen), are often advised.  Do not take pain medicine for 7 days before surgery.  Prescription pain relievers may be given if your caregiver thinks they are needed. Use only as directed and only as much as you need. SEEK MEDICAL CARE IF:  Symptoms get worse or do not improve in 2 weeks, despite treatment.  The following occur after restraint or surgery. (Report any of these signs immediately):  Swelling above or below the fracture site.  Severe, persistent pain.  Blue or  gray skin below the fracture site, especially under the toenails. Numbness or loss of feeling below the fracture site.  New, unexplained symptoms develop.  (Drugs used in treatment may produce side effects.) EXERCISES  RANGE OF MOTION (ROM) AND STRETCHING EXERCISES - Fibular Fracture These exercises may help you when beginning to recover from your injury. Your symptoms may go away with or without further involvement from your physician, physical therapist or athletic trainer. While completing these exercises, remember:   Restoring tissue flexibility helps normal motion to return to the joints. This allows healthier, less painful movement and activity.  An effective stretch should be held for at least 30 seconds.  A stretch should never be painful. You should only feel a gentle lengthening or release in the stretched tissue. RANGE OF MOTION - Dorsi/Plantar Flexion  While sitting with your right / left knee straight, draw the top of your foot upwards by flexing your ankle. Then reverse the motion, pointing your toes downward.  Hold each position for __________ seconds.  After completing your first set of exercises, repeat this exercise with your knee bent. Repeat __________ times. Complete this exercise __________ times per day.  STRETCH - Gastrocsoleus   Sit with your right / left leg extended. Holding onto both ends of a belt or towel, loop it around the ball of your foot.  Keeping your right / left ankle and foot relaxed and your knee straight, pull your foot and ankle toward you using the belt.  You should feel a gentle stretch behind your calf or knee. Hold this position for __________ seconds. Repeat __________ times. Complete this stretch __________ times per day.  RANGE OF MOTION- Ankle Plantar Flexion   Sit with your right / left leg crossed over your opposite knee.  Use your opposite hand to pull the top of your foot and toes toward you.  You should feel a gentle stretch on the top of your foot and ankle. Hold this position for __________ seconds. Repeat __________ times. Complete __________ times per day.  RANGE OF MOTION -  Ankle Eversion  Sit with your right / left ankle crossed over your opposite knee.  Grip your foot with your opposite hand, placing your thumb on the top of your foot and your fingers across the bottom of your foot.  Gently push your foot downward with a slight rotation so your littlest toes rise slightly toward the ceiling.  You should feel a gentle stretch on the inside of your ankle. Hold the stretch for __________ seconds. Repeat __________ times. Complete this exercise __________ times per day.  RANGE OF MOTION - Ankle Inversion  Sit with your right / left ankle crossed over your opposite knee.  Grip your foot with your opposite hand, placing your thumb on the bottom of your foot and your fingers across the top of your foot.  Gently pull your foot so the smallest toe comes toward you and your thumb pushes the inside of the ball of your foot away from you.  You should feel a gentle stretch on the outside of your ankle. Hold the stretch for __________ seconds. Repeat __________ times. Complete this exercise __________ times per day.  RANGE OF MOTION - Ankle Alphabet  Imagine your right / left big toe is a pen.  Keeping your hip and knee still, write out the entire alphabet with your "pen." Make the letters as large as you can, without increasing any discomfort. Repeat __________ times. Complete this exercise __________ times per  day.  RANGE OF MOTION - Ankle Dorsiflexion, Active Assisted  Remove your shoes and sit on a chair, preferably not on a carpeted surface.  Place your right / left foot on the floor, directly under your knee. Extend your opposite leg for support.  Keeping your heel down, slide your right / left foot back toward the chair, until you feel a stretch at your ankle or calf. If you do not feel a stretch, slide your bottom forward to the edge of the chair, while still keeping your heel down.  Hold this stretch for __________ seconds. Repeat __________ times.  Complete this stretch __________ times per day.  STRENGTHENING EXERCISES - Fibular Fracture These exercises may help you when beginning to recover from your injury. They may resolve your symptoms with or without further involvement from your physician, physical therapist or athletic trainer. While completing these exercises, remember:   Muscles can gain both the endurance and the strength needed for everyday activities through controlled exercises.  Complete these exercises as instructed by your physician, physical therapist or athletic trainer. Increase the resistance and repetitions only as guided.  You may experience muscle soreness or fatigue, but the pain or discomfort you are trying to eliminate should never worsen during these exercises. If this pain does get worse, stop and make certain you are following the directions exactly. If the pain is still present after adjustments, discontinue the exercise until you can discuss the trouble with your clinician. STRENGTH - Dorsiflexors  Secure a rubber exercise band or tubing to a fixed object (table, pole) and loop the other end around your right / left foot.  Sit on the floor, facing the fixed object. The band should be slightly tense when your foot is relaxed.  Slowly draw your foot back toward you, using your ankle and toes.  Hold this position for __________ seconds. Slowly release the tension in the band and return your foot to the starting position. Repeat __________ times. Complete this exercise __________ times per day.  STRENGTH - Plantar-flexors  Sit with your right / left leg extended. Holding onto both ends of a rubber exercise band or tubing, loop it around the ball of your foot. Keep a slight tension in the band.  Slowly push your toes away from you, pointing them downward.  Hold this position for __________ seconds. Return to the starting position slowly, controlling the tension in the band. Repeat __________ times. Complete  this exercise __________ times per day.  STRENGTH - Plantar-flexors, Standing   Stand with your feet shoulder width apart. Place your hands on a wall or table to steady yourself, using as little support as needed.  Keeping your weight evenly spread over the width of your feet, rise up on your toes.*  Hold this position for __________ seconds. Repeat __________ times. Complete this exercise __________ times per day.  *If this is too easy, shift your weight toward your right / left leg until you feel challenged. Ultimately, you may be asked to do this exercise while standing on your right / left foot only. STRENGTH - Towel Curls  Sit in a chair, on a non-carpeted surface.  Place your foot on a towel, keeping your heel on the floor.  Pull the towel toward your heel only by curling your toes. Keep your heel on the floor.  If instructed by your physician, physical therapist or athletic trainer, add ____________________ at the end of the towel. Repeat __________ times. Complete this exercise __________ times per day.  STRENGTH - Ankle Eversion  Secure one end of a rubber exercise band or tubing to a fixed object (table, pole). Loop the other end around your foot, just before your toes.  Place your fists between your knees. This will focus your strengthening at your ankle.  Drawing the band across your opposite foot, away from the pole, slowly pull your little toe out and up. Make sure the band is positioned to resist the entire motion.  Hold this position for __________ seconds.  Return to the starting position slowly, controlling the tension in the band. Repeat __________ times. Complete this exercise __________ times per day.  STRENGTH - Ankle Inversion  Secure one end of a rubber exercise band or tubing to a fixed object (table, pole). Loop the other end around your foot, just before your toes.  Place your fists between your knees. This will focus your strengthening at your  ankle.  Slowly, pull your big toe up and in, making sure the band is positioned to resist the entire motion.  Hold this position for __________ seconds.  Return to the starting position slowly, controlling the tension in the band. Repeat __________ times. Complete this exercises __________ times per day.  Document Released: 06/17/2005 Document Revised: 09/09/2011 Document Reviewed: 09/29/2008 [facility] Patient Information 2014 [place], [place].

## 2013-04-16 NOTE — ED Provider Notes (Signed)
CSN: [id number]     Arrival date & time 04/16/13  0602 History   First MD Initiated Contact with Patient 04/16/13 [phone]     No chief complaint on file.  (Consider location/radiation/quality/duration/timing/severity/associated sxs/prior Treatment) HPI Comments: Patient presents to the ED with a chief complaint of left ankle pain.  Patient states that he was "trying to intercept his 70 month old grandbaby from going down the stairs" last night when he twisted his ankle.  The injury occurred last night. Patient reports moderate to severe pain that is worsened with weight bearing activity.  He has not tried anything to alleviate his symptoms.    The history is provided by the patient. No language interpreter was used.    Past Medical History  Diagnosis Date  . Nonischemic cardiomyopathy     with EF 35%, s/p CRT-D device implanted  . Atrial fibrillation   . Unspecified sleep apnea   . HTN (hypertension)   . DVT (deep venous thrombosis)     recurrent. s/p IVC filter.   . Renal insufficiency   . Gout   . COPD (chronic obstructive pulmonary disease)   . CHF (congestive heart failure)    Past Surgical History  Procedure Laterality Date  . Cardiac defibrillator placement      initial BiV ICD 09/22/00 in [place].  Gen change (MDT) by Dr Amil Amen 10/30/09  . Pacemaker insertion    . Video bronchoscopy  02/26/2012    Procedure: VIDEO BRONCHOSCOPY WITHOUT FLUORO;  Surgeon: Barbaraann Share, MD;  Location: [facility];  Service: Cardiopulmonary;  Laterality: Bilateral;   Family History  Problem Relation Age of Onset  . Heart disease Mother     mother also had Rheumatism.   History  Substance Use Topics  . Smoking status: Former Smoker -- 1.50 packs/day for 30 years    Types: Cigarettes    Quit date: 07/02/1983  . Smokeless tobacco: Never Used  . Alcohol Use: No     Comment: quit after 40 years    Review of Systems  All other systems reviewed and are negative.    Allergies  Review of  patient's allergies indicates no known allergies.  Home Medications   Current Outpatient Rx  Name  Route  Sig  Dispense  Refill  . Ascorbic Acid (VITAMIN C) 1000 MG tablet   Oral   Take 1,000 mg by mouth daily.           Marland Kitchen aspirin 81 MG tablet   Oral   Take 81 mg by mouth daily.           . budesonide-formoterol (SYMBICORT) 160-4.5 MCG/ACT inhaler   Inhalation   Inhale 2 puffs into the lungs 2 (two) times daily.         . calcitRIOL (ROCALTROL) 0.5 MCG capsule   Oral   Take 0.5 mcg by mouth every other day.          . digoxin (LANOXIN) 0.125 MG tablet   Oral   Take 125 mcg by mouth daily.           . Febuxostat (ULORIC) 80 MG TABS   Oral   Take 80 mg by mouth daily.          . furosemide (LASIX) 40 MG tablet   Oral   Take 40 mg by mouth daily.         Marland Kitchen levalbuterol (XOPENEX HFA) 45 MCG/ACT inhaler   Inhalation   Inhale 2 puffs into the lungs every  6 (six) hours as needed for wheezing.          . metoprolol succinate (TOPROL-XL) 50 MG 24 hr tablet   Oral   Take 25 mg by mouth daily. Take with or immediately following a meal. Takes 1/2 tablet = 25 mgm.         . pregabalin (LYRICA) 150 MG capsule   Oral   Take 150 mg by mouth 2 (two) times daily.           . Tamsulosin HCl (FLOMAX) 0.4 MG CAPS   Oral   Take 0.4 mg by mouth daily.           Marland Kitchen tiotropium (SPIRIVA) 18 MCG inhalation capsule   Inhalation   Place 18 mcg into inhaler and inhale daily.          BP 135/90  Pulse 89  Temp(Src) 97.5 F (36.4 C) (Oral)  SpO2 90% Physical Exam  Nursing note and vitals reviewed. Constitutional: He is oriented to person, place, and time. He appears well-developed and well-nourished.  HENT:  Head: Normocephalic and atraumatic.  Eyes: Conjunctivae and EOM are normal.  Neck: Normal range of motion.  Cardiovascular: Normal rate, regular rhythm and intact distal pulses.   Distal pulses with brisk cap refill  Pulmonary/Chest: Effort normal.   Abdominal: He exhibits no distension.  Musculoskeletal: Normal range of motion.  Left ankle moderately tender to palpation Moderate swelling (patient states his leg stays swollen) ROM and strength are reduced 2/2 pain Pain with ambulation  Neurological: He is alert and oriented to person, place, and time.  Skin: Skin is dry.  Psychiatric: He has a normal mood and affect. His behavior is normal. Judgment and thought content normal.    ED Course  Procedures (including critical care time) Dg Ankle Complete Left  04/16/2013   *RADIOLOGY REPORT*  Clinical Data:  Pain post trauma  LEFT ANKLE COMPLETE - 3+ VIEW  Comparison: None.  Findings:  Frontal, oblique, and lateral views were obtained. There is a spiral fracture of the distal fibular diaphysis in near anatomic alignment.  There is soft tissue swelling diffusely.  No other fracture.  Ankle mortise appears intact.  No joint effusion.  IMPRESSION: Spiral fracture distal fibula.  Soft tissue swelling.   Original Report Authenticated By: Bretta Bang, M.D.     EKG Interpretation   None       MDM   1. Displaced spiral fracture of shaft of fibula, left, closed, initial encounter    Patient with left ankle pain following twisting it last night following mechanical fall.  Will check plain films and re-evaluate.    Patient has history of DVT.  He has a filter for recurrent DVTs.  He is taking a baby aspirin.  He is not otherwise anticoagulated.  Patient with spiral fracture of fibula.  Will place patient in a cam-walker and discharge to home with ortho follow-up.  Will give pain meds, recommend 1 tablet of percocet q6h.  RICE therapy.  Patient seen by and discussed with Dr. Lynelle Doctor, who agrees with the plan.    Roxy Horseman, PA-C 04/16/13 [phone]

## 2013-04-16 NOTE — ED Provider Notes (Signed)
Pt states he tried to get out of his chair quickly to stop a child from getting close to stairs and somehow he twisted his left ankle. This happened about 6:30 pm last night. He denies hitting his head or other injury. He has pain and swelling in his left ankle.   Pt has bilateral ankle swelling which he states is old, however the left is more swollen. He is noted to have a swelling on the instep of his right foot medially which is old.   Medical screening examination/treatment/procedure(s) were conducted as a shared visit with non-physician practitioner(s) and myself.  I personally evaluated the patient during the encounter  Devoria Albe, MD, Franz Dell, MD 04/16/13 [phone]

## 2013-04-16 NOTE — ED Notes (Signed)
Patient states that he was attempting to preventing his toddler grandchild from falling down a flight of steps. He suddenly went from a sitting to standing position in a effort to keep the child from falling. He denies taking any pain medications at home prior to coming in. The pain has become progressively worse.

## 2013-04-22 NOTE — ED Provider Notes (Signed)
See prior note   Ward Givens, MD 04/22/13 [phone]

## 2013-05-03 ENCOUNTER — Encounter: Payer: Self-pay | Admitting: Internal Medicine

## 2013-05-03 ENCOUNTER — Ambulatory Visit (INDEPENDENT_AMBULATORY_CARE_PROVIDER_SITE_OTHER): Payer: Medicare Other | Admitting: *Deleted

## 2013-05-03 DIAGNOSIS — I519 Heart disease, unspecified: Secondary | ICD-10-CM

## 2013-05-03 DIAGNOSIS — I4891 Unspecified atrial fibrillation: Secondary | ICD-10-CM

## 2013-05-04 ENCOUNTER — Encounter: Payer: Self-pay | Admitting: Interventional Cardiology

## 2013-05-04 LAB — REMOTE ICD DEVICE
AL AMPLITUDE: 2.4 mv
AL IMPEDENCE ICD: 437 Ohm
AL THRESHOLD: 1 V
BATTERY VOLTAGE: 2.9582 V
CHARGE TIME: 10.209 s
FVT: 0
LV LEAD IMPEDENCE ICD: 551 Ohm
PACEART VT: 0
RV LEAD AMPLITUDE: 20 mv
RV LEAD IMPEDENCE ICD: 285 Ohm
TOT-0001: 1
TOT-0002: 0
TOT-0006: 20110502000000
TZAT-0001ATACH: 1
TZAT-0001ATACH: 2
TZAT-0001ATACH: 3
TZAT-0001FASTVT: 1
TZAT-0001SLOWVT: 1
TZAT-0001SLOWVT: 2
TZAT-0002ATACH: NEGATIVE
TZAT-0002ATACH: NEGATIVE
TZAT-0002FASTVT: NEGATIVE
TZAT-0004SLOWVT: 8
TZAT-0004SLOWVT: 8
TZAT-0012ATACH: 150 ms
TZAT-0012SLOWVT: 200 ms
TZAT-0013SLOWVT: 2
TZAT-0013SLOWVT: 2
TZAT-0018ATACH: NEGATIVE
TZAT-0018ATACH: NEGATIVE
TZAT-0018ATACH: NEGATIVE
TZAT-0018FASTVT: NEGATIVE
TZAT-0018SLOWVT: NEGATIVE
TZAT-0018SLOWVT: NEGATIVE
TZAT-0019ATACH: 6 V
TZAT-0019FASTVT: 8 V
TZAT-0019SLOWVT: 8 V
TZAT-0019SLOWVT: 8 V
TZAT-0020ATACH: 1.5 ms
TZAT-0020SLOWVT: 1.5 ms
TZAT-0020SLOWVT: 1.5 ms
TZON-0003SLOWVT: 350 ms
TZON-0003VSLOWVT: 400 ms
TZON-0004SLOWVT: 32
TZON-0004VSLOWVT: 36
TZON-0005SLOWVT: 12
TZST-0001ATACH: 5
TZST-0001ATACH: 6
TZST-0001FASTVT: 3
TZST-0001FASTVT: 4
TZST-0001FASTVT: 5
TZST-0001FASTVT: 6
TZST-0001SLOWVT: 3
TZST-0001SLOWVT: 4
TZST-0001SLOWVT: 5
TZST-0001SLOWVT: 6
TZST-0002ATACH: NEGATIVE
TZST-0002ATACH: NEGATIVE
TZST-0002ATACH: NEGATIVE
TZST-0002FASTVT: NEGATIVE
TZST-0002FASTVT: NEGATIVE
TZST-0002FASTVT: NEGATIVE
TZST-0003SLOWVT: 35 J
VENTRICULAR PACING ICD: 99.24 pct
VF: 0

## 2013-05-10 ENCOUNTER — Encounter: Payer: Self-pay | Admitting: *Deleted

## 2013-05-24 ENCOUNTER — Other Ambulatory Visit (HOSPITAL_COMMUNITY): Payer: Self-pay | Admitting: Orthopedic Surgery

## 2013-05-24 DIAGNOSIS — M7989 Other specified soft tissue disorders: Secondary | ICD-10-CM

## 2013-05-24 DIAGNOSIS — M25562 Pain in left knee: Secondary | ICD-10-CM

## 2013-05-25 ENCOUNTER — Ambulatory Visit (HOSPITAL_COMMUNITY)
Admission: RE | Admit: 2013-05-25 | Discharge: 2013-05-25 | Disposition: A | Discharge status: Home / Self Care | Payer: Medicare Other | Source: Ambulatory Visit | Attending: Orthopedic Surgery | Admitting: Orthopedic Surgery

## 2013-05-25 ENCOUNTER — Other Ambulatory Visit: Payer: Self-pay

## 2013-05-25 ENCOUNTER — Emergency Department (HOSPITAL_COMMUNITY): Payer: Medicare Other

## 2013-05-25 ENCOUNTER — Encounter (HOSPITAL_COMMUNITY): Payer: Self-pay | Admitting: Emergency Medicine

## 2013-05-25 ENCOUNTER — Inpatient Hospital Stay (HOSPITAL_COMMUNITY)
Admission: EM | Admit: 2013-05-25 | Discharge: 2013-06-01 | DRG: 300 | Disposition: A | Discharge status: Home Health | Payer: Medicare Other | Attending: Internal Medicine | Admitting: Internal Medicine

## 2013-05-25 DIAGNOSIS — I824Z9 Acute embolism and thrombosis of unspecified deep veins of unspecified distal lower extremity: Secondary | ICD-10-CM | POA: Diagnosis present

## 2013-05-25 DIAGNOSIS — J189 Pneumonia, unspecified organism: Secondary | ICD-10-CM

## 2013-05-25 DIAGNOSIS — N19 Unspecified kidney failure: Secondary | ICD-10-CM

## 2013-05-25 DIAGNOSIS — I4891 Unspecified atrial fibrillation: Secondary | ICD-10-CM | POA: Diagnosis present

## 2013-05-25 DIAGNOSIS — J8410 Pulmonary fibrosis, unspecified: Secondary | ICD-10-CM | POA: Diagnosis present

## 2013-05-25 DIAGNOSIS — M7989 Other specified soft tissue disorders: Secondary | ICD-10-CM | POA: Diagnosis present

## 2013-05-25 DIAGNOSIS — I5022 Chronic systolic (congestive) heart failure: Secondary | ICD-10-CM | POA: Diagnosis present

## 2013-05-25 DIAGNOSIS — Z9981 Dependence on supplemental oxygen: Secondary | ICD-10-CM

## 2013-05-25 DIAGNOSIS — R0602 Shortness of breath: Secondary | ICD-10-CM

## 2013-05-25 DIAGNOSIS — R0600 Dyspnea, unspecified: Secondary | ICD-10-CM | POA: Diagnosis present

## 2013-05-25 DIAGNOSIS — R042 Hemoptysis: Secondary | ICD-10-CM | POA: Diagnosis present

## 2013-05-25 DIAGNOSIS — J441 Chronic obstructive pulmonary disease with (acute) exacerbation: Secondary | ICD-10-CM

## 2013-05-25 DIAGNOSIS — I428 Other cardiomyopathies: Secondary | ICD-10-CM | POA: Diagnosis present

## 2013-05-25 DIAGNOSIS — Z8249 Family history of ischemic heart disease and other diseases of the circulatory system: Secondary | ICD-10-CM

## 2013-05-25 DIAGNOSIS — G4733 Obstructive sleep apnea (adult) (pediatric): Secondary | ICD-10-CM | POA: Diagnosis present

## 2013-05-25 DIAGNOSIS — R0902 Hypoxemia: Secondary | ICD-10-CM | POA: Diagnosis present

## 2013-05-25 DIAGNOSIS — M25562 Pain in left knee: Secondary | ICD-10-CM

## 2013-05-25 DIAGNOSIS — J4489 Other specified chronic obstructive pulmonary disease: Secondary | ICD-10-CM | POA: Diagnosis present

## 2013-05-25 DIAGNOSIS — I509 Heart failure, unspecified: Secondary | ICD-10-CM | POA: Diagnosis present

## 2013-05-25 DIAGNOSIS — R918 Other nonspecific abnormal finding of lung field: Secondary | ICD-10-CM | POA: Diagnosis present

## 2013-05-25 DIAGNOSIS — N183 Chronic kidney disease, stage 3 unspecified: Secondary | ICD-10-CM | POA: Diagnosis present

## 2013-05-25 DIAGNOSIS — I82402 Acute embolism and thrombosis of unspecified deep veins of left lower extremity: Secondary | ICD-10-CM

## 2013-05-25 DIAGNOSIS — I519 Heart disease, unspecified: Secondary | ICD-10-CM

## 2013-05-25 DIAGNOSIS — Z86718 Personal history of other venous thrombosis and embolism: Secondary | ICD-10-CM

## 2013-05-25 DIAGNOSIS — Z7982 Long term (current) use of aspirin: Secondary | ICD-10-CM

## 2013-05-25 DIAGNOSIS — S8290XD Unspecified fracture of unspecified lower leg, subsequent encounter for closed fracture with routine healing: Secondary | ICD-10-CM

## 2013-05-25 DIAGNOSIS — Z95810 Presence of automatic (implantable) cardiac defibrillator: Secondary | ICD-10-CM

## 2013-05-25 DIAGNOSIS — I499 Cardiac arrhythmia, unspecified: Secondary | ICD-10-CM

## 2013-05-25 DIAGNOSIS — Z95828 Presence of other vascular implants and grafts: Secondary | ICD-10-CM

## 2013-05-25 DIAGNOSIS — I82409 Acute embolism and thrombosis of unspecified deep veins of unspecified lower extremity: Secondary | ICD-10-CM | POA: Diagnosis present

## 2013-05-25 DIAGNOSIS — J449 Chronic obstructive pulmonary disease, unspecified: Secondary | ICD-10-CM | POA: Diagnosis present

## 2013-05-25 DIAGNOSIS — I129 Hypertensive chronic kidney disease with stage 1 through stage 4 chronic kidney disease, or unspecified chronic kidney disease: Secondary | ICD-10-CM | POA: Diagnosis present

## 2013-05-25 DIAGNOSIS — I10 Essential (primary) hypertension: Secondary | ICD-10-CM

## 2013-05-25 DIAGNOSIS — R55 Syncope and collapse: Secondary | ICD-10-CM

## 2013-05-25 DIAGNOSIS — Z87891 Personal history of nicotine dependence: Secondary | ICD-10-CM

## 2013-05-25 LAB — BLOOD GAS, ARTERIAL
Acid-Base Excess: 2.7 mmol/L — ABNORMAL HIGH (ref 0.0–2.0)
Bicarbonate: 27.8 mEq/L — ABNORMAL HIGH (ref 20.0–24.0)
Drawn by: 31101
FIO2: 1 %
O2 Saturation: 96.5 %
Patient temperature: 98.6
TCO2: 24.6 mmol/L (ref 0–100)
pCO2 arterial: 46.7 mmHg — ABNORMAL HIGH (ref 35.0–45.0)

## 2013-05-25 LAB — POCT I-STAT, CHEM 8
BUN: 18 mg/dL (ref 6–23)
Chloride: 102 mEq/L (ref 96–112)
Glucose, Bld: 89 mg/dL (ref 70–99)
HCT: 45 % (ref 39.0–52.0)
Sodium: 143 mEq/L (ref 135–145)
TCO2: 30 mmol/L (ref 0–100)

## 2013-05-25 LAB — CBC WITH DIFFERENTIAL/PLATELET
Basophils Absolute: 0 10*3/uL (ref 0.0–0.1)
Basophils Relative: 0 % (ref 0–1)
HCT: 41.2 % (ref 39.0–52.0)
MCHC: 32.5 g/dL (ref 30.0–36.0)
Neutro Abs: 3.3 10*3/uL (ref 1.7–7.7)
Neutrophils Relative %: 67 % (ref 43–77)
Platelets: 123 10*3/uL — ABNORMAL LOW (ref 150–400)
RBC: 4.79 MIL/uL (ref 4.22–5.81)
RDW: 15.9 % — ABNORMAL HIGH (ref 11.5–15.5)
WBC: 4.9 10*3/uL (ref 4.0–10.5)

## 2013-05-25 LAB — BASIC METABOLIC PANEL
CO2: 28 mEq/L (ref 19–32)
Calcium: 10 mg/dL (ref 8.4–10.5)
Chloride: 102 mEq/L (ref 96–112)
GFR calc Af Amer: 41 mL/min — ABNORMAL LOW (ref >90)
Potassium: 4.2 mEq/L (ref 3.5–5.1)

## 2013-05-25 LAB — PROTIME-INR: Prothrombin Time: 13.8 seconds (ref 11.6–15.2)

## 2013-05-25 LAB — TROPONIN I: Troponin I: <0.3 ng/mL (ref <0.30)

## 2013-05-25 LAB — PRO B NATRIURETIC PEPTIDE: Pro B Natriuretic peptide (BNP): 1513 pg/mL — ABNORMAL HIGH (ref 0–450)

## 2013-05-25 MED ORDER — SODIUM CHLORIDE 0.9 % IJ SOLN
3.0000 mL | Freq: Two times a day (BID) | INTRAMUSCULAR | Status: DC
  Administered 2013-05-25 – 2013-05-28 (×3): 3 mL via INTRAVENOUS

## 2013-05-25 MED ORDER — FEBUXOSTAT 80 MG PO TABS: 80.0000 mg | ORAL_TABLET | Freq: Every morning | ORAL | Status: DC

## 2013-05-25 MED ORDER — DOCUSATE SODIUM 100 MG PO CAPS
100.0000 mg | ORAL_CAPSULE | Freq: Two times a day (BID) | ORAL | Status: DC
  Administered 2013-05-25 – 2013-06-01 (×14): 100 mg via ORAL
  Filled 2013-05-25 (×15): qty 1

## 2013-05-25 MED ORDER — BUDESONIDE-FORMOTEROL FUMARATE 160-4.5 MCG/ACT IN AERO
2.0000 | INHALATION_SPRAY | Freq: Two times a day (BID) | RESPIRATORY_TRACT | Status: DC
  Administered 2013-05-25 – 2013-06-01 (×14): 2 via RESPIRATORY_TRACT
  Filled 2013-05-25: qty 6

## 2013-05-25 MED ORDER — DIGOXIN 125 MCG PO TABS
125.0000 ug | ORAL_TABLET | Freq: Every morning | ORAL | Status: DC
  Administered 2013-05-26 – 2013-06-01 (×7): 125 ug via ORAL
  Filled 2013-05-25 (×8): qty 1

## 2013-05-25 MED ORDER — ALBUTEROL SULFATE (5 MG/ML) 0.5% IN NEBU
INHALATION_SOLUTION | RESPIRATORY_TRACT | Status: AC
  Filled 2013-05-25: qty 0.5

## 2013-05-25 MED ORDER — PREDNISONE 10 MG PO TABS
60.0000 mg | ORAL_TABLET | Freq: Two times a day (BID) | ORAL | Status: DC
  Administered 2013-05-25 – 2013-05-28 (×6): 60 mg via ORAL
  Filled 2013-05-25 (×8): qty 1

## 2013-05-25 MED ORDER — GUAIFENESIN ER 600 MG PO TB12
600.0000 mg | ORAL_TABLET | Freq: Two times a day (BID) | ORAL | Status: DC
  Administered 2013-05-25 – 2013-06-01 (×14): 600 mg via ORAL
  Filled 2013-05-25 (×15): qty 1

## 2013-05-25 MED ORDER — TECHNETIUM TC 99M DIETHYLENETRIAME-PENTAACETIC ACID
45.0000 | Freq: Once | INTRAVENOUS | Status: AC | PRN
  Administered 2013-05-25: 45 via INTRAVENOUS

## 2013-05-25 MED ORDER — OXYCODONE HCL 5 MG PO TABS
5.0000 mg | ORAL_TABLET | ORAL | Status: DC | PRN
  Administered 2013-05-27: 5 mg via ORAL
  Filled 2013-05-25 (×2): qty 1

## 2013-05-25 MED ORDER — FEBUXOSTAT 40 MG PO TABS
80.0000 mg | ORAL_TABLET | Freq: Every day | ORAL | Status: DC
  Administered 2013-05-26 – 2013-06-01 (×7): 80 mg via ORAL
  Filled 2013-05-25 (×8): qty 2

## 2013-05-25 MED ORDER — FUROSEMIDE 10 MG/ML IJ SOLN
INTRAMUSCULAR | Status: AC
  Filled 2013-05-25: qty 4

## 2013-05-25 MED ORDER — HEPARIN (PORCINE) IN NACL 100-0.45 UNIT/ML-% IJ SOLN
1700.0000 [IU]/h | INTRAMUSCULAR | Status: DC
  Administered 2013-05-25: 21:00:00 1700 [IU]/h via INTRAVENOUS
  Filled 2013-05-25 (×2): qty 250

## 2013-05-25 MED ORDER — ALBUTEROL SULFATE (5 MG/ML) 0.5% IN NEBU: 2.5000 mg | INHALATION_SOLUTION | Freq: Once | RESPIRATORY_TRACT | Status: DC

## 2013-05-25 MED ORDER — OLOPATADINE HCL 0.1 % OP SOLN
1.0000 [drp] | Freq: Two times a day (BID) | OPHTHALMIC | Status: DC
  Administered 2013-05-26 – 2013-06-01 (×10): 1 [drp] via OPHTHALMIC
  Filled 2013-05-25 (×2): qty 5

## 2013-05-25 MED ORDER — FUROSEMIDE 40 MG PO TABS
40.0000 mg | ORAL_TABLET | Freq: Every morning | ORAL | Status: DC
  Administered 2013-05-26 – 2013-06-01 (×7): 40 mg via ORAL
  Filled 2013-05-25 (×8): qty 1

## 2013-05-25 MED ORDER — SODIUM CHLORIDE 0.9 % IV SOLN: 250.0000 mL | INTRAVENOUS | Status: DC | PRN

## 2013-05-25 MED ORDER — TIOTROPIUM BROMIDE MONOHYDRATE 18 MCG IN CAPS
18.0000 ug | ORAL_CAPSULE | Freq: Every morning | RESPIRATORY_TRACT | Status: DC
  Administered 2013-05-26 – 2013-06-01 (×7): 18 ug via RESPIRATORY_TRACT
  Filled 2013-05-25 (×2): qty 5

## 2013-05-25 MED ORDER — ONDANSETRON HCL 4 MG PO TABS: 4.0000 mg | ORAL_TABLET | Freq: Four times a day (QID) | ORAL | Status: DC | PRN

## 2013-05-25 MED ORDER — ONDANSETRON HCL 4 MG/2ML IJ SOLN: 4.0000 mg | Freq: Four times a day (QID) | INTRAMUSCULAR | Status: DC | PRN

## 2013-05-25 MED ORDER — LEVALBUTEROL HCL 0.63 MG/3ML IN NEBU
0.6300 mg | INHALATION_SOLUTION | Freq: Four times a day (QID) | RESPIRATORY_TRACT | Status: DC
  Administered 2013-05-25 – 2013-05-29 (×17): 0.63 mg via RESPIRATORY_TRACT
  Filled 2013-05-25 (×26): qty 3

## 2013-05-25 MED ORDER — FUROSEMIDE 10 MG/ML IJ SOLN
20.0000 mg | Freq: Once | INTRAMUSCULAR | Status: AC
  Administered 2013-05-25: 20 mg via INTRAVENOUS

## 2013-05-25 MED ORDER — PREGABALIN 75 MG PO CAPS
150.0000 mg | ORAL_CAPSULE | Freq: Two times a day (BID) | ORAL | Status: DC
  Administered 2013-05-25 – 2013-06-01 (×14): 150 mg via ORAL
  Filled 2013-05-25 (×14): qty 2

## 2013-05-25 MED ORDER — TECHNETIUM TO 99M ALBUMIN AGGREGATED
5.2000 | Freq: Once | INTRAVENOUS | Status: AC | PRN
  Administered 2013-05-25: 5 via INTRAVENOUS

## 2013-05-25 MED ORDER — METOPROLOL SUCCINATE ER 25 MG PO TB24
25.0000 mg | ORAL_TABLET | Freq: Every morning | ORAL | Status: DC
  Administered 2013-05-26 – 2013-06-01 (×7): 25 mg via ORAL
  Filled 2013-05-25 (×8): qty 1

## 2013-05-25 MED ORDER — SODIUM CHLORIDE 0.9 % IJ SOLN
3.0000 mL | INTRAMUSCULAR | Status: DC | PRN
  Administered 2013-06-01: 3 mL via INTRAVENOUS

## 2013-05-25 MED ORDER — CALCITRIOL 0.5 MCG PO CAPS
0.5000 ug | ORAL_CAPSULE | ORAL | Status: DC
  Administered 2013-05-25 – 2013-05-31 (×4): 0.5 ug via ORAL
  Filled 2013-05-25 (×4): qty 1

## 2013-05-25 MED ORDER — SODIUM CHLORIDE 0.9 % IJ SOLN
3.0000 mL | Freq: Two times a day (BID) | INTRAMUSCULAR | Status: DC
  Administered 2013-05-26: 22:00:00 10 mL via INTRAVENOUS
  Administered 2013-05-28: 22:00:00 3 mL via INTRAVENOUS

## 2013-05-25 MED ORDER — ACETAMINOPHEN 325 MG PO TABS: 650.0000 mg | ORAL_TABLET | Freq: Four times a day (QID) | ORAL | Status: DC | PRN

## 2013-05-25 MED ORDER — TAMSULOSIN HCL 0.4 MG PO CAPS
0.4000 mg | ORAL_CAPSULE | Freq: Every morning | ORAL | Status: DC
  Administered 2013-05-26 – 2013-06-01 (×7): 0.4 mg via ORAL
  Filled 2013-05-25 (×8): qty 1

## 2013-05-25 MED ORDER — LEVALBUTEROL HCL 0.63 MG/3ML IN NEBU: 0.6300 mg | INHALATION_SOLUTION | RESPIRATORY_TRACT | Status: DC | PRN

## 2013-05-25 MED ORDER — ACETAMINOPHEN 650 MG RE SUPP: 650.0000 mg | Freq: Four times a day (QID) | RECTAL | Status: DC | PRN

## 2013-05-25 NOTE — Progress Notes (Signed)
Patient arrived from ED to unit with labored breathing and oxygen saturation of 78% on 2L/M. Patient states he uses oxygen at home at 2l/M [place].  Increased oxygen to 6 l/M but oxygen saturation remained in low to mid 80s.  Respiratory paged.  Patient placed on nonrebreather and oxygen saturation slowly came up to low 90s.  Continuous pulse oximetry started.  MD paged with findings.  New orders for stat ABG, xopenex treatment, and Lasix IV x1.  Will administer medication, page respiratory and continue to monitor.

## 2013-05-25 NOTE — Progress Notes (Signed)
*  PRELIMINARY RESULTS* Vascular Ultrasound Left lower extremity venous duplex has been completed.  Preliminary findings: Evidence of subacute vs chronic DVT involving the left common femoral, left profunda femoral, and left popliteal veins.  Called answering service to page Dr. [name] or On Call MD to give results before patient leaves. 12:25pm Talked to Marmet with ortho. He will call PCP and call back. 12.33pm Mellody Dance talked to Dr. Mikeal Hawthorne, PCP, and they want patient sent to ED to start treatment. 12:39pm Took patient to [facility] ED. 12:45pm     Farrel Demark, RDMS, RVT  05/25/2013, 12:23 PM

## 2013-05-25 NOTE — ED Notes (Signed)
Decreased to 2lnc per pt requested on 2lnc at home. 02 sats 95%

## 2013-05-25 NOTE — ED Provider Notes (Signed)
I received this patient in signout by Dr. Blinda Leatherwood.  Patient found to have a DVT of the left lower extremity. CT scan was pending at time of signout. VQ scan is low probability for PE. Patient is a poor anticoagulation candidate given chronic hemoptysis. I discussed the patient with the hospitalist who will evaluate the patient and decide on course of treatment. Anticipate the patient will be admitted.  Shon Baton, MD 05/25/13 [phone]

## 2013-05-25 NOTE — Progress Notes (Signed)
Called ED 3 times for report, put on hold each time.  After about 10-15 minutes, report was given.

## 2013-05-25 NOTE — ED Notes (Signed)
Pt is in Nuclear Medicine

## 2013-05-25 NOTE — ED Notes (Signed)
Pt returned from nuc med

## 2013-05-25 NOTE — Progress Notes (Signed)
Utilization Review completed.  Jesseca Marsch RN CM  

## 2013-05-25 NOTE — ED Notes (Signed)
Pt brought from vascular lab for DVT in left leg for treatment.

## 2013-05-25 NOTE — ED Notes (Signed)
MD at bedside. 

## 2013-05-25 NOTE — ED Notes (Signed)
Patient laying flat for 2 minutes.... O2...Marland KitchenMarland KitchenMarland Kitchen 90

## 2013-05-25 NOTE — H&P (Signed)
[facility] History and Physical  Maurice Delgado YNW:[id number] DOB: Oct 24, 1933 DOA: 05/25/2013   PCP: [name], MD  Specialists: His followed by Dr. Marcelyn Bruins for COPD and hemoptysis. He is followed Dr. Verdis Prime for congestive heart failure.  Chief Complaint: Left leg swelling  HPI: Maurice Delgado is a 79 y.o. male with a past medical history of nonischemic cardiomyopathy with EF of about 20-25% based on echocardiogram done in January of this year, history of atrial fibrillation not on anticoagulation, history of chronic hemoptysis with pulmonary infiltrates of unclear etiology, COPD, obstructive sleep apnea on CPAP, who sustained an injury about 5 weeks ago, resulting in a spiral fracture of the distal fibula on the left. Patient has been followed by Dr. [name] with orthopedics. A nonsurgical approach was taken and the patient was placed in special boot. He's had decreased ambulation over the last few weeks. He does have a history of a DVT on that left leg and is status post an IVC filter that was placed in 2007 when he was in [place]. His left leg is always swollen, but in the last few weeks he has noticed worsening swelling. He's also noticed some pain. He mentioned this to his orthopedic doctor earlier today when he went for his followup and he underwent a Doppler study, which revealed subacute DVT on that side. Patient denies any chest pains. However, he has been having some shortness of breath over the last couple weeks. This is worse in the last few days. He states he ran out of Symbicort about 2 weeks ago. He is on home oxygen at 2-3 L per minute, which is continuous. Denies any fever or chills. No cough.  Home Medications: Prior to Admission medications   Medication Sig Start Date End Date Taking? Authorizing Provider  Ascorbic Acid (VITAMIN C) 1000 MG tablet Take 1,000 mg by mouth every morning.    Yes Historical Provider, MD  aspirin 81 MG tablet Take 81 mg by mouth  every morning.    Yes Historical Provider, MD  budesonide-formoterol (SYMBICORT) 160-4.5 MCG/ACT inhaler Inhale 2 puffs into the lungs 2 (two) times daily.   Yes Historical Provider, MD  calcitRIOL (ROCALTROL) 0.5 MCG capsule Take 0.5 mcg by mouth every other day.    Yes Historical Provider, MD  digoxin (LANOXIN) 0.125 MG tablet Take 125 mcg by mouth every morning.    Yes Historical Provider, MD  Febuxostat (ULORIC) 80 MG TABS Take 80 mg by mouth every morning.    Yes Historical Provider, MD  furosemide (LASIX) 40 MG tablet Take 40 mg by mouth every morning.    Yes Historical Provider, MD  levalbuterol [facility] HFA) 45 MCG/ACT inhaler Inhale 2 puffs into the lungs every 6 (six) hours as needed for wheezing.    Yes Historical Provider, MD  metoprolol succinate (TOPROL-XL) 50 MG 24 hr tablet Take 25 mg by mouth every morning.    Yes Historical Provider, MD  Olopatadine HCl (PATADAY) 0.2 % SOLN Place 1 drop into both eyes every morning.   Yes Historical Provider, MD  pregabalin (LYRICA) 150 MG capsule Take 150 mg by mouth 2 (two) times daily.     Yes Historical Provider, MD  Tamsulosin HCl (FLOMAX) 0.4 MG CAPS Take 0.4 mg by mouth every morning.    Yes Historical Provider, MD  tiotropium (SPIRIVA) 18 MCG inhalation capsule Place 18 mcg into inhaler and inhale every morning.    Yes Historical Provider, MD    Allergies: No Known Allergies  Past  Medical History: Past Medical History  Diagnosis Date  . Nonischemic cardiomyopathy     with EF 35%, s/p CRT-D device implanted  . Atrial fibrillation   . Unspecified sleep apnea   . HTN (hypertension)   . DVT (deep venous thrombosis)     recurrent. s/p IVC filter.   . Renal insufficiency   . Gout   . COPD (chronic obstructive pulmonary disease)   . CHF (congestive heart failure)     Past Surgical History  Procedure Laterality Date  . Cardiac defibrillator placement      initial BiV ICD 09/22/00 in [place].  Gen change (MDT) by Dr Amil Amen 10/30/09  .  Pacemaker insertion    . Video bronchoscopy  02/26/2012    Procedure: VIDEO BRONCHOSCOPY WITHOUT FLUORO;  Surgeon: Barbaraann Share, MD;  Location: [facility];  Service: Cardiopulmonary;  Laterality: Bilateral;  . Foot surgery Right     Social History: Patient Lives with his wife in [place]. He quit smoking in 1985. Denies any alcohol use or illicit drug use. He has been walking with a walker in the last few weeks.  Family History:  Family History  Problem Relation Age of Onset  . Heart disease Mother     mother also had Rheumatism.     Review of Systems - History obtained from the patient General ROS: positive for  - fatigue Psychological ROS: negative Ophthalmic ROS: negative ENT ROS: negative Allergy and Immunology ROS: negative Hematological and Lymphatic ROS: negative Endocrine ROS: negative Respiratory ROS: as in hpi Cardiovascular ROS: no chest pain or dyspnea on exertion Gastrointestinal ROS: no abdominal pain, change in bowel habits, or black or bloody stools Genito-Urinary ROS: no dysuria, trouble voiding, or hematuria Musculoskeletal ROS: as in hpi Neurological ROS: no TIA or stroke symptoms Dermatological ROS: negative  Physical Examination  Filed Vitals:   05/25/13 1302 05/25/13 1303 05/25/13 1452 05/25/13 1642  BP: 130/82  130/82 125/82  Pulse: 85  85 64  Temp: 98 F (36.7 C)  98 F (36.7 C)   TempSrc: Oral  Oral   Resp: 16  17 16   SpO2: 78% 92% 90% 91%    General appearance: alert, cooperative, appears stated age and no distress Head: Normocephalic, without obvious abnormality, atraumatic Eyes: conjunctivae/corneas clear. PERRL, EOM's intact.  Throat: lips, mucosa, and tongue normal; teeth and gums normal Neck: no adenopathy, no carotid bruit, no JVD, supple, symmetrical, trachea midline and thyroid not enlarged, symmetric, no tenderness/mass/nodules Resp: Course breath sounds bilateral bases, without any wheezing. Few crackles at the bases. Cardio:  regular rate and rhythm, S1, S2 normal, no murmur, click, rub or gallop GI: soft, non-tender; bowel sounds normal; no masses,  no organomegaly Extremities: Left leg is significantly larger than the right. Slightly erythematous and warm to touch. Pulses: Difficult to palpate in the lower extremities due to swelling Skin: Skin color, texture, turgor normal. No rashes or lesions Lymph nodes: Cervical, supraclavicular, and axillary nodes normal. Neurologic: He is alert and oriented x3. No focal neurological deficits are present  Laboratory Data: Results for orders placed during the hospital encounter of 05/25/13 (from the past 48 hour(s))  CBC WITH DIFFERENTIAL     Status: Abnormal   Collection Time    05/25/13  1:35 PM      Result Value Range   WBC 4.9  4.0 - 10.5 K/uL   RBC 4.79  4.22 - 5.81 MIL/uL   Hemoglobin 13.4  13.0 - 17.0 g/dL   HCT 16.1  09.6 -  52.0 %   MCV 86.0  78.0 - 100.0 fL   MCH 28.0  26.0 - 34.0 pg   MCHC 32.5  30.0 - 36.0 g/dL   RDW 16.1 (*) 09.6 - 04.5 %   Platelets 123 (*) 150 - 400 K/uL   Neutrophils Relative % 67  43 - 77 %   Neutro Abs 3.3  1.7 - 7.7 K/uL   Lymphocytes Relative 22  12 - 46 %   Lymphs Abs 1.1  0.7 - 4.0 K/uL   Monocytes Relative 11  3 - 12 %   Monocytes Absolute 0.5  0.1 - 1.0 K/uL   Eosinophils Relative 0  0 - 5 %   Eosinophils Absolute 0.0  0.0 - 0.7 K/uL   Basophils Relative 0  0 - 1 %   Basophils Absolute 0.0  0.0 - 0.1 K/uL  BASIC METABOLIC PANEL     Status: Abnormal   Collection Time    05/25/13  1:35 PM      Result Value Range   Sodium 140  135 - 145 mEq/L   Potassium 4.2  3.5 - 5.1 mEq/L   Chloride 102  96 - 112 mEq/L   CO2 28  19 - 32 mEq/L   Glucose, Bld 96  70 - 99 mg/dL   BUN 17  6 - 23 mg/dL   Creatinine, Ser 4.09 (*) 0.50 - 1.35 mg/dL   Calcium 81.1  8.4 - 91.4 mg/dL   GFR calc non Af Amer 36 (*) >90 mL/min   GFR calc Af Amer 41 (*) >90 mL/min   Comment: (NOTE)     The eGFR has been calculated using the CKD EPI equation.      This calculation has not been validated in all clinical situations.     eGFR's persistently <90 mL/min signify possible Chronic Kidney     Disease.  PRO B NATRIURETIC PEPTIDE     Status: Abnormal   Collection Time    05/25/13  1:35 PM      Result Value Range   Pro B Natriuretic peptide (BNP) 1513.0 (*) 0 - 450 pg/mL  TROPONIN I     Status: None   Collection Time    05/25/13  1:35 PM      Result Value Range   Troponin I <0.30  <0.30 ng/mL   Comment:            Due to the release kinetics of cTnI,     a negative result within the first hours     of the onset of symptoms does not rule out     myocardial infarction with certainty.     If myocardial infarction is still suspected,     repeat the test at appropriate intervals.  POCT I-STAT, CHEM 8     Status: Abnormal   Collection Time    05/25/13  1:42 PM      Result Value Range   Sodium 143  135 - 145 mEq/L   Potassium 3.8  3.5 - 5.1 mEq/L   Chloride 102  96 - 112 mEq/L   BUN 18  6 - 23 mg/dL   Creatinine, Ser 7.82 (*) 0.50 - 1.35 mg/dL   Glucose, Bld 89  70 - 99 mg/dL   Calcium, Ion 9.56  2.13 - 1.30 mmol/L   TCO2 30  0 - 100 mmol/L   Hemoglobin 15.3  13.0 - 17.0 g/dL   HCT 08.6  57.8 - 46.9 %  Radiology Reports: Dg Chest 2 View  05/25/2013   CLINICAL DATA:  Shortness of breath.  History of hypertension  EXAM: CHEST  2 VIEW  COMPARISON:  03/10/2013  FINDINGS: The cardiac silhouette is enlarged. A left-sided chest wall AICD unit is identified with lead tips projecting region the right atrium and right ventricle. With variability in technique taking into consideration the stable bilateral interstitial prominence is identified with greatest confluence in the lung bases. No focal regions of consolidation are identified. No new focal infiltrates. There is thickening versus trace fluid along the minor fissure on the right. The osseous structures are unremarkable.  IMPRESSION: 1. Stable chronic interstitial findings likely  reflecting pulmonary fibrosis. A superimposed component of pulmonary vascular congestion cannot be excluded clinically appropriate. 2. No new focal regions of consolidation or new focal infiltrates.   Electronically Signed   By: Salome Holmes M.D.   On: 05/25/2013 15:17   Nm Pulmonary Perf And Vent  05/25/2013   CLINICAL DATA:  Short of breath, elevated D-dimer, COPD  EXAM: NUCLEAR MEDICINE VENTILATION - PERFUSION LUNG SCAN  TECHNIQUE: Ventilation images were obtained in multiple projections using inhaled aerosol technetium 99 M DTPA. Perfusion images were obtained in multiple projections after intravenous injection of Tc-86m MAA.  COMPARISON:  Chest radiograph 05/25/2013, CT thorax 10/27/2012  RADIOPHARMACEUTICALS:  Forty-five mCi Tc-46m DTPA aerosol and 5.2 mCi Tc-65m MAA  FINDINGS: Ventilation: There are linear ventilation defects the left and right lung elated to chronic obstructive pulmonary disease. There is a bandlike defect in the right mid lung.  Perfusion: There no wedge-shaped peripheral perfusion defects. Bandlike perfusion defect in the right mid lung matches the ventilation defect.  IMPRESSION: Very low probability for acute pulmonary embolism.  Ventilation pattern consistent with COPD.   Electronically Signed   By: Genevive Bi M.D.   On: 05/25/2013 16:31    Electrocardiogram: Paced rhythm was noted.  Problem List  Principal Problem:   Lower leg DVT (deep venous thromboembolism), acute Active Problems:   OBSTRUCTIVE SLEEP APNEA   COPD (chronic obstructive pulmonary disease)   Chronic systolic dysfunction of left ventricle   Atrial fibrillation   Hemoptysis, unspecified   Pulmonary infiltrates   Dyspnea   CKD (chronic kidney disease) stage 3, GFR 30-59 ml/min   S/P IVC filter   Acute DVT (deep venous thrombosis)   Assessment: This is a 79 year old male, who presents with worsening swelling of his left leg and is found to have a subacute DVT. This is most likely due to his  recent injury. He is also dyspneic and hypoxic, probably due to his COPD and lung fibrosis. VQ scan was low probability for PE.  Plan: #1 Subacute left lower extremity DVT: Have discussed this complicated scenario with Dr. [name] with vascular surgery. ED physician had discussed this with the interventional radiology. At this time there is no role for local intervention to that leg. The best course of action would be to try anticoagulation. Since he does have a history of hemoptysis, which has been significant in the past, it would be judicious to admit him to the hospital and try intravenous heparin. If he develops hemoptysis heparin will need to be turned off. I will also consult pulmonology to assist with management. Dr. [name] can be consulted officially if needed.  #2 dyspnea in the setting of COPD, and possible pulmonary fibrosis: He ran out of Symbicort 2 weeks ago. That is probably the reason for his acute decompensation. He saturating about 88-90% on 2 L.  He'll be given Nebulizer treatments. He'll also be given a course of steroids, which might help. Pulmonology input in this matter will also be appreciated.  #3 history of nonischemic cardiomyopathy with EF of 20-25%: He has AICD in place. Continue with his home medications. It is noted that he is on digoxin. We'll check a dig Level. Continue with Lasix. He's not an ACE inhibitor probably because of his chronic kidney disease. Continue beta blockers  #4 history of atrial fibrillation: He is has a paced rhythm. He'll be monitored on telemetry.  #5 history of hemoptysis with pulmonary fibrosis: Patient has refused biopsies in the past. He states that the. Hemoptysis has significantly improved. However, he has noticed occasional dark clots when he coughs. It has not been significant, and it's only been once in a while. Pulmonology has been consulted, especially because he will be placed on intravenous heparin as discussed above.  #6 history of  chronic kidney disease, stage III: Continue monitoring renal function closely. Monitor urine output.  #7 history of obstructive sleep apnea: Utilize CPAP while sleeping.   #8 recent left fibular fracture: Nonsurgical approach was taken. According to the wife, Dr. [name] told him that the fracture was healing quite well. They wanted to see him in the office in 3 weeks. PT/OT.   DVT Prophylaxis: On full dose heparin Code Status: Full code Family Communication: Discussed with the patient and his wife  Disposition Plan: Admit to telemetry   Further management decisions will depend on results of further testing and patient's response to treatment.  [facility]  [facility] Pager [phone]  If 7PM-7AM, please contact night-coverage [email or web address] Password [facility]  05/25/2013, 5:56 PM

## 2013-05-25 NOTE — Progress Notes (Addendum)
ANTICOAGULATION CONSULT NOTE - Initial Consult  Pharmacy Consult for Heparin Indication: VTE Treatment-- LLE DVT  No Known Allergies  Patient Measurements: Height: 6\' 2"  (188 cm) Weight: 272 lb 4.3 oz (123.5 kg) IBW/kg (Calculated) : 82.2 Heparin Dosing Weight: 109kg  Vital Signs: Temp: 97.5 F (36.4 C) (11/25 1857) Temp src: Oral (11/25 1857) BP: 150/106 mmHg (11/25 1857) Pulse Rate: 79 (11/25 1857)  Labs:  Recent Labs  05/25/13 1335 05/25/13 1342  HGB 13.4 15.3  HCT 41.2 45.0  PLT 123*  --   CREATININE 1.76* 1.90*  TROPONINI <0.30  --     Estimated Creatinine Clearance: 45.5 ml/min (by C-G formula based on Cr of 1.9).   Medical History: Past Medical History  Diagnosis Date  . Nonischemic cardiomyopathy     with EF 35%, s/p CRT-D device implanted  . Atrial fibrillation   . Unspecified sleep apnea   . HTN (hypertension)   . DVT (deep venous thrombosis)     recurrent. s/p IVC filter.   . Renal insufficiency   . Gout   . COPD (chronic obstructive pulmonary disease)   . CHF (congestive heart failure)     Medications:  Scheduled:  . budesonide-formoterol  2 puff Inhalation BID  . calcitRIOL  0.5 mcg Oral QODAY  . [START ON 05/26/2013] digoxin  125 mcg Oral q morning - 10a  . docusate sodium  100 mg Oral BID  . [START ON 05/26/2013] febuxostat  80 mg Oral Daily  . [START ON 05/26/2013] furosemide  40 mg Oral q morning - 10a  . guaiFENesin  600 mg Oral BID  . levalbuterol  0.63 mg Nebulization Q6H  . [START ON 05/26/2013] metoprolol succinate  25 mg Oral q morning - 10a  . [START ON 05/26/2013] olopatadine  1 drop Both Eyes BID  . predniSONE  60 mg Oral BID WC  . pregabalin  150 mg Oral BID  . sodium chloride  3 mL Intravenous Q12H  . sodium chloride  3 mL Intravenous Q12H  . [START ON 05/26/2013] tamsulosin  0.4 mg Oral q morning - 10a  . [START ON 05/26/2013] tiotropium  18 mcg Inhalation q morning - 10a   Infusions:   PRN: sodium chloride,  acetaminophen, acetaminophen, levalbuterol, ondansetron (ZOFRAN) IV, ondansetron, oxyCODONE, sodium chloride  Assessment: 79 yo M with PMH of atrial fibrillation not on anticoagulation, history of chronic hemoptysis with pulmonary infiltrates of unclear etiology, COPD, obstructive sleep apnea on CPAP, who sustained an injury about 5 weeks ago, resulting in a spiral fracture of the distal fibula on the left. For VTE treatment with Heparin.  Goal of Therapy:  Heparin level 0.3-0.7 units/ml Monitor platelets by anticoagulation protocol: Yes   Plan:  Request for no load of Heparin Start heparin infusion at 1700 units/hr Continue to monitor H&H and platelets First HL due at 0200 on 11/26, then daily heparin level in AM with usual labs.  Loletta Specter 05/25/2013,7:11 PM

## 2013-05-25 NOTE — ED Provider Notes (Signed)
CSN: [id number]     Arrival date & time 05/25/13  1248 History   First MD Initiated Contact with Patient 05/25/13 1258     Chief Complaint  Patient presents with  . DVT   (Consider location/radiation/quality/duration/timing/severity/associated sxs/prior Treatment) HPI Comments: Patient brought to the emergency department from vascular lab after he was found to have a DVT. Patient had had the study ordered as an outpatient by his orthopedic specialist who is following him for a fibula fracture. Patient has had chronic swelling of the legs since an injury 7 years ago, but the swelling has worsened.  Patient denies chest pain. He has a history of chronic shortness of breath secondary to congestive heart failure, does not perceive any worsening of his breathing.   Past Medical History  Diagnosis Date  . Nonischemic cardiomyopathy     with EF 35%, s/p CRT-D device implanted  . Atrial fibrillation   . Unspecified sleep apnea   . HTN (hypertension)   . DVT (deep venous thrombosis)     recurrent. s/p IVC filter.   . Renal insufficiency   . Gout   . COPD (chronic obstructive pulmonary disease)   . CHF (congestive heart failure)    Past Surgical History  Procedure Laterality Date  . Cardiac defibrillator placement      initial BiV ICD 09/22/00 in [place].  Gen change (MDT) by Dr Amil Amen 10/30/09  . Pacemaker insertion    . Video bronchoscopy  02/26/2012    Procedure: VIDEO BRONCHOSCOPY WITHOUT FLUORO;  Surgeon: Barbaraann Share, MD;  Location: [facility];  Service: Cardiopulmonary;  Laterality: Bilateral;   Family History  Problem Relation Age of Onset  . Heart disease Mother     mother also had Rheumatism.   History  Substance Use Topics  . Smoking status: Former Smoker -- 1.50 packs/day for 30 years    Types: Cigarettes    Quit date: 07/02/1983  . Smokeless tobacco: Never Used  . Alcohol Use: No     Comment: quit after 40 years    Review of Systems  Respiratory: Positive for  shortness of breath.   Cardiovascular: Positive for leg swelling (unilateral, left side). Negative for chest pain.  All other systems reviewed and are negative.    Allergies  Review of patient's allergies indicates no known allergies.  Home Medications   Current Outpatient Rx  Name  Route  Sig  Dispense  Refill  . Ascorbic Acid (VITAMIN C) 1000 MG tablet   Oral   Take 1,000 mg by mouth every morning.          Marland Kitchen aspirin 81 MG tablet   Oral   Take 81 mg by mouth every morning.          . budesonide-formoterol (SYMBICORT) 160-4.5 MCG/ACT inhaler   Inhalation   Inhale 2 puffs into the lungs 2 (two) times daily.         . calcitRIOL (ROCALTROL) 0.5 MCG capsule   Oral   Take 0.5 mcg by mouth every other day.          . digoxin (LANOXIN) 0.125 MG tablet   Oral   Take 125 mcg by mouth every morning.          . Febuxostat (ULORIC) 80 MG TABS   Oral   Take 80 mg by mouth every morning.          . furosemide (LASIX) 40 MG tablet   Oral   Take 40 mg by mouth every  morning.          . levalbuterol (XOPENEX HFA) 45 MCG/ACT inhaler   Inhalation   Inhale 2 puffs into the lungs every 6 (six) hours as needed for wheezing.          . metoprolol succinate (TOPROL-XL) 50 MG 24 hr tablet   Oral   Take 25 mg by mouth every morning.          . Olopatadine HCl (PATADAY) 0.2 % SOLN   Both Eyes   Place 1 drop into both eyes every morning.         . pregabalin (LYRICA) 150 MG capsule   Oral   Take 150 mg by mouth 2 (two) times daily.           . Tamsulosin HCl (FLOMAX) 0.4 MG CAPS   Oral   Take 0.4 mg by mouth every morning.          . tiotropium (SPIRIVA) 18 MCG inhalation capsule   Inhalation   Place 18 mcg into inhaler and inhale every morning.           BP 130/82  Pulse 85  Temp(Src) 98 F (36.7 C) (Oral)  Resp 17  SpO2 90% Physical Exam  Constitutional: He is oriented to person, place, and time. He appears well-developed and well-nourished.  No distress.  HENT:  Head: Normocephalic and atraumatic.  Right Ear: Hearing normal.  Left Ear: Hearing normal.  Nose: Nose normal.  Mouth/Throat: Oropharynx is clear and moist and mucous membranes are normal.  Eyes: Conjunctivae and EOM are normal. Pupils are equal, round, and reactive to light.  Neck: Normal range of motion. Neck supple.  Cardiovascular: Regular rhythm, S1 normal and S2 normal.  Exam reveals no gallop and no friction rub.   No murmur heard. Pulmonary/Chest: Effort normal and breath sounds normal. No respiratory distress. He exhibits no tenderness.  Abdominal: Soft. Normal appearance and bowel sounds are normal. There is no hepatosplenomegaly. There is no tenderness. There is no rebound, no guarding, no tenderness at McBurney's point and negative Murphy's sign. No hernia.  Musculoskeletal: Normal range of motion.       Left lower leg: He exhibits swelling.  Neurological: He is alert and oriented to person, place, and time. He has normal strength. No cranial nerve deficit or sensory deficit. Coordination normal. GCS eye subscore is 4. GCS verbal subscore is 5. GCS motor subscore is 6.  Skin: Skin is warm, dry and intact. No rash noted. No cyanosis.  Psychiatric: He has a normal mood and affect. His speech is normal and behavior is normal. Thought content normal.    ED Course  Procedures (including critical care time) Labs Review Labs Reviewed  CBC WITH DIFFERENTIAL - Abnormal; Notable for the following:    RDW 15.9 (*)    Platelets 123 (*)    All other components within normal limits  BASIC METABOLIC PANEL - Abnormal; Notable for the following:    Creatinine, Ser 1.76 (*)    GFR calc non Af Amer 36 (*)    GFR calc Af Amer 41 (*)    All other components within normal limits  POCT I-STAT, CHEM 8 - Abnormal; Notable for the following:    Creatinine, Ser 1.90 (*)    All other components within normal limits  TROPONIN I  PRO B NATRIURETIC PEPTIDE   Imaging  Review Dg Chest 2 View  05/25/2013   CLINICAL DATA:  Shortness of breath.  History of hypertension  EXAM: CHEST  2 VIEW  COMPARISON:  03/10/2013  FINDINGS: The cardiac silhouette is enlarged. A left-sided chest wall AICD unit is identified with lead tips projecting region the right atrium and right ventricle. With variability in technique taking into consideration the stable bilateral interstitial prominence is identified with greatest confluence in the lung bases. No focal regions of consolidation are identified. No new focal infiltrates. There is thickening versus trace fluid along the minor fissure on the right. The osseous structures are unremarkable.  IMPRESSION: 1. Stable chronic interstitial findings likely reflecting pulmonary fibrosis. A superimposed component of pulmonary vascular congestion cannot be excluded clinically appropriate. 2. No new focal regions of consolidation or new focal infiltrates.   Electronically Signed   By: Salome Holmes M.D.   On: 05/25/2013 15:17    EKG Interpretation   None      A-V dual pacing - no further analysis   MDM   1. DVT (deep venous thrombosis), left    Patient presents to the ER for evaluation of left lower extremity DVT. He had the study performed as an outpatient. Patient was found to be hypoxic at arrival. He does have a history of O2 dependence secondary to congestive heart failure and COPD. He is not perceiving significant worsening of his shortness of breath, but with the new diagnosis of DVT, to consider etiology of hypoxia as PE. Patient has chronic renal insufficiency, cannot have a CT scan. The patient therefore will be evaluated by VQ scan.  Patient reports that he has a filter in his IVC. He's not sure why he had this placed. Records reflect that this was placed in 2007. He does not think he has ever had a blood clot, however.  Further review of the patient's records and history taking reveals that he has had significant episodes of  hemoptysis over the last 6 months to year. Prior to that he had bleeding difficulties and was felt to be a poor anticoagulation candidate for his atrial fibrillation.  Case discussed with Doctor Lowella Dandy, interventional radiology. He does not feel that there are any acute interventional procedures available to the patient at this time. Directed to the lysis would be considered if he has evidence of propagation and ongoing ischemia of the leg, such as overlying skin changes.  Patient will require hospitalization for anticoagulation under careful observation for further bleeding.  Gilda Crease, MD 05/25/13 [phone]

## 2013-05-25 NOTE — ED Notes (Signed)
Patient is on 2 LO2 due to his oxygen being low. With oxygen 92

## 2013-05-26 ENCOUNTER — Encounter (HOSPITAL_COMMUNITY): Payer: Self-pay | Admitting: *Deleted

## 2013-05-26 DIAGNOSIS — R55 Syncope and collapse: Secondary | ICD-10-CM

## 2013-05-26 DIAGNOSIS — I4891 Unspecified atrial fibrillation: Secondary | ICD-10-CM

## 2013-05-26 LAB — COMPREHENSIVE METABOLIC PANEL
ALT: 11 U/L (ref 0–53)
Alkaline Phosphatase: 97 U/L (ref 39–117)
BUN: 18 mg/dL (ref 6–23)
CO2: 30 mEq/L (ref 19–32)
Calcium: 9.5 mg/dL (ref 8.4–10.5)
Chloride: 102 mEq/L (ref 96–112)
GFR calc Af Amer: 42 mL/min — ABNORMAL LOW (ref >90)
GFR calc non Af Amer: 36 mL/min — ABNORMAL LOW (ref >90)
Glucose, Bld: 138 mg/dL — ABNORMAL HIGH (ref 70–99)
Potassium: 3.5 mEq/L (ref 3.5–5.1)
Sodium: 140 mEq/L (ref 135–145)

## 2013-05-26 LAB — HEPARIN LEVEL (UNFRACTIONATED)
Heparin Unfractionated: 0.3 IU/mL (ref 0.30–0.70)
Heparin Unfractionated: 0.56 IU/mL (ref 0.30–0.70)

## 2013-05-26 LAB — CBC
MCH: 27.5 pg (ref 26.0–34.0)
MCHC: 32.2 g/dL (ref 30.0–36.0)
RDW: 16.1 % — ABNORMAL HIGH (ref 11.5–15.5)

## 2013-05-26 MED ORDER — HEPARIN (PORCINE) IN NACL 100-0.45 UNIT/ML-% IJ SOLN
1550.0000 [IU]/h | INTRAMUSCULAR | Status: DC
  Administered 2013-05-26: 1550 [IU]/h via INTRAVENOUS
  Filled 2013-05-26 (×2): qty 250

## 2013-05-26 MED ORDER — ALUM & MAG HYDROXIDE-SIMETH 200-200-20 MG/5ML PO SUSP
30.0000 mL | Freq: Two times a day (BID) | ORAL | Status: DC | PRN
Start: 2013-05-26 — End: 2013-06-01
  Administered 2013-05-27 – 2013-05-28 (×2): 30 mL via ORAL
  Filled 2013-05-26 (×2): qty 30

## 2013-05-26 MED ORDER — WARFARIN - PHARMACIST DOSING INPATIENT: Freq: Every day | Status: DC

## 2013-05-26 MED ORDER — PANTOPRAZOLE SODIUM 40 MG PO TBEC
40.0000 mg | DELAYED_RELEASE_TABLET | Freq: Every day | ORAL | Status: DC
  Administered 2013-05-26 – 2013-06-01 (×7): 40 mg via ORAL
  Filled 2013-05-26 (×7): qty 1

## 2013-05-26 MED ORDER — HEPARIN (PORCINE) IN NACL 100-0.45 UNIT/ML-% IJ SOLN
1500.0000 [IU]/h | INTRAMUSCULAR | Status: DC
  Administered 2013-05-27: 1500 [IU]/h via INTRAVENOUS
  Filled 2013-05-26 (×2): qty 250

## 2013-05-26 MED ORDER — CHLORPROMAZINE HCL 25 MG PO TABS
25.0000 mg | ORAL_TABLET | Freq: Three times a day (TID) | ORAL | Status: DC | PRN
  Administered 2013-05-26: 22:00:00 25 mg via ORAL
  Filled 2013-05-26: qty 1

## 2013-05-26 MED ORDER — WARFARIN VIDEO
Freq: Once | Status: AC
  Administered 2013-05-27: 10:00:00

## 2013-05-26 MED ORDER — PATIENT'S GUIDE TO USING COUMADIN BOOK
Freq: Once | Status: AC
  Administered 2013-05-27: 10:00:00
  Filled 2013-05-26: qty 1

## 2013-05-26 MED ORDER — WARFARIN SODIUM 7.5 MG PO TABS
7.5000 mg | ORAL_TABLET | Freq: Once | ORAL | Status: AC
  Administered 2013-05-26: 7.5 mg via ORAL
  Filled 2013-05-26 (×2): qty 1

## 2013-05-26 NOTE — Progress Notes (Signed)
ANTICOAGULATION CONSULT NOTE   Pharmacy Consult for Heparin Indication: VTE Treatment-- LLE DVT  No Known Allergies  Patient Measurements: Height: 6\' 2"  (188 cm) Weight: 272 lb 4.3 oz (123.5 kg) IBW/kg (Calculated) : 82.2 Heparin Dosing Weight: 109kg  Vital Signs: Temp: 97.6 F (36.4 C) (11/26 0708) Temp src: Oral (11/26 0708) BP: 136/90 mmHg (11/26 0708) Pulse Rate: 73 (11/26 0708)  Labs:  Recent Labs  05/25/13 1335 05/25/13 1342 05/25/13 1827 05/26/13 0155 05/26/13 0800  HGB 13.4 15.3  --  12.8*  --   HCT 41.2 45.0  --  39.8  --   PLT 123*  --   --  120*  --   APTT  --   --  37  --   --   LABPROT  --   --  13.8  --   --   INR  --   --  1.08  --   --   HEPARINUNFRC  --   --   --  0.30 0.67  CREATININE 1.76* 1.90*  --  1.73*  --   TROPONINI <0.30  --   --   --   --     Estimated Creatinine Clearance: 49.9 ml/min (by C-G formula based on Cr of 1.73).   Medical History: Past Medical History  Diagnosis Date  . Nonischemic cardiomyopathy     with EF 35%, s/p CRT-D device implanted  . Atrial fibrillation   . Unspecified sleep apnea   . HTN (hypertension)   . DVT (deep venous thrombosis)     recurrent. s/p IVC filter.   . Renal insufficiency   . Gout   . COPD (chronic obstructive pulmonary disease)   . CHF (congestive heart failure)     Medications:  Scheduled:  . budesonide-formoterol  2 puff Inhalation BID  . calcitRIOL  0.5 mcg Oral QODAY  . digoxin  125 mcg Oral q morning - 10a  . docusate sodium  100 mg Oral BID  . febuxostat  80 mg Oral Daily  . furosemide  40 mg Oral q morning - 10a  . guaiFENesin  600 mg Oral BID  . levalbuterol  0.63 mg Nebulization Q6H  . metoprolol succinate  25 mg Oral q morning - 10a  . olopatadine  1 drop Both Eyes BID  . predniSONE  60 mg Oral BID WC  . pregabalin  150 mg Oral BID  . sodium chloride  3 mL Intravenous Q12H  . sodium chloride  3 mL Intravenous Q12H  . tamsulosin  0.4 mg Oral q morning - 10a  .  tiotropium  18 mcg Inhalation q morning - 10a   Infusions:  . heparin 1,700 Units/hr (05/25/13 2034)   PRN: sodium chloride, acetaminophen, acetaminophen, levalbuterol, ondansetron (ZOFRAN) IV, ondansetron, oxyCODONE, sodium chloride  Assessment: 79 yo M with PMH of atrial fibrillation not on anticoagulation d/t history of chronic hemoptysis with pulmonary fibrosis of unclear etiology, COPD, obstructive sleep apnea on CPAP, who sustained an injury about 5 weeks ago, resulting in a spiral fracture of the distal fibula on the left. Appears he has h/o DVT in past with IVC filter in place.  Dopplers reveal subacute DVT LLE, started on VTE treatment with Heparin. Suspect oral AC not yet started d/t possible vascular intervention.   Initial heparin level = 0.3 on heparin gtt 1700/hr, confirmatory level = 0.67  CBC: hgb = 12.8, plts = 120 (stable)  Contacted by RN 11/26am stating patient experiencing hemoptysis (this is chronic issue but  worsens with anticoagulation)  Goal of Therapy:  Heparin level 0.3-0.5 units/ml Monitor platelets by anticoagulation protocol: Yes   Plan:   Due to hemptysis, shoot for heparin level goal 0.3-0.5, decrease rate to 1550  Repeat heparin level at 6pm  Daily heparin level and CBC  Juliette Alcide, PharmD, BCPS.   Pager: [phone]  05/26/2013,9:01 AM

## 2013-05-26 NOTE — Progress Notes (Signed)
Attempted cpap with pt. But pt. sats would not maintain even with titration of the oxygen. Pt. sats remained in the 80's. RT placed pt. Back on non-rebreather and pt. sats came up to 97. RT will inform RN of situation.

## 2013-05-26 NOTE — Progress Notes (Signed)
[facility] PROGRESS NOTE  Maurice Delgado XBM:[id number] DOB: [birth year] DOA: 05/25/2013 PCP: [name], MD  Assessment/Plan: #1 Subacute left lower extremity DVT:  -Admitting M.D. discussed this complicated scenario with Dr. [name] with vascular surgery. ED physician had discussed this with the interventional radiology. At this time there is no role for local intervention to that leg. The best course of action would be to try anticoagulation.  -Also discussed patient with Dr. Walker Shadow and he agrees recommends to continue anticoagulation with heparin and to add Coumadin-it is noted that patient had some hemoptysis today but states that still this is much less than what he had in the past -Plan is to monitor patient on anticoagulation and if hemoptysis worsens then he would need to be DC -Also ultrasound ordered to assess IVC for any clot burden given high femoral involvement #2 dyspnea in the setting of COPD, and possible pulmonary fibrosis: He ran out of Symbicort 2 weeks ago. That is probably the reason for his acute decompensation.  - Dyspnea Clinically improved, continue bronchodilators and prednisone #3 history of nonischemic cardiomyopathy with EF of 20-25%: He has AICD in place. Continue with his home medications.digoxin level is 0.6. He's not an ACE inhibitor probably because of his chronic kidney disease.  -Continue beta blockers  - Continue with Lasix #4 history of atrial fibrillation: He is has a paced rhythm.  -Continue telemetry monitoring, stable #5 history of hemoptysis with pulmonary fibrosis: Patient has refused biopsies in the past. He states that the. Hemoptysis has significantly improved. However, he has noticed occasional dark clots when he coughs. It has not been significant, and it's only been once in a while. -As discussed above, appreciate pulmonology assistance #6 history of chronic kidney disease, stage III: Continue monitoring renal function closely.   -Creatinine stable #7 history of obstructive sleep apnea: Utilize CPAP while sleeping.  #8 recent left fibular fracture: Nonsurgical approach was taken. According to the wife, Dr. [name] told him that the fracture was healing quite well.  -To followup with orthopedics in the office in 3 weeks. PT/OT.  Code Status: full Family Communication: none at bedside  Disposition Plan: to home when medically stable   Consultants:  Pulmonology  Procedures:  None  Antibiotics: None HPI/Subjective: States breathing better today still with L. Leg swelling  Objective: Filed Vitals:   05/26/13 0708  BP: 136/90  Pulse: 73  Temp: 97.6 F (36.4 C)  Resp: 18    Intake/Output Summary (Last 24 hours) at 05/26/13 0907 Last data filed at 05/26/13 0801  Gross per 24 hour  Intake 653.12 ml  Output    450 ml  Net 203.12 ml   Filed Weights   05/25/13 1852  Weight: 123.5 kg (272 lb 4.3 oz)    Exam:  General: alert & oriented x 3 In NAD Cardiovascular: RRR, nl S1 s2 Respiratory: Moderate air movement, no crackles or wheezes Abdomen: soft +BS NT/ND, no masses palpable Extremities: Markedly edematous left lower extremity, no cyanosis and no edema    Data Reviewed: Basic Metabolic Panel:  Recent Labs Lab 05/25/13 1335 05/25/13 1342 05/26/13 0155  NA 140 143 140  K 4.2 3.8 3.5  CL 102 102 102  CO2 28  --  30  GLUCOSE 96 89 138*  BUN 17 18 18   CREATININE 1.76* 1.90* 1.73*  CALCIUM 10.0  --  9.5   Liver Function Tests:  Recent Labs Lab 05/26/13 0155  AST 14  ALT 11  ALKPHOS 97  BILITOT 1.1  PROT  6.7  ALBUMIN 3.1*   No results found for this basename: LIPASE, AMYLASE,  in the last 168 hours No results found for this basename: AMMONIA,  in the last 168 hours CBC:  Recent Labs Lab 05/25/13 1335 05/25/13 1342 05/26/13 0155  WBC 4.9  --  5.4  NEUTROABS 3.3  --   --   HGB 13.4 15.3 12.8*  HCT 41.2 45.0 39.8  MCV 86.0  --  85.6  PLT 123*  --  120*   Cardiac  Enzymes:  Recent Labs Lab 05/25/13 1335  TROPONINI <0.30   BNP (last 3 results)  Recent Labs  07/22/12 1656 05/25/13 1335  PROBNP 4025.0* 1513.0*   CBG: No results found for this basename: GLUCAP,  in the last 168 hours  No results found for this or any previous visit (from the past 240 hour(s)).   Studies: Dg Chest 2 View  05/25/2013   CLINICAL DATA:  Shortness of breath.  History of hypertension  EXAM: CHEST  2 VIEW  COMPARISON:  03/10/2013  FINDINGS: The cardiac silhouette is enlarged. A left-sided chest wall AICD unit is identified with lead tips projecting region the right atrium and right ventricle. With variability in technique taking into consideration the stable bilateral interstitial prominence is identified with greatest confluence in the lung bases. No focal regions of consolidation are identified. No new focal infiltrates. There is thickening versus trace fluid along the minor fissure on the right. The osseous structures are unremarkable.  IMPRESSION: 1. Stable chronic interstitial findings likely reflecting pulmonary fibrosis. A superimposed component of pulmonary vascular congestion cannot be excluded clinically appropriate. 2. No new focal regions of consolidation or new focal infiltrates.   Electronically Signed   By: Salome Holmes M.D.   On: 05/25/2013 15:17   Nm Pulmonary Perf And Vent  05/25/2013   CLINICAL DATA:  Short of breath, elevated D-dimer, COPD  EXAM: NUCLEAR MEDICINE VENTILATION - PERFUSION LUNG SCAN  TECHNIQUE: Ventilation images were obtained in multiple projections using inhaled aerosol technetium 99 M DTPA. Perfusion images were obtained in multiple projections after intravenous injection of Tc-63m MAA.  COMPARISON:  Chest radiograph 05/25/2013, CT thorax 10/27/2012  RADIOPHARMACEUTICALS:  Forty-five mCi Tc-22m DTPA aerosol and 5.2 mCi Tc-38m MAA  FINDINGS: Ventilation: There are linear ventilation defects the left and right lung elated to chronic  obstructive pulmonary disease. There is a bandlike defect in the right mid lung.  Perfusion: There no wedge-shaped peripheral perfusion defects. Bandlike perfusion defect in the right mid lung matches the ventilation defect.  IMPRESSION: Very low probability for acute pulmonary embolism.  Ventilation pattern consistent with COPD.   Electronically Signed   By: Genevive Bi M.D.   On: 05/25/2013 16:31    Scheduled Meds: . budesonide-formoterol  2 puff Inhalation BID  . calcitRIOL  0.5 mcg Oral QODAY  . digoxin  125 mcg Oral q morning - 10a  . docusate sodium  100 mg Oral BID  . febuxostat  80 mg Oral Daily  . furosemide  40 mg Oral q morning - 10a  . guaiFENesin  600 mg Oral BID  . levalbuterol  0.63 mg Nebulization Q6H  . metoprolol succinate  25 mg Oral q morning - 10a  . olopatadine  1 drop Both Eyes BID  . predniSONE  60 mg Oral BID WC  . pregabalin  150 mg Oral BID  . sodium chloride  3 mL Intravenous Q12H  . sodium chloride  3 mL Intravenous Q12H  . tamsulosin  0.4 mg Oral q morning - 10a  . tiotropium  18 mcg Inhalation q morning - 10a   Continuous Infusions: . heparin 1,700 Units/hr (05/25/13 2034)    Principal Problem:   Lower leg DVT (deep venous thromboembolism), acute Active Problems:   OBSTRUCTIVE SLEEP APNEA   COPD (chronic obstructive pulmonary disease)   Chronic systolic dysfunction of left ventricle   Atrial fibrillation   Hemoptysis, unspecified   Pulmonary infiltrates   Dyspnea   CKD (chronic kidney disease) stage 3, GFR 30-59 ml/min   S/P IVC filter   Acute DVT (deep venous thrombosis)    Time spent: 35    Jolena Kittle C  [facility] Pager [phone]. If 7PM-7AM, please contact night-coverage at [email or web address], password [facility] 05/26/2013, 9:07 AM  LOS: 1 day

## 2013-05-26 NOTE — Progress Notes (Signed)
Pt c/o hiccups called midlevel awaiting call back.

## 2013-05-26 NOTE — Progress Notes (Signed)
Spoke with Darl Pikes, RN. Requested patient be NPO overnight for duplex study in AM (11/27).     Farrel Demark, RDMS, RVT

## 2013-05-26 NOTE — Progress Notes (Signed)
Rx Brief Anticoagulation note:  IV Heparin  Assessement:  HL= 0.30 units/hr after 1700 units/hr x 5.5 hrs ( at goal 0.3-0.7) No bolus.  No IV interuptions or new bleeding per RN. (pt with chronic hemoptysis)  Plan:  Continue heparin drip @ 1700 units/hr  Recheck @ 0800  Maurice Delgado 05/26/2013 3:12 AM

## 2013-05-26 NOTE — Progress Notes (Signed)
Patient on nonrebreather at start of shift.  Once patient's breakfast came, switched patient to [place] at 6 L/M and his oxygen saturation remained above 90%.  Keeping patient on [place] for now and will slowly decrease levels as tolerated.

## 2013-05-26 NOTE — Progress Notes (Signed)
ANTICOAGULATION CONSULT NOTE - Follow Up Consult  Pharmacy Consult for:  IV Heparin, Coumadin Indication:  VTE treatment -- LLE DVT  No Known Allergies  Patient Measurements: Height: 6\' 2"  (188 cm) Weight: 274 lb 12.8 oz (124.648 kg) IBW/kg (Calculated) : 82.2 Heparin Dosing Weight: 109 kg  Vital Signs: Temp: 97.7 F (36.5 C) (11/26 1449) Temp src: Oral (11/26 1449) BP: 137/77 mmHg (11/26 1449) Pulse Rate: 64 (11/26 1449)  Labs:  Recent Labs  05/25/13 1335 05/25/13 1342 05/25/13 1827 05/26/13 0155 05/26/13 0800 05/26/13 1809  HGB 13.4 15.3  --  12.8*  --   --   HCT 41.2 45.0  --  39.8  --   --   PLT 123*  --   --  120*  --   --   APTT  --   --  37  --   --   --   LABPROT  --   --  13.8  --   --   --   INR  --   --  1.08  --   --   --   HEPARINUNFRC  --   --   --  0.30 0.67 0.56  CREATININE 1.76* 1.90*  --  1.73*  --   --   TROPONINI <0.30  --   --   --   --   --     Estimated Creatinine Clearance: 50.2 ml/min (by C-G formula based on Cr of 1.73).   Medications:  Scheduled:  . budesonide-formoterol  2 puff Inhalation BID  . calcitRIOL  0.5 mcg Oral QODAY  . digoxin  125 mcg Oral q morning - 10a  . docusate sodium  100 mg Oral BID  . febuxostat  80 mg Oral Daily  . furosemide  40 mg Oral q morning - 10a  . guaiFENesin  600 mg Oral BID  . levalbuterol  0.63 mg Nebulization Q6H  . metoprolol succinate  25 mg Oral q morning - 10a  . olopatadine  1 drop Both Eyes BID  . pantoprazole  40 mg Oral Daily  . predniSONE  60 mg Oral BID WC  . pregabalin  150 mg Oral BID  . sodium chloride  3 mL Intravenous Q12H  . sodium chloride  3 mL Intravenous Q12H  . tamsulosin  0.4 mg Oral q morning - 10a  . tiotropium  18 mcg Inhalation q morning - 10a    Assessment:  With Heparin infusing at 1550 units/hr, the Heparin level drawn at 18:09 was reported as 0.56 units/ml.  This level is slightly above the desired range, 0.3-0.5 units/ml.    History of chronic hemoptysis,  but RN reports none seen so far today.  Starting Coumadin today.  The baseline PT/INR drawn on 05/25/13 was 13.8/1.08, in the normal range.  Thrombocytopenia - platelets 120K   Goals of Therapy:   Heparin level 0.3-0.5 units/ml  Monitor platelets by anticoagulation protocol: Yes  INR 2-3   Plan:   Decrease the Heparin infusion rate to 1500 units/hr  Continue daily Heparin level and CBC.  Give Coumadin 7.5 mg tonight.  Follow the PT/INR daily.  Polo Riley R.Ph. 05/26/2013 7:34 PM     ,

## 2013-05-26 NOTE — Progress Notes (Signed)
Pharmacist Heart Failure Core Measure Documentation  Assessment: Maurice Delgado has an EF documented as 20-25% on 07/24/12 by ECHO.  Rationale: Heart failure patients with left ventricular systolic dysfunction (LVSD) and an EF < 40% should be prescribed an angiotensin converting enzyme inhibitor (ACEI) or angiotensin receptor blocker (ARB) at discharge unless a contraindication is documented in the medical record.  This patient is not currently on an ACEI or ARB for HF.  This note is being placed in the record in order to provide documentation that a contraindication to the use of these agents is present for this encounter.  ACE Inhibitor or Angiotensin Receptor Blocker is contraindicated (specify all that apply)  []   ACEI allergy AND ARB allergy []   Angioedema []   Moderate or severe aortic stenosis []   Hyperkalemia []   Hypotension []   Renal artery stenosis [x]   Worsening renal function, preexisting renal disease or dysfunction   Dannielle Huh 05/26/2013 2:24 PM

## 2013-05-26 NOTE — Progress Notes (Signed)
OT Cancellation Note  Patient Details Name: Maurice Delgado MRN: [id number] DOB: 05/06/1934   Cancelled Treatment:    Reason Eval/Treat Not Completed: OT screened, no needs identified, will sign off.  Noted pt was admitted with DVT.  He was mod I with PT with transfers and feels he can still perform adls at mod I level.  He has a high toilet and has been keeping weight off his leg for 5+ weeks.  Pt uses 02, 24/7.  Will sign off.  Dezzie Badilla 05/26/2013, 4:04 PM Marica Otter, OTR/L [phone] 05/26/2013

## 2013-05-26 NOTE — Consult Note (Signed)
PULMONARY  / CRITICAL CARE MEDICINE  Name: Maurice Delgado MRN: [id number] DOB: 08/01/1933    ADMISSION DATE:  05/25/2013 CONSULTATION DATE:  05/26/13  REFERRING MD :  Dr. Rito Ehrlich PRIMARY SERVICE:  TRH  CHIEF COMPLAINT:  Hemoptysis  BRIEF PATIENT DESCRIPTION: 79 y/o M with COPD, ILD, OSA on CPAP and recent L fibula fracture requiring a boot for medical management who was admitted with DVT.   SIGNIFICANT EVENTS / STUDIES:  11/25 - Admit with DVT LLE   HISTORY OF PRESENT ILLNESS:  79 y/o M with COPD, ILD, OSA on CPAP, Recurrent DVT s/p IVC filter and recent L fibula fracture requiring a boot for medical management who was admitted with DVT - subacute vs chronic DVT of L femoral vein, left profunda femoris vein and L popliteal.  Patient placed on heparin gtt for DVT.  He reports swelling of lower extremities since his IVC filter placement in 2007 (done in [place]).  Worsened swelling of LLE in the weeks since his fracture. Patient notes his chronic hemoptysis has been decreased in the last 6 months prior to admit.  Minimal cough.  He has occasional episodes of bloody or dark sputum usually early in the am.  Frequency and volume have decreased over past 6 months.  He relays episodes of increased wheezing in the days prior to episodes of hemoptysis.  Patient was admitted after positive LE doppler for DVT.  PCCM consulted for input on chronic hemoptysis and anticoagulation.    Currently denies chest pain, shortness of breath, pain with inspiration, fevers, chills, n/v/d, headaches, visual changes.   PAST MEDICAL HISTORY :  Past Medical History  Diagnosis Date  . Nonischemic cardiomyopathy     with EF 35%, s/p CRT-D device implanted  . Atrial fibrillation   . Unspecified sleep apnea   . HTN (hypertension)   . DVT (deep venous thrombosis)     recurrent. s/p IVC filter.   . Renal insufficiency   . Gout   . COPD (chronic obstructive pulmonary disease)   . CHF (congestive heart failure)     Past Surgical History  Procedure Laterality Date  . Cardiac defibrillator placement      initial BiV ICD 09/22/00 in [place].  Gen change (MDT) by Dr Amil Amen 10/30/09  . Pacemaker insertion    . Video bronchoscopy  02/26/2012    Procedure: VIDEO BRONCHOSCOPY WITHOUT FLUORO;  Surgeon: Barbaraann Share, MD;  Location: [facility];  Service: Cardiopulmonary;  Laterality: Bilateral;  . Foot surgery Right    Prior to Admission medications   Medication Sig Start Date End Date Taking? Authorizing Provider  Ascorbic Acid (VITAMIN C) 1000 MG tablet Take 1,000 mg by mouth every morning.    Yes Historical Provider, MD  aspirin 81 MG tablet Take 81 mg by mouth every morning.    Yes Historical Provider, MD  budesonide-formoterol (SYMBICORT) 160-4.5 MCG/ACT inhaler Inhale 2 puffs into the lungs 2 (two) times daily.   Yes Historical Provider, MD  calcitRIOL (ROCALTROL) 0.5 MCG capsule Take 0.5 mcg by mouth every other day.    Yes Historical Provider, MD  digoxin (LANOXIN) 0.125 MG tablet Take 125 mcg by mouth every morning.    Yes Historical Provider, MD  Febuxostat (ULORIC) 80 MG TABS Take 80 mg by mouth every morning.    Yes Historical Provider, MD  furosemide (LASIX) 40 MG tablet Take 40 mg by mouth every morning.    Yes Historical Provider, MD  levalbuterol [facility] HFA) 45 MCG/ACT inhaler Inhale 2 puffs  into the lungs every 6 (six) hours as needed for wheezing.    Yes Historical Provider, MD  metoprolol succinate (TOPROL-XL) 50 MG 24 hr tablet Take 25 mg by mouth every morning.    Yes Historical Provider, MD  Olopatadine HCl (PATADAY) 0.2 % SOLN Place 1 drop into both eyes every morning.   Yes Historical Provider, MD  pregabalin (LYRICA) 150 MG capsule Take 150 mg by mouth 2 (two) times daily.     Yes Historical Provider, MD  Tamsulosin HCl (FLOMAX) 0.4 MG CAPS Take 0.4 mg by mouth every morning.    Yes Historical Provider, MD  tiotropium (SPIRIVA) 18 MCG inhalation capsule Place 18 mcg into inhaler and  inhale every morning.    Yes Historical Provider, MD   No Known Allergies  FAMILY HISTORY:  Family History  Problem Relation Age of Onset  . Heart disease Mother     mother also had Rheumatism.   SOCIAL HISTORY:  reports that he quit smoking about 29 years ago. His smoking use included Cigarettes. He has a 45 pack-year smoking history. He has never used smokeless tobacco. He reports that he does not drink alcohol or use illicit drugs.  REVIEW OF SYSTEMS:   Constitutional: Negative for fever, chills, weight loss, malaise/fatigue and diaphoresis.  HENT: Negative for hearing loss, ear pain, nosebleeds, congestion, sore throat, neck pain, tinnitus and ear discharge.   Eyes: Negative for blurred vision, double vision, photophobia, pain, discharge and redness.  Respiratory: SEE HPI Cardiovascular: Negative for chest pain, palpitations, orthopnea, claudication,and PND. SEE HPI Gastrointestinal: Negative for heartburn, nausea, vomiting, abdominal pain, diarrhea, constipation, blood in stool and melena.  Genitourinary: Negative for dysuria, urgency, frequency, hematuria and flank pain.  Musculoskeletal: Negative for myalgias, back pain, joint pain and falls.  Skin: Negative for itching and rash.  Neurological: Negative for dizziness, tingling, tremors, sensory change, speech change, focal weakness, seizures, loss of consciousness, weakness and headaches.  Endo/Heme/Allergies: Negative for environmental allergies and polydipsia. Does not bruise/bleed easily.  SUBJECTIVE:   VITAL SIGNS: Temp:  [97.5 F (36.4 C)-98 F (36.7 C)] 97.6 F (36.4 C) (11/26 0708) Pulse Rate:  [64-85] 73 (11/26 0708) Resp:  [16-18] 18 (11/26 0708) BP: (125-150)/(81-106) 136/90 mmHg (11/26 0708) SpO2:  [78 %-100 %] 96 % (11/26 0914) Weight:  [272 lb 4.3 oz (123.5 kg)-274 lb 12.8 oz (124.648 kg)] 274 lb 12.8 oz (124.648 kg) (11/26 0914)  PHYSICAL EXAMINATION: General:  wdwn male in NAD Neuro:  AAOx4, speech  clear, MAE HEENT:  Mm pink/moist, no jvd, long beard Cardiovascular:  s1s2 rrr, no m/r/g Lungs:  resp's even/non-labored, lungs bilaterally with few lower posterior crackles Abdomen:  Round/soft, bsx4 active, tol PO's Musculoskeletal:  No acute deformities Skin:  Warm/dry, LLE > RLE edema 2+   Recent Labs Lab 05/25/13 1335 05/25/13 1342 05/26/13 0155  NA 140 143 140  K 4.2 3.8 3.5  CL 102 102 102  CO2 28  --  30  BUN 17 18 18   CREATININE 1.76* 1.90* 1.73*  GLUCOSE 96 89 138*    Recent Labs Lab 05/25/13 1335 05/25/13 1342 05/26/13 0155  HGB 13.4 15.3 12.8*  HCT 41.2 45.0 39.8  WBC 4.9  --  5.4  PLT 123*  --  120*   Dg Chest 2 View  05/25/2013   CLINICAL DATA:  Shortness of breath.  History of hypertension  EXAM: CHEST  2 VIEW  COMPARISON:  03/10/2013  FINDINGS: The cardiac silhouette is enlarged. A left-sided chest wall AICD  unit is identified with lead tips projecting region the right atrium and right ventricle. With variability in technique taking into consideration the stable bilateral interstitial prominence is identified with greatest confluence in the lung bases. No focal regions of consolidation are identified. No new focal infiltrates. There is thickening versus trace fluid along the minor fissure on the right. The osseous structures are unremarkable.  IMPRESSION: 1. Stable chronic interstitial findings likely reflecting pulmonary fibrosis. A superimposed component of pulmonary vascular congestion cannot be excluded clinically appropriate. 2. No new focal regions of consolidation or new focal infiltrates.   Electronically Signed   By: Salome Holmes M.D.   On: 05/25/2013 15:17   Nm Pulmonary Perf And Vent  05/25/2013   CLINICAL DATA:  Short of breath, elevated D-dimer, COPD  EXAM: NUCLEAR MEDICINE VENTILATION - PERFUSION LUNG SCAN  TECHNIQUE: Ventilation images were obtained in multiple projections using inhaled aerosol technetium 99 M DTPA. Perfusion images were  obtained in multiple projections after intravenous injection of Tc-23m MAA.  COMPARISON:  Chest radiograph 05/25/2013, CT thorax 10/27/2012  RADIOPHARMACEUTICALS:  Forty-five mCi Tc-87m DTPA aerosol and 5.2 mCi Tc-88m MAA  FINDINGS: Ventilation: There are linear ventilation defects the left and right lung elated to chronic obstructive pulmonary disease. There is a bandlike defect in the right mid lung.  Perfusion: There no wedge-shaped peripheral perfusion defects. Bandlike perfusion defect in the right mid lung matches the ventilation defect.  IMPRESSION: Very low probability for acute pulmonary embolism.  Ventilation pattern consistent with COPD.   Electronically Signed   By: Genevive Bi M.D.   On: 05/25/2013 16:31    ASSESSMENT / PLAN:  Hemoptysis - ongoing, followed by Dr. Shelle Iron, has been followed for past year + for hemoptysis. Initially, biopsy was considered for vasculitis with new infiltrate but held off at patients request.  Continues to be intermittent and small volume of varying age (some fresh, some old).  Reports hemoptysis at lowest level in months Has history of chronic DVT and has IVC filter in place.   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 S/P VQ scan with low probability of PE.  Plan: -continue heparin with addition of coumadin bridge overlap -monitor for changes in volume of hemoptysis.  If increases may have to consider discontinuation anticoagulation with IVC filter only.  At this point, patient willing to accept the risk / benefit of anticoagulation.  Has been on coumadin in the past and did not tolerate.  -assess IVC with venous duplex to ensure no clot burden given high femoral involvement of clot burden (rising sr cr), r/o renal vein involvement -CT imaging of abd will be deferred due to contrast.  -Dr. Shelle Iron  updated on plan of care -benefit anticoagulation success attempt outwt risk at this time, this may change --would keep him in hospital extra 48 hrs after there coumadin to evaluate for hemoptysis  Will sign off, call if any concerns arise, hemoptysis etc  Canary Brim, NP-C [facility] Pgr: [phone] or [phone]  05/26/2013, 10:42 AM  I have fully examined this patient and agree with above findings.    And edited infull  D/w Dr Shelle Iron, updated him on plan  Mcarthur Rossetti. Tyson Alias, MD, FACP Pgr: [phone] [facility]

## 2013-05-26 NOTE — Evaluation (Signed)
Physical Therapy Evaluation Patient Details Name: Maurice Delgado MRN: [id number] DOB: [birth year] Today's Date: 05/26/2013 Time: 7253-6644 PT Time Calculation (min): 20 min  PT Assessment / Plan / Recommendation History of Present Illness  DVT L LEG< recent non surgical fibula fx, wears fx boot.  Clinical Impression  Pt mobilizing from chair to bed with a squat pivot. Pt reports using 4 wheeled RW and gets around first level of his home. Pt did not know weight bearing restriction per Dr. [name]. Stated" He told me not to put weight on it." Pt is functioning near his baseline. Pt will benefit from PT to address problems listed.     PT Assessment  Patient needs continued PT services    Follow Up Recommendations  No PT follow up    Does the patient have the potential to tolerate intense rehabilitation      Barriers to Discharge        Equipment Recommendations  None recommended by PT    Recommendations for Other Services     Frequency Min 3X/week    Precautions / Restrictions Precautions Precautions: Fall Required Braces or Orthoses: Other Brace/Splint Other Brace/Splint: has Cam/fx boot in room but not on at present. Restrictions Other Position/Activity Restrictions: Pt reports Dr. [name] did not want pt putting too much weight on L leg,Did not knw WBS.   Pertinent Vitals/Pain Pt on RA when PT entered. Noted incr. Dyspnea after transfer on RA Sats 83% Replaced O2 at 5 l. Sats increased too 90's      Mobility  Bed Mobility Bed Mobility: Sit to Supine Sit to Supine: 7: Independent Transfers Transfers: Heritage manager Transfers: 6: Modified independent (Device/Increase time) Details for Transfer Assistance: pt moved from chair to his bed with squat pivot.Pt did not have CAM boot on. Ambulation/Gait Ambulation/Gait Assistance: Not tested (comment)    Exercises     PT Diagnosis: Difficulty walking;Generalized weakness  PT Problem List: Decreased  activity tolerance;Cardiopulmonary status limiting activity PT Treatment Interventions: DME instruction;Gait training;Functional mobility training;Therapeutic activities;Therapeutic exercise;Patient/family education     PT Goals(Current goals can be found in the care plan section) Acute Rehab PT Goals Patient Stated Goal: I want to go home soon PT Goal Formulation: With patient Time For Goal Achievement: 06/09/13 Potential to Achieve Goals: Good  Visit Information  Last PT Received On: 05/26/13 Assistance Needed: +1 History of Present Illness: DVT L LEG< recent non surgical fibula fx, wears fx boot.       Prior Functioning  Home Living Family/patient expects to be discharged to:: Private residence Living Arrangements: Spouse/significant other Available Help at Discharge: Family;Available PRN/intermittently Type of Home: House Home Access: Level entry Home Layout: Two level;1/2 bath on main level Home Equipment: Walker - 4 wheels;Cane - single point Prior Function Level of Independence: Needs assistance Gait / Transfers Assistance Needed: gets around on his 4 wheeled RW, does not go up the steps to 2nd floor.    Cognition  Cognition Arousal/Alertness: Awake/alert Behavior During Therapy: WFL for tasks assessed/performed Overall Cognitive Status: Within Functional Limits for tasks assessed    Extremity/Trunk Assessment Upper Extremity Assessment Upper Extremity Assessment: Overall WFL for tasks assessed Lower Extremity Assessment Lower Extremity Assessment: LLE deficits/detail LLE Deficits / Details: able to lift legs onto bed,   Balance    End of Session PT - End of Session Activity Tolerance: Patient tolerated treatment well Patient left: in bed;with call bell/phone within reach Nurse Communication: Mobility status  GP     Shailyn Weyandt,  Jobe Igo 05/26/2013, 2:46 PM Blanchard Kelch PT [phone]

## 2013-05-27 DIAGNOSIS — J441 Chronic obstructive pulmonary disease with (acute) exacerbation: Secondary | ICD-10-CM

## 2013-05-27 DIAGNOSIS — G4733 Obstructive sleep apnea (adult) (pediatric): Secondary | ICD-10-CM

## 2013-05-27 DIAGNOSIS — I10 Essential (primary) hypertension: Secondary | ICD-10-CM

## 2013-05-27 DIAGNOSIS — N183 Chronic kidney disease, stage 3 (moderate): Secondary | ICD-10-CM

## 2013-05-27 LAB — CBC
HCT: 39.5 % (ref 39.0–52.0)
Hemoglobin: 12.7 g/dL — ABNORMAL LOW (ref 13.0–17.0)
MCHC: 32.2 g/dL (ref 30.0–36.0)
RDW: 16 % — ABNORMAL HIGH (ref 11.5–15.5)
WBC: 8.4 10*3/uL (ref 4.0–10.5)

## 2013-05-27 LAB — HEPARIN LEVEL (UNFRACTIONATED): Heparin Unfractionated: 0.55 IU/mL (ref 0.30–0.70)

## 2013-05-27 LAB — PROTIME-INR
INR: 1.09 (ref 0.00–1.49)
Prothrombin Time: 13.9 seconds (ref 11.6–15.2)

## 2013-05-27 MED ORDER — CALCIUM CARBONATE ANTACID 500 MG PO CHEW
400.0000 mg | CHEWABLE_TABLET | Freq: Three times a day (TID) | ORAL | Status: DC | PRN
  Administered 2013-05-27: 400 mg via ORAL
  Filled 2013-05-27 (×2): qty 2

## 2013-05-27 MED ORDER — HEPARIN (PORCINE) IN NACL 100-0.45 UNIT/ML-% IJ SOLN
1100.0000 [IU]/h | INTRAMUSCULAR | Status: DC
  Administered 2013-05-27: 19:00:00 1450 [IU]/h via INTRAVENOUS
  Filled 2013-05-27 (×3): qty 250

## 2013-05-27 MED ORDER — WARFARIN SODIUM 7.5 MG PO TABS
7.5000 mg | ORAL_TABLET | Freq: Once | ORAL | Status: AC
  Administered 2013-05-27: 7.5 mg via ORAL
  Filled 2013-05-27: qty 1

## 2013-05-27 NOTE — Progress Notes (Signed)
ANTICOAGULATION CONSULT NOTE   Pharmacy Consult for Heparin/Warfarin Indication:  LLE DVT  No Known Allergies  Patient Measurements: Height: 6\' 2"  (188 cm) Weight: 274 lb 11.3 oz (124.605 kg) IBW/kg (Calculated) : 82.2 Heparin Dosing Weight: 109kg  Vital Signs: Temp: 97.5 F (36.4 C) (11/27 0610) Temp src: Oral (11/27 0610) BP: 148/88 mmHg (11/27 0610) Pulse Rate: 63 (11/27 0610)  Labs:  Recent Labs  05/25/13 1335 05/25/13 1342 05/25/13 1827  05/26/13 0155 05/26/13 0800 05/26/13 1809 05/27/13 0421  HGB 13.4 15.3  --   --  12.8*  --   --  12.7*  HCT 41.2 45.0  --   --  39.8  --   --  39.5  PLT 123*  --   --   --  120*  --   --  127*  APTT  --   --  37  --   --   --   --   --   LABPROT  --   --  13.8  --   --   --   --  13.9  INR  --   --  1.08  --   --   --   --  1.09  HEPARINUNFRC  --   --   --   < > 0.30 0.67 0.56 0.55  CREATININE 1.76* 1.90*  --   --  1.73*  --   --   --   TROPONINI <0.30  --   --   --   --   --   --   --   < > = values in this interval not displayed.  Estimated Creatinine Clearance: 50.2 ml/min (by C-G formula based on Cr of 1.73).   Medical History: Past Medical History  Diagnosis Date  . Nonischemic cardiomyopathy     with EF 35%, s/p CRT-D device implanted  . Atrial fibrillation   . Unspecified sleep apnea   . HTN (hypertension)   . DVT (deep venous thrombosis)     recurrent. s/p IVC filter.   . Renal insufficiency   . Gout   . COPD (chronic obstructive pulmonary disease)   . CHF (congestive heart failure)     Medications:  Scheduled:  . budesonide-formoterol  2 puff Inhalation BID  . calcitRIOL  0.5 mcg Oral QODAY  . digoxin  125 mcg Oral q morning - 10a  . docusate sodium  100 mg Oral BID  . febuxostat  80 mg Oral Daily  . furosemide  40 mg Oral q morning - 10a  . guaiFENesin  600 mg Oral BID  . levalbuterol  0.63 mg Nebulization Q6H  . metoprolol succinate  25 mg Oral q morning - 10a  . olopatadine  1 drop Both Eyes BID   . pantoprazole  40 mg Oral Daily  . patient's guide to using coumadin book   Does not apply Once  . predniSONE  60 mg Oral BID WC  . pregabalin  150 mg Oral BID  . sodium chloride  3 mL Intravenous Q12H  . sodium chloride  3 mL Intravenous Q12H  . tamsulosin  0.4 mg Oral q morning - 10a  . tiotropium  18 mcg Inhalation q morning - 10a  . warfarin   Does not apply Once  . Warfarin - Pharmacist Dosing Inpatient   Does not apply q1800   Infusions:  . heparin 1,500 Units/hr (05/27/13 0128)   PRN: sodium chloride, acetaminophen, acetaminophen, alum & mag hydroxide-simeth, calcium carbonate, chlorproMAZINE,  levalbuterol, ondansetron (ZOFRAN) IV, ondansetron, oxyCODONE, sodium chloride  Assessment: 79 yo M with PMH of atrial fibrillation not on anticoagulation d/t history of chronic hemoptysis with pulmonary fibrosis of unclear etiology, COPD, obstructive sleep apnea on CPAP, who sustained an injury about 5 weeks ago, resulting in a spiral fracture of the distal fibula on the left. Appears he has h/o DVT in past with IVC filter in place.  Dopplers reveal subacute DVT LLE, started on VTE treatment with Heparin. Suspect oral AC not yet started d/t possible vascular intervention.   Initial heparin level = 0.55 on 1500 units/hr  CBC: hgb = 12.7 stable, plts = 127 (stable)  Contacted by RN 11/26am stating patient experiencing hemoptysis (this is chronic issue but worsens with anticoagulation) - Monitoring closely   Monitoring for changes in volume of hemoptysis, per CCM, if increases may have to consider discontinuation of anticoagulation with IVC filter only.  At this point the patient is willing to accept the risk/benefit of anticoagulation.  He states he has been on coumadin in the past and did not tolerate but is willing to try again. *Will f/u what dose he was taking in the past*   Warfarin started on 11/26, 7.5 mg x 1 given.  INR remains at baseline (1.09) today.  No further bleeding issues  reported   Goal of Therapy:  Heparin level 0.3-0.5 units/ml Monitor platelets by anticoagulation protocol: Yes   Plan:   Due to hemptysis, shoot for heparin level goal 0.3-0.5, decrease rate to 1450 units/hr  Daily heparin level and CBC  Coumadin 7.5 mg PO at 1800 tonight (suggested dose 10 mg but reduced secondary to age and risk of bleeding)  Daily PT/INR  Monitor for hemoptysis   F/u previous warfarin dose patient was taking   Ewell Benassi, Loma Messing PharmD Pager #: [phone] 7:31 AM 05/27/2013

## 2013-05-27 NOTE — Progress Notes (Signed)
Spoke with pt regarding cpap for rest tonight.  Pt stated he does not like the fit of the mask and does not want to wear it tonight.  RT explained that he is setup with a large mask and that unfortunately we don't have xl masks available at this time.  Pt was advised that he may have someone bring in his home cpap mask/tubing for tomorrow night if he wishes.  RT encouraged pt to let his nurse know if he changes his mind about trying cpap again tonight.

## 2013-05-27 NOTE — Progress Notes (Signed)
Pt still with hiccups called midlevel and awaiting call back.

## 2013-05-27 NOTE — Progress Notes (Signed)
Placed pt on auto cpap and RN was notified. Sterile water was added for humidification and 5 lpm of oxygen was bled in. Pt is comfortable and SpO2 was 96%. RT will continue to monitor.

## 2013-05-27 NOTE — Progress Notes (Signed)
VASCULAR LAB PRELIMINARY  PRELIMINARY  PRELIMINARY  PRELIMINARY  Renal vein duplex completed.    Preliminary report:  Right:  Minimal flow noted in the kidney.  Minimal renal artery or vein color noted.  Minimal flow noted.  Left:  Color flow noted in renal vein.    Note:  This study is unable to completely rule out non occlusive renal vein thrombus.    Maximillion Gill, RVT 05/27/2013, 11:39 AM

## 2013-05-27 NOTE — Progress Notes (Signed)
[facility] PROGRESS NOTE  Maurice Delgado AVW:[id number] DOB: May 17, 1934 DOA: 05/25/2013 PCP: [name], MD  Assessment/Plan: #1 Subacute left lower extremity DVT:  -Admitting M.D. discussed this complicated scenario with Dr. [name] with vascular surgery. ED physician had discussed this with the interventional radiology. At this time there is no role for local intervention to that leg. The best course of action would be to try anticoagulation.  -Also discussed patient with Dr. [name] on 11/26 and he agrees recommends to continue anticoagulation with heparin and to add Coumadin- patient continue to have hemoptysis but states that still this is much less than what he had in the past -Plan is to monitor patient on anticoagulation and if hemoptysis worsens then he would need to be DC - ultrasound of renal duplex unable to completely rule out non-occlusive renal vein thrombosis -Will continue anticoagulation with Coumadin/heparin as per above plan, hemoptysis is not any worse at this time per patient report and his hemoglobin is remaining -INR is still subtherapeutic at 1.09 today #2 dyspnea in the setting of COPD, and possible pulmonary fibrosis: He ran out of Symbicort 2 weeks ago. That is probably the reason for his acute decompensation.  - Dyspnea Clinically improved, continue bronchodilators and prednisone #3 history of nonischemic cardiomyopathy with EF of 20-25%: He has AICD in place. Continue with his home medications.digoxin level is 0.6. He's not an ACE inhibitor probably because of his chronic kidney disease.  -Continue beta blockers  - Continue with Lasix #4 history of atrial fibrillation: He is has a paced rhythm.  -Continue telemetry monitoring, stable #5 history of hemoptysis with pulmonary fibrosis: Patient has refused biopsies in the past. He states that the. Hemoptysis has significantly improved. However, he has noticed occasional dark clots when he coughs.   -Continue monitoring, the patient hemoptysis not worsening at this time and hemoglobin stable -As discussed above, appreciate pulmonology assistance #6 history of chronic kidney disease, stage III: Continue monitoring renal function closely.  -Creatinine stable #7 history of obstructive sleep apnea: Utilize CPAP while sleeping.  #8 recent left fibular fracture: Nonsurgical approach was taken. According to the wife, Dr. [name] told him that the fracture was healing quite well.  -To followup with orthopedics in the office in 3 weeks. PT/OT.  Code Status: full Family Communication: none at bedside  Disposition Plan: to home when medically stable   Consultants:  Pulmonology  Procedures:  None  Antibiotics: None HPI/Subjective: Still with hemoptysis today but states now worse than has been in the past. Denies shortness of breath  Objective: Filed Vitals:   05/27/13 0610  BP: 148/88  Pulse: 63  Temp: 97.5 F (36.4 C)  Resp: 18    Intake/Output Summary (Last 24 hours) at 05/27/13 1409 Last data filed at 05/27/13 1349  Gross per 24 hour  Intake 1228.16 ml  Output   2100 ml  Net -871.84 ml   Filed Weights   05/25/13 1852 05/26/13 0914 05/27/13 0610  Weight: 123.5 kg (272 lb 4.3 oz) 124.648 kg (274 lb 12.8 oz) 124.605 kg (274 lb 11.3 oz)    Exam:  General: alert & oriented x 3 In NAD Cardiovascular: RRR, nl S1 s2 Respiratory: Moderate air movement, no crackles or wheezes Abdomen: soft +BS NT/ND, no masses palpable Extremities: Markedly edematous left lower extremity, no cyanosis and no edema    Data Reviewed: Basic Metabolic Panel:  Recent Labs Lab 05/25/13 1335 05/25/13 1342 05/26/13 0155  NA 140 143 140  K 4.2 3.8 3.5  CL 102  102 102  CO2 28  --  30  GLUCOSE 96 89 138*  BUN 17 18 18   CREATININE 1.76* 1.90* 1.73*  CALCIUM 10.0  --  9.5   Liver Function Tests:  Recent Labs Lab 05/26/13 0155  AST 14  ALT 11  ALKPHOS 97  BILITOT 1.1  PROT 6.7   ALBUMIN 3.1*   No results found for this basename: LIPASE, AMYLASE,  in the last 168 hours No results found for this basename: AMMONIA,  in the last 168 hours CBC:  Recent Labs Lab 05/25/13 1335 05/25/13 1342 05/26/13 0155 05/27/13 0421  WBC 4.9  --  5.4 8.4  NEUTROABS 3.3  --   --   --   HGB 13.4 15.3 12.8* 12.7*  HCT 41.2 45.0 39.8 39.5  MCV 86.0  --  85.6 84.9  PLT 123*  --  120* 127*   Cardiac Enzymes:  Recent Labs Lab 05/25/13 1335  TROPONINI <0.30   BNP (last 3 results)  Recent Labs  07/22/12 1656 05/25/13 1335  PROBNP 4025.0* 1513.0*   CBG: No results found for this basename: GLUCAP,  in the last 168 hours  No results found for this or any previous visit (from the past 240 hour(s)).   Studies: Dg Chest 2 View  05/25/2013   CLINICAL DATA:  Shortness of breath.  History of hypertension  EXAM: CHEST  2 VIEW  COMPARISON:  03/10/2013  FINDINGS: The cardiac silhouette is enlarged. A left-sided chest wall AICD unit is identified with lead tips projecting region the right atrium and right ventricle. With variability in technique taking into consideration the stable bilateral interstitial prominence is identified with greatest confluence in the lung bases. No focal regions of consolidation are identified. No new focal infiltrates. There is thickening versus trace fluid along the minor fissure on the right. The osseous structures are unremarkable.  IMPRESSION: 1. Stable chronic interstitial findings likely reflecting pulmonary fibrosis. A superimposed component of pulmonary vascular congestion cannot be excluded clinically appropriate. 2. No new focal regions of consolidation or new focal infiltrates.   Electronically Signed   By: Salome Holmes M.D.   On: 05/25/2013 15:17   Nm Pulmonary Perf And Vent  05/25/2013   CLINICAL DATA:  Short of breath, elevated D-dimer, COPD  EXAM: NUCLEAR MEDICINE VENTILATION - PERFUSION LUNG SCAN  TECHNIQUE: Ventilation images were obtained  in multiple projections using inhaled aerosol technetium 99 M DTPA. Perfusion images were obtained in multiple projections after intravenous injection of Tc-7m MAA.  COMPARISON:  Chest radiograph 05/25/2013, CT thorax 10/27/2012  RADIOPHARMACEUTICALS:  Forty-five mCi Tc-63m DTPA aerosol and 5.2 mCi Tc-37m MAA  FINDINGS: Ventilation: There are linear ventilation defects the left and right lung elated to chronic obstructive pulmonary disease. There is a bandlike defect in the right mid lung.  Perfusion: There no wedge-shaped peripheral perfusion defects. Bandlike perfusion defect in the right mid lung matches the ventilation defect.  IMPRESSION: Very low probability for acute pulmonary embolism.  Ventilation pattern consistent with COPD.   Electronically Signed   By: Genevive Bi M.D.   On: 05/25/2013 16:31    Scheduled Meds: . budesonide-formoterol  2 puff Inhalation BID  . calcitRIOL  0.5 mcg Oral QODAY  . digoxin  125 mcg Oral q morning - 10a  . docusate sodium  100 mg Oral BID  . febuxostat  80 mg Oral Daily  . furosemide  40 mg Oral q morning - 10a  . guaiFENesin  600 mg Oral BID  .  levalbuterol  0.63 mg Nebulization Q6H  . metoprolol succinate  25 mg Oral q morning - 10a  . olopatadine  1 drop Both Eyes BID  . pantoprazole  40 mg Oral Daily  . predniSONE  60 mg Oral BID WC  . pregabalin  150 mg Oral BID  . sodium chloride  3 mL Intravenous Q12H  . sodium chloride  3 mL Intravenous Q12H  . tamsulosin  0.4 mg Oral q morning - 10a  . tiotropium  18 mcg Inhalation q morning - 10a  . warfarin  7.5 mg Oral ONCE-1800  . warfarin   Does not apply Once  . Warfarin - Pharmacist Dosing Inpatient   Does not apply q1800   Continuous Infusions: . heparin 1,450 Units/hr (05/27/13 0800)    Principal Problem:   Lower leg DVT (deep venous thromboembolism), acute Active Problems:   OBSTRUCTIVE SLEEP APNEA   COPD (chronic obstructive pulmonary disease)   Chronic systolic dysfunction of left  ventricle   Atrial fibrillation   Hemoptysis, unspecified   Pulmonary infiltrates   Dyspnea   CKD (chronic kidney disease) stage 3, GFR 30-59 ml/min   S/P IVC filter   Acute DVT (deep venous thrombosis)    Time spent: 25    [facility]  [facility] Pager [phone]. If 7PM-7AM, please contact night-coverage at [email or web address], password [facility] 05/27/2013, 2:09 PM  LOS: 2 days

## 2013-05-28 LAB — CBC
Hemoglobin: 12.7 g/dL — ABNORMAL LOW (ref 13.0–17.0)
MCH: 27.1 pg (ref 26.0–34.0)
MCHC: 31.5 g/dL (ref 30.0–36.0)
Platelets: 131 10*3/uL — ABNORMAL LOW (ref 150–400)

## 2013-05-28 LAB — PROTIME-INR
INR: 1.16 (ref 0.00–1.49)
Prothrombin Time: 14.6 seconds (ref 11.6–15.2)

## 2013-05-28 LAB — HEPARIN LEVEL (UNFRACTIONATED)
Heparin Unfractionated: 0.87 IU/mL — ABNORMAL HIGH (ref 0.30–0.70)
Heparin Unfractionated: 0.84 IU/mL — ABNORMAL HIGH (ref 0.30–0.70)
Heparin Unfractionated: 0.77 IU/mL — ABNORMAL HIGH (ref 0.30–0.70)

## 2013-05-28 LAB — BASIC METABOLIC PANEL
Calcium: 9.7 mg/dL (ref 8.4–10.5)
Chloride: 103 mEq/L (ref 96–112)
GFR calc Af Amer: 44 mL/min — ABNORMAL LOW (ref >90)
GFR calc non Af Amer: 38 mL/min — ABNORMAL LOW (ref >90)
Glucose, Bld: 141 mg/dL — ABNORMAL HIGH (ref 70–99)
Potassium: 4 mEq/L (ref 3.5–5.1)
Sodium: 139 mEq/L (ref 135–145)

## 2013-05-28 MED ORDER — HEPARIN (PORCINE) IN NACL 100-0.45 UNIT/ML-% IJ SOLN
850.0000 [IU]/h | INTRAMUSCULAR | Status: DC
  Administered 2013-05-28: 16:00:00 850 [IU]/h via INTRAVENOUS
  Filled 2013-05-28 (×2): qty 250

## 2013-05-28 MED ORDER — PREDNISONE 50 MG PO TABS
60.0000 mg | ORAL_TABLET | Freq: Every day | ORAL | Status: DC
  Administered 2013-05-29 – 2013-05-31 (×3): 60 mg via ORAL
  Filled 2013-05-28 (×4): qty 1

## 2013-05-28 MED ORDER — BENZONATATE 100 MG PO CAPS
100.0000 mg | ORAL_CAPSULE | Freq: Two times a day (BID) | ORAL | Status: DC | PRN
  Administered 2013-05-30: 23:00:00 100 mg via ORAL
  Filled 2013-05-28: qty 1

## 2013-05-28 MED ORDER — WARFARIN SODIUM 10 MG PO TABS
10.0000 mg | ORAL_TABLET | Freq: Once | ORAL | Status: AC
  Administered 2013-05-28: 19:00:00 10 mg via ORAL
  Filled 2013-05-28: qty 1

## 2013-05-28 NOTE — Progress Notes (Signed)
Spoke with pt regarding cpap tonight.  Pt stated he doesn't want to wear it tonight.  Pt will have his wife bring in his mask from home to use tomorrow night.  Pt was advised that RT is available all night should he change his mind and encouraged him to let his nurse know.  RN notified.

## 2013-05-28 NOTE — Progress Notes (Signed)
PHARMACY BRIEF NOTE:  IV HEPARIN  Indication: subacute DVT LLE  History of chronic hemoptysis in setting of pulmonary fibrosis noted.  Heparin level 0.87 on 1450 units/hr PT 14.6, INR 1.16 Hgb 12.7 Hct  40.3 Pltc 131 K  RN confirms infusion rate, states no problems with infusion overnight.  Assessment:  Heparin level supratherapeutic  Hgb and pltc slightly low but stable  No active bleeding reported Monitor platelets by anticoagulation protocol: Yes  Goal range 0.3 - 0.5 due to recent hemoptysis  Plan: 1.  Reduce heparin rate to 1100 units/hr 2.  Recheck heparin level at 12 noon 3.  Full note and orders for warfarin dose to follow later today.  Elie Goody, PharmD, BCPS Pager: [phone] 05/28/2013  6:16 AM

## 2013-05-28 NOTE — Progress Notes (Signed)
[facility] PROGRESS NOTE  Maurice Delgado UJW:[id number] DOB: 09/19/1933 DOA: 05/25/2013 PCP: [name], MD  Assessment/Plan: #1 Subacute left lower extremity DVT:  -Admitting M.D. discussed this complicated scenario with Dr. [name] with vascular surgery. ED physician had discussed this with the interventional radiology. At this time there is no role for local intervention to that leg. The best course of action would be to try anticoagulation.  -Also discussed patient with Dr. Walker Shadow on 11/26 and he agrees recommends to continue anticoagulation with heparin and to add Coumadin- patient continue to have hemoptysis but states that still this is much less than what he had in the past -Plan is to monitor patient on anticoagulation and if hemoptysis worsens then he would need to be DC - ultrasound of renal duplex unable to completely rule out non-occlusive renal vein thrombosis -Will continue anticoagulation with Coumadin/heparin as per above plan, hemoptysis is not any worse at this time per patient report and his hemoglobin is remaining -INR is still subtherapeutic at 1.16 today #2 dyspnea in the setting of COPD, and possible pulmonary fibrosis: He ran out of Symbicort 2 weeks ago. That is probably the reason for his acute decompensation.  - Dyspnea Clinically improved, continue bronchodilators and prednisone #3 history of nonischemic cardiomyopathy with EF of 20-25%: He has AICD in place. Continue with his home medications.digoxin level is 0.6. He's not an ACE inhibitor probably because of his chronic kidney disease.  -Continue beta blockers  - Continue with Lasix #4 history of atrial fibrillation: He is has a paced rhythm.  -Continue telemetry monitoring, stable #5 history of hemoptysis with pulmonary fibrosis: Patient has refused biopsies in the past. He states that the. Hemoptysis has significantly improved. However, he has noticed occasional dark clots when he coughs.   -Continue monitoring, the patient hemoptysis not worsening at this time and hemoglobin stable -As discussed above, appreciate pulmonology assistance #6 history of chronic kidney disease, stage III: Continue monitoring renal function closely.  -Creatinine continues to gradually improve #7 history of obstructive sleep apnea: Utilize CPAP while sleeping.  #8 recent left fibular fracture: Nonsurgical approach was taken. According to the wife, Dr. [name] told him that the fracture was healing quite well.  -To followup with orthopedics in the office in 3 weeks. PT/OT.  Code Status: full Family Communication: none at bedside  Disposition Plan: to home when medically stable   Consultants:  Pulmonology  Procedures:  None  Antibiotics: None HPI/Subjective: States minimal hemoptysis, denies SOB  Objective: Filed Vitals:   05/28/13 0535  BP: 145/98  Pulse: 96  Temp: 97.4 F (36.3 C)  Resp: 18    Intake/Output Summary (Last 24 hours) at 05/28/13 1119 Last data filed at 05/28/13 0536  Gross per 24 hour  Intake 2428.43 ml  Output   1250 ml  Net 1178.43 ml   Filed Weights   05/26/13 0914 05/27/13 0610 05/28/13 0500  Weight: 124.648 kg (274 lb 12.8 oz) 124.605 kg (274 lb 11.3 oz) 124.1 kg (273 lb 9.5 oz)    Exam:  General: alert & oriented x 3 In NAD Cardiovascular: RRR, nl S1 s2 Respiratory: Moderate air movement, no crackles or wheezes Abdomen: soft +BS NT/ND, no masses palpable Extremities: Markedly edematous left lower extremity, no cyanosis and no edema    Data Reviewed: Basic Metabolic Panel:  Recent Labs Lab 05/25/13 1335 05/25/13 1342 05/26/13 0155 05/28/13 0343  NA 140 143 140 139  K 4.2 3.8 3.5 4.0  CL 102 102 102 103  CO2 28  --  30 27  GLUCOSE 96 89 138* 141*  BUN 17 18 18 23   CREATININE 1.76* 1.90* 1.73* 1.66*  CALCIUM 10.0  --  9.5 9.7   Liver Function Tests:  Recent Labs Lab 05/26/13 0155  AST 14  ALT 11  ALKPHOS 97  BILITOT 1.1  PROT  6.7  ALBUMIN 3.1*   No results found for this basename: LIPASE, AMYLASE,  in the last 168 hours No results found for this basename: AMMONIA,  in the last 168 hours CBC:  Recent Labs Lab 05/25/13 1335 05/25/13 1342 05/26/13 0155 05/27/13 0421 05/28/13 0343  WBC 4.9  --  5.4 8.4 10.8*  NEUTROABS 3.3  --   --   --   --   HGB 13.4 15.3 12.8* 12.7* 12.7*  HCT 41.2 45.0 39.8 39.5 40.3  MCV 86.0  --  85.6 84.9 85.9  PLT 123*  --  120* 127* 131*   Cardiac Enzymes:  Recent Labs Lab 05/25/13 1335  TROPONINI <0.30   BNP (last 3 results)  Recent Labs  07/22/12 1656 05/25/13 1335  PROBNP 4025.0* 1513.0*   CBG: No results found for this basename: GLUCAP,  in the last 168 hours  No results found for this or any previous visit (from the past 240 hour(s)).   Studies: No results found.  Scheduled Meds: . budesonide-formoterol  2 puff Inhalation BID  . calcitRIOL  0.5 mcg Oral QODAY  . digoxin  125 mcg Oral q morning - 10a  . docusate sodium  100 mg Oral BID  . febuxostat  80 mg Oral Daily  . furosemide  40 mg Oral q morning - 10a  . guaiFENesin  600 mg Oral BID  . levalbuterol  0.63 mg Nebulization Q6H  . metoprolol succinate  25 mg Oral q morning - 10a  . olopatadine  1 drop Both Eyes BID  . pantoprazole  40 mg Oral Daily  . predniSONE  60 mg Oral BID WC  . pregabalin  150 mg Oral BID  . sodium chloride  3 mL Intravenous Q12H  . sodium chloride  3 mL Intravenous Q12H  . tamsulosin  0.4 mg Oral q morning - 10a  . tiotropium  18 mcg Inhalation q morning - 10a  . Warfarin - Pharmacist Dosing Inpatient   Does not apply q1800   Continuous Infusions: . heparin 1,100 Units/hr (05/28/13 1610)    Principal Problem:   Lower leg DVT (deep venous thromboembolism), acute Active Problems:   OBSTRUCTIVE SLEEP APNEA   COPD (chronic obstructive pulmonary disease)   Chronic systolic dysfunction of left ventricle   Atrial fibrillation   Hemoptysis, unspecified   Pulmonary  infiltrates   Dyspnea   CKD (chronic kidney disease) stage 3, GFR 30-59 ml/min   S/P IVC filter   Acute DVT (deep venous thrombosis)    Time spent: 25    [facility]  [facility] Pager [phone]. If 7PM-7AM, please contact night-coverage at [email or web address], password [facility] 05/28/2013, 11:19 AM  LOS: 3 days

## 2013-05-28 NOTE — Progress Notes (Addendum)
Pharmacy Consult Note - IV Heparin  Labs: heparin level 0.77  A/P: Heparin level still supratherapeutic (goal 0.3-0.7) after STAT recheck of level to make sure that level was still elevated after rate was decreased and first recheck level was still 0.87. No reported bleeding. Reduce heparin rate from 1100 units/hr to 850 units/hr. Recheck level at [facility], PharmD, BCPS Pager [phone] 05/28/2013 3:43 PM

## 2013-05-28 NOTE — Progress Notes (Signed)
ANTICOAGULATION CONSULT NOTE - Follow Up Consult  Pharmacy Consult for:  IV Heparin, Coumadin Indication:  VTE treatment -- LLE DVT  No Known Allergies  Patient Measurements: Height: 6\' 2"  (188 cm) Weight: 273 lb 9.5 oz (124.1 kg) IBW/kg (Calculated) : 82.2 Heparin Dosing Weight: 109 kg  Vital Signs: Temp: 97.4 F (36.3 C) (11/28 0535) Temp src: Oral (11/28 0535) BP: 145/98 mmHg (11/28 0535) Pulse Rate: 96 (11/28 0535)  Labs:  Recent Labs  05/25/13 1827  05/26/13 0155  05/27/13 0421 05/28/13 0343 05/28/13 1227  HGB  --   < > 12.8*  --  12.7* 12.7*  --   HCT  --   --  39.8  --  39.5 40.3  --   PLT  --   --  120*  --  127* 131*  --   APTT 37  --   --   --   --   --   --   LABPROT 13.8  --   --   --  13.9 14.6  --   INR 1.08  --   --   --  1.09 1.16  --   HEPARINUNFRC  --   --  0.30  < > 0.55 0.87* 0.84*  CREATININE  --   --  1.73*  --   --  1.66*  --   < > = values in this interval not displayed.  Estimated Creatinine Clearance: 52.2 ml/min (by C-G formula based on Cr of 1.66).   Medications:  Scheduled:  . budesonide-formoterol  2 puff Inhalation BID  . calcitRIOL  0.5 mcg Oral QODAY  . digoxin  125 mcg Oral q morning - 10a  . docusate sodium  100 mg Oral BID  . febuxostat  80 mg Oral Daily  . furosemide  40 mg Oral q morning - 10a  . guaiFENesin  600 mg Oral BID  . levalbuterol  0.63 mg Nebulization Q6H  . metoprolol succinate  25 mg Oral q morning - 10a  . olopatadine  1 drop Both Eyes BID  . pantoprazole  40 mg Oral Daily  . [START ON 05/29/2013] predniSONE  60 mg Oral Q breakfast  . pregabalin  150 mg Oral BID  . sodium chloride  3 mL Intravenous Q12H  . sodium chloride  3 mL Intravenous Q12H  . tamsulosin  0.4 mg Oral q morning - 10a  . tiotropium  18 mcg Inhalation q morning - 10a  . Warfarin - Pharmacist Dosing Inpatient   Does not apply q1800    Assessment: 79 yo M with PMH of atrial fibrillation not on anticoagulation d/t history of chronic  hemoptysis with pulmonary fibrosis of unclear etiology, COPD, obstructive sleep apnea on CPAP, who sustained an injury about 5 weeks ago, resulting in a spiral fracture of the distal fibula on the left. Appears he has h/o DVT in past with IVC filter in place. Dopplers reveal subacute DVT LLE, started on VTE treatment with Heparin. Heparin level = 0.84 (elevated), heparin rate decreased this am 1450 units/hr to 1100 units/hr for heparin level = 0.87 CBC: hgb = 12.7 stable, plts = 131 (trending up)  Today's INR = 1.16 Received Warfarin 7.5 mg x 2. INR remains subtherapeutic. No further bleeding issues reported Monitoring for changes in volume of hemoptysis, per CCM, if increases may have to consider discontinuation of anticoagulation with IVC filter only. At this point the patient is willing to accept the risk/benefit of anticoagulation. Patient    Goals  of Therapy:   Heparin level 0.3-0.5 units/ml  Monitor platelets by anticoagulation protocol: Yes  INR 2-3   Plan:   Heparin level does not make sense, no change following 2.5unit/kg/h decrease, will repeat to confirm not lab error, heparin at current rate and level drawn in opposite arm.    Continue daily Heparin level and CBC.  Give Coumadin 10 mg tonigh d/t slow INR trend (appears when previously on warfarin on 6/9mg )  Follow the PT/INR daily.  Juliette Alcide, PharmD, BCPS.   Pager: [phone] 05/28/2013 1:47 PM     ,

## 2013-05-29 DIAGNOSIS — Z9889 Other specified postprocedural states: Secondary | ICD-10-CM

## 2013-05-29 LAB — CBC
HCT: 40.2 % (ref 39.0–52.0)
Hemoglobin: 12.7 g/dL — ABNORMAL LOW (ref 13.0–17.0)
MCH: 27.2 pg (ref 26.0–34.0)
MCHC: 31.6 g/dL (ref 30.0–36.0)
MCV: 86.1 fL (ref 78.0–100.0)
RDW: 16.5 % — ABNORMAL HIGH (ref 11.5–15.5)

## 2013-05-29 LAB — PROTIME-INR: INR: 1.34 (ref 0.00–1.49)

## 2013-05-29 LAB — HEPARIN LEVEL (UNFRACTIONATED): Heparin Unfractionated: 0.36 IU/mL (ref 0.30–0.70)

## 2013-05-29 MED ORDER — HEPARIN (PORCINE) IN NACL 100-0.45 UNIT/ML-% IJ SOLN
950.0000 [IU]/h | INTRAMUSCULAR | Status: DC
  Administered 2013-05-29: 950 [IU]/h via INTRAVENOUS
  Filled 2013-05-29 (×2): qty 250

## 2013-05-29 MED ORDER — WARFARIN SODIUM 10 MG PO TABS
10.0000 mg | ORAL_TABLET | Freq: Once | ORAL | Status: AC
  Administered 2013-05-29: 18:00:00 10 mg via ORAL
  Filled 2013-05-29: qty 1

## 2013-05-29 NOTE — Progress Notes (Signed)
Patient refuses CPAP at this time.  Patient states the hospital provided mask is too small.  Patient says he will resume CPAP therapy once he is home.

## 2013-05-29 NOTE — Progress Notes (Signed)
Physical Therapy Treatment Patient Details Name: Maurice Delgado MRN: [id number] DOB: 1933/12/30 Today's Date: 05/29/2013 Time: 4098-1191 PT Time Calculation (min): 33 min  PT Assessment / Plan / Recommendation  History of Present Illness DVT L LEG< recent non surgical fibula fx, wears fx boot.   PT Comments   Pt desired to go into bathroom. Encouraged pt to place Fx boot on but pt requested not, also declined RW. Pt is mobile even with L LE issues, just very deconditioned given other comorbidities.  Follow Up Recommendations  Home health PT (for conditioning)     Does the patient have the potential to tolerate intense rehabilitation     Barriers to Discharge        Equipment Recommendations  None recommended by PT    Recommendations for Other Services    Frequency Min 3X/week   Progress towards PT Goals Progress towards PT goals: Progressing toward goals  Plan Current plan remains appropriate;Discharge plan needs to be updated    Precautions / Restrictions Precautions Precautions: Fall Required Braces or Orthoses: Other Brace/Splint Other Brace/Splint: has Cam/fx boot in room but not on at present.   Pertinent Vitals/Pain No c/o pain    Mobility  Transfers Transfers: Sit to Stand;Stand to Sit Sit to Stand: 5: Supervision;From bed;From toilet Stand to Sit: 6: Modified independent (Device/Increase time);To toilet;To bed Squat Pivot Transfers: 6: Modified independent (Device/Increase time) Ambulation/Gait Ambulation/Gait Assistance: 5: Supervision Ambulation Distance (Feet): 15 Feet (x2) Ambulation/Gait Assistance Details: IV pole, pt declined placing Cam boot and use of RW to walk to BR Gait Pattern: Step-to pattern;Antalgic    Exercises     PT Diagnosis:    PT Problem List:   PT Treatment Interventions:     PT Goals (current goals can now be found in the care plan section)    Visit Information  Assistance Needed: +1 History of Present Illness: DVT L LEG<  recent non surgical fibula fx, wears fx boot.    Subjective Data      Cognition  Cognition Arousal/Alertness: Awake/alert    Balance     End of Session PT - End of Session Activity Tolerance: Patient limited by fatigue Patient left: with call bell/phone within reach (on edge of bed.) Nurse Communication: Mobility status   GP     Rada Hay 05/29/2013, 1:27 PM Blanchard Kelch PT [phone]

## 2013-05-29 NOTE — Progress Notes (Signed)
Pharmacy Consult Note - IV Heparin  Labs: Heparin level 0.25 with infusion at 850 units/hr  A/P: Heparin level subtherapeutic (goal 0.3-0.5).  Will increase rate slightly to not overshoot narrow goal.  Increase heparin to 950 units/hr (9.5 mL/hr) and check heparin level 8 hours following rate adjustment.  No bleeding/complications reported.   Clance Boll, PharmD, BCPS Pager: [phone] 05/29/2013 6:45 PM

## 2013-05-29 NOTE — Progress Notes (Signed)
[facility] PROGRESS NOTE  Maurice Delgado JXB:[id number] DOB: 1933/10/05 DOA: 05/25/2013 PCP: [name], MD  Assessment/Plan: #1 Subacute left lower extremity DVT:  -Admitting M.D. discussed this complicated scenario with Dr. [name] with vascular surgery. ED physician had discussed this with the interventional radiology. At this time there is no role for local intervention to that leg. The best course of action would be to try anticoagulation.  -Also discussed patient with Dr. Walker Shadow on 11/26 and he agrees recommends to continue anticoagulation with heparin and to add Coumadin- patient continue to have hemoptysis but states that still this is much less than what he had in the past -Plan is to monitor patient on anticoagulation and if hemoptysis worsens then he would need to be DC - ultrasound of renal duplex unable to completely rule out non-occlusive renal vein thrombosis -Will continue anticoagulation with Coumadin/heparin as per above plan, hemoptysis is not any worse at this time per patient report and his hemoglobin is remaining stable. -INR is still subtherapeutic-gradually trending up 1.3 today #2 dyspnea in the setting of COPD, and possible pulmonary fibrosis: He ran out of Symbicort 2 weeks ago. That is probably the reason for his acute decompensation.  - Dyspnea Clinically improved, continue bronchodilators and prednisone #3 history of nonischemic cardiomyopathy with EF of 20-25%: He has AICD in place. Continue with his home medications.digoxin level is 0.6. He's not an ACE inhibitor probably because of his chronic kidney disease.  -Continue beta blockers  - Continue with Lasix #4 history of atrial fibrillation: He is has a paced rhythm.  -Continue telemetry monitoring, stable #5 history of hemoptysis with pulmonary fibrosis: Patient has refused biopsies in the past. He states that the. Hemoptysis has significantly improved. However, he has noticed occasional dark  clots when he coughs.  -Continue monitoring, the patient hemoptysis not worsening at this time and hemoglobin stable -As discussed above, appreciate pulmonology assistance #6 history of chronic kidney disease, stage III: Continue monitoring renal function closely.  -Creatinine continues to gradually improve #7 history of obstructive sleep apnea: Utilize CPAP while sleeping.  #8 recent left fibular fracture: Nonsurgical approach was taken. According to the wife, Dr. [name] told him that the fracture was healing quite well.  -To followup with orthopedics in the office in 3 weeks. PT/OT.  Code Status: full Family Communication: none at bedside  Disposition Plan: to home when medically stable   Consultants:  Pulmonology  Procedures:  None  Antibiotics: None HPI/Subjective: No increase in hemoptysis, denies SOB  Objective: Filed Vitals:   05/29/13 1022  BP: 141/89  Pulse: 63  Temp:   Resp:     Intake/Output Summary (Last 24 hours) at 05/29/13 1223 Last data filed at 05/29/13 0835  Gross per 24 hour  Intake 1095.72 ml  Output   1490 ml  Net -394.28 ml   Filed Weights   05/27/13 0610 05/28/13 0500 05/29/13 0500  Weight: 124.605 kg (274 lb 11.3 oz) 124.1 kg (273 lb 9.5 oz) 125.8 kg (277 lb 5.4 oz)    Exam:  General: alert & oriented x 3 In NAD Cardiovascular: RRR, nl S1 s2 Respiratory: Moderate air movement, no crackles or wheezes Abdomen: soft +BS NT/ND, no masses palpable Extremities: Markedly edematous left lower extremity, no cyanosis and no edema    Data Reviewed: Basic Metabolic Panel:  Recent Labs Lab 05/25/13 1335 05/25/13 1342 05/26/13 0155 05/28/13 0343  NA 140 143 140 139  K 4.2 3.8 3.5 4.0  CL 102 102 102 103  CO2 28  --  30 27  GLUCOSE 96 89 138* 141*  BUN 17 18 18 23   CREATININE 1.76* 1.90* 1.73* 1.66*  CALCIUM 10.0  --  9.5 9.7   Liver Function Tests:  Recent Labs Lab 05/26/13 0155  AST 14  ALT 11  ALKPHOS 97  BILITOT 1.1  PROT  6.7  ALBUMIN 3.1*   No results found for this basename: LIPASE, AMYLASE,  in the last 168 hours No results found for this basename: AMMONIA,  in the last 168 hours CBC:  Recent Labs Lab 05/25/13 1335 05/25/13 1342 05/26/13 0155 05/27/13 0421 05/28/13 0343 05/29/13 0402  WBC 4.9  --  5.4 8.4 10.8* 10.3  NEUTROABS 3.3  --   --   --   --   --   HGB 13.4 15.3 12.8* 12.7* 12.7* 12.7*  HCT 41.2 45.0 39.8 39.5 40.3 40.2  MCV 86.0  --  85.6 84.9 85.9 86.1  PLT 123*  --  120* 127* 131* 123*   Cardiac Enzymes:  Recent Labs Lab 05/25/13 1335  TROPONINI <0.30   BNP (last 3 results)  Recent Labs  07/22/12 1656 05/25/13 1335  PROBNP 4025.0* 1513.0*   CBG: No results found for this basename: GLUCAP,  in the last 168 hours  No results found for this or any previous visit (from the past 240 hour(s)).   Studies: No results found.  Scheduled Meds: . budesonide-formoterol  2 puff Inhalation BID  . calcitRIOL  0.5 mcg Oral QODAY  . digoxin  125 mcg Oral q morning - 10a  . docusate sodium  100 mg Oral BID  . febuxostat  80 mg Oral Daily  . furosemide  40 mg Oral q morning - 10a  . guaiFENesin  600 mg Oral BID  . levalbuterol  0.63 mg Nebulization Q6H  . metoprolol succinate  25 mg Oral q morning - 10a  . olopatadine  1 drop Both Eyes BID  . pantoprazole  40 mg Oral Daily  . predniSONE  60 mg Oral Q breakfast  . pregabalin  150 mg Oral BID  . sodium chloride  3 mL Intravenous Q12H  . sodium chloride  3 mL Intravenous Q12H  . tamsulosin  0.4 mg Oral q morning - 10a  . tiotropium  18 mcg Inhalation q morning - 10a  . warfarin  10 mg Oral ONCE-1800  . Warfarin - Pharmacist Dosing Inpatient   Does not apply q1800   Continuous Infusions: . heparin 850 Units/hr (05/28/13 1550)    Principal Problem:   Lower leg DVT (deep venous thromboembolism), acute Active Problems:   OBSTRUCTIVE SLEEP APNEA   COPD (chronic obstructive pulmonary disease)   Chronic systolic dysfunction  of left ventricle   Atrial fibrillation   Hemoptysis, unspecified   Pulmonary infiltrates   Dyspnea   CKD (chronic kidney disease) stage 3, GFR 30-59 ml/min   S/P IVC filter   Acute DVT (deep venous thrombosis)    Time spent: 25    [facility]  [facility] Pager [phone]. If 7PM-7AM, please contact night-coverage at [email or web address], password [facility] 05/29/2013, 12:23 PM  LOS: 4 days

## 2013-05-29 NOTE — Progress Notes (Signed)
ANTICOAGULATION CONSULT NOTE - Follow Up Consult  Pharmacy Consult for:  Coumadin Indication:  VTE treatment -- LLE DVT  No Known Allergies  Patient Measurements: Height: 6\' 2"  (188 cm) Weight: 277 lb 5.4 oz (125.8 kg) IBW/kg (Calculated) : 82.2 Heparin Dosing Weight: 109 kg  Vital Signs: Temp: 98.5 F (36.9 C) (11/29 0614) Temp src: Oral (11/29 0614) BP: 149/94 mmHg (11/29 0614) Pulse Rate: 67 (11/29 0614)  Labs:  Recent Labs  05/27/13 0421  05/28/13 0343 05/28/13 1227 05/28/13 1504 05/29/13 0402  HGB 12.7*  --  12.7*  --   --  12.7*  HCT 39.5  --  40.3  --   --  40.2  PLT 127*  --  131*  --   --  123*  LABPROT 13.9  --  14.6  --   --  16.3*  INR 1.09  --  1.16  --   --  1.34  HEPARINUNFRC 0.55  < > 0.87* 0.84* 0.77* 0.46  CREATININE  --   --  1.66*  --   --   --   < > = values in this interval not displayed.  Estimated Creatinine Clearance: 52.5 ml/min (by C-G formula based on Cr of 1.66).    Assessment: 79 yo M with PMH of atrial fibrillation not on anticoagulation d/t history of chronic hemoptysis with pulmonary fibrosis of unclear etiology, COPD, obstructive sleep apnea on CPAP, who sustained an injury about 5 weeks ago, resulting in a spiral fracture of the distal fibula on the left. Appears he has h/o DVT in past with IVC filter in place. Dopplers reveal subacute DVT LLE, started on VTE treatment with Heparin. CBC: hgb = 12.7 stable, plts = 123 (essentially stable)  Today's INR = 1.34 Received warfarin 7.5 mg, 7.5mg , 10mg . INR remains subtherapeutic. No further bleeding issues reported Monitoring for changes in volume of hemoptysis, per CCM, if increases may have to consider discontinuation of anticoagulation with IVC filter only. At this point the patient is willing to accept the risk/benefit of anticoagulation.  Heparin note already written this AM, follow-up heparin level scheduled today at 1800  Goals of Therapy:   Monitor platelets by anticoagulation  protocol: Yes  INR 2-3   Plan:   Coumadin 10 mg PO x 1 tonight  Follow the PT/INR daily.  Thank you for the consult.  Tomi Bamberger, PharmD, BCPS Clinical Pharmacist Pager: [phone] Pharmacy: [phone] 05/29/2013 8:14 AM

## 2013-05-29 NOTE — Progress Notes (Signed)
ANTICOAGULATION CONSULT NOTE - Follow Up Consult  Pharmacy Consult for Heparin Indication: subacute DVT LLE  No Known Allergies  Patient Measurements: Height: 6\' 2"  (188 cm) Weight: 277 lb 5.4 oz (125.8 kg) IBW/kg (Calculated) : 82.2 Heparin Dosing Weight:   Vital Signs: Temp: 98.5 F (36.9 C) (11/29 0614) Temp src: Oral (11/29 0614) BP: 149/94 mmHg (11/29 0614) Pulse Rate: 67 (11/29 0614)  Labs:  Recent Labs  05/27/13 0421  05/28/13 0343 05/28/13 1227 05/28/13 1504 05/29/13 0402  HGB 12.7*  --  12.7*  --   --  12.7*  HCT 39.5  --  40.3  --   --  40.2  PLT 127*  --  131*  --   --  123*  LABPROT 13.9  --  14.6  --   --  16.3*  INR 1.09  --  1.16  --   --  1.34  HEPARINUNFRC 0.55  < > 0.87* 0.84* 0.77* 0.46  CREATININE  --   --  1.66*  --   --   --   < > = values in this interval not displayed.  Estimated Creatinine Clearance: 52.5 ml/min (by C-G formula based on Cr of 1.66).   Medications:  Infusions:  . heparin 850 Units/hr (05/28/13 1550)    Assessment: Patient with two heparin levels at goal.  FYI: 11/29 0000 HL shows in EPIC as 11/28 0000 but reports at 00:08 11/29. No issues with drip/bleeding per RN.  Goal of Therapy:  0.3 - 0.5 due to recent hemoptysis  Monitor platelets by anticoagulation protocol: Yes   Plan:  Continue heparin at current rate.  Recheck level at 1800, due to prior extended time after two Heparin levels at goal had level of 0.84.    Darlina Guys, Jacquenette Shone Crowford 05/29/2013,6:18 AM

## 2013-05-30 LAB — CBC
HCT: 39.7 % (ref 39.0–52.0)
Hemoglobin: 12.8 g/dL — ABNORMAL LOW (ref 13.0–17.0)
MCH: 27.5 pg (ref 26.0–34.0)
MCHC: 32.2 g/dL (ref 30.0–36.0)
RBC: 4.65 MIL/uL (ref 4.22–5.81)
WBC: 9.6 10*3/uL (ref 4.0–10.5)

## 2013-05-30 LAB — HEPARIN LEVEL (UNFRACTIONATED): Heparin Unfractionated: 0.33 IU/mL (ref 0.30–0.70)

## 2013-05-30 LAB — PROTIME-INR: Prothrombin Time: 20.4 seconds — ABNORMAL HIGH (ref 11.6–15.2)

## 2013-05-30 MED ORDER — LEVALBUTEROL HCL 0.63 MG/3ML IN NEBU
0.6300 mg | INHALATION_SOLUTION | Freq: Three times a day (TID) | RESPIRATORY_TRACT | Status: DC
  Administered 2013-05-30: 0.63 mg via RESPIRATORY_TRACT
  Filled 2013-05-30 (×3): qty 3

## 2013-05-30 MED ORDER — WARFARIN SODIUM 5 MG PO TABS
5.0000 mg | ORAL_TABLET | Freq: Once | ORAL | Status: AC
  Administered 2013-05-30: 5 mg via ORAL
  Filled 2013-05-30: qty 1

## 2013-05-30 MED ORDER — LEVALBUTEROL HCL 0.63 MG/3ML IN NEBU: 0.6300 mg | INHALATION_SOLUTION | RESPIRATORY_TRACT | Status: DC | PRN

## 2013-05-30 MED ORDER — HEPARIN (PORCINE) IN NACL 100-0.45 UNIT/ML-% IJ SOLN
1150.0000 [IU]/h | INTRAMUSCULAR | Status: DC
  Administered 2013-05-31: 1150 [IU]/h via INTRAVENOUS
  Filled 2013-05-30 (×3): qty 250

## 2013-05-30 MED ORDER — HEPARIN (PORCINE) IN NACL 100-0.45 UNIT/ML-% IJ SOLN
1050.0000 [IU]/h | INTRAMUSCULAR | Status: DC
  Filled 2013-05-30 (×2): qty 250

## 2013-05-30 NOTE — Progress Notes (Signed)
[facility] PROGRESS NOTE  Maurice Delgado ZOX:[id number] DOB: [birth year] DOA: 05/25/2013 PCP: [name], MD  Assessment/Plan: #1 Subacute left lower extremity DVT:  -Admitting M.D. discussed this complicated scenario with Dr. [name] with vascular surgery. ED physician had discussed this with the interventional radiology. At this time there is no role for local intervention to that leg. The best course of action would be to try anticoagulation.  -Also discussed patient with Dr. Walker Shadow on 11/26 and he agrees recommends to continue anticoagulation with heparin and to add Coumadin- patient continue to have hemoptysis but states that still this is much less than what he had in the past -Plan is to monitor patient on anticoagulation and if hemoptysis worsens then he would need to be DC - ultrasound of renal duplex unable to completely rule out non-occlusive renal vein thrombosis -Will continue anticoagulation with Coumadin/heparin as per above plan, hemoptysis is not any worse at this time per patient report and his hemoglobin is remaining stable. -INR is still subtherapeutic-gradually trending up 1.8 today -Again educated pt on the anticoagulation process and the need for caution in him given his hemoptysis which has been ongoing for the past year. #2 dyspnea in the setting of COPD, and possible pulmonary fibrosis: He ran out of Symbicort 2 weeks ago. That is probably the reason for his acute decompensation.  - Dyspnea Clinically improved, continue outpt bronchodilators, nebs prn onlyand prednisone>> follow and further taper  #3 history of nonischemic cardiomyopathy with EF of 20-25%: He has AICD in place. Continue with his home medications.digoxin level is 0.6. He's not an ACE inhibitor probably because of his chronic kidney disease.  -Continue beta blockers  - Continue with Lasix #4 history of atrial fibrillation: He is has a paced rhythm.  -Continue telemetry monitoring,  stable #5 history of hemoptysis with pulmonary fibrosis: Patient has refused biopsies in the past. He states that the. Hemoptysis has significantly improved. However, he has noticed occasional dark clots when he coughs.  -Continue monitoring, the patient hemoptysis not worsening at this time and hemoglobin stable -As discussed above, appreciate pulmonology assistance #6 history of chronic kidney disease, stage III: Continue monitoring renal function closely.  -Creatinine continues to gradually improve #7 history of obstructive sleep apnea: Utilize CPAP while sleeping.  #8 recent left fibular fracture: Nonsurgical approach was taken. According to the wife, Dr. [name] told him that the fracture was healing quite well.  -To followup with orthopedics in the office in 3 weeks. PT/OT.  Code Status: full Family Communication: none at bedside  Disposition Plan: to home when medically stable   Consultants:  Pulmonology  Procedures:  None  Antibiotics: None HPI/Subjective: No increase in hemoptysis, denies SOB. Requesting for scheduled nebs to be dc'ed pt frustrated that anticoaglation process taking long. Objective: Filed Vitals:   05/30/13 0500  BP: 155/100  Pulse: 60  Temp: 97.4 F (36.3 C)  Resp: 20    Intake/Output Summary (Last 24 hours) at 05/30/13 1058 Last data filed at 05/30/13 0810  Gross per 24 hour  Intake   1914 ml  Output   1100 ml  Net    814 ml   Filed Weights   05/28/13 0500 05/29/13 0500 05/30/13 0500  Weight: 124.1 kg (273 lb 9.5 oz) 125.8 kg (277 lb 5.4 oz) 126.6 kg (279 lb 1.6 oz)    Exam:  General: alert & oriented x 3 In NAD Cardiovascular: RRR, nl S1 s2 Respiratory: Moderate air movement, no crackles or wheezes Abdomen: soft +BS NT/ND, no  masses palpable Extremities: Markedly edematous left lower extremity, no cyanosis and no edema    Data Reviewed: Basic Metabolic Panel:  Recent Labs Lab 05/25/13 1335 05/25/13 1342 05/26/13 0155  05/28/13 0343  NA 140 143 140 139  K 4.2 3.8 3.5 4.0  CL 102 102 102 103  CO2 28  --  30 27  GLUCOSE 96 89 138* 141*  BUN 17 18 18 23   CREATININE 1.76* 1.90* 1.73* 1.66*  CALCIUM 10.0  --  9.5 9.7   Liver Function Tests:  Recent Labs Lab 05/26/13 0155  AST 14  ALT 11  ALKPHOS 97  BILITOT 1.1  PROT 6.7  ALBUMIN 3.1*   No results found for this basename: LIPASE, AMYLASE,  in the last 168 hours No results found for this basename: AMMONIA,  in the last 168 hours CBC:  Recent Labs Lab 05/25/13 1335  05/26/13 0155 05/27/13 0421 05/28/13 0343 05/29/13 0402 05/30/13 0320  WBC 4.9  --  5.4 8.4 10.8* 10.3 9.6  NEUTROABS 3.3  --   --   --   --   --   --   HGB 13.4  < > 12.8* 12.7* 12.7* 12.7* 12.8*  HCT 41.2  < > 39.8 39.5 40.3 40.2 39.7  MCV 86.0  --  85.6 84.9 85.9 86.1 85.4  PLT 123*  --  120* 127* 131* 123* 118*  < > = values in this interval not displayed. Cardiac Enzymes:  Recent Labs Lab 05/25/13 1335  TROPONINI <0.30   BNP (last 3 results)  Recent Labs  07/22/12 1656 05/25/13 1335  PROBNP 4025.0* 1513.0*   CBG: No results found for this basename: GLUCAP,  in the last 168 hours  No results found for this or any previous visit (from the past 240 hour(s)).   Studies: No results found.  Scheduled Meds: . budesonide-formoterol  2 puff Inhalation BID  . calcitRIOL  0.5 mcg Oral QODAY  . digoxin  125 mcg Oral q morning - 10a  . docusate sodium  100 mg Oral BID  . febuxostat  80 mg Oral Daily  . furosemide  40 mg Oral q morning - 10a  . guaiFENesin  600 mg Oral BID  . metoprolol succinate  25 mg Oral q morning - 10a  . olopatadine  1 drop Both Eyes BID  . pantoprazole  40 mg Oral Daily  . predniSONE  60 mg Oral Q breakfast  . pregabalin  150 mg Oral BID  . sodium chloride  3 mL Intravenous Q12H  . sodium chloride  3 mL Intravenous Q12H  . tamsulosin  0.4 mg Oral q morning - 10a  . tiotropium  18 mcg Inhalation q morning - 10a  . Warfarin -  Pharmacist Dosing Inpatient   Does not apply q1800   Continuous Infusions: . heparin 950 Units/hr (05/29/13 1909)    Principal Problem:   Lower leg DVT (deep venous thromboembolism), acute Active Problems:   OBSTRUCTIVE SLEEP APNEA   COPD (chronic obstructive pulmonary disease)   Chronic systolic dysfunction of left ventricle   Atrial fibrillation   Hemoptysis, unspecified   Pulmonary infiltrates   Dyspnea   CKD (chronic kidney disease) stage 3, GFR 30-59 ml/min   S/P IVC filter   Acute DVT (deep venous thrombosis)    Time spent: 25    [facility] C  [facility] Pager [phone]. If 7PM-7AM, please contact night-coverage at [email or web address], password [facility] 05/30/2013, 10:58 AM  LOS: 5 days

## 2013-05-30 NOTE — Progress Notes (Signed)
ANTICOAGULATION CONSULT NOTE - Follow Up Consult  Pharmacy Consult for:  Heparin and Coumadin Indication:  VTE treatment -- LLE DVT  No Known Allergies  Patient Measurements: Height: 6\' 2"  (188 cm) Weight: 279 lb 1.6 oz (126.6 kg) IBW/kg (Calculated) : 82.2 Heparin Dosing Weight: 109 kg  Vital Signs: Temp: 97.4 F (36.3 C) (11/30 0500) Temp src: Oral (11/30 0500) BP: 155/100 mmHg (11/30 0500) Pulse Rate: 60 (11/30 0500)  Labs:  Recent Labs  05/28/13 0343  05/29/13 0402 05/29/13 1802 05/30/13 0320 05/30/13 0322  HGB 12.7*  --  12.7*  --  12.8*  --   HCT 40.3  --  40.2  --  39.7  --   PLT 131*  --  123*  --  118*  --   LABPROT 14.6  --  16.3*  --  20.4*  --   INR 1.16  --  1.34  --  1.80*  --   HEPARINUNFRC 0.87*  < > 0.46 0.25*  --  0.33  CREATININE 1.66*  --   --   --   --   --   < > = values in this interval not displayed.  Estimated Creatinine Clearance: 52.7 ml/min (by C-G formula based on Cr of 1.66).    Assessment: 79 yo M with PMH of atrial fibrillation not on anticoagulation d/t history of chronic hemoptysis with pulmonary fibrosis of unclear etiology, COPD, obstructive sleep apnea on CPAP, who sustained an injury about 5 weeks ago, resulting in a spiral fracture of the distal fibula on the left. Appears he has h/o DVT in past with IVC filter in place. Dopplers reveal subacute DVT LLE, started on VTE treatment with Heparin. CBC: hgb = 12.8 stable, plts = 118 (essentially stable)  Heparin level this AM within the conservative goal range of 0.3-0.5 at 0.33 at 950 units/hr Today's INR with large jump to 1.80 although remains SUB-therapeutic  INR trend 1.09 > 1.16 > 1.34 > 1.80  Warfarin dosing 7.5 mg, 7.5mg , 10mg , 10mg .  No further bleeding issues reported Monitoring for changes in volume of hemoptysis, per CCM, if increases may have to consider discontinuation of anticoagulation with IVC filter only. At this point the patient is willing to accept the  risk/benefit of anticoagulation.   Heparin level recheck at 12 noon BELOW therapeutic range at 0.28  No infusion line issues or bleeding per RN   Goals of Therapy:   Monitor platelets by anticoagulation protocol: Yes  INR 2-3  Heparin level 0.3 -0.5   Plan:   Coumadin 5 mg PO x 1 tonight  Dose decreased due to large INR jump overnight  Follow the PT/INR daily.  Increase heparin gtt to 1050 units/hr  Heparin level recheck at 2100  Daily heparin level and CBC  Thank you for the consult.  Tomi Bamberger, PharmD, BCPS Clinical Pharmacist Pager: [phone] Pharmacy: [phone] 05/30/2013 8:12 AM

## 2013-05-30 NOTE — Progress Notes (Signed)
ANTICOAGULATION CONSULT NOTE - Follow Up Consult  Pharmacy Consult for Heparin Indication: VTE treatment -- LLE DVT   No Known Allergies  Patient Measurements: Height: 6\' 2"  (188 cm) Weight: 277 lb 5.4 oz (125.8 kg) IBW/kg (Calculated) : 82.2 Heparin Dosing Weight:   Vital Signs:    Labs:  Recent Labs  05/28/13 0343  05/29/13 0402 05/29/13 1802 05/30/13 0320 05/30/13 0322  HGB 12.7*  --  12.7*  --  12.8*  --   HCT 40.3  --  40.2  --  39.7  --   PLT 131*  --  123*  --  118*  --   LABPROT 14.6  --  16.3*  --  20.4*  --   INR 1.16  --  1.34  --  1.80*  --   HEPARINUNFRC 0.87*  < > 0.46 0.25*  --  0.33  CREATININE 1.66*  --   --   --   --   --   < > = values in this interval not displayed.  Estimated Creatinine Clearance: 52.5 ml/min (by C-G formula based on Cr of 1.66).   Medications:  Infusions:  . heparin 950 Units/hr (05/29/13 1909)    Assessment: Patient with heparin level at goal.  No issues/bleeding noted by RN.  Goal of Therapy:  Heparin level 0.3-0.5 Monitor platelets by anticoagulation protocol: Yes   Plan:  Recheck heparin level at 1200 to assure remains within goal range.  Continue at current heparin rate.  Darlina Guys, Jacquenette Shone Crowford 05/30/2013,5:47 AM

## 2013-05-31 LAB — CBC
HCT: 40.3 % (ref 39.0–52.0)
MCHC: 31.5 g/dL (ref 30.0–36.0)
MCV: 85.6 fL (ref 78.0–100.0)
RDW: 16.5 % — ABNORMAL HIGH (ref 11.5–15.5)

## 2013-05-31 LAB — PROTIME-INR
INR: 2.04 — ABNORMAL HIGH (ref 0.00–1.49)
Prothrombin Time: 22.4 seconds — ABNORMAL HIGH (ref 11.6–15.2)

## 2013-05-31 LAB — BASIC METABOLIC PANEL
BUN: 22 mg/dL (ref 6–23)
Chloride: 102 mEq/L (ref 96–112)
Creatinine, Ser: 1.46 mg/dL — ABNORMAL HIGH (ref 0.50–1.35)
GFR calc Af Amer: 52 mL/min — ABNORMAL LOW (ref >90)
GFR calc non Af Amer: 45 mL/min — ABNORMAL LOW (ref >90)
Potassium: 3.7 mEq/L (ref 3.5–5.1)
Sodium: 139 mEq/L (ref 135–145)

## 2013-05-31 LAB — HEPARIN LEVEL (UNFRACTIONATED)
Heparin Unfractionated: 0.45 IU/mL (ref 0.30–0.70)
Heparin Unfractionated: 0.44 IU/mL (ref 0.30–0.70)

## 2013-05-31 MED ORDER — PREDNISONE 20 MG PO TABS
40.0000 mg | ORAL_TABLET | Freq: Every day | ORAL | Status: DC
  Administered 2013-06-01: 40 mg via ORAL
  Filled 2013-05-31 (×2): qty 2

## 2013-05-31 MED ORDER — WARFARIN SODIUM 5 MG PO TABS
5.0000 mg | ORAL_TABLET | Freq: Once | ORAL | Status: DC
  Filled 2013-05-31: qty 1

## 2013-05-31 MED ORDER — WARFARIN SODIUM 7.5 MG PO TABS
7.5000 mg | ORAL_TABLET | Freq: Once | ORAL | Status: AC
  Administered 2013-05-31: 7.5 mg via ORAL
  Filled 2013-05-31: qty 1

## 2013-05-31 NOTE — Progress Notes (Signed)
ANTICOAGULATION CONSULT NOTE - Follow Up Consult Brief Pharmacy Note  Pharmacy Consult for:  Heparin Indication:  VTE treatment -- LLE DVT  No Known Allergies  Assessment: 79 yo M with PMH of atrial fibrillation and subacute DVT LLE, started on VTE treatment with Heparin.  12/1:   CBC: Chronic thrombocytopenia, stable.  Hgb also low, stable.    Heparin level this AM within the conservative goal range of 0.3-0.5.    SCr improved to 1.46, CrCl 60  Heparin level recheck at 1600 continues within conservative goal at 0.44 on 1150 units/hr  Goals of Therapy:   Monitor platelets by anticoagulation protocol: Yes  Heparin level 0.3 -0.5   Plan:   Continue heparin gtt at 1150 units/hr  Daily heparin level and CBC  If INR remains tx tomorrow, can d/c heparin  Thank you for the consult.  Tomi Bamberger, PharmD, BCPS Clinical Pharmacist Pager: [phone] Pharmacy: [phone] 05/31/2013 4:12 PM '

## 2013-05-31 NOTE — Progress Notes (Addendum)
Physical Therapy Treatment Patient Details Name: Maurice Delgado MRN: [id number] DOB: Dec 02, 1933 Today's Date: 05/31/2013 Time: 1308-6578 PT Time Calculation (min): 25 min  PT Assessment / Plan / Recommendation  History of Present Illness DVT L LEG< recent non surgical fibula fx, wears fx boot.   PT Comments   **Pt is progressing well with mobility, ready to DC home from PT standpoint.*  Follow Up Recommendations  Home health PT (for conditioning)     Does the patient have the potential to tolerate intense rehabilitation     Barriers to Discharge        Equipment Recommendations  None recommended by PT    Recommendations for Other Services    Frequency Min 3X/week   Progress towards PT Goals Progress towards PT goals: Goals met and updated - see care plan  Plan Current plan remains appropriate    Precautions / Restrictions Precautions Precautions: Fall Required Braces or Orthoses: Other Brace/Splint Other Brace/Splint: has Cam/fx boot in room  Restrictions Weight Bearing Restrictions: Yes LLE Weight Bearing: Weight bearing as tolerated   Pertinent Vitals/Pain *pt denied pain SaO2 91% on 3L O2 with walking,  HR 109 with walking**    Mobility  Bed Mobility Bed Mobility: Supine to Sit Supine to Sit: 6: Modified independent (Device/Increase time);With rails Transfers Transfers: Sit to Stand;Stand to Sit Sit to Stand: 5: Supervision;From bed Stand to Sit: 6: Modified independent (Device/Increase time);To chair/3-in-1 Ambulation/Gait Ambulation/Gait Assistance: 5: Supervision Ambulation Distance (Feet): 200 Feet Assistive device: Rolling walker Ambulation/Gait Assistance Details: supervision for safety Gait Pattern: Step-to pattern Gait velocity: WFL General Gait Details: steady with RW, no LOB , Pt ambulated with 3L O2 [place], SaO2 91% walking HR 109   Exercises     PT Diagnosis:    PT Problem List:   PT Treatment Interventions:     PT Goals (current goals can  now be found in the care plan section) Acute Rehab PT Goals Patient Stated Goal: I want to go home soon PT Goal Formulation: With patient Time For Goal Achievement: 06/09/13 Potential to Achieve Goals: Good  Visit Information  Last PT Received On: 05/31/13 Assistance Needed: +1 History of Present Illness: DVT L LEG< recent non surgical fibula fx, wears fx boot.    Subjective Data  Patient Stated Goal: I want to go home soon   Cognition  Cognition Arousal/Alertness: Awake/alert Behavior During Therapy: WFL for tasks assessed/performed Overall Cognitive Status: Within Functional Limits for tasks assessed    Balance     End of Session PT - End of Session Equipment Utilized During Treatment: Gait belt Activity Tolerance: Patient tolerated treatment well Patient left: with call bell/phone within reach;in chair (on edge of bed.) Nurse Communication: Mobility status   GP     Ralene Bathe Kistler 05/31/2013, 10:21 AM [phone]

## 2013-05-31 NOTE — Progress Notes (Signed)
[facility] PROGRESS NOTE  Chaun Uemura ZOX:[id number] DOB: 27-May-1934 DOA: 05/25/2013 PCP: [name], MD  Assessment/Plan: #1 Subacute left lower extremity DVT:  -Admitting M.D. discussed this complicated scenario with Dr. [name] with vascular surgery. ED physician had discussed this with the interventional radiology. At this time there is no role for local intervention to that leg. The best course of action would be to try anticoagulation.  -Also discussed patient with Dr. Walker Shadow on 11/26 and he agrees recommends to continue anticoagulation with heparin and to add Coumadin- patient continue to have hemoptysis but states that still this is much less than what he had in the past -Plan is to monitor patient on anticoagulation and if hemoptysis worsens then he would need to be DC - ultrasound of renal duplex unable to completely rule out non-occlusive renal vein thrombosis -Will continue anticoagulation with Coumadin/heparin as per above plan, hemoptysis is not any worse at this time per patient report and his hemoglobin is remaining stable. -INR therapeutic-2.04 today -Plan is to DC heparin in a.m. if INR remains therapeutic -I have reconsulted CCM regarding the plan to monitor in house 48 hours after heparin DC'ed to e-val for further recs #2 dyspnea in the setting of COPD, and possible pulmonary fibrosis: He ran out of Symbicort 2 weeks ago. That is probably the reason for his acute decompensation.  - Dyspnea Clinically improved, continue outpt bronchodilators, nebs prn onlyand prednisone>>tapering prednisone #3 history of nonischemic cardiomyopathy with EF of 20-25%: He has AICD in place. Continue with his home medications.digoxin level is 0.6. He's not an ACE inhibitor probably because of his chronic kidney disease.  -Continue beta blockers  - Continue with Lasix #4 history of atrial fibrillation: He is has a paced rhythm.  -Continue telemetry monitoring, stable #5  history of hemoptysis with pulmonary fibrosis: Patient has refused biopsies in the past. He states that the. Hemoptysis has significantly improved. However, he has noticed occasional dark clots when he coughs.  -Continue monitoring, the patient hemoptysis not worsening at this time and hemoglobin stable -As discussed above, appreciate pulmonology assistance #6 history of chronic kidney disease, stage III: Continue monitoring renal function closely.  -Creatinine continues down to 1.46 today 12/1 #7 history of obstructive sleep apnea: Utilize CPAP while sleeping.  #8 recent left fibular fracture: Nonsurgical approach was taken. According to the wife, Dr. [name] told him that the fracture was healing quite well.  -To followup with orthopedics in the office in 3 weeks. PT/OT.  Code Status: full Family Communication: none at bedside  Disposition Plan: to home when medically stable   Consultants:  Pulmonology  Procedures:  None  Antibiotics: None HPI/Subjective: No increase in hemoptysis, denies SOB. Denies any new complaints.  Objective: Filed Vitals:   05/31/13 1140  BP:   Pulse: 74  Temp:   Resp:     Intake/Output Summary (Last 24 hours) at 05/31/13 1311 Last data filed at 05/31/13 0500  Gross per 24 hour  Intake   1854 ml  Output   2876 ml  Net  -1022 ml   Filed Weights   05/29/13 0500 05/30/13 0500 05/31/13 0426  Weight: 125.8 kg (277 lb 5.4 oz) 126.6 kg (279 lb 1.6 oz) 126.8 kg (279 lb 8.7 oz)    Exam:  General: alert & oriented x 3 In NAD Cardiovascular: RRR, nl S1 s2 Respiratory: Moderate air movement, no crackles or wheezes Abdomen: soft +BS NT/ND, no masses palpable Extremities: Markedly edematous left lower extremity, no cyanosis and no edema  Data Reviewed: Basic Metabolic Panel:  Recent Labs Lab 05/25/13 1335 05/25/13 1342 05/26/13 0155 05/28/13 0343 05/31/13 0410  NA 140 143 140 139 139  K 4.2 3.8 3.5 4.0 3.7  CL 102 102 102 103 102  CO2 28   --  30 27 26   GLUCOSE 96 89 138* 141* 183*  BUN 17 18 18 23 22   CREATININE 1.76* 1.90* 1.73* 1.66* 1.46*  CALCIUM 10.0  --  9.5 9.7 8.8   Liver Function Tests:  Recent Labs Lab 05/26/13 0155  AST 14  ALT 11  ALKPHOS 97  BILITOT 1.1  PROT 6.7  ALBUMIN 3.1*   No results found for this basename: LIPASE, AMYLASE,  in the last 168 hours No results found for this basename: AMMONIA,  in the last 168 hours CBC:  Recent Labs Lab 05/25/13 1335  05/27/13 0421 05/28/13 0343 05/29/13 0402 05/30/13 0320 05/31/13 0410  WBC 4.9  < > 8.4 10.8* 10.3 9.6 8.0  NEUTROABS 3.3  --   --   --   --   --   --   HGB 13.4  < > 12.7* 12.7* 12.7* 12.8* 12.7*  HCT 41.2  < > 39.5 40.3 40.2 39.7 40.3  MCV 86.0  < > 84.9 85.9 86.1 85.4 85.6  PLT 123*  < > 127* 131* 123* 118* 117*  < > = values in this interval not displayed. Cardiac Enzymes:  Recent Labs Lab 05/25/13 1335  TROPONINI <0.30   BNP (last 3 results)  Recent Labs  07/22/12 1656 05/25/13 1335  PROBNP 4025.0* 1513.0*   CBG: No results found for this basename: GLUCAP,  in the last 168 hours  No results found for this or any previous visit (from the past 240 hour(s)).   Studies: No results found.  Scheduled Meds: . budesonide-formoterol  2 puff Inhalation BID  . calcitRIOL  0.5 mcg Oral QODAY  . digoxin  125 mcg Oral q morning - 10a  . docusate sodium  100 mg Oral BID  . febuxostat  80 mg Oral Daily  . furosemide  40 mg Oral q morning - 10a  . guaiFENesin  600 mg Oral BID  . metoprolol succinate  25 mg Oral q morning - 10a  . olopatadine  1 drop Both Eyes BID  . pantoprazole  40 mg Oral Daily  . predniSONE  60 mg Oral Q breakfast  . pregabalin  150 mg Oral BID  . sodium chloride  3 mL Intravenous Q12H  . sodium chloride  3 mL Intravenous Q12H  . tamsulosin  0.4 mg Oral q morning - 10a  . tiotropium  18 mcg Inhalation q morning - 10a  . warfarin  7.5 mg Oral ONCE-1800  . Warfarin - Pharmacist Dosing Inpatient   Does  not apply q1800   Continuous Infusions: . heparin 1,150 Units/hr (05/30/13 2340)    Principal Problem:   Lower leg DVT (deep venous thromboembolism), acute Active Problems:   OBSTRUCTIVE SLEEP APNEA   COPD (chronic obstructive pulmonary disease)   Chronic systolic dysfunction of left ventricle   Atrial fibrillation   Hemoptysis, unspecified   Pulmonary infiltrates   Dyspnea   CKD (chronic kidney disease) stage 3, GFR 30-59 ml/min   S/P IVC filter   Acute DVT (deep venous thrombosis)    Time spent: 25    [facility]  [facility] Pager [phone]. If 7PM-7AM, please contact night-coverage at [email or web address], password [facility] 05/31/2013, 1:11 PM  LOS: 6 days

## 2013-05-31 NOTE — Progress Notes (Addendum)
ANTICOAGULATION CONSULT NOTE - Follow Up Consult  Pharmacy Consult for:  Heparin and Coumadin Indication:  VTE treatment -- LLE DVT  No Known Allergies  Patient Measurements: Height: 6\' 2"  (188 cm) Weight: 279 lb 8.7 oz (126.8 kg) IBW/kg (Calculated) : 82.2 Heparin Dosing Weight: 109 kg  Vital Signs: Temp: 97.8 F (36.6 C) (12/01 0423) Temp src: Oral (12/01 0423) BP: 147/96 mmHg (12/01 0423) Pulse Rate: 70 (12/01 0423)  Labs:  Recent Labs  05/29/13 0402  05/30/13 0320  05/30/13 1217 05/30/13 2050 05/31/13 0410 05/31/13 0732  HGB 12.7*  --  12.8*  --   --   --  12.7*  --   HCT 40.2  --  39.7  --   --   --  40.3  --   PLT 123*  --  118*  --   --   --  117*  --   LABPROT 16.3*  --  20.4*  --   --   --  22.4*  --   INR 1.34  --  1.80*  --   --   --  2.04*  --   HEPARINUNFRC 0.46  < >  --   < > 0.28* 0.23*  --  0.45  CREATININE  --   --   --   --   --   --  1.46*  --   < > = values in this interval not displayed.  Estimated Creatinine Clearance: 59.9 ml/min (by C-G formula based on Cr of 1.46).    Assessment: 79 yo M with PMH of atrial fibrillation not on anticoagulation d/t history of chronic hemoptysis with pulmonary fibrosis of unclear etiology, COPD, obstructive sleep apnea on CPAP, who sustained an injury about 5 weeks ago, resulting in a spiral fracture of the distal fibula on the left. Appears he has h/o DVT in past with IVC filter in place. Dopplers reveal subacute DVT LLE, started on VTE treatment with Heparin.  12/1:   D#6 warfarin/heparin bridge  CBC: Chronic thrombocytopenia, stable.  Hgb also low, stable.    Heparin level this AM within the conservative goal range of 0.3-0.5.    INR therapeutic @ 2.04.     SCr improved to 1.46, CrCl 60  Monitoring for changes in volume of hemoptysis, per CCM, if increases may have to consider discontinuation of anticoagulation with IVC filter only. At this point the patient is willing to accept the risk/benefit of  anticoagulation  Goals of Therapy:   Monitor platelets by anticoagulation protocol: Yes  INR 2-3  Heparin level 0.3 -0.5   Plan:   Coumadin 7.5 mg PO x 1 tonight  PT/INR daily.  Continue heparin gtt at 1150 units/hr  Daily heparin level and CBC  Recheck heparin level at 1600  If INR remains tx tomorrow, can d/c heparin  Thank you for the consult.  Haynes Hoehn, PharmD 05/31/2013, 8:48 AM  Pager: [phone]

## 2013-05-31 NOTE — Progress Notes (Signed)
Pt refused CPAP for tonight. Pt states the hospital mask that was provided is too small and his wife forgot to bring his home mask in. Pt says he will resume CPAP once he is home.

## 2013-05-31 NOTE — Progress Notes (Signed)
Called by Dr. Donna Bernard regarding discharge planning.  Discussed details of case with Dr. Sung Amabile and he is comfortable with patient to discharge in am if no further increase in hemoptysis (thus far he has not had an increase).    Plan: -continue anticoagulation  -close follow up for INR -outpatient follow up with Dr. Shelle Iron arranged -->see discharge section   Canary Brim, NP-C [facility] Pgr: [phone] or [phone]

## 2013-05-31 NOTE — Progress Notes (Signed)
Ask to restart PIV, patient requesting that PIV not be changed, states, "I am going home tomorrow." Assessed with a small amt of dried blood noted. No S & S of infection. No swelling, redness or tenderness noted. Dressing intact and sets within date. Will continue to monitor.

## 2013-06-01 DIAGNOSIS — I499 Cardiac arrhythmia, unspecified: Secondary | ICD-10-CM

## 2013-06-01 LAB — CBC
HCT: 41.2 % (ref 39.0–52.0)
Hemoglobin: 13 g/dL (ref 13.0–17.0)
MCH: 27.2 pg (ref 26.0–34.0)
MCHC: 31.6 g/dL (ref 30.0–36.0)
MCV: 86.2 fL (ref 78.0–100.0)
Platelets: 123 10*3/uL — ABNORMAL LOW (ref 150–400)

## 2013-06-01 LAB — PROTIME-INR: INR: 2.24 — ABNORMAL HIGH (ref 0.00–1.49)

## 2013-06-01 MED ORDER — OMEPRAZOLE 40 MG PO CPDR: 40.0000 mg | DELAYED_RELEASE_CAPSULE | Freq: Every day | ORAL | Status: DC

## 2013-06-01 MED ORDER — WARFARIN SODIUM 5 MG PO TABS: 7.5000 mg | ORAL_TABLET | Freq: Once | ORAL | Status: DC

## 2013-06-01 MED ORDER — OXYCODONE HCL 5 MG PO TABS: 5.0000 mg | ORAL_TABLET | ORAL | Status: DC | PRN

## 2013-06-01 MED ORDER — PREDNISONE 20 MG PO TABS: ORAL_TABLET | ORAL | Status: DC

## 2013-06-01 MED ORDER — WARFARIN SODIUM 7.5 MG PO TABS
7.5000 mg | ORAL_TABLET | Freq: Once | ORAL | Status: DC
  Filled 2013-06-01: qty 1

## 2013-06-01 NOTE — Progress Notes (Signed)
Patient discharged home with wife, discharge instructions given and explained to patient and he verbalized understanding, denies any pain/distress. Accompanied home by wife. No wound, skin intact.

## 2013-06-01 NOTE — Progress Notes (Signed)
Spoke with pt at bedside and his wife via phone concerning home health. They selected [facility] for [facility]. Referral given to in house rep.

## 2013-06-01 NOTE — Progress Notes (Signed)
ANTICOAGULATION CONSULT NOTE - Follow Up Consult  Pharmacy Consult for Heparin Indication: VTE treatment -- LLE DVT  No Known Allergies  Patient Measurements: Height: 6\' 2"  (188 cm) Weight: 279 lb 8.7 oz (126.8 kg) IBW/kg (Calculated) : 82.2 Heparin Dosing Weight:   Vital Signs: Temp: 98 F (36.7 C) (12/01 2008) Temp src: Oral (12/01 2008) BP: 140/90 mmHg (12/01 2124) Pulse Rate: 73 (12/01 2008)  Labs:  Recent Labs  05/30/13 0320  05/31/13 0410 05/31/13 0732 05/31/13 1539 06/01/13 0459  HGB 12.8*  --  12.7*  --   --  13.0  HCT 39.7  --  40.3  --   --  41.2  PLT 118*  --  117*  --   --  123*  LABPROT 20.4*  --  22.4*  --   --  24.1*  INR 1.80*  --  2.04*  --   --  2.24*  HEPARINUNFRC  --   < >  --  0.45 0.44 0.42  CREATININE  --   --  1.46*  --   --   --   < > = values in this interval not displayed.  Estimated Creatinine Clearance: 59.9 ml/min (by C-G formula based on Cr of 1.46).   Medications:  Infusions:  . heparin 1,150 Units/hr (05/31/13 1804)    Assessment: Patient with heparin at goal.  No issues noted per RN.  Goal of Therapy:  Heparin level 0.3-0.5 Monitor platelets by anticoagulation protocol: Yes   Plan:  Continue heparin at current rate, recheck daily.   Darlina Guys, Jacquenette Shone Crowford 06/01/2013,5:34 AM

## 2013-06-01 NOTE — Progress Notes (Signed)
ANTICOAGULATION CONSULT NOTE - Follow Up Consult  Pharmacy Consult for:  Heparin and Coumadin Indication:  VTE treatment -- LLE DVT  No Known Allergies  Patient Measurements: Height: 6\' 2"  (188 cm) Weight: 280 lb 6.8 oz (127.2 kg) IBW/kg (Calculated) : 82.2 Heparin Dosing Weight: 109 kg  Vital Signs: Temp: 97.4 F (36.3 C) (12/02 0544) Temp src: Oral (12/02 0544) BP: 152/104 mmHg (12/02 0544) Pulse Rate: 72 (12/02 0544)  Labs:  Recent Labs  05/30/13 0320  05/31/13 0410 05/31/13 0732 05/31/13 1539 06/01/13 0459  HGB 12.8*  --  12.7*  --   --  13.0  HCT 39.7  --  40.3  --   --  41.2  PLT 118*  --  117*  --   --  123*  LABPROT 20.4*  --  22.4*  --   --  24.1*  INR 1.80*  --  2.04*  --   --  2.24*  HEPARINUNFRC  --   < >  --  0.45 0.44 0.42  CREATININE  --   --  1.46*  --   --   --   < > = values in this interval not displayed.  Estimated Creatinine Clearance: 60.1 ml/min (by C-G formula based on Cr of 1.46).    Assessment: 79 yo M with PMH of atrial fibrillation not on anticoagulation d/t history of chronic hemoptysis with pulmonary fibrosis of unclear etiology, COPD, obstructive sleep apnea on CPAP, who sustained an injury about 5 weeks ago, resulting in a spiral fracture of the distal fibula on the left. Appears he has h/o DVT in past with IVC filter in place. Dopplers reveal subacute DVT LLE, started on VTE treatment with Heparin.  12/2:   D#7 warfarin/heparin bridge  CBC: Chronic thrombocytopenia, stable.  Hgb also low, stable.    Heparin level this AM within the conservative goal range of 0.3-0.5.    INR therapeutic @ 2.24    SCr improved to 1.46, CrCl 60  Monitoring for changes in volume of hemoptysis, per CCM, if increases may have to consider discontinuation of anticoagulation with IVC filter only. At this point the patient is willing to accept the risk/benefit of anticoagulation  Goals of Therapy:   Monitor platelets by anticoagulation protocol:  Yes  INR 2-3   Plan:   Coumadin 7.5 mg PO x 1 tonight  PT/INR daily  D/C heparin and heparin labs  Thank you for the consult.  Haynes Hoehn, PharmD, BCPS Clinical Pharmacist Pager: [phone] 06/01/2013

## 2013-06-01 NOTE — Discharge Summary (Signed)
Physician Discharge Summary  [name] WUJ:[id number] DOB: Feb 24, 1934 DOA: 05/25/2013  PCP: Maurice Blood, MD  Admit date: 05/25/2013 Discharge date: 06/01/2013  Time spent: >30 minutes  Recommendations for Outpatient Follow-up:  Follow-up Information   Follow up with Maurice Share, MD On 06/07/2013. (Appt at 1:45 PM)    Specialty:  Pulmonary Disease   Contact information:   [address] [place] [address] [phone]       Follow up with Maurice Blood, MD. (In 1week call for appt upon discharge)    Specialty:  Internal Medicine   Contact information:   [address] [place] [address] [phone]       Please follow up. (lab/INR per Dr Maurice Delgado in am 12/3)       Please follow up. (Orthopedic M.D., as scheduled)       Discharge Diagnoses:  Principal Problem:   Lower leg DVT (deep venous thromboembolism), acute Active Problems:   OBSTRUCTIVE SLEEP APNEA   COPD (chronic obstructive pulmonary disease)   Chronic systolic dysfunction of left ventricle   Atrial fibrillation   Hemoptysis, unspecified   Pulmonary infiltrates   Dyspnea   CKD (chronic kidney disease) stage 3, GFR 30-59 ml/min   S/P IVC filter   Acute DVT (deep venous thrombosis)   Discharge Condition: Improved/stable  Diet recommendation: Low sodium heart healthy  Filed Weights   05/30/13 0500 05/31/13 0426 06/01/13 0544  Weight: 126.6 kg (279 lb 1.6 oz) 126.8 kg (279 lb 8.7 oz) 127.2 kg (280 lb 6.8 oz)    History of present illness:  Maurice Delgado is a 79 y.o. male with a past medical history of nonischemic cardiomyopathy with EF of about 20-25% based on echocardiogram done in January of this year, history of atrial fibrillation not on anticoagulation, history of chronic hemoptysis with pulmonary infiltrates of unclear etiology, COPD, obstructive sleep apnea on CPAP, who sustained an injury about 5 weeks ago, resulting in a spiral fracture of the distal fibula on the left. Patient  has been followed by Dr. Carola Delgado with orthopedics. A nonsurgical approach was taken and the patient was placed in special boot. He's had decreased ambulation over the last few weeks. He does have a history of a DVT on that left leg and is status post an IVC filter that was placed in 2007 when he was in [place]. His left leg is always swollen, but in the last few weeks he has noticed worsening swelling. He's also noticed some pain. He mentioned this to his orthopedic doctor earlier today when he went for his followup and he underwent a Doppler study, which revealed subacute DVT on that side. Patient denies any chest pains. However, he has been having some shortness of breath over the last couple weeks. This is worse in the last few days. He states he ran out of Symbicort about 2 weeks ago. He is on home oxygen at 2-3 L per minute, which is continuous. Denies any fever or chills. No cough. He was admitted for further evaluation and management.   Hospital Course:  #1 Subacute left lower extremity DVT:  -As discussed above upon admission the Admitting M.D. discussed this pt's complicated scenario with Dr. Myra Delgado with vascular surgery. ED physician had discussed this with the interventional radiology. At this time there is no role for local intervention to that leg. The best course of action would be to try anticoagulation.  -Also discussed patient with Dr. Walker Delgado on 11/26 and he agreed recommends to continue anticoagulation  with heparin and to add Coumadin- noting that patient  Has continue to have hemoptysis but states that still it is much less than what he had in the past  -the Plan was to monitor patient on anticoagulation and if hemoptysis worsened then it would need to be DC'ed  - Dr. Tyson Alias recommended ultrasound of renal duplex unable to completely rule out non-occlusive renal vein thrombosis>> this was done but was inconclusive  -he was monitored closely on  anticoagulation with  Coumadin/heparin as per above plan, and his hemoptysis did  not any worse during his hospitalization and his hemoglobin is remained stable.  -INR therapeutic-2.04 on 12/2 in followup today remains therapeutic at 2.24, the heparin was discontinued today. -I discussed patient with him CCM/Dr. Sung Delgado on call team on 12/1 and the recommendation was that since patient's hemoptysis has not worsened throughout this hospitalization that it's okay for him to be discharged today if his  INR remains therapeutic>> which enhance as discussed above consultation we discharged and is to follow up with his PCP Maurice Delgado who will closely monitor the INR upon discharge. -Patient is also to follow up with Dr. Clance/pulmonology upon discharge for further monitoring/management of his long-standing hemoptysis #2 dyspnea in the setting of COPD, and possible pulmonary fibrosis: He ran out of Symbicort 2 weeks ago. That is probably the reason for his acute decompensation.  -On admission patient was placed on bronchodilators including his Symbicort as well as prednisone which was tapered. he clinically improved and will be discharged on a prednisone taper and is to continue his outpatient bronchodilators.  nebs prn onlyand prednisone>>tapering prednisone  #3 history of nonischemic cardiomyopathy with EF of 20-25%: He has AICD in place. Continue with his home medications.digoxin level is 0.6. He's not an ACE inhibitor probably because of his chronic kidney disease.  -Continue beta blockers  - Continue with Lasix  #4 history of atrial fibrillation: He is has a paced rhythm.   He is to followup with his PCP the #5 history of hemoptysis with pulmonary fibrosis: Patient has refused biopsies in the past. He states that the. Hemoptysis has significantly improved. However, he has noticed occasional dark clots when he coughs.  -Discussed above this was closely monitored during this hospital stay with anticoagulation, he is to follow up  outpatient with Dr. Shelle Delgado- the patient hemoptysis not worsening at this time and hemoglobin stable. #6 history of chronic kidney disease, stage III: Continue monitoring renal function closely.  -Creatinine improved this hospitalization down to 1.46 prior to  discharge  #7 history of obstructive sleep apnea: Utilize CPAP while sleeping.  #8 recent left fibular fracture: Nonsurgical approach was taken. According to the wife, Dr. Carola Delgado told him that the fracture was healing quite well.  -To followup with orthopedics in the office in 3 weeks. HH PT.      Procedures:  None  Consultations:  Pulmonology  Discharge Exam: Filed Vitals:   06/01/13 0929  BP: 146/88  Pulse: 70  Temp:   Resp:    Exam:  General: alert & oriented x 3 In NAD  Cardiovascular: RRR, nl S1 s2  Respiratory: Moderate air movement, no crackles or wheezes  Abdomen: soft +BS NT/ND, no masses palpable  Extremities: decreasing left lower extremity edema, no cyanosis    Discharge Instructions  Discharge Orders   Future Appointments Provider Department Dept Phone   06/07/2013 1:45 PM Maurice Share, MD [facility] [phone]   07/27/2013 10:30 AM Lesleigh Noe, MD  [facility] [address] [phone]   08/04/2013 10:10 AM [facility] [phone]   Future Orders Complete By Expires   Diet - low sodium heart healthy  As directed    Increase activity slowly  As directed        Medication List         aspirin 81 MG tablet  Take 81 mg by mouth every morning.     budesonide-formoterol 160-4.5 MCG/ACT inhaler  Commonly known as:  SYMBICORT  Inhale 2 puffs into the lungs 2 (two) times daily.     calcitRIOL 0.5 MCG capsule  Commonly known as:  ROCALTROL  Take 0.5 mcg by mouth every other day.     digoxin 0.125 MG tablet  Commonly known as:  LANOXIN  Take 125 mcg by mouth every morning.     furosemide 40 MG tablet  Commonly known as:  LASIX   Take 40 mg by mouth every morning.     metoprolol succinate 50 MG 24 hr tablet  Commonly known as:  TOPROL-XL  Take 25 mg by mouth every morning.     omeprazole 40 MG capsule  Commonly known as:  PRILOSEC  Take 1 capsule (40 mg total) by mouth daily.     oxyCODONE 5 MG immediate release tablet  Commonly known as:  Oxy IR/ROXICODONE  Take 1 tablet (5 mg total) by mouth every 4 (four) hours as needed for moderate pain.     PATADAY 0.2 % Soln  Generic drug:  Olopatadine HCl  Place 1 drop into both eyes every morning.     predniSONE 20 MG tablet  Commonly known as:  DELTASONE  Take 2 tablets daily for 3 days, then 1 tablet daily for 3 days and then stop     pregabalin 150 MG capsule  Commonly known as:  LYRICA  Take 150 mg by mouth 2 (two) times daily.     tamsulosin 0.4 MG Caps capsule  Commonly known as:  FLOMAX  Take 0.4 mg by mouth every morning.     tiotropium 18 MCG inhalation capsule  Commonly known as:  SPIRIVA  Place 18 mcg into inhaler and inhale every morning.     ULORIC 80 MG Tabs  Generic drug:  Febuxostat  Take 80 mg by mouth every morning.     vitamin C 1000 MG tablet  Take 1,000 mg by mouth every morning.     warfarin 5 MG tablet  Commonly known as:  COUMADIN  Take 1.5 tablets (7.5 mg total) by mouth one time only at 6 PM.     XOPENEX HFA 45 MCG/ACT inhaler  Generic drug:  levalbuterol  Inhale 2 puffs into the lungs every 6 (six) hours as needed for wheezing.       No Known Allergies     Follow-up Information   Follow up with Maurice Share, MD On 06/07/2013. (Appt at 1:45 PM)    Specialty:  Pulmonary Disease   Contact information:   [address] [place] [address] [phone]       Follow up with Maurice Blood, MD. (In 1week call for appt upon discharge)    Specialty:  Internal Medicine   Contact information:   [address] [place] [address] [phone]       Please follow up. (lab/INR per Dr Maurice Delgado in am 12/3)        Please follow up. (Orthopedic M.D., as scheduled)  The results of significant diagnostics from this hospitalization (including imaging, microbiology, ancillary and laboratory) are listed below for reference.    Significant Diagnostic Studies: Dg Chest 2 View  05/25/2013   CLINICAL DATA:  Shortness of breath.  History of hypertension  EXAM: CHEST  2 VIEW  COMPARISON:  03/10/2013  FINDINGS: The cardiac silhouette is enlarged. A left-sided chest wall AICD unit is identified with lead tips projecting region the right atrium and right ventricle. With variability in technique taking into consideration the stable bilateral interstitial prominence is identified with greatest confluence in the lung bases. No focal regions of consolidation are identified. No new focal infiltrates. There is thickening versus trace fluid along the minor fissure on the right. The osseous structures are unremarkable.  IMPRESSION: 1. Stable chronic interstitial findings likely reflecting pulmonary fibrosis. A superimposed component of pulmonary vascular congestion cannot be excluded clinically appropriate. 2. No new focal regions of consolidation or new focal infiltrates.   Electronically Signed   By: Salome Holmes M.D.   On: 05/25/2013 15:17   Nm Pulmonary Perf And Vent  05/25/2013   CLINICAL DATA:  Short of breath, elevated D-dimer, COPD  EXAM: NUCLEAR MEDICINE VENTILATION - PERFUSION LUNG SCAN  TECHNIQUE: Ventilation images were obtained in multiple projections using inhaled aerosol technetium 99 M DTPA. Perfusion images were obtained in multiple projections after intravenous injection of Tc-60m MAA.  COMPARISON:  Chest radiograph 05/25/2013, CT thorax 10/27/2012  RADIOPHARMACEUTICALS:  Forty-five mCi Tc-30m DTPA aerosol and 5.2 mCi Tc-59m MAA  FINDINGS: Ventilation: There are linear ventilation defects the left and right lung elated to chronic obstructive pulmonary disease. There is a bandlike defect in the right mid  lung.  Perfusion: There no wedge-shaped peripheral perfusion defects. Bandlike perfusion defect in the right mid lung matches the ventilation defect.  IMPRESSION: Very low probability for acute pulmonary embolism.  Ventilation pattern consistent with COPD.   Electronically Signed   By: Genevive Bi M.D.   On: 05/25/2013 16:31    Microbiology: No results found for this or any previous visit (from the past 240 hour(s)).   Labs: Basic Metabolic Panel:  Recent Labs Lab 05/25/13 1335 05/25/13 1342 05/26/13 0155 05/28/13 0343 05/31/13 0410  NA 140 143 140 139 139  K 4.2 3.8 3.5 4.0 3.7  CL 102 102 102 103 102  CO2 28  --  30 27 26   GLUCOSE 96 89 138* 141* 183*  BUN 17 18 18 23 22   CREATININE 1.76* 1.90* 1.73* 1.66* 1.46*  CALCIUM 10.0  --  9.5 9.7 8.8   Liver Function Tests:  Recent Labs Lab 05/26/13 0155  AST 14  ALT 11  ALKPHOS 97  BILITOT 1.1  PROT 6.7  ALBUMIN 3.1*   No results found for this basename: LIPASE, AMYLASE,  in the last 168 hours No results found for this basename: AMMONIA,  in the last 168 hours CBC:  Recent Labs Lab 05/25/13 1335  05/28/13 0343 05/29/13 0402 05/30/13 0320 05/31/13 0410 06/01/13 0459  WBC 4.9  < > 10.8* 10.3 9.6 8.0 9.3  NEUTROABS 3.3  --   --   --   --   --   --   HGB 13.4  < > 12.7* 12.7* 12.8* 12.7* 13.0  HCT 41.2  < > 40.3 40.2 39.7 40.3 41.2  MCV 86.0  < > 85.9 86.1 85.4 85.6 86.2  PLT 123*  < > 131* 123* 118* 117* 123*  < > = values in this interval not displayed.  Cardiac Enzymes:  Recent Labs Lab 05/25/13 1335  TROPONINI <0.30   BNP: BNP (last 3 results)  Recent Labs  07/22/12 1656 05/25/13 1335  PROBNP 4025.0* 1513.0*   CBG: No results found for this basename: GLUCAP,  in the last 168 hours     Signed:  Madysin Crisp C  [facility] 06/01/2013, 1:04 PM

## 2013-06-02 DIAGNOSIS — I82409 Acute embolism and thrombosis of unspecified deep veins of unspecified lower extremity: Secondary | ICD-10-CM | POA: Diagnosis not present

## 2013-06-02 DIAGNOSIS — J8410 Pulmonary fibrosis, unspecified: Secondary | ICD-10-CM | POA: Diagnosis not present

## 2013-06-02 DIAGNOSIS — G4730 Sleep apnea, unspecified: Secondary | ICD-10-CM | POA: Diagnosis not present

## 2013-06-02 DIAGNOSIS — J441 Chronic obstructive pulmonary disease with (acute) exacerbation: Secondary | ICD-10-CM | POA: Diagnosis not present

## 2013-06-02 DIAGNOSIS — I4891 Unspecified atrial fibrillation: Secondary | ICD-10-CM | POA: Diagnosis not present

## 2013-06-02 DIAGNOSIS — I2589 Other forms of chronic ischemic heart disease: Secondary | ICD-10-CM | POA: Diagnosis not present

## 2013-06-07 ENCOUNTER — Ambulatory Visit: Payer: Medicare Other | Admitting: Pulmonary Disease

## 2013-06-08 DIAGNOSIS — J441 Chronic obstructive pulmonary disease with (acute) exacerbation: Secondary | ICD-10-CM | POA: Diagnosis not present

## 2013-06-08 DIAGNOSIS — I4891 Unspecified atrial fibrillation: Secondary | ICD-10-CM | POA: Diagnosis not present

## 2013-06-08 DIAGNOSIS — J8410 Pulmonary fibrosis, unspecified: Secondary | ICD-10-CM | POA: Diagnosis not present

## 2013-06-08 DIAGNOSIS — I2589 Other forms of chronic ischemic heart disease: Secondary | ICD-10-CM | POA: Diagnosis not present

## 2013-06-08 DIAGNOSIS — I82409 Acute embolism and thrombosis of unspecified deep veins of unspecified lower extremity: Secondary | ICD-10-CM | POA: Diagnosis not present

## 2013-06-10 DIAGNOSIS — J441 Chronic obstructive pulmonary disease with (acute) exacerbation: Secondary | ICD-10-CM | POA: Diagnosis not present

## 2013-06-10 DIAGNOSIS — J8410 Pulmonary fibrosis, unspecified: Secondary | ICD-10-CM | POA: Diagnosis not present

## 2013-06-10 DIAGNOSIS — I82409 Acute embolism and thrombosis of unspecified deep veins of unspecified lower extremity: Secondary | ICD-10-CM | POA: Diagnosis not present

## 2013-06-10 DIAGNOSIS — I2589 Other forms of chronic ischemic heart disease: Secondary | ICD-10-CM | POA: Diagnosis not present

## 2013-06-10 DIAGNOSIS — I4891 Unspecified atrial fibrillation: Secondary | ICD-10-CM | POA: Diagnosis not present

## 2013-06-15 ENCOUNTER — Telehealth: Payer: Self-pay | Admitting: Pulmonary Disease

## 2013-06-15 DIAGNOSIS — I2589 Other forms of chronic ischemic heart disease: Secondary | ICD-10-CM | POA: Diagnosis not present

## 2013-06-15 DIAGNOSIS — I4891 Unspecified atrial fibrillation: Secondary | ICD-10-CM | POA: Diagnosis not present

## 2013-06-15 DIAGNOSIS — J8410 Pulmonary fibrosis, unspecified: Secondary | ICD-10-CM | POA: Diagnosis not present

## 2013-06-15 DIAGNOSIS — I82409 Acute embolism and thrombosis of unspecified deep veins of unspecified lower extremity: Secondary | ICD-10-CM | POA: Diagnosis not present

## 2013-06-15 DIAGNOSIS — J441 Chronic obstructive pulmonary disease with (acute) exacerbation: Secondary | ICD-10-CM | POA: Diagnosis not present

## 2013-06-15 NOTE — Telephone Encounter (Signed)
I spoke with Rolly Salter. She is wanting to confirm pt is needing to be on 2-3 liters O2. I advised her per hos d/c note this is correct. Nothing further needed

## 2013-06-17 DIAGNOSIS — J441 Chronic obstructive pulmonary disease with (acute) exacerbation: Secondary | ICD-10-CM | POA: Diagnosis not present

## 2013-06-17 DIAGNOSIS — I4891 Unspecified atrial fibrillation: Secondary | ICD-10-CM | POA: Diagnosis not present

## 2013-06-17 DIAGNOSIS — I2589 Other forms of chronic ischemic heart disease: Secondary | ICD-10-CM | POA: Diagnosis not present

## 2013-06-17 DIAGNOSIS — J8410 Pulmonary fibrosis, unspecified: Secondary | ICD-10-CM | POA: Diagnosis not present

## 2013-06-17 DIAGNOSIS — I82409 Acute embolism and thrombosis of unspecified deep veins of unspecified lower extremity: Secondary | ICD-10-CM | POA: Diagnosis not present

## 2013-06-21 ENCOUNTER — Encounter (HOSPITAL_COMMUNITY): Payer: Self-pay | Admitting: Emergency Medicine

## 2013-06-21 ENCOUNTER — Inpatient Hospital Stay (HOSPITAL_COMMUNITY)
Admission: EM | Admit: 2013-06-21 | Discharge: 2013-06-28 | DRG: 250 | Disposition: A | Discharge status: Home / Self Care | Payer: Medicare Other | Attending: Internal Medicine | Admitting: Internal Medicine

## 2013-06-21 ENCOUNTER — Emergency Department (HOSPITAL_COMMUNITY): Payer: Medicare Other

## 2013-06-21 DIAGNOSIS — J4489 Other specified chronic obstructive pulmonary disease: Secondary | ICD-10-CM | POA: Diagnosis present

## 2013-06-21 DIAGNOSIS — M109 Gout, unspecified: Secondary | ICD-10-CM | POA: Diagnosis present

## 2013-06-21 DIAGNOSIS — Z8249 Family history of ischemic heart disease and other diseases of the circulatory system: Secondary | ICD-10-CM

## 2013-06-21 DIAGNOSIS — E0580 Other thyrotoxicosis without thyrotoxic crisis or storm: Secondary | ICD-10-CM | POA: Diagnosis present

## 2013-06-21 DIAGNOSIS — J9610 Chronic respiratory failure, unspecified whether with hypoxia or hypercapnia: Secondary | ICD-10-CM | POA: Diagnosis present

## 2013-06-21 DIAGNOSIS — K219 Gastro-esophageal reflux disease without esophagitis: Secondary | ICD-10-CM | POA: Diagnosis present

## 2013-06-21 DIAGNOSIS — R55 Syncope and collapse: Secondary | ICD-10-CM

## 2013-06-21 DIAGNOSIS — Z87891 Personal history of nicotine dependence: Secondary | ICD-10-CM

## 2013-06-21 DIAGNOSIS — I428 Other cardiomyopathies: Secondary | ICD-10-CM | POA: Diagnosis present

## 2013-06-21 DIAGNOSIS — T462X5A Adverse effect of other antidysrhythmic drugs, initial encounter: Secondary | ICD-10-CM | POA: Diagnosis present

## 2013-06-21 DIAGNOSIS — Z950 Presence of cardiac pacemaker: Secondary | ICD-10-CM

## 2013-06-21 DIAGNOSIS — I5023 Acute on chronic systolic (congestive) heart failure: Secondary | ICD-10-CM | POA: Diagnosis present

## 2013-06-21 DIAGNOSIS — Z86718 Personal history of other venous thrombosis and embolism: Secondary | ICD-10-CM

## 2013-06-21 DIAGNOSIS — J449 Chronic obstructive pulmonary disease, unspecified: Secondary | ICD-10-CM | POA: Diagnosis present

## 2013-06-21 DIAGNOSIS — N189 Chronic kidney disease, unspecified: Secondary | ICD-10-CM

## 2013-06-21 DIAGNOSIS — I498 Other specified cardiac arrhythmias: Secondary | ICD-10-CM | POA: Diagnosis present

## 2013-06-21 DIAGNOSIS — N183 Chronic kidney disease, stage 3 unspecified: Secondary | ICD-10-CM | POA: Diagnosis present

## 2013-06-21 DIAGNOSIS — G4733 Obstructive sleep apnea (adult) (pediatric): Secondary | ICD-10-CM | POA: Diagnosis present

## 2013-06-21 DIAGNOSIS — Z6835 Body mass index (BMI) 35.0-35.9, adult: Secondary | ICD-10-CM

## 2013-06-21 DIAGNOSIS — R000 Tachycardia, unspecified: Secondary | ICD-10-CM

## 2013-06-21 DIAGNOSIS — I471 Supraventricular tachycardia: Secondary | ICD-10-CM

## 2013-06-21 DIAGNOSIS — I509 Heart failure, unspecified: Secondary | ICD-10-CM | POA: Diagnosis present

## 2013-06-21 DIAGNOSIS — Z9981 Dependence on supplemental oxygen: Secondary | ICD-10-CM

## 2013-06-21 DIAGNOSIS — I129 Hypertensive chronic kidney disease with stage 1 through stage 4 chronic kidney disease, or unspecified chronic kidney disease: Secondary | ICD-10-CM | POA: Diagnosis present

## 2013-06-21 DIAGNOSIS — Z7901 Long term (current) use of anticoagulants: Secondary | ICD-10-CM

## 2013-06-21 DIAGNOSIS — I2789 Other specified pulmonary heart diseases: Secondary | ICD-10-CM | POA: Diagnosis present

## 2013-06-21 DIAGNOSIS — Z7982 Long term (current) use of aspirin: Secondary | ICD-10-CM

## 2013-06-21 DIAGNOSIS — I5022 Chronic systolic (congestive) heart failure: Secondary | ICD-10-CM

## 2013-06-21 DIAGNOSIS — Z79899 Other long term (current) drug therapy: Secondary | ICD-10-CM

## 2013-06-21 DIAGNOSIS — I4891 Unspecified atrial fibrillation: Principal | ICD-10-CM | POA: Diagnosis present

## 2013-06-21 DIAGNOSIS — IMO0002 Reserved for concepts with insufficient information to code with codable children: Secondary | ICD-10-CM

## 2013-06-21 DIAGNOSIS — Z95828 Presence of other vascular implants and grafts: Secondary | ICD-10-CM

## 2013-06-21 DIAGNOSIS — J8410 Pulmonary fibrosis, unspecified: Secondary | ICD-10-CM | POA: Diagnosis present

## 2013-06-21 HISTORY — DX: Supraventricular tachycardia: I47.1

## 2013-06-21 HISTORY — DX: Chronic systolic (congestive) heart failure: I50.22

## 2013-06-21 HISTORY — DX: Obstructive sleep apnea (adult) (pediatric): G47.33

## 2013-06-21 HISTORY — DX: Thyrotoxicosis, unspecified without thyrotoxic crisis or storm: E05.90

## 2013-06-21 HISTORY — DX: Unspecified fracture of shaft of left fibula, initial encounter for closed fracture: S82.402A

## 2013-06-21 HISTORY — DX: Dependence on other enabling machines and devices: Z99.89

## 2013-06-21 HISTORY — DX: Gastro-esophageal reflux disease without esophagitis: K21.9

## 2013-06-21 HISTORY — DX: Hemorrhage, not elsewhere classified: R58

## 2013-06-21 HISTORY — DX: Other supraventricular tachycardia: I47.19

## 2013-06-21 HISTORY — DX: Hemoptysis: R04.2

## 2013-06-21 HISTORY — DX: Paroxysmal atrial fibrillation: I48.0

## 2013-06-21 LAB — CBC WITH DIFFERENTIAL/PLATELET
Basophils Absolute: 0 10*3/uL (ref 0.0–0.1)
Basophils Relative: 0 % (ref 0–1)
Eosinophils Absolute: 0 10*3/uL (ref 0.0–0.7)
Eosinophils Relative: 0 % (ref 0–5)
HCT: 37.5 % — ABNORMAL LOW (ref 39.0–52.0)
Hemoglobin: 11.6 g/dL — ABNORMAL LOW (ref 13.0–17.0)
Lymphocytes Relative: 13 % (ref 12–46)
Lymphs Abs: 0.8 10*3/uL (ref 0.7–4.0)
MCH: 27.7 pg (ref 26.0–34.0)
MCHC: 30.9 g/dL (ref 30.0–36.0)
MCV: 89.5 fL (ref 78.0–100.0)
Monocytes Absolute: 0.5 K/uL (ref 0.1–1.0)
Monocytes Relative: 8 % (ref 3–12)
Neutro Abs: 4.7 10*3/uL (ref 1.7–7.7)
Neutrophils Relative %: 79 % — ABNORMAL HIGH (ref 43–77)
Platelets: 152 10*3/uL (ref 150–400)
RBC: 4.19 MIL/uL — ABNORMAL LOW (ref 4.22–5.81)
RDW: 18.2 % — ABNORMAL HIGH (ref 11.5–15.5)
WBC: 6 K/uL (ref 4.0–10.5)

## 2013-06-21 LAB — TROPONIN I: Troponin I: <0.3 ng/mL (ref <0.30)

## 2013-06-21 LAB — PRO B NATRIURETIC PEPTIDE
Pro B Natriuretic peptide (BNP): 3995 pg/mL — ABNORMAL HIGH (ref 0–450)
Pro B Natriuretic peptide (BNP): 4238 pg/mL — ABNORMAL HIGH (ref 0–450)

## 2013-06-21 LAB — COMPREHENSIVE METABOLIC PANEL
AST: 45 U/L — ABNORMAL HIGH (ref 0–37)
Albumin: 3.1 g/dL — ABNORMAL LOW (ref 3.5–5.2)
Alkaline Phosphatase: 95 U/L (ref 39–117)
BUN: 21 mg/dL (ref 6–23)
Calcium: 9.2 mg/dL (ref 8.4–10.5)
Chloride: 101 mEq/L (ref 96–112)
Creatinine, Ser: 1.79 mg/dL — ABNORMAL HIGH (ref 0.50–1.35)
Potassium: 3.9 mEq/L (ref 3.5–5.1)
Sodium: 138 mEq/L (ref 135–145)
Total Bilirubin: 1.2 mg/dL (ref 0.3–1.2)
Total Protein: 6.7 g/dL (ref 6.0–8.3)

## 2013-06-21 LAB — PROTIME-INR
INR: 3.95 — ABNORMAL HIGH (ref 0.00–1.49)
Prothrombin Time: 37.1 seconds — ABNORMAL HIGH (ref 11.6–15.2)

## 2013-06-21 LAB — COMPREHENSIVE METABOLIC PANEL WITH GFR
ALT: 32 U/L (ref 0–53)
CO2: 28 meq/L (ref 19–32)
GFR calc Af Amer: 40 mL/min — ABNORMAL LOW (ref >90)
GFR calc non Af Amer: 35 mL/min — ABNORMAL LOW (ref >90)
Glucose, Bld: 110 mg/dL — ABNORMAL HIGH (ref 70–99)

## 2013-06-21 LAB — MRSA PCR SCREENING: MRSA by PCR: NEGATIVE

## 2013-06-21 LAB — APTT: aPTT: 57 s — ABNORMAL HIGH (ref 24–37)

## 2013-06-21 MED ORDER — ACETAMINOPHEN 325 MG PO TABS
650.0000 mg | ORAL_TABLET | ORAL | Status: DC | PRN
  Administered 2013-06-22: 650 mg via ORAL
  Filled 2013-06-21: qty 2

## 2013-06-21 MED ORDER — SODIUM CHLORIDE 0.9 % IV SOLN: 250.0000 mL | INTRAVENOUS | Status: DC | PRN

## 2013-06-21 MED ORDER — SODIUM CHLORIDE 0.9 % IJ SOLN
3.0000 mL | INTRAMUSCULAR | Status: DC | PRN
  Administered 2013-06-21: 3 mL via INTRAVENOUS

## 2013-06-21 MED ORDER — SODIUM CHLORIDE 0.9 % IJ SOLN
3.0000 mL | Freq: Two times a day (BID) | INTRAMUSCULAR | Status: DC
  Administered 2013-06-21 – 2013-06-28 (×11): 3 mL via INTRAVENOUS

## 2013-06-21 MED ORDER — DILTIAZEM HCL 100 MG IV SOLR: 5.0000 mg/h | INTRAVENOUS | Status: DC

## 2013-06-21 MED ORDER — SODIUM CHLORIDE 0.9 % IV BOLUS (SEPSIS)
250.0000 mL | Freq: Once | INTRAVENOUS | Status: AC
  Administered 2013-06-21: 250 mL via INTRAVENOUS

## 2013-06-21 MED ORDER — METOPROLOL TARTRATE 1 MG/ML IV SOLN
2.5000 mg | Freq: Once | INTRAVENOUS | Status: AC
  Administered 2013-06-21: 2.5 mg via INTRAVENOUS
  Filled 2013-06-21: qty 5

## 2013-06-21 MED ORDER — FUROSEMIDE 10 MG/ML IJ SOLN
60.0000 mg | Freq: Once | INTRAMUSCULAR | Status: AC
  Administered 2013-06-21: 60 mg via INTRAVENOUS
  Filled 2013-06-21: qty 6

## 2013-06-21 NOTE — ED Notes (Signed)
Per [employer]: Atrial rate today for pt in 120's Pt has had HR greater then 85 at night for last 7 days PT had atrial arrhythmia greater then 170 on 12/6 Pt had nonsustained VT episode on 12/16  All of these results given to MD Ghim. [employer] will fax results as well.

## 2013-06-21 NOTE — ED Notes (Signed)
Pt in via EMS, patient in after a near syncope episode while sitting in the car with his wife, pt had just completed a doctors visit that was a follow up from a recent DVT in his left lower leg, patient BP was low at MD office and patient also had an episode where he urinated on himself in the doctors office which is abnormal for him, pt was sitting in the car and became unresponsive to verbal stimuli but wasn't completed unconscious per wife, episode lasted approx 20 seconds, upon EMS arrival patient was alert and oriented, HR 120s per pacemaker, BP 130/70, IV started PTA

## 2013-06-21 NOTE — ED Notes (Signed)
Pt reports that "I am just not feeling well." Pt went to PCP today for evaluation of left tibia fracture and new bruising to right lower leg. Pt was informed the bruising was dt Coumadin and to be expected and everything was fine. During visit pt had 1 episode of urinary incontence. As pt was getting in car pt had sudden onset of dizziness and fatigue. Pt now denies any complaint but states "I think I am dizzy. I am not really sure."

## 2013-06-21 NOTE — Progress Notes (Signed)
Received from ED. Placed on monitor. VSS. Call bell near. Denies pain or distress at this time. Will monitor.Maurice Delgado

## 2013-06-21 NOTE — ED Provider Notes (Signed)
CSN: [id number]     Arrival date & time 06/21/13  1247 History   First MD Initiated Contact with Patient 06/21/13 1327     Chief Complaint  Patient presents with  . Near Syncope   (Consider location/radiation/quality/duration/timing/severity/associated sxs/prior Treatment) HPI Comments: Pt with h/o CHF, obesity, DVT on coumadin, also h/o atrial fibrillation and pacemaker was being seen at PCP office due to right lower extremity bruising and increase in swelling (known DVT was in left) and while there developed light headedness, dizziness and almost passed out, was sent to the ED for further evaluation.  No CP, no SOB, cough, fevers.  No pleuritic pain.  Denies recent N/V/D.  Is taking coumadin like usual.  Pt is on home O2, was recently admitted and discharged in late November for similar symptoms, found to have acute and subacute DVT of left leg.    Patient is a 79 y.o. male presenting with near-syncope. The history is provided by the patient, medical records and the spouse.  Near Syncope This is a recurrent problem. The current episode started 1 to 2 hours ago. The problem occurs constantly. The problem has been gradually improving. Associated symptoms include shortness of breath. Pertinent negatives include no chest pain and no abdominal pain. Nothing aggravates the symptoms. The symptoms are relieved by rest.    Past Medical History  Diagnosis Date  . Nonischemic cardiomyopathy     with EF 35%, s/p CRT-D device implanted  . Atrial fibrillation   . Unspecified sleep apnea   . HTN (hypertension)   . DVT (deep venous thrombosis)     recurrent. s/p IVC filter.   . Renal insufficiency   . Gout   . COPD (chronic obstructive pulmonary disease)   . CHF (congestive heart failure)    Past Surgical History  Procedure Laterality Date  . Cardiac defibrillator placement      initial BiV ICD 09/22/00 in [place].  Gen change (MDT) by Dr Amil Amen 10/30/09  . Pacemaker insertion    . Video bronchoscopy   02/26/2012    Procedure: VIDEO BRONCHOSCOPY WITHOUT FLUORO;  Surgeon: Barbaraann Share, MD;  Location: [facility];  Service: Cardiopulmonary;  Laterality: Bilateral;  . Foot surgery Right    Family History  Problem Relation Age of Onset  . Heart disease Mother     mother also had Rheumatism.   History  Substance Use Topics  . Smoking status: Former Smoker -- 1.50 packs/day for 30 years    Types: Cigarettes    Quit date: 07/02/1983  . Smokeless tobacco: Never Used  . Alcohol Use: No     Comment: quit after 40 years    Review of Systems  Constitutional: Negative for fever and chills.  Respiratory: Positive for shortness of breath.   Cardiovascular: Positive for leg swelling and near-syncope. Negative for chest pain.  Gastrointestinal: Negative for nausea, vomiting and abdominal pain.  Neurological: Positive for dizziness and light-headedness.       Near syncope  All other systems reviewed and are negative.    Allergies  Review of patient's allergies indicates no known allergies.  Home Medications   Current Outpatient Rx  Name  Route  Sig  Dispense  Refill  . Ascorbic Acid (VITAMIN C) 1000 MG tablet   Oral   Take 1,000 mg by mouth every morning.          Marland Kitchen aspirin 81 MG tablet   Oral   Take 81 mg by mouth every morning.          Marland Kitchen  budesonide-formoterol (SYMBICORT) 160-4.5 MCG/ACT inhaler   Inhalation   Inhale 2 puffs into the lungs 2 (two) times daily.         . calcitRIOL (ROCALTROL) 0.5 MCG capsule   Oral   Take 0.5 mcg by mouth every other day.          . digoxin (LANOXIN) 0.125 MG tablet   Oral   Take 125 mcg by mouth every morning.          . Febuxostat (ULORIC) 80 MG TABS   Oral   Take 80 mg by mouth every morning.          . furosemide (LASIX) 40 MG tablet   Oral   Take 40 mg by mouth every morning.          . levalbuterol (XOPENEX HFA) 45 MCG/ACT inhaler   Inhalation   Inhale 2 puffs into the lungs every 6 (six) hours as needed for  wheezing.          . metoprolol succinate (TOPROL-XL) 50 MG 24 hr tablet   Oral   Take 25 mg by mouth every morning.          . Olopatadine HCl (PATADAY) 0.2 % SOLN   Both Eyes   Place 1 drop into both eyes every morning.         Marland Kitchen omeprazole (PRILOSEC) 40 MG capsule   Oral   Take 1 capsule (40 mg total) by mouth daily.   30 capsule   0   . oxyCODONE (OXY IR/ROXICODONE) 5 MG immediate release tablet   Oral   Take 1 tablet (5 mg total) by mouth every 4 (four) hours as needed for moderate pain.   30 tablet   0   . pregabalin (LYRICA) 150 MG capsule   Oral   Take 150 mg by mouth 2 (two) times daily.           . Tamsulosin HCl (FLOMAX) 0.4 MG CAPS   Oral   Take 0.4 mg by mouth every morning.          . tiotropium (SPIRIVA) 18 MCG inhalation capsule   Inhalation   Place 18 mcg into inhaler and inhale every morning.          . warfarin (COUMADIN) 5 MG tablet   Oral   Take 1.5 tablets (7.5 mg total) by mouth one time only at 6 PM.   45 tablet   0     Lab/INR to be drawn per PCP tomorrow 12/3 and PCP  ...   . predniSONE (DELTASONE) 20 MG tablet      Take 2 tablets daily for 3 days, then 1 tablet daily for 3 days and then stop   9 tablet   0    BP 134/98  Pulse 122  Temp(Src) 97.8 F (36.6 C) (Oral)  Resp 15  SpO2 95% Physical Exam  Nursing note and vitals reviewed. Constitutional: He is oriented to person, place, and time. He appears well-developed and well-nourished. No distress.  HENT:  Head: Normocephalic and atraumatic.  Eyes: Conjunctivae and EOM are normal. No scleral icterus.  Neck: Normal range of motion. Neck supple.  Cardiovascular: Intact distal pulses.  An irregular rhythm present. Tachycardia present.   No murmur heard. Pulmonary/Chest: Effort normal. No respiratory distress. He has no wheezes.  Abdominal: Soft. He exhibits no distension. There is no tenderness. There is no rebound.  Neurological: He is alert and oriented to person,  place, and time. Coordination  normal.  Skin: Skin is warm and dry. He is not diaphoretic.  Psychiatric: He has a normal mood and affect.    ED Course  Procedures (including critical care time) Labs Review Labs Reviewed  CBC WITH DIFFERENTIAL - Abnormal; Notable for the following:    RBC 4.19 (*)    Hemoglobin 11.6 (*)    HCT 37.5 (*)    RDW 18.2 (*)    Neutrophils Relative % 79 (*)    All other components within normal limits  COMPREHENSIVE METABOLIC PANEL - Abnormal; Notable for the following:    Glucose, Bld 110 (*)    Creatinine, Ser 1.79 (*)    Albumin 3.1 (*)    AST 45 (*)    GFR calc non Af Amer 35 (*)    GFR calc Af Amer 40 (*)    All other components within normal limits  APTT - Abnormal; Notable for the following:    aPTT 57 (*)    All other components within normal limits  PROTIME-INR - Abnormal; Notable for the following:    Prothrombin Time 37.1 (*)    INR 3.95 (*)    All other components within normal limits  PRO B NATRIURETIC PEPTIDE - Abnormal; Notable for the following:    Pro B Natriuretic peptide (BNP) 3995.0 (*)    All other components within normal limits  TROPONIN I   Imaging Review Dg Chest Port 1 View  06/21/2013   CLINICAL DATA:  Short of breath.  Weakness.  EXAM: PORTABLE CHEST - 1 VIEW  COMPARISON:  05/25/2013  FINDINGS: Cardiac silhouette is mildly enlarged. Mediastinum is normal in contour left anterior chest wall AICD is stable in well positioned.  Chronic interstitial thickening is stable. No edema or consolidation. No pleural effusion or pneumothorax.  The bony thorax is intact.  IMPRESSION: No acute cardiopulmonary disease.   Electronically Signed   By: Amie Portland M.D.   On: 06/21/2013 14:21    EKG Interpretation    Date/Time:  Monday June 21 2013 13:10:40 EST Ventricular Rate:  120 PR Interval:  129 QRS Duration: 149 QT Interval:  375 QTC Calculation: 530 R Axis:   -98 Text Interpretation:  Sinus tachycardia Premature  ventricular complexes VENTRICULAR PACED RHYTHM Abnormal ekg Confirmed by [facility]  MD, MICHEAL ([id number]) on 06/21/2013 2:22:42 PM           RA sat is 91% and I interpret to be abn low.   3:04 PM After interrogation of pacer, pt has been in sinus tachy at rate >120's today.  Will get labs including troponin and BNP, consider IVF bolus and continue to monitor.  INR to ensure that he is therapeutic.  RLE is swollen, but no pitting edema.  Difficult to compare due to boot on left, but seems symmetric.     3:38 PM INR is therapeutic.  Device interrogation reports pt has been tachycardic for today, may be cause of near syncope episode.  No anemia, normotensive here.  Will give gently IVF bolus.  CXR shows no infiltrate, sig edema.  Troponin is normal.  Will ask for cardiology consultation, likely can be discharged if cleared and if HR improves.  Pt feels symptomatically improved.  If pt has further concerns regarding pacemaker, or if he remains tachycardic, pt may need admission.   MDM   1. Near syncope   2. Sinus tachycardia      No wheezing on exam, no CP then or currently.  ECG shows paced venrticular rhythm, likely  sinus tachycardia or possibly atrial fib.  Will obtain interrogation of pacemaker to see if new change is cause of symptoms.  Pt reportedly had labs done today and his coumadin is therapeutic per spouse, however no labs or PCP note is in [facility] currently.  Will recheck labs, PT/INR, and continue to monitor in the ED.  Pt reports feeling relatively improved overall, no CP, no sig SOB currently.     Gavin Pound. Vivan Agostino, MD 06/21/13 1547

## 2013-06-21 NOTE — ED Notes (Signed)
[facility] at bedside.

## 2013-06-21 NOTE — ED Notes (Signed)
Per Dr Oletta Lamas, concerned pt is in A Fib with RVR. Hr remains in 120's. Pt denies any CP.

## 2013-06-21 NOTE — ED Notes (Signed)
Cardiology at bedside.

## 2013-06-21 NOTE — Consult Note (Addendum)
ELECTROPHYSIOLOGY CONSULT NOTE  Patient ID: Maurice Delgado MRN: [id number], DOB/AGE: 07/24/1933   Admit date: 06/21/2013 Date of Consult: 06/21/2013  Primary Physician: [name], MD Primary Cardiologist: Verdis Prime, MD Primary EP: Hillis Range, MD Reason for Consultation: Near syncope  History of Present Illness Maurice Delgado is a 79 y.o. 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 He was at his primary MD's office when he experienced Sob and felt like he would pass out. In the ED he had Atrial tachy at 120/min. He does not feel palpitations. He has chronic peripheral edema.  Past Medical History Past Medical History  Diagnosis Date  . Nonischemic cardiomyopathy     with EF 35%, s/p CRT-D device implanted  . Atrial fibrillation   . Unspecified sleep apnea   . HTN (hypertension)   . DVT (deep venous thrombosis)     recurrent. s/p IVC filter.   . Renal insufficiency   . Gout   . COPD (chronic obstructive pulmonary disease)   . CHF (congestive heart failure)     Past Surgical History Past Surgical History  Procedure Laterality Date  . Cardiac defibrillator placement      initial BiV ICD 09/22/00 in [place].  Gen change (MDT) by Dr Amil Amen 10/30/09  . Pacemaker insertion    . Video bronchoscopy  02/26/2012    Procedure: VIDEO BRONCHOSCOPY WITHOUT FLUORO;  Surgeon: Barbaraann Share, MD;  Location: [facility];  Service: Cardiopulmonary;  Laterality: Bilateral;  . Foot surgery Right     Allergies/Intolerances No Known Allergies  Current Home Medications      aspirin 81 MG tablet  Take 81 mg by mouth every morning.     budesonide-formoterol 160-4.5 MCG/ACT inhaler  Commonly known as:  SYMBICORT  Inhale 2 puffs into  the lungs 2 (two) times daily.     calcitRIOL 0.5 MCG capsule  Commonly known as:  ROCALTROL  Take 0.5 mcg by mouth every other day.     digoxin 0.125 MG tablet  Commonly known as:  LANOXIN  Take 125 mcg by mouth every morning.     furosemide 40 MG tablet  Commonly known as:  LASIX  Take 40 mg by mouth every morning.     metoprolol succinate 50 MG 24 hr tablet  Commonly known as:  TOPROL-XL  Take 25 mg by mouth every morning.     omeprazole 40 MG capsule  Commonly known as:  PRILOSEC  Take 1 capsule (40 mg total) by mouth daily.     oxyCODONE 5 MG immediate release tablet  Commonly known as:  Oxy IR/ROXICODONE  Take 1 tablet (5 mg total) by mouth every 4 (four) hours as needed for moderate pain.     PATADAY 0.2 % Soln  Generic drug:  Olopatadine HCl  Place 1 drop into both eyes every morning.     predniSONE 20 MG tablet  Commonly known as:  DELTASONE  Take 2 tablets daily for 3 days, then 1 tablet daily for 3 days and then stop     pregabalin 150 MG capsule  Commonly known as:  LYRICA  Take 150 mg by mouth 2 (two) times daily.     tamsulosin 0.4 MG Caps capsule  Commonly known as:  FLOMAX  Take 0.4 mg  by mouth every morning.     tiotropium 18 MCG inhalation capsule  Commonly known as:  SPIRIVA  Place 18 mcg into inhaler and inhale every morning.     ULORIC 80 MG Tabs  Generic drug:  Febuxostat  Take 80 mg by mouth every morning.     vitamin C 1000 MG tablet  Take 1,000 mg by mouth every morning.     warfarin 5 MG tablet  Commonly known as:  COUMADIN  Take 1.5 tablets (7.5 mg total) by mouth one time only at 6 PM.     XOPENEX HFA 45 MCG/ACT inhaler  Generic drug:  levalbuterol  Inhale 2 puffs into the lungs every 6 (six) hours as needed for wheezing.     Family History Family History  Problem Relation Age of Onset  . Heart disease Mother     mother also had Rheumatism.     Social History History   Social History  . Marital Status: Married     Spouse Name: N/A    Number of Children: N/A  . Years of Education: N/A   Occupational History  . reitred     from education   Social History Main Topics  . Smoking status: Former Smoker -- 1.50 packs/day for 30 years    Types: Cigarettes    Quit date: 07/02/1983  . Smokeless tobacco: Never Used  . Alcohol Use: No     Comment: quit after 40 years  . Drug Use: No     Comment: quit useing marajuna after 40 years   . Sexual Activity: Not on file   Other Topics Concern  . Not on file   Social History Narrative   Married and has 1 son.    Lives in [place].  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     Review of Systems General: No chills, fever, night sweats or weight changes  Cardiovascular:  No chest pain, dyspnea on exertion, edema, orthopnea, palpitations, paroxysmal nocturnal dyspnea Dermatological: No rash, lesions or masses Respiratory: No cough, dyspnea Urologic: No hematuria, dysuria Abdominal: No nausea, vomiting, diarrhea, bright red blood per rectum, melena, or hematemesis Neurologic: No visual changes, weakness, changes in mental status All other systems reviewed and are otherwise negative except as noted above.  Physical Exam Vitals: Blood pressure 128/96, pulse 122, temperature 97.8 F (36.6 C), temperature source Oral, resp. rate 14, SpO2 90.00%.  General: Well developed, well appearing 79 y.o. male in no acute distress. HEENT: Normocephalic, atraumatic. EOMs intact. Sclera nonicteric. Oropharynx clear.  Neck: Supple without bruits. No JVD. Lungs: Respirations regular and unlabored, CTA bilaterally. No wheezes, rales or rhonchi. Heart: RRR. S1, S2 present. No murmurs, rub, S3 or S4. Abdomen: Soft, non-tender, non-distended. BS present x 4 quadrants. No hepatosplenomegaly.  Extremities: No clubbing, cyanosis, 3+ peripheral edema. DP/PT/Radials 2+ and equal bilaterally. Psych: Normal affect. Neuro: Alert and oriented X 3. Moves all extremities  spontaneously. Musculoskeletal: No kyphosis. Skin: Intact. Warm and dry. No rashes or petechiae in exposed areas.   Labs  Recent Labs  06/21/13 1445  TROPONINI <0.30   Lab Results  Component Value Date   WBC 6.0 06/21/2013   HGB 11.6* 06/21/2013   HCT 37.5* 06/21/2013   MCV 89.5 06/21/2013   PLT 152 06/21/2013    Recent Labs Lab 06/21/13 1445  NA 138  K 3.9  CL 101  CO2 28  BUN 21  CREATININE 1.79*  CALCIUM 9.2  PROT 6.7  BILITOT 1.2  ALKPHOS 95  ALT 32  AST 45*  GLUCOSE 110*    Recent Labs  06/21/13 1445  INR 3.95*    Radiology/Studies Dg Chest Port 1 View 06/21/2013   CLINICAL DATA:  Short of breath.  Weakness.  EXAM: PORTABLE CHEST - 1 VIEW  COMPARISON:  05/25/2013  FINDINGS: Cardiac silhouette is mildly enlarged. Mediastinum is normal in contour left anterior chest wall AICD is stable in well positioned.  Chronic interstitial thickening is stable. No edema or consolidation. No pleural effusion or pneumothorax.  The bony thorax is intact.   IMPRESSION: No acute cardiopulmonary disease.    Electronically Signed   By: Amie Portland M.D.   On: 06/21/2013 14:21   Echocardiogram Study Conclusions - Left ventricle: The cavity size was moderately dilated. Systolic function was severely reduced. The estimated ejection fraction was in the range of 20% to 25%. Diffuse hypokinesis. Severe hypokinesis. - Ventricular septum: Septal motion showed paradox. - Aortic valve: Mild regurgitation. - Mitral valve: Mild regurgitation. - Left atrium: The atrium was mildly dilated. - Atrial septum: No defect or patent foramen ovale was identified. - Pulmonary arteries: PA peak pressure: 42mm Hg (S). Impression: - The right ventricular systolic pressure was increased consistent with mild pulmonary hypertension.  12-lead ECG - V paced at 120 bpm Device interrogation -   Assessment and Plan 1. Near syncope 2. Atrial tachycardia 3. Atrial fibrillation 4. NICM, EF 35%,  s/p CRT-D implant 5. Chronic systolic HF 6. Chronic respiratory due to COPD on home oxygen 7. Acute respiratory failure due to uncontrolled atrial tachy, in setting of all of the above. Limmie Patricia, PA-C 06/21/2013, 4:43 PM  EP attending  Patient seen and examined. I have amended the note as appropriate. I suspect his sob and near syncope due to atrial tachycardia and BiV pacing at upper rate. He has volume overload on exam. I have recommended admission for observation with IV lasix. He is now therapeutic and will need to continue his Warfarin. Options for treatment of Atrial tachy are limited with IVC filter in place. If he has recurrent SVT would consider adding sotalol although his CHF makes this medication suboptimal. As he had thyroid dysfunction on amio and is morbidly obese, amio is not a good choice.  Lewayne Bunting, M.D.

## 2013-06-22 ENCOUNTER — Other Ambulatory Visit: Payer: Self-pay

## 2013-06-22 LAB — BASIC METABOLIC PANEL
CO2: 29 mEq/L (ref 19–32)
Calcium: 9.1 mg/dL (ref 8.4–10.5)
Chloride: 103 mEq/L (ref 96–112)
Creatinine, Ser: 1.71 mg/dL — ABNORMAL HIGH (ref 0.50–1.35)
Potassium: 4.3 mEq/L (ref 3.5–5.1)

## 2013-06-22 LAB — TROPONIN I
Troponin I: <0.3 ng/mL (ref <0.30)
Troponin I: <0.3 ng/mL (ref <0.30)

## 2013-06-22 LAB — TSH: TSH: 0.647 u[IU]/mL (ref 0.350–4.500)

## 2013-06-22 LAB — PROTIME-INR: Prothrombin Time: 36.2 seconds — ABNORMAL HIGH (ref 11.6–15.2)

## 2013-06-22 MED ORDER — TIOTROPIUM BROMIDE MONOHYDRATE 18 MCG IN CAPS
18.0000 ug | ORAL_CAPSULE | Freq: Every morning | RESPIRATORY_TRACT | Status: DC
Start: 2013-06-22 — End: 2013-06-28
  Administered 2013-06-22 – 2013-06-28 (×7): 18 ug via RESPIRATORY_TRACT
  Filled 2013-06-22 (×4): qty 5

## 2013-06-22 MED ORDER — FUROSEMIDE 40 MG PO TABS
40.0000 mg | ORAL_TABLET | Freq: Every morning | ORAL | Status: DC
  Administered 2013-06-22: 40 mg via ORAL
  Filled 2013-06-22 (×2): qty 1

## 2013-06-22 MED ORDER — LEVALBUTEROL TARTRATE 45 MCG/ACT IN AERO
2.0000 | INHALATION_SPRAY | Freq: Four times a day (QID) | RESPIRATORY_TRACT | Status: DC | PRN
  Administered 2013-06-23: 2 via RESPIRATORY_TRACT
  Filled 2013-06-22: qty 15

## 2013-06-22 MED ORDER — WARFARIN SODIUM 7.5 MG PO TABS: 7.5000 mg | ORAL_TABLET | Freq: Once | ORAL | Status: DC

## 2013-06-22 MED ORDER — TAMSULOSIN HCL 0.4 MG PO CAPS
0.4000 mg | ORAL_CAPSULE | Freq: Every morning | ORAL | Status: DC
  Administered 2013-06-22 – 2013-06-28 (×7): 0.4 mg via ORAL
  Filled 2013-06-22 (×7): qty 1

## 2013-06-22 MED ORDER — BUDESONIDE-FORMOTEROL FUMARATE 160-4.5 MCG/ACT IN AERO
2.0000 | INHALATION_SPRAY | Freq: Two times a day (BID) | RESPIRATORY_TRACT | Status: DC
  Administered 2013-06-22 – 2013-06-28 (×13): 2 via RESPIRATORY_TRACT
  Filled 2013-06-22 (×3): qty 6

## 2013-06-22 MED ORDER — WARFARIN - PHARMACIST DOSING INPATIENT: Freq: Every day | Status: DC

## 2013-06-22 MED ORDER — OXYCODONE HCL 5 MG PO TABS: 5.0000 mg | ORAL_TABLET | ORAL | Status: DC | PRN

## 2013-06-22 MED ORDER — PREGABALIN 50 MG PO CAPS
150.0000 mg | ORAL_CAPSULE | Freq: Two times a day (BID) | ORAL | Status: DC
  Administered 2013-06-22 – 2013-06-28 (×12): 150 mg via ORAL
  Filled 2013-06-22 (×12): qty 3

## 2013-06-22 MED ORDER — SOTALOL HCL 80 MG PO TABS
160.0000 mg | ORAL_TABLET | Freq: Every day | ORAL | Status: DC
  Administered 2013-06-22 – 2013-06-28 (×7): 160 mg via ORAL
  Filled 2013-06-22 (×7): qty 2

## 2013-06-22 MED ORDER — ASPIRIN 81 MG PO CHEW
81.0000 mg | CHEWABLE_TABLET | Freq: Every morning | ORAL | Status: DC
  Administered 2013-06-22 – 2013-06-28 (×7): 81 mg via ORAL
  Filled 2013-06-22 (×9): qty 1

## 2013-06-22 MED ORDER — METOPROLOL SUCCINATE ER 25 MG PO TB24
25.0000 mg | ORAL_TABLET | Freq: Every morning | ORAL | Status: DC
  Administered 2013-06-22 – 2013-06-28 (×7): 25 mg via ORAL
  Filled 2013-06-22 (×7): qty 1

## 2013-06-22 MED ORDER — FEBUXOSTAT 40 MG PO TABS
80.0000 mg | ORAL_TABLET | Freq: Every morning | ORAL | Status: DC
  Administered 2013-06-22 – 2013-06-28 (×7): 80 mg via ORAL
  Filled 2013-06-22 (×7): qty 2

## 2013-06-22 MED ORDER — CALCITRIOL 0.5 MCG PO CAPS
0.5000 ug | ORAL_CAPSULE | ORAL | Status: DC
  Administered 2013-06-22 – 2013-06-28 (×4): 0.5 ug via ORAL
  Filled 2013-06-22 (×4): qty 1

## 2013-06-22 MED ORDER — PANTOPRAZOLE SODIUM 40 MG PO TBEC
40.0000 mg | DELAYED_RELEASE_TABLET | Freq: Every day | ORAL | Status: DC
  Administered 2013-06-22 – 2013-06-28 (×7): 40 mg via ORAL
  Filled 2013-06-22 (×6): qty 1

## 2013-06-22 NOTE — Progress Notes (Signed)
Pt. Was placed on CPAP auto titrate (Min: 5, Max: 15) with 2L O2 bled in via FFM (what pt. Wears at home) Pt. Is unaware of home settings. Pt. Is tolerating CPAP well at this time without any complications.

## 2013-06-22 NOTE — Progress Notes (Signed)
ANTICOAGULATION CONSULT NOTE - Initial Consult  Pharmacy Consult for Coumadin Indication: DVT  No Known Allergies  Patient Measurements: Height: 6\' 3"  (190.5 cm) Weight: 279 lb 15.8 oz (127 kg) IBW/kg (Calculated) : 84.5 Heparin Dosing Weight: n/a  Vital Signs: Temp: 98.7 F (37.1 C) (12/23 0607) Temp src: Oral (12/23 0607) BP: 120/74 mmHg (12/23 0607) Pulse Rate: 129 (12/23 0607)  Labs:  Recent Labs  06/21/13 1445 06/21/13 1943 06/22/13 0130 06/22/13 0747  HGB 11.6*  --   --   --   HCT 37.5*  --   --   --   PLT 152  --   --   --   APTT 57*  --   --   --   LABPROT 37.1*  --  36.2*  --   INR 3.95*  --  3.83*  --   CREATININE 1.79*  --  1.71*  --   TROPONINI <0.30 <0.30 <0.30 <0.30    Estimated Creatinine Clearance: 51.9 ml/min (by C-G formula based on Cr of 1.71).   Medical History: Past Medical History  Diagnosis Date  . Nonischemic cardiomyopathy     with EF 35%, s/p CRT-D device implanted  . HTN (hypertension)   . DVT (deep venous thrombosis)     recurrent. s/p IVC filter.   . Gout   . COPD (chronic obstructive pulmonary disease)   . CHF (congestive heart failure)   . Automatic implantable cardioverter-defibrillator in situ   . Atrial fibrillation   . GERD (gastroesophageal reflux disease)   . Left tibial fracture     distal/notes 05/25/2013 (06/21/2013)  . Hemoptysis     Hattie Perch 05/25/2013 (06/21/2013)  . OSA on CPAP   . Chronic kidney disease (CKD), stage III (moderate)     Hattie Perch 05/25/2013 (06/21/2013)  . On home oxygen therapy     "2-3L 24/7" (06/21/2013)    Medications:  Prescriptions prior to admission  Medication Sig Dispense Refill  . Ascorbic Acid (VITAMIN C) 1000 MG tablet Take 1,000 mg by mouth every morning.       Marland Kitchen aspirin 81 MG tablet Take 81 mg by mouth every morning.       . budesonide-formoterol (SYMBICORT) 160-4.5 MCG/ACT inhaler Inhale 2 puffs into the lungs 2 (two) times daily.      . calcitRIOL (ROCALTROL) 0.5 MCG capsule  Take 0.5 mcg by mouth every other day.       . digoxin (LANOXIN) 0.125 MG tablet Take 125 mcg by mouth every morning.       . Febuxostat (ULORIC) 80 MG TABS Take 80 mg by mouth every morning.       . furosemide (LASIX) 40 MG tablet Take 40 mg by mouth every morning.       . levalbuterol (XOPENEX HFA) 45 MCG/ACT inhaler Inhale 2 puffs into the lungs every 6 (six) hours as needed for wheezing.       . metoprolol succinate (TOPROL-XL) 50 MG 24 hr tablet Take 25 mg by mouth every morning.       . Olopatadine HCl (PATADAY) 0.2 % SOLN Place 1 drop into both eyes every morning.      Marland Kitchen omeprazole (PRILOSEC) 40 MG capsule Take 1 capsule (40 mg total) by mouth daily.  30 capsule  0  . oxyCODONE (OXY IR/ROXICODONE) 5 MG immediate release tablet Take 1 tablet (5 mg total) by mouth every 4 (four) hours as needed for moderate pain.  30 tablet  0  . pregabalin (LYRICA) 150 MG  capsule Take 150 mg by mouth 2 (two) times daily.        . Tamsulosin HCl (FLOMAX) 0.4 MG CAPS Take 0.4 mg by mouth every morning.       . tiotropium (SPIRIVA) 18 MCG inhalation capsule Place 18 mcg into inhaler and inhale every morning.       . warfarin (COUMADIN) 5 MG tablet Take 1.5 tablets (7.5 mg total) by mouth one time only at 6 PM.  45 tablet  0  . predniSONE (DELTASONE) 20 MG tablet Take 2 tablets daily for 3 days, then 1 tablet daily for 3 days and then stop  9 tablet  0    Assessment: 46 YOM with h/o Afib who sustained a spiral fracture of left distal tibula 5 weeks ago and was placed in a special boot. He noticed worsening swelling in left leg with pain and had a Doppler study which revealed a DVT on LLE. He was started on coumadin at [facility]. Will continue dosing coumadin here. INR supratherapeutic at 3.83. No unusual bleeding noted. H/H 11.6/37.5, Plt wnl.  Goal of Therapy:  INR 2-3 Monitor platelets by anticoagulation protocol: Yes   Plan:  1) Hold coumadin today 2) F/u daily INR, CBC  3) Monitor for s/s of  bleeding    Vinnie Level, PharmD.  Clinical Pharmacist Pager [phone]

## 2013-06-22 NOTE — Progress Notes (Signed)
SUBJECTIVE: Patient SOB with minimal exertion.   I/O - , weight down 4 pounds from yesterday  CURRENT MEDICATIONS: . aspirin  81 mg Oral q morning - 10a  . budesonide-formoterol  2 puff Inhalation BID  . calcitRIOL  0.5 mcg Oral QODAY  . febuxostat  80 mg Oral q morning - 10a  . furosemide  40 mg Oral q morning - 10a  . metoprolol succinate  25 mg Oral q morning - 10a  . pantoprazole  40 mg Oral Daily  . pregabalin  150 mg Oral BID  . sodium chloride  3 mL Intravenous Q12H  . tamsulosin  0.4 mg Oral q morning - 10a  . tiotropium  18 mcg Inhalation q morning - 10a      OBJECTIVE: Physical Exam: Filed Vitals:   06/21/13 1817 06/21/13 1941 06/22/13 0015 06/22/13 0607  BP: 128/96 107/76  120/74  Pulse: 131 123 108 129  Temp: 97.7 F (36.5 C) 97.7 F (36.5 C)  98.7 F (37.1 C)  TempSrc: Oral Oral  Oral  Resp: 16 16 16 18   Height: 6\' 3"  (1.905 m)     Weight: 283 lb 8 oz (128.595 kg)   279 lb 15.8 oz (127 kg)  SpO2: 90% 94% 96% 90%    Intake/Output Summary (Last 24 hours) at 06/22/13 0940 Last data filed at 06/22/13 1610  Gross per 24 hour  Intake      3 ml  Output   1000 ml  Net   -997 ml    Telemetry reveals sinus rhythm with ventricular pacing, intermittent atrial tach with ventricular pacing and Wenckebach   GEN- The patient is well appearing, alert and oriented x 3 today.   Neck- supple, no JVP Lymph- no cervical lymphadenopathy Lungs- Clear to ausculation bilaterally, normal work of breathing Heart- Regular rate and rhythm, no murmurs, rubs or gallops, PMI not laterally displaced GI- soft, NT, ND, + BS Extremities- no clubbing, cyanosis, 3+ edema Skin- no rash or lesion Psych- euthymic mood, full affect Neuro- strength and sensation are intact  LABS: Basic Metabolic Panel:  Recent Labs  96/04/54 1445 06/22/13 0130  NA 138 142  K 3.9 4.3  CL 101 103  CO2 28 29  GLUCOSE 110* 134*  BUN 21 23  CREATININE 1.79* 1.71*  CALCIUM 9.2 9.1   Liver  Function Tests:  Recent Labs  06/21/13 1445  AST 45*  ALT 32  ALKPHOS 95  BILITOT 1.2  PROT 6.7  ALBUMIN 3.1*   CBC:  Recent Labs  06/21/13 1445  WBC 6.0  NEUTROABS 4.7  HGB 11.6*  HCT 37.5*  MCV 89.5  PLT 152   Cardiac Enzymes:  Recent Labs  06/21/13 1943 06/22/13 0130 06/22/13 0747  TROPONINI <0.30 <0.30 <0.30   Thyroid Function Tests:  Recent Labs  06/21/13 1943  TSH 0.647    RADIOLOGY: Dg Chest Port 1 View 06/21/2013   CLINICAL DATA:  Short of breath.  Weakness.  EXAM: PORTABLE CHEST - 1 VIEW  COMPARISON:  05/25/2013  FINDINGS: Cardiac silhouette is mildly enlarged. Mediastinum is normal in contour left anterior chest wall AICD is stable in well positioned.  Chronic interstitial thickening is stable. No edema or consolidation. No pleural effusion or pneumothorax.  The bony thorax is intact.  IMPRESSION: No acute cardiopulmonary disease.   Electronically Signed   By: Amie Portland M.D.   On: 06/21/2013 14:21    ASSESSMENT AND PLAN:  Active Problems:   Near syncope He has recurrent  and incessant SVT (likely left atrial tachycardia based on axis of p waves during SVT. As he has not tolerated amiodarone in the past, will start sotalol 160 daily ( renal insufficiency) and observe. Will continue metoprolol.  Leonia Reeves.D.

## 2013-06-23 LAB — PROTIME-INR
INR: 2.66 — ABNORMAL HIGH (ref 0.00–1.49)
Prothrombin Time: 27.4 seconds — ABNORMAL HIGH (ref 11.6–15.2)

## 2013-06-23 LAB — BASIC METABOLIC PANEL
BUN: 25 mg/dL — ABNORMAL HIGH (ref 6–23)
CO2: 30 mEq/L (ref 19–32)
Chloride: 102 mEq/L (ref 96–112)
Creatinine, Ser: 1.73 mg/dL — ABNORMAL HIGH (ref 0.50–1.35)
GFR calc Af Amer: 42 mL/min — ABNORMAL LOW (ref >90)
Potassium: 3.8 mEq/L (ref 3.5–5.1)

## 2013-06-23 MED ORDER — WARFARIN SODIUM 5 MG PO TABS
5.0000 mg | ORAL_TABLET | Freq: Once | ORAL | Status: AC
  Administered 2013-06-23: 5 mg via ORAL
  Filled 2013-06-23: qty 1

## 2013-06-23 MED ORDER — FUROSEMIDE 10 MG/ML IJ SOLN
80.0000 mg | Freq: Once | INTRAMUSCULAR | Status: AC
  Administered 2013-06-23: 80 mg via INTRAVENOUS
  Filled 2013-06-23: qty 8

## 2013-06-23 NOTE — Progress Notes (Signed)
Pt. Was placed on CPAP auto titrate (Min: 5, Max: 15) with 2L O2 bled in via FFM (what pt. Wears at home) Pt. Is tolerating CPAP well at this time without any complications.

## 2013-06-23 NOTE — Progress Notes (Signed)
Pharmacist Heart Failure Core Measure Documentation  Assessment: Maurice Delgado has an EF documented as 20-25% on 07/24/2012 by ECHO.  Rationale: Heart failure patients with left ventricular systolic dysfunction (LVSD) and an EF < 40% should be prescribed an angiotensin converting enzyme inhibitor (ACEI) or angiotensin receptor blocker (ARB) at discharge unless a contraindication is documented in the medical record.  This patient is not currently on an ACEI or ARB for HF.  This note is being placed in the record in order to provide documentation that a contraindication to the use of these agents is present for this encounter.  ACE Inhibitor or Angiotensin Receptor Blocker is contraindicated (specify all that apply)  []   ACEI allergy AND ARB allergy []   Angioedema []   Moderate or severe aortic stenosis []   Hyperkalemia []   Hypotension []   Renal artery stenosis [x]   Worsening renal function, preexisting renal disease or dysfunction   Vinnie Level, PharmD.  Clinical Pharmacist Pager [phone]

## 2013-06-23 NOTE — Progress Notes (Signed)
ANTICOAGULATION CONSULT NOTE - Follow up Consult  Pharmacy Consult for Coumadin Indication: DVT  No Known Allergies  Patient Measurements: Height: 6\' 3"  (190.5 cm) Weight: 281 lb 15.5 oz (127.9 kg) (scale A) IBW/kg (Calculated) : 84.5 Heparin Dosing Weight: n/a  Vital Signs: Temp: 97.7 F (36.5 C) (12/24 0422) Temp src: Oral (12/24 0422) BP: 127/82 mmHg (12/24 0422) Pulse Rate: 69 (12/24 0422)  Labs:  Recent Labs  06/21/13 1445 06/21/13 1943 06/22/13 0130 06/22/13 0747 06/23/13 0441  HGB 11.6*  --   --   --   --   HCT 37.5*  --   --   --   --   PLT 152  --   --   --   --   APTT 57*  --   --   --   --   LABPROT 37.1*  --  36.2*  --  27.4*  INR 3.95*  --  3.83*  --  2.66*  CREATININE 1.79*  --  1.71*  --  1.73*  TROPONINI <0.30 <0.30 <0.30 <0.30  --     Estimated Creatinine Clearance: 51.5 ml/min (by C-G formula based on Cr of 1.73).   Assessment: 84 YOM with h/o of AFib on coumadin for new DVT. INR currently therapeutic at 2.66. No unusual bleeding noted.   Goal of Therapy:  INR 2-3 Monitor platelets by anticoagulation protocol: Yes   Plan:  1) Coumadin 5 mg x 1 dose tonight 2) F/u daily INR and AM CBC  3) Monitor for s/s of bleeding    Vinnie Level, PharmD.  Clinical Pharmacist Pager [phone]

## 2013-06-23 NOTE — Progress Notes (Signed)
SUBJECTIVE: Patient SOB with minimal exertion.   No further atrial tach since Sotalol started yesterday.   I/O equal yesterday, ?accuracy of weights.   CURRENT MEDICATIONS: . aspirin  81 mg Oral q morning - 10a  . budesonide-formoterol  2 puff Inhalation BID  . calcitRIOL  0.5 mcg Oral QODAY  . febuxostat  80 mg Oral q morning - 10a  . furosemide  40 mg Oral q morning - 10a  . metoprolol succinate  25 mg Oral q morning - 10a  . pantoprazole  40 mg Oral Daily  . pregabalin  150 mg Oral BID  . sodium chloride  3 mL Intravenous Q12H  . sotalol  160 mg Oral Daily  . tamsulosin  0.4 mg Oral q morning - 10a  . tiotropium  18 mcg Inhalation q morning - 10a  . Warfarin - Pharmacist Dosing Inpatient   Does not apply q1800      OBJECTIVE: Physical Exam: Filed Vitals:   06/22/13 1300 06/22/13 2105 06/23/13 0015 06/23/13 0422  BP: 101/72 119/81  127/82  Pulse: 84 73 88 69  Temp: 97.3 F (36.3 C) 98 F (36.7 C)  97.7 F (36.5 C)  TempSrc: Oral Oral  Oral  Resp: 18 18 16 18   Height:      Weight:    281 lb 15.5 oz (127.9 kg)  SpO2: 95% 93% 95% 92%    Intake/Output Summary (Last 24 hours) at 06/23/13 0454 Last data filed at 06/22/13 1900  Gross per 24 hour  Intake    840 ml  Output    800 ml  Net     40 ml    Telemetry reveals sinus rhythm with ventricular pacing, no further atrial tach since Sotalol started yesterday  GEN- The patient is well appearing, alert and oriented x 3 today.   Neck- supple, no JVP Lungs- Clear to ausculation bilaterally, normal work of breathing Heart- Regular rate and rhythm, no murmurs, rubs or gallops, PMI not laterally displaced GI- soft, NT, ND, + BS Extremities- no clubbing, cyanosis, 3+ edema Skin- no rash or lesion Neuro- strength and sensation are intact  LABS: Basic Metabolic Panel:  Recent Labs  09/81/19 0130 06/23/13 0441  NA 142 140  K 4.3 3.8  CL 103 102  CO2 29 30  GLUCOSE 134* 121*  BUN 23 25*  CREATININE 1.71*  1.73*  CALCIUM 9.1 9.1   Liver Function Tests:  Recent Labs  06/21/13 1445  AST 45*  ALT 32  ALKPHOS 95  BILITOT 1.2  PROT 6.7  ALBUMIN 3.1*   CBC:  Recent Labs  06/21/13 1445  WBC 6.0  NEUTROABS 4.7  HGB 11.6*  HCT 37.5*  MCV 89.5  PLT 152   Cardiac Enzymes:  Recent Labs  06/21/13 1943 06/22/13 0130 06/22/13 0747  TROPONINI <0.30 <0.30 <0.30   Thyroid Function Tests:  Recent Labs  06/21/13 1943  TSH 0.647    RADIOLOGY: Dg Chest Port 1 View 06/21/2013   CLINICAL DATA:  Short of breath.  Weakness.  EXAM: PORTABLE CHEST - 1 VIEW  COMPARISON:  05/25/2013  FINDINGS: Cardiac silhouette is mildly enlarged. Mediastinum is normal in contour left anterior chest wall AICD is stable in well positioned.  Chronic interstitial thickening is stable. No edema or consolidation. No pleural effusion or pneumothorax.  The bony thorax is intact.  IMPRESSION: No acute cardiopulmonary disease.   Electronically Signed   By: Amie Portland M.D.   On: 06/21/2013 14:21  ASSESSMENT AND PLAN:  Active Problems:   Syncope   Near syncope He has recurrent and incessant SVT (likely left atrial tachycardia based on axis of p waves during SVT. As he has not tolerated amiodarone in the past, will continue sotalol 160 daily ( renal insufficiency) and observe. Will continue metoprolol. Will give IV lasix.  Leonia Reeves.D.

## 2013-06-24 LAB — BASIC METABOLIC PANEL
Chloride: 104 mEq/L (ref 96–112)
GFR calc Af Amer: 38 mL/min — ABNORMAL LOW (ref >90)
GFR calc non Af Amer: 33 mL/min — ABNORMAL LOW (ref >90)
Potassium: 4.2 mEq/L (ref 3.5–5.1)
Sodium: 141 mEq/L (ref 135–145)

## 2013-06-24 LAB — CBC
Platelets: 165 10*3/uL (ref 150–400)
RBC: 3.93 MIL/uL — ABNORMAL LOW (ref 4.22–5.81)
RDW: 18.1 % — ABNORMAL HIGH (ref 11.5–15.5)
WBC: 5.1 10*3/uL (ref 4.0–10.5)

## 2013-06-24 LAB — PROTIME-INR
INR: 1.9 — ABNORMAL HIGH (ref 0.00–1.49)
Prothrombin Time: 21.2 seconds — ABNORMAL HIGH (ref 11.6–15.2)

## 2013-06-24 MED ORDER — WARFARIN SODIUM 5 MG PO TABS
5.0000 mg | ORAL_TABLET | Freq: Once | ORAL | Status: DC
  Filled 2013-06-24: qty 1

## 2013-06-24 MED ORDER — FUROSEMIDE 10 MG/ML IJ SOLN
80.0000 mg | Freq: Once | INTRAMUSCULAR | Status: AC
  Administered 2013-06-24: 80 mg via INTRAVENOUS
  Filled 2013-06-24: qty 8

## 2013-06-24 MED ORDER — WARFARIN SODIUM 7.5 MG PO TABS
7.5000 mg | ORAL_TABLET | Freq: Once | ORAL | Status: AC
  Administered 2013-06-24: 7.5 mg via ORAL
  Filled 2013-06-24: qty 1

## 2013-06-24 NOTE — Progress Notes (Signed)
ANTICOAGULATION CONSULT NOTE - Follow up Consult  Pharmacy Consult for Coumadin Indication: DVT  No Known Allergies  Patient Measurements: Height: 6\' 3"  (190.5 cm) Weight: 279 lb 8.7 oz (126.8 kg) IBW/kg (Calculated) : 84.5 Heparin Dosing Weight: n/a  Vital Signs: Temp: 98.5 F (36.9 C) (12/25 0634) Temp src: Oral (12/25 0634) BP: 118/68 mmHg (12/25 0634) Pulse Rate: 62 (12/25 0634)  Labs:  Recent Labs  06/21/13 1445 06/21/13 1943 06/22/13 0130 06/22/13 0747 06/23/13 0441 06/24/13 0517  HGB 11.6*  --   --   --   --  10.9*  HCT 37.5*  --   --   --   --  35.1*  PLT 152  --   --   --   --  165  APTT 57*  --   --   --   --   --   LABPROT 37.1*  --  36.2*  --  27.4* 21.2*  INR 3.95*  --  3.83*  --  2.66* 1.90*  CREATININE 1.79*  --  1.71*  --  1.73* 1.87*  TROPONINI <0.30 <0.30 <0.30 <0.30  --   --     Estimated Creatinine Clearance: 47.4 ml/min (by C-G formula based on Cr of 1.87).   Assessment: 77 YOM with h/o of AFib on coumadin for new DVT. INR trended down to 1.9 today. H/H slight trend down to 10.9/35.1. Plt wnl. Patient reports no unusual bleeding or bruising.   Goal of Therapy:  INR 2-3 Monitor platelets by anticoagulation protocol: Yes   Plan:  1) Coumadin 7.5 mg x 1 dose tonight 2) F/u daily INR and AM CBC  3) Monitor for s/s of bleeding    Vinnie Level, PharmD.  Clinical Pharmacist Pager [phone]

## 2013-06-24 NOTE — Progress Notes (Signed)
Patient ID: Maurice Delgado, male   DOB: March 03, 1934, 79 y.o.   MRN: [id number]   SUBJECTIVE: Patient SOB with minimal exertion. Recurrent atrial tachy overnight.   Weight down 2 lbs overnight and 4 lbs since admit  CURRENT MEDICATIONS: . aspirin  81 mg Oral q morning - 10a  . budesonide-formoterol  2 puff Inhalation BID  . calcitRIOL  0.5 mcg Oral QODAY  . febuxostat  80 mg Oral q morning - 10a  . metoprolol succinate  25 mg Oral q morning - 10a  . pantoprazole  40 mg Oral Daily  . pregabalin  150 mg Oral BID  . sodium chloride  3 mL Intravenous Q12H  . sotalol  160 mg Oral Daily  . tamsulosin  0.4 mg Oral q morning - 10a  . tiotropium  18 mcg Inhalation q morning - 10a  . Warfarin - Pharmacist Dosing Inpatient   Does not apply q1800      OBJECTIVE: Physical Exam: Filed Vitals:   06/23/13 0015 06/23/13 0422 06/23/13 1300 06/24/13 0634  BP:  127/82 112/93 118/68  Pulse: 88 69 60 62  Temp:  97.7 F (36.5 C) 97.8 F (36.6 C) 98.5 F (36.9 C)  TempSrc:  Oral Oral Oral  Resp: 16 18 18 18   Height:      Weight:  281 lb 15.5 oz (127.9 kg)  279 lb 8.7 oz (126.8 kg)  SpO2: 95% 92% 94% 93%    Intake/Output Summary (Last 24 hours) at 06/24/13 0844 Last data filed at 06/24/13 4098  Gross per 24 hour  Intake    720 ml  Output   2625 ml  Net  -1905 ml    Telemetry reveals sinus rhythm with ventricular pacing, no further atrial tach since Sotalol started yesterday  GEN- The patient is stable appearing, alert and oriented x 3 today.   Neck- supple, no JVP Lungs- Clear to ausculation bilaterally, normal work of breathing Heart- Regular rate and rhythm, no murmurs, rubs or gallops, PMI not laterally displaced GI- soft, NT, ND, + BS Extremities- no clubbing, cyanosis, 3+ edema Skin- no rash or lesion Neuro- strength and sensation are intact  LABS: Basic Metabolic Panel:  Recent Labs  11/91/47 0441 06/24/13 0517  NA 140 141  K 3.8 4.2  CL 102 104  CO2 30 29  GLUCOSE  121* 117*  BUN 25* 30*  CREATININE 1.73* 1.87*  CALCIUM 9.1 8.7   Liver Function Tests:  Recent Labs  06/21/13 1445  AST 45*  ALT 32  ALKPHOS 95  BILITOT 1.2  PROT 6.7  ALBUMIN 3.1*   CBC:  Recent Labs  06/21/13 1445 06/24/13 0517  WBC 6.0 5.1  NEUTROABS 4.7  --   HGB 11.6* 10.9*  HCT 37.5* 35.1*  MCV 89.5 89.3  PLT 152 165   Cardiac Enzymes:  Recent Labs  06/21/13 1943 06/22/13 0130 06/22/13 0747  TROPONINI <0.30 <0.30 <0.30   Thyroid Function Tests:  Recent Labs  06/21/13 1943  TSH 0.647    RADIOLOGY: Dg Chest Port 1 View 06/21/2013   CLINICAL DATA:  Short of breath.  Weakness.  EXAM: PORTABLE CHEST - 1 VIEW  COMPARISON:  05/25/2013  FINDINGS: Cardiac silhouette is mildly enlarged. Mediastinum is normal in contour left anterior chest wall AICD is stable in well positioned.  Chronic interstitial thickening is stable. No edema or consolidation. No pleural effusion or pneumothorax.  The bony thorax is intact.  IMPRESSION: No acute cardiopulmonary disease.   Electronically Signed  By: Amie Portland M.D.   On: 06/21/2013 14:21    ASSESSMENT AND PLAN:  Active Problems:   Syncope   Near syncope He has recurrent and incessant SVT (likely left atrial tachycardia based on axis of p waves during SVT. As he has not tolerated amiodarone in the past, will continue sotalol 160 daily ( renal insufficiency) and observe. Will continue metoprolol. Will give IV lasix. I discussed the situation of recurrent SVT with the patient and his daughter. Will hope for better control after today's dose of sotalol. Catheter ablation has been discussed as an alternative. He is not a great candidate with multiple comorbidities but this might be our only option to control his tachycardia, either with primary SVT ablation or AV node ablation in the setting of a BiV ICD.  Leonia Reeves.D.

## 2013-06-24 NOTE — Progress Notes (Signed)
Resp Care Note ; Pt refusing Cpap tonight.

## 2013-06-25 ENCOUNTER — Encounter (HOSPITAL_COMMUNITY)
Admission: EM | Disposition: A | Discharge status: Home / Self Care | Payer: Self-pay | Source: Home / Self Care | Attending: Internal Medicine

## 2013-06-25 ENCOUNTER — Encounter (HOSPITAL_COMMUNITY): Payer: Self-pay | Admitting: *Deleted

## 2013-06-25 DIAGNOSIS — I471 Supraventricular tachycardia: Secondary | ICD-10-CM

## 2013-06-25 HISTORY — PX: SUPRAVENTRICULAR TACHYCARDIA ABLATION: SHX5492

## 2013-06-25 LAB — BASIC METABOLIC PANEL
BUN: 32 mg/dL — ABNORMAL HIGH (ref 6–23)
CO2: 29 mEq/L (ref 19–32)
Chloride: 103 mEq/L (ref 96–112)
GFR calc non Af Amer: 32 mL/min — ABNORMAL LOW (ref >90)
Potassium: 3.5 mEq/L (ref 3.5–5.1)
Sodium: 141 mEq/L (ref 135–145)

## 2013-06-25 LAB — PROTIME-INR: Prothrombin Time: 19.5 seconds — ABNORMAL HIGH (ref 11.6–15.2)

## 2013-06-25 LAB — GLUCOSE, CAPILLARY

## 2013-06-25 SURGERY — SUPRAVENTRICULAR TACHYCARDIA ABLATION
Anesthesia: LOCAL

## 2013-06-25 MED ORDER — WARFARIN SODIUM 7.5 MG PO TABS
7.5000 mg | ORAL_TABLET | Freq: Once | ORAL | Status: AC
  Administered 2013-06-25: 7.5 mg via ORAL
  Filled 2013-06-25: qty 1

## 2013-06-25 MED ORDER — SODIUM CHLORIDE 0.9 % IJ SOLN: 3.0000 mL | INTRAMUSCULAR | Status: DC | PRN

## 2013-06-25 MED ORDER — BUPIVACAINE HCL (PF) 0.25 % IJ SOLN
INTRAMUSCULAR | Status: AC
  Filled 2013-06-25: qty 30

## 2013-06-25 MED ORDER — HEPARIN (PORCINE) IN NACL 2-0.9 UNIT/ML-% IJ SOLN
INTRAMUSCULAR | Status: AC
  Filled 2013-06-25: qty 500

## 2013-06-25 MED ORDER — FENTANYL CITRATE 0.05 MG/ML IJ SOLN
INTRAMUSCULAR | Status: AC
  Filled 2013-06-25: qty 2

## 2013-06-25 MED ORDER — DIGOXIN 125 MCG PO TABS
125.0000 ug | ORAL_TABLET | Freq: Every morning | ORAL | Status: DC
  Administered 2013-06-26 – 2013-06-28 (×3): 125 ug via ORAL
  Filled 2013-06-25 (×3): qty 1

## 2013-06-25 MED ORDER — SODIUM CHLORIDE 0.9 % IJ SOLN
3.0000 mL | Freq: Two times a day (BID) | INTRAMUSCULAR | Status: DC
  Administered 2013-06-25 – 2013-06-28 (×5): 3 mL via INTRAVENOUS

## 2013-06-25 MED ORDER — ACETAMINOPHEN 325 MG PO TABS: 650.0000 mg | ORAL_TABLET | ORAL | Status: DC | PRN

## 2013-06-25 MED ORDER — ONDANSETRON HCL 4 MG/2ML IJ SOLN: 4.0000 mg | Freq: Four times a day (QID) | INTRAMUSCULAR | Status: DC | PRN

## 2013-06-25 MED ORDER — SODIUM CHLORIDE 0.9 % IV SOLN: 250.0000 mL | INTRAVENOUS | Status: DC | PRN

## 2013-06-25 MED ORDER — MIDAZOLAM HCL 5 MG/5ML IJ SOLN
INTRAMUSCULAR | Status: AC
  Filled 2013-06-25: qty 5

## 2013-06-25 NOTE — Progress Notes (Signed)
ANTICOAGULATION CONSULT NOTE - Follow up Consult  Pharmacy Consult for Coumadin Indication: DVT  No Known Allergies  Patient Measurements: Height: 6\' 3"  (190.5 cm) Weight: 277 lb 14.4 oz (126.055 kg) IBW/kg (Calculated) : 84.5 Heparin Dosing Weight: n/a  Vital Signs: Temp: 98.9 F (37.2 C) (12/26 0548) Temp src: Axillary (12/26 0548) BP: 118/75 mmHg (12/26 0548) Pulse Rate: 84 (12/26 0548)  Labs:  Recent Labs  06/23/13 0441 06/24/13 0517 06/25/13 06/25/13 0038  HGB  --  10.9*  --   --   HCT  --  35.1*  --   --   PLT  --  165  --   --   LABPROT 27.4* 21.2*  --  19.5*  INR 2.66* 1.90*  --  1.70*  CREATININE 1.73* 1.87* 1.91*  --     Estimated Creatinine Clearance: 46.3 ml/min (by C-G formula based on Cr of 1.91).   Assessment: 24 YOM started on coumadin 11/26 at [facility] for new subacute LLE DVT. Admitted with INR 3.83 and dose was held 12/23.   Now INR trended down to 1.7 today. PMH of hemoptysis.   Goal of Therapy:  INR 2-3   Plan:  1) repeat Coumadin 7.5 mg x 1 dose tonight 2) F/u daily INR  3) Monitor for s/s of bleeding  Herby Abraham, Pharm.D. [phone] 06/25/2013 9:05 AM

## 2013-06-25 NOTE — Clinical Documentation Improvement (Signed)
THIS DOCUMENT IS NOT A PERMANENT PART OF THE MEDICAL RECORD  Please update your documentation with the medical record to reflect your response to this query. If you need help, please call [phone].  06/25/13  Dear Associates,  In a better effort to capture your patient's severity of illness, reflect appropriate length of stay and utilization of resources, a review of the patient medical record has revealed the following indicators. Based on your clinical judgment, please clarify and document in a progress note and/or discharge summary the clinical condition associated with the following supporting information: In responding to this query please exercise your independent judgment.  The fact that a query is asked, does not imply that any particular answer is desired or expected.  Possible Clinical Condition:  Chronic Respiratory Failure  Acute Respiratory Distress  Other Condition  Cannot Clinically Determine   Supporting Information:  Risk Factors: DVT, continuous home oxygen at 2-3 l/m per nasal cannula, COPD, CHF, unspecified sleep apnea, NICM, AFib,   Signs&Symptoms:SOB with minimal exertion per progress notes  Lab:pro BNP: 3995.0 12./22; 4238.0 12/22  Treatment: Lasix injection sixty x 1; lasix injection eighty x 1; lasix eighty x 1 (12/22; 12/24; 12/25)   Oxygen:concentrations/m/ode: O2 at 3 l/m [place]  Respiratory Treatment: CPAP at HS; symbicort; xopenex,   You may use possible, probable, or suspect with inpatient documentation. possible, probable, suspected diagnoses MUST be documented at the time of discharge I have amended my initial consult note. GT   Thank You,  Amada Kingfisher RN, BSN, CCM   Clinical Documentation Specialist: Health Information Management/[email or web address] [phone]

## 2013-06-25 NOTE — Progress Notes (Signed)
Patient ID: Maurice Delgado, male   DOB: 01/14/1934, 79 y.o.   MRN: [id number]   SUBJECTIVE: Patient with recurrent atrial tach despite Sotalol. He has had nearly 50% atrial tachy at 130/min.  Weight down 2 lbs overnight and 6 lbs since admit  CURRENT MEDICATIONS: . aspirin  81 mg Oral q morning - 10a  . budesonide-formoterol  2 puff Inhalation BID  . calcitRIOL  0.5 mcg Oral QODAY  . febuxostat  80 mg Oral q morning - 10a  . metoprolol succinate  25 mg Oral q morning - 10a  . pantoprazole  40 mg Oral Daily  . pregabalin  150 mg Oral BID  . sodium chloride  3 mL Intravenous Q12H  . sotalol  160 mg Oral Daily  . tamsulosin  0.4 mg Oral q morning - 10a  . tiotropium  18 mcg Inhalation q morning - 10a  . Warfarin - Pharmacist Dosing Inpatient   Does not apply q1800      OBJECTIVE: Physical Exam: Filed Vitals:   06/24/13 1500 06/24/13 2148 06/25/13 0107 06/25/13 0548  BP: 104/71 117/79  118/75  Pulse: 67 63 120 84  Temp: 97.5 F (36.4 C) 98.1 F (36.7 C)  98.9 F (37.2 C)  TempSrc: Oral Oral  Axillary  Resp: 16 17 16 16   Height:      Weight:    277 lb 14.4 oz (126.055 kg)  SpO2: 95% 96%  91%    Intake/Output Summary (Last 24 hours) at 06/25/13 0844 Last data filed at 06/25/13 0551  Gross per 24 hour  Intake    240 ml  Output   2050 ml  Net  -1810 ml    Telemetry reveals sinus rhythm with ventricular pacing, atrial tach at 120  GEN- The patient is stable appearing, alert and oriented x 3 today.   Neck- supple, no JVP Lungs- Clear to ausculation bilaterally, normal work of breathing Heart- Regular rate and rhythm, no murmurs, rubs or gallops, PMI not laterally displaced GI- soft, NT, ND, + BS Extremities- no clubbing, cyanosis, 3+ edema Skin- no rash or lesion Neuro- strength and sensation are intact  LABS: Basic Metabolic Panel:  Recent Labs  40/98/11 0517 06/25/13  NA 141 141  K 4.2 3.5  CL 104 103  CO2 29 29  GLUCOSE 117* 97  BUN 30* 32*  CREATININE  1.87* 1.91*  CALCIUM 8.7 8.9   CBC:  Recent Labs  06/24/13 0517  WBC 5.1  HGB 10.9*  HCT 35.1*  MCV 89.3  PLT 165   RADIOLOGY: Dg Chest Port 1 View 06/21/2013   CLINICAL DATA:  Short of breath.  Weakness.  EXAM: PORTABLE CHEST - 1 VIEW  COMPARISON:  05/25/2013  FINDINGS: Cardiac silhouette is mildly enlarged. Mediastinum is normal in contour left anterior chest wall AICD is stable in well positioned.  Chronic interstitial thickening is stable. No edema or consolidation. No pleural effusion or pneumothorax.  The bony thorax is intact.  IMPRESSION: No acute cardiopulmonary disease.   Electronically Signed   By: Amie Portland M.D.   On: 06/21/2013 14:21    ASSESSMENT AND PLAN:  Active Problems:   Syncope   Near syncope He has recurrent and incessant SVT (likely left atrial tachycardia based on axis of p waves during SVT. I have treated him now for 3 days with sotalol and his symptoms persist. Although his p wave morphology would suggest an atrial tachy in the left atrium, I might have something more easily ablatable. Either  way, we need to get his heart rate down. My plan is to proceed with catheter ablation. If he has a left atrial tachycardia, then I will proceed with AV node ablation and if his SVT is on the right side, will attempt primary SVT ablation. I have discussed the treatment options with the patient and his daughter. He could have an occlude SVC and would have to ablate through the right IJ or subclavian.   Leonia Reeves.D.

## 2013-06-25 NOTE — Progress Notes (Signed)
Placed patient on CPAP for the night at 5cm with oxygen set at 2lpm and Sp02=97% at this time

## 2013-06-25 NOTE — CV Procedure (Signed)
EPS/RFA of atrial tachycardia and AV node without immediate complication. [id number].

## 2013-06-25 NOTE — Progress Notes (Signed)
Placed pt. On cpap. Pt. Is tolerating well at this time. Pt. Has 3L of oxygen titrated into the cpap. 

## 2013-06-26 ENCOUNTER — Other Ambulatory Visit: Payer: Self-pay

## 2013-06-26 DIAGNOSIS — Z7901 Long term (current) use of anticoagulants: Secondary | ICD-10-CM

## 2013-06-26 DIAGNOSIS — I428 Other cardiomyopathies: Secondary | ICD-10-CM

## 2013-06-26 DIAGNOSIS — Z86718 Personal history of other venous thrombosis and embolism: Secondary | ICD-10-CM

## 2013-06-26 DIAGNOSIS — I471 Supraventricular tachycardia: Secondary | ICD-10-CM

## 2013-06-26 DIAGNOSIS — I498 Other specified cardiac arrhythmias: Secondary | ICD-10-CM

## 2013-06-26 LAB — BASIC METABOLIC PANEL
BUN: 27 mg/dL — ABNORMAL HIGH (ref 6–23)
CO2: 30 mEq/L (ref 19–32)
Chloride: 103 mEq/L (ref 96–112)
GFR calc non Af Amer: 36 mL/min — ABNORMAL LOW (ref >90)
Glucose, Bld: 133 mg/dL — ABNORMAL HIGH (ref 70–99)
Potassium: 4.2 mEq/L (ref 3.5–5.1)

## 2013-06-26 LAB — PROTIME-INR
INR: 1.94 — ABNORMAL HIGH (ref 0.00–1.49)
Prothrombin Time: 21.6 seconds — ABNORMAL HIGH (ref 11.6–15.2)

## 2013-06-26 MED ORDER — FUROSEMIDE 10 MG/ML IJ SOLN
80.0000 mg | Freq: Two times a day (BID) | INTRAMUSCULAR | Status: DC
  Administered 2013-06-26 – 2013-06-28 (×5): 80 mg via INTRAVENOUS
  Filled 2013-06-26 (×7): qty 8

## 2013-06-26 MED ORDER — WARFARIN SODIUM 7.5 MG PO TABS
7.5000 mg | ORAL_TABLET | Freq: Once | ORAL | Status: AC
  Administered 2013-06-26: 7.5 mg via ORAL
  Filled 2013-06-26: qty 1

## 2013-06-26 NOTE — Progress Notes (Signed)
ANTICOAGULATION CONSULT NOTE - Follow up Consult  Pharmacy Consult for Coumadin Indication: DVT  No Known Allergies  Patient Measurements: Height: 6\' 3"  (190.5 cm) Weight: 281 lb 1.6 oz (127.506 kg) IBW/kg (Calculated) : 84.5 Heparin Dosing Weight: n/a  Vital Signs: Temp: 99.1 F (37.3 C) (12/27 0556) Temp src: Oral (12/27 0556) BP: 116/76 mmHg (12/27 0556) Pulse Rate: 86 (12/27 0556)  Labs:  Recent Labs  06/24/13 0517 06/25/13 06/25/13 0038 06/26/13 0440  HGB 10.9*  --   --   --   HCT 35.1*  --   --   --   PLT 165  --   --   --   LABPROT 21.2*  --  19.5* 21.6*  INR 1.90*  --  1.70* 1.94*  CREATININE 1.87* 1.91*  --   --     Estimated Creatinine Clearance: 46.6 ml/min (by C-G formula based on Cr of 1.91).   Assessment: 64 YOM started on coumadin 11/26 at [facility] for new subacute LLE DVT. Admitted with INR 3.83 and dose was held 12/23. PMH of hemoptysis. INR is at 1.94 today and trending up. Anticipate INR to be therapeutic tomorrow.   Goal of Therapy:  INR 2-3   Plan:  1) repeat Coumadin 7.5 mg x 1 dose tonight 2) F/u daily INR  3) Monitor for s/s of bleeding

## 2013-06-26 NOTE — Progress Notes (Signed)
Patient Name: Maurice Delgado Date of Encounter: 06/26/2013   Principal Problem:   Atrial tachycardia Active Problems:   Acute on chronic systolic CHF (congestive heart failure), NYHA class 3   HYPERTENSION   CKD (chronic kidney disease) stage 3, GFR 30-59 ml/min   Near syncope   NICM (nonischemic cardiomyopathy)   COPD (chronic obstructive pulmonary disease)   Atrial fibrillation   S/P IVC filter   History of DVT (deep vein thrombosis)   Anticoagulated on Coumadin   SUBJECTIVE  S/p rfca for atrial tach yesterday.  No recurrence overnight.  Still sob with minimal activity in his room.  Cont to have significant bilat LE edema.  CURRENT MEDS . aspirin  81 mg Oral q morning - 10a  . budesonide-formoterol  2 puff Inhalation BID  . calcitRIOL  0.5 mcg Oral QODAY  . digoxin  125 mcg Oral q morning - 10a  . febuxostat  80 mg Oral q morning - 10a  . metoprolol succinate  25 mg Oral q morning - 10a  . pantoprazole  40 mg Oral Daily  . pregabalin  150 mg Oral BID  . sodium chloride  3 mL Intravenous Q12H  . sodium chloride  3 mL Intravenous Q12H  . sotalol  160 mg Oral Daily  . tamsulosin  0.4 mg Oral q morning - 10a  . tiotropium  18 mcg Inhalation q morning - 10a  . Warfarin - Pharmacist Dosing Inpatient   Does not apply q1800    OBJECTIVE  Filed Vitals:   06/25/13 2004 06/25/13 2029 06/25/13 2256 06/26/13 0556  BP: 135/87   116/76  Pulse: 78  85 86  Temp: 98 F (36.7 C)   99.1 F (37.3 C)  TempSrc: Axillary   Oral  Resp: 16  18 17   Height:      Weight:    281 lb 1.6 oz (127.506 kg)  SpO2: 95% 91% 97% 93%   No intake or output data in the 24 hours ending 06/26/13 0748 Filed Weights   06/24/13 0634 06/25/13 0548 06/26/13 0556  Weight: 279 lb 8.7 oz (126.8 kg) 277 lb 14.4 oz (126.055 kg) 281 lb 1.6 oz (127.506 kg)    PHYSICAL EXAM  General: Pleasant, NAD. Neuro: Alert and oriented X 3. Moves all extremities spontaneously. Psych: Normal affect. HEENT:   Normal  Neck: Supple without bruits or JVD. Lungs:  Resp regular and unlabored, diminished breath sounds bilat bases with crackles l base. Heart: RRR no s3, s4, or murmurs. Abdomen: protuberant, semi-firm, non-tender, BS + x 4.  Extremities: No clubbing, cyanosis.  2+ bilat LE edema. DP/PT/Radials 1+ and equal bilaterally.  Accessory Clinical Findings  CBC  Recent Labs  06/24/13 0517  WBC 5.1  HGB 10.9*  HCT 35.1*  MCV 89.3  PLT 165   Basic Metabolic Panel  Recent Labs  06/24/13 0517 06/25/13  NA 141 141  K 4.2 3.5  CL 104 103  CO2 29 29  GLUCOSE 117* 97  BUN 30* 32*  CREATININE 1.87* 1.91*  CALCIUM 8.7 8.9   Lab Results  Component Value Date   INR 1.94* 06/26/2013   INR 1.70* 06/25/2013   INR 1.90* 06/24/2013   TELE  Av paced/v paced.  ECG  Bi-v paced, 84.  Radiology/Studies  Dg Chest Port 1 View  06/21/2013   CLINICAL DATA:  Short of breath.  Weakness.  EXAM: PORTABLE CHEST - 1 VIEW  COMPARISON:  05/25/2013  FINDINGS: Cardiac silhouette is mildly enlarged. Mediastinum is  normal in contour left anterior chest wall AICD is stable in well positioned.  Chronic interstitial thickening is stable. No edema or consolidation. No pleural effusion or pneumothorax.  The bony thorax is intact.  IMPRESSION: No acute cardiopulmonary disease.   Electronically Signed   By: Amie Portland M.D.   On: 06/21/2013 14:21   ASSESSMENT AND PLAN  1.  Atrial Tachycardia:  S/p EP study and RFCA yesterday.  No recurrence of atrial tachycardia overnight.  Brief op note indicates RFCA of AV node as well.  If this is the case, can we d/c sotalol?  Will d/w MD.  2.  Acute on chronic systolic chf:  Pt is -4.5 liters and weight has been variable - coming in @ 283 lbs, dropping to 277 but currently 281.  He continues to have significant lower ext edema and volume overload on exam.  Upon review of his weights, he was down to 265 in July and has slowly crept up to his current weight.  We will  aggressively diurese today, watching renal fxn closely.  He is on toprol and digoxin.  No ACEI/ARB 2/2 CKD III.  Consider addition of hydralazine/nitrates - BP's 1-teens to 130's.  3.  HTN: stable.  4.  CKD III:  F/u bmet this AM.  Diurese today.  5.  H/O DVT's:  On coumadin.  S/P IVC filter.  INR 1.94 today.  Pharmacy following.  Signed, Nicolasa Ducking NP  Agree with above. Seen and examined. Data reviewed as provided by Brion Aliment NP.   -IV lasix 80 BID -Watch creat. (mildly increased through admission) -Currently AV paced (AV node ablation) -? Need for sotalol now that AV node is ablated. Perhaps this is to prevent recurrent atach which could be atrial sensed then V paced? Will need to discuss with Dr. Ladona Ridgel.   SKAINS, MARK

## 2013-06-27 DIAGNOSIS — Z5181 Encounter for therapeutic drug level monitoring: Secondary | ICD-10-CM

## 2013-06-27 DIAGNOSIS — I509 Heart failure, unspecified: Secondary | ICD-10-CM

## 2013-06-27 DIAGNOSIS — Z9889 Other specified postprocedural states: Secondary | ICD-10-CM

## 2013-06-27 DIAGNOSIS — I5023 Acute on chronic systolic (congestive) heart failure: Secondary | ICD-10-CM

## 2013-06-27 DIAGNOSIS — Z7901 Long term (current) use of anticoagulants: Secondary | ICD-10-CM

## 2013-06-27 LAB — BASIC METABOLIC PANEL
CO2: 28 mEq/L (ref 19–32)
Chloride: 100 mEq/L (ref 96–112)
Glucose, Bld: 133 mg/dL — ABNORMAL HIGH (ref 70–99)
Potassium: 3.5 mEq/L (ref 3.5–5.1)
Sodium: 136 mEq/L (ref 135–145)

## 2013-06-27 LAB — PROTIME-INR: INR: 2.15 — ABNORMAL HIGH (ref 0.00–1.49)

## 2013-06-27 MED ORDER — POTASSIUM CHLORIDE CRYS ER 20 MEQ PO TBCR
40.0000 meq | EXTENDED_RELEASE_TABLET | Freq: Two times a day (BID) | ORAL | Status: AC
  Administered 2013-06-27 (×2): 40 meq via ORAL
  Filled 2013-06-27 (×2): qty 2

## 2013-06-27 MED ORDER — WARFARIN SODIUM 7.5 MG PO TABS
7.5000 mg | ORAL_TABLET | Freq: Once | ORAL | Status: AC
  Administered 2013-06-27: 7.5 mg via ORAL
  Filled 2013-06-27 (×2): qty 1

## 2013-06-27 NOTE — Progress Notes (Addendum)
Patient Name: Maurice Delgado Date of Encounter: 06/27/2013   Principal Problem:   Atrial tachycardia Active Problems:   HYPERTENSION   COPD (chronic obstructive pulmonary disease)   Atrial fibrillation   CKD (chronic kidney disease) stage 3, GFR 30-59 ml/min   S/P IVC filter   Near syncope   History of DVT (deep vein thrombosis)   Anticoagulated on Coumadin   NICM (nonischemic cardiomyopathy)   Acute on chronic systolic CHF (congestive heart failure), NYHA class 3   SUBJECTIVE  S/p rfca for atrial tach 12/26.  No recurrence overnight.  Still sob with minimal activity in his room.  Cont to have significant bilat LE edema. P wave seen on telemetry,, AV pacing. Previous AV nodal ablation.  CURRENT MEDS . aspirin  81 mg Oral q morning - 10a  . budesonide-formoterol  2 puff Inhalation BID  . calcitRIOL  0.5 mcg Oral QODAY  . digoxin  125 mcg Oral q morning - 10a  . febuxostat  80 mg Oral q morning - 10a  . furosemide  80 mg Intravenous BID  . metoprolol succinate  25 mg Oral q morning - 10a  . pantoprazole  40 mg Oral Daily  . pregabalin  150 mg Oral BID  . sodium chloride  3 mL Intravenous Q12H  . sodium chloride  3 mL Intravenous Q12H  . sotalol  160 mg Oral Daily  . tamsulosin  0.4 mg Oral q morning - 10a  . tiotropium  18 mcg Inhalation q morning - 10a  . Warfarin - Pharmacist Dosing Inpatient   Does not apply q1800    OBJECTIVE  Filed Vitals:   06/26/13 1426 06/26/13 2108 06/27/13 0608 06/27/13 0819  BP: 102/60 121/77 123/81   Pulse: 81 79 81   Temp: 98.6 F (37 C) 98.4 F (36.9 C) 98.2 F (36.8 C)   TempSrc: Oral Oral Oral   Resp: 18 18 18    Height:      Weight:   274 lb 11.1 oz (124.6 kg)   SpO2: 95% 100% 93% 95%    Intake/Output Summary (Last 24 hours) at 06/27/13 0821 Last data filed at 06/27/13 1610  Gross per 24 hour  Intake    960 ml  Output   3700 ml  Net  -2740 ml   Filed Weights   06/25/13 0548 06/26/13 0556 06/27/13 0608  Weight:  277 lb 14.4 oz (126.055 kg) 281 lb 1.6 oz (127.506 kg) 274 lb 11.1 oz (124.6 kg)    PHYSICAL EXAM  General: Pleasant, NAD. Neuro: Alert and oriented X 3. Moves all extremities spontaneously. Psych: Normal affect. HEENT:  Normal  Neck: Supple without bruits or JVD. Lungs:  Resp regular and unlabored, diminished breath sounds bilat bases with crackles l base. Heart: RRR no s3, s4, or murmurs. Abdomen: protuberant, semi-firm, non-tender, BS + x 4.  Extremities: No clubbing, cyanosis.  4+ bilat LE edema, tense. DP/PT/Radials 1+ and equal bilaterally.  Accessory Clinical Findings  Basic Metabolic Panel  Recent Labs  06/26/13 0440 06/27/13 0540  NA 139 136  K 4.2 3.5  CL 103 100  CO2 30 28  GLUCOSE 133* 133*  BUN 27* 26*  CREATININE 1.75* 1.69*  CALCIUM 8.8 8.7   Lab Results  Component Value Date   INR 2.15* 06/27/2013   INR 1.94* 06/26/2013   INR 1.70* 06/25/2013   TELE  Av paced/v paced. P waves seen  ECG  Bi-v paced, 84.  Radiology/Studies  Dg Chest [facility] 1 [address]  06/21/2013   CLINICAL DATA:  Short of breath.  Weakness.  EXAM: PORTABLE CHEST - 1 VIEW  COMPARISON:  05/25/2013  FINDINGS: Cardiac silhouette is mildly enlarged. Mediastinum is normal in contour left anterior chest wall AICD is stable in well positioned.  Chronic interstitial thickening is stable. No edema or consolidation. No pleural effusion or pneumothorax.  The bony thorax is intact.  IMPRESSION: No acute cardiopulmonary disease.   Electronically Signed   By: Amie Portland M.D.   On: 06/21/2013 14:21   ASSESSMENT AND PLAN  1.  Atrial Tachycardia:  S/p EP study and RFCA 06/25/13 by Dr. Ladona Ridgel.  No recurrence of atrial tachycardia.  Brief op note indicates RFCA of AV node as well.  There is question on whether or not we can discontinue sotalol.  One thought is that perhaps sotalol is continuing to maintain sinus rhythm in this perioperative state hence improving AV synchrony even though AV nodal ablation  has taken place. Pacemaker is essentially functioning as AV node. Perhaps this was what Dr. Ladona Ridgel was considering when continuing sotalol. May wish to address tomorrow with electrophysiology. 2.  Acute on chronic systolic chf:  Pt is - 7.2 liters and weight has been variable - coming in @ 283 lbs, dropping to 277 but currently 275.  He continues to have significant lower ext edema and volume overload on exam.  Upon review of his weights, he was down to 265 in July and has slowly crept up to his current weight.  We will aggressively continue to diurese today, watching renal fxn closely which has improved.  He is on toprol and digoxin.  No ACEI/ARB 2/2 CKD III.  Consider addition of hydralazine/nitrates - BP's 1-teens to 130's.  3.  HTN: stable.  4.  CKD III:  Creatinine slightly decreased today.  Diurese today.  5.  H/O DVT's:  On coumadin.  S/P IVC filter.  INR 1.94 today.  Pharmacy following.  6. Hypokalemia-I will go ahead and replete today with 40 mEq twice a day for 2 doses only.  Possible discharge tomorrow or the next day depending on volume status.  Donato Schultz, MD 06/27/2013

## 2013-06-27 NOTE — Progress Notes (Signed)
ANTICOAGULATION CONSULT NOTE - Follow up Consult  Pharmacy Consult for Coumadin Indication: DVT  No Known Allergies  Patient Measurements: Height: 6\' 3"  (190.5 cm) Weight: 274 lb 11.1 oz (124.6 kg) IBW/kg (Calculated) : 84.5 Heparin Dosing Weight: n/a  Vital Signs: Temp: 98.2 F (36.8 C) (12/28 0608) Temp src: Oral (12/28 0608) BP: 123/81 mmHg (12/28 0608) Pulse Rate: 81 (12/28 0608)  Labs:  Recent Labs  06/25/13 06/25/13 0038 06/26/13 0440 06/27/13 0540  LABPROT  --  19.5* 21.6* 23.3*  INR  --  1.70* 1.94* 2.15*  CREATININE 1.91*  --  1.75* 1.69*    Estimated Creatinine Clearance: 52 ml/min (by C-G formula based on Cr of 1.69).   Assessment: 34 YOM started on coumadin 11/26 at [facility] for new subacute LLE DVT. Admitted with INR 3.83 and dose was held 12/23. PMH of hemoptysis. INR is therapeutic at 2.15 and trending up nicely. No bleeding reported today.   Goal of Therapy:  INR 2-3   Plan:  1) repeat Coumadin 7.5 mg x 1 dose tonight 2) F/u daily INR  3) Monitor for s/s of bleeding  4) For discharge, consider coumadin 7.5 mg daily till outpatient clinic appointment.  Vinnie Level, PharmD.  Clinical Pharmacist Pager [phone]

## 2013-06-28 ENCOUNTER — Other Ambulatory Visit: Payer: Self-pay | Admitting: Physician Assistant

## 2013-06-28 ENCOUNTER — Encounter (HOSPITAL_COMMUNITY): Payer: Self-pay | Admitting: Physician Assistant

## 2013-06-28 DIAGNOSIS — N189 Chronic kidney disease, unspecified: Secondary | ICD-10-CM

## 2013-06-28 DIAGNOSIS — I509 Heart failure, unspecified: Secondary | ICD-10-CM

## 2013-06-28 DIAGNOSIS — Z86718 Personal history of other venous thrombosis and embolism: Secondary | ICD-10-CM

## 2013-06-28 DIAGNOSIS — N289 Disorder of kidney and ureter, unspecified: Secondary | ICD-10-CM

## 2013-06-28 LAB — PROTIME-INR
INR: 2.39 — ABNORMAL HIGH (ref 0.00–1.49)
Prothrombin Time: 25.3 seconds — ABNORMAL HIGH (ref 11.6–15.2)

## 2013-06-28 MED ORDER — CARVEDILOL 6.25 MG PO TABS: 6.2500 mg | ORAL_TABLET | Freq: Two times a day (BID) | ORAL | Status: DC

## 2013-06-28 MED ORDER — SOTALOL HCL 160 MG PO TABS: 160.0000 mg | ORAL_TABLET | Freq: Every day | ORAL | Status: DC

## 2013-06-28 MED ORDER — WARFARIN SODIUM 5 MG PO TABS: 7.5000 mg | ORAL_TABLET | Freq: Every day | ORAL | Status: DC

## 2013-06-28 MED ORDER — FUROSEMIDE 40 MG PO TABS: 80.0000 mg | ORAL_TABLET | Freq: Two times a day (BID) | ORAL | Status: DC

## 2013-06-28 NOTE — Progress Notes (Signed)
SUBJECTIVE: The patient is doing well today.  At this time, he denies chest pain, shortness of breath, or any new concerns.  He feels that he is at his baseline and is anxious to go home.  He has had no further atrial tach since ablation Friday.  Weight down 3 pounds yesterday, down 12 pounds since admission.  CURRENT MEDICATIONS: . aspirin  81 mg Oral q morning - 10a  . budesonide-formoterol  2 puff Inhalation BID  . calcitRIOL  0.5 mcg Oral QODAY  . digoxin  125 mcg Oral q morning - 10a  . febuxostat  80 mg Oral q morning - 10a  . furosemide  80 mg Intravenous BID  . metoprolol succinate  25 mg Oral q morning - 10a  . pantoprazole  40 mg Oral Daily  . pregabalin  150 mg Oral BID  . sodium chloride  3 mL Intravenous Q12H  . sodium chloride  3 mL Intravenous Q12H  . sotalol  160 mg Oral Daily  . tamsulosin  0.4 mg Oral q morning - 10a  . tiotropium  18 mcg Inhalation q morning - 10a  . Warfarin - Pharmacist Dosing Inpatient   Does not apply q1800      OBJECTIVE: Physical Exam: Filed Vitals:   06/27/13 1353 06/27/13 2010 06/27/13 2017 06/28/13 0511  BP: 119/77  125/71 127/86  Pulse: 82  83 83  Temp: 97.5 F (36.4 C)  98.3 F (36.8 C) 98.6 F (37 C)  TempSrc: Oral  Oral Oral  Resp: 18  18 20   Height:      Weight:    271 lb 11.2 oz (123.242 kg)  SpO2: 95% 96% 100% 91%    Intake/Output Summary (Last 24 hours) at 06/28/13 0733 Last data filed at 06/28/13 0511  Gross per 24 hour  Intake    240 ml  Output   3700 ml  Net  -3460 ml    Telemetry reveals  AV pacing  GEN- The patient is well appearing, alert and oriented x 3 today.   Head- normocephalic, atraumatic Eyes-  Sclera clear, conjunctiva pink Ears- hearing intact Oropharynx- clear Neck- supple,   Lungs- Clear to ausculation bilaterally, normal work of breathing Heart- Regular rate and rhythm, no murmurs, rubs or gallops, PMI not laterally displaced GI- soft, NT, ND, + BS Extremities- no clubbing,  cyanosis, + 2 chronic edema Skin- no rash or lesion Psych- euthymic mood, full affect Neuro- strength and sensation are intact  LABS: Basic Metabolic Panel:  Recent Labs  16/10/96 0440 06/27/13 0540  NA 139 136  K 4.2 3.5  CL 103 100  CO2 30 28  GLUCOSE 133* 133*  BUN 27* 26*  CREATININE 1.75* 1.69*  CALCIUM 8.8 8.7   RADIOLOGY: Dg Chest Port 1 View 06/21/2013   CLINICAL DATA:  Short of breath.  Weakness.  EXAM: PORTABLE CHEST - 1 VIEW  COMPARISON:  05/25/2013  FINDINGS: Cardiac silhouette is mildly enlarged. Mediastinum is normal in contour left anterior chest wall AICD is stable in well positioned.  Chronic interstitial thickening is stable. No edema or consolidation. No pleural effusion or pneumothorax.  The bony thorax is intact.  IMPRESSION: No acute cardiopulmonary disease.   Electronically Signed   By: Amie Portland M.D.   On: 06/21/2013 14:21    ASSESSMENT AND PLAN:  Principal Problem:   Atrial tachycardia Active Problems:   HYPERTENSION   COPD (chronic obstructive pulmonary disease)   Atrial fibrillation   CKD (chronic kidney disease)  stage 3, GFR 30-59 ml/min   S/P IVC filter   Near syncope   History of DVT (deep vein thrombosis)   Anticoagulated on Coumadin   NICM (nonischemic cardiomyopathy)   Acute on chronic systolic CHF (congestive heart failure), NYHA class 3  1. Acute on chronic systolic dysfunction Improved with diuresis Will switch lasix to 80mg  PO BID Anticipate decreasing lasix as an outpatient 2 gram sodium restriction Consider starting ace inhibitor as an outpatient  2. Atrial tachycardia Doing well s/p ablation Continue sotalol This can be stopped in the office if no further arrhythmias in 3 months  3. afib Stable Continue coumadin  4. Venous insufficiency/ prior DVT Continue coumadin Support hose  5. Acute on chronic renal failure Creatinine is stable Will need outpatient bmet in 1 week  6. HTN Stable No change required  today  Needs transition of care appointment with an extender within 7 days Follow-up with Dr Katrinka Blazing in 3-4 weeks Return to see me in 3 months  DC to home

## 2013-06-28 NOTE — Discharge Summary (Signed)
Discharge Summary   Patient ID: Maurice Delgado MRN: [id number], DOB/AGE: 77-14-1935 77 y.o. Admit date: 06/21/2013 D/C date:     06/28/2013  Primary Care Provider: [name], MD Primary Cardiologist: Mendel Ryder / EP- Allred  Primary Discharge Diagnoses:  1. Acute on chronic CHF/NICM EF 35% - h/o CRT-D implant (MDT, most recent gen change 2011) 2. Atrial tachycardia s/p EPS/RFA of atrial tachycardia and AV node 06/25/13 - sotalol added this admission 3. Paroxysmal atrial fibrillation 4. Venous insufficiency with prior history of multiple DVTs s/p IVC filter 2007 - Coumadin recently re-added 05/2013 5. Acute on chronic kidney disease stage III 6. HTN 7. Obesity BMI 33.96 kg/(m^2).  Secondary Discharge Diagnoses:  1. Recent L fibular fx 03/2013, nonsurgical approach undertaken 2. 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 COPD 4. Chronic respiratory failure on home oxygen 5. OSA on CPAP 6. H/o of hemoptysis with pulmonary fibrosis (patient has refused biopsies in the past) 7. Gout 8. GERD 9. H/o retroperitoneal bleeding on Coumadin remotely   Hospital Course: Mr. Maurice Delgado is a 79 y/o M with NICM EF 35%/chronic systolic HF s/p CRT-D implant, atrial fibrillation (not previously anticoagulated due to prior RP bleed) but recently started on Coumadin secondary to multiple DVT's (prior IVC filter 2007), hyperthyroidism secondary to amiodarone, COPD with chronic resp failure on home O2, and CKD who presented to the ED with near syncope. He was at his primary MD's office when he developed SOB and felt like he was going to pass out. In the ED he had atrial tachycardia with HR 120bpm. He denied palpitations. He had LEE and evidence of volume overload on exam. Dr. Ladona Ridgel suspected his SOB and near syncope were due to atrial tachycardia and BiV pacing at upper rate. He was admitted for management of atrial tach and acute on chronic systolic CHF. He was placed on IV Lasix for diuresis. Dr. Ladona Ridgel  felt his options for atrial tach were limited given the IV filter in place. Given prior history of hyperthyroidism on amiodarone, Dr. Rodman Key elected to start sotalol for recurrent and incessant SVT (likely left atrial tachycardia based on axis of p waves during SVT). Metoprolol was continued. Troponins remained negative and TSH was wnl. Ultimately catheter ablation was considered due to recurrent atrial tach despite sotalol - he was not a great candidate with multiple comorbidities, but it was felt to be the only option to control his tachycardia. The patient underwent EPS/RFA of atrial tachycardia and AV node on 06/25/13 without complication. Diuresis was continued for several more days with a net I/O of -10L and weight 283 (adm) to 271 (dc). He has had no further atrial tach since his ablation. He denies CP, SOB or any new concerns and feels like he is now at his baseline. Dr. Johney Frame has seen and examined the patient today and feels he is stable for discharge. Dr. Johney Frame gave the following recommendations for discharge: - switching to Lasix 80mg  BID today with anticipated decrease as an outpatient - continuing digoxin for now, change metoprolol to Coreg - consider starting ACEI as outpatient pending stability of renal function - 2g sodium diet, support hose - continue sotalol and consider stopping in the office if no further arrhythmias in 3 months - f/u BMET & TOC visit 1 week, [name] 3-4 weeks, Allred 3 months - d/c ASA since on Coumadin, has NICM The patient prefers to have his INR checked by his cardiologist, thus we have arranged. Given that INR was supratherapeutic on admission, will check later  this week to make sure he is not trending high again. The patient also will have his prescriptions sent locally since he uses mail order pharmacy to make sure he tolerates them before committing to 90-day refills.  Discharge Vitals: Blood pressure 127/86, pulse 83, temperature 98.6 F (37 C),  temperature source Oral, resp. rate 20, height 6\' 3"  (1.905 m), weight 271 lb 11.2 oz (123.242 kg), SpO2 91.00%.  Labs: Lab Results  Component Value Date   WBC 5.1 06/24/2013   HGB 10.9* 06/24/2013   HCT 35.1* 06/24/2013   MCV 89.3 06/24/2013   PLT 165 06/24/2013    Recent Labs Lab 06/21/13 1445  06/27/13 0540  NA 138  < > 136  K 3.9  < > 3.5  CL 101  < > 100  CO2 28  < > 28  BUN 21  < > 26*  CREATININE 1.79*  < > 1.69*  CALCIUM 9.2  < > 8.7  PROT 6.7  --   --   BILITOT 1.2  --   --   ALKPHOS 95  --   --   ALT 32  --   --   AST 45*  --   --   GLUCOSE 110*  < > 133*  < > = values in this interval not displayed.   Diagnostic Studies/Procedures   Dg Chest Port 1 View 06/21/2013   CLINICAL DATA:  Short of breath.  Weakness.  EXAM: PORTABLE CHEST - 1 VIEW  COMPARISON:  05/25/2013  FINDINGS: Cardiac silhouette is mildly enlarged. Mediastinum is normal in contour left anterior chest wall AICD is stable in well positioned.  Chronic interstitial thickening is stable. No edema or consolidation. No pleural effusion or pneumothorax.  The bony thorax is intact.  IMPRESSION: No acute cardiopulmonary disease.   Electronically Signed   By: Amie Portland M.D.   On: 06/21/2013 14:21   06/25/13 PROCEDURE PERFORMED: Electrophysiologic study and RF catheter ablation of atrial tachycardia as well as successful catheter ablation of the AV node.    Discharge Medications     Medication List    STOP taking these medications       aspirin 81 MG tablet     metoprolol succinate 50 MG 24 hr tablet  Commonly known as:  TOPROL-XL     predniSONE 20 MG tablet  Commonly known as:  DELTASONE      TAKE these medications       budesonide-formoterol 160-4.5 MCG/ACT inhaler  Commonly known as:  SYMBICORT  Inhale 2 puffs into the lungs 2 (two) times daily.     calcitRIOL 0.5 MCG capsule  Commonly known as:  ROCALTROL  Take 0.5 mcg by mouth every other day.     carvedilol 6.25 MG tablet    Commonly known as:  COREG  Take 1 tablet (6.25 mg total) by mouth 2 (two) times daily with a meal.  Start taking on:  06/29/2013     digoxin 0.125 MG tablet  Commonly known as:  LANOXIN  Take 125 mcg by mouth every morning.     furosemide 40 MG tablet  Commonly known as:  LASIX  Take 2 tablets (80 mg total) by mouth 2 (two) times daily.     omeprazole 40 MG capsule  Commonly known as:  PRILOSEC  Take 1 capsule (40 mg total) by mouth daily.     oxyCODONE 5 MG immediate release tablet  Commonly known as:  Oxy IR/ROXICODONE  Take 1 tablet (5 mg total)  by mouth every 4 (four) hours as needed for moderate pain.     PATADAY 0.2 % Soln  Generic drug:  Olopatadine HCl  Place 1 drop into both eyes every morning.     pregabalin 150 MG capsule  Commonly known as:  LYRICA  Take 150 mg by mouth 2 (two) times daily.     sotalol 160 MG tablet  Commonly known as:  BETAPACE  Take 1 tablet (160 mg total) by mouth daily.     tamsulosin 0.4 MG Caps capsule  Commonly known as:  FLOMAX  Take 0.4 mg by mouth every morning.     tiotropium 18 MCG inhalation capsule  Commonly known as:  SPIRIVA  Place 18 mcg into inhaler and inhale every morning.     ULORIC 80 MG Tabs  Generic drug:  Febuxostat  Take 80 mg by mouth every morning.     vitamin C 1000 MG tablet  Take 1,000 mg by mouth every morning.     warfarin 5 MG tablet  Commonly known as:  COUMADIN  Take 1.5 tablets (7.5 mg total) by mouth daily.     XOPENEX HFA 45 MCG/ACT inhaler  Generic drug:  levalbuterol  Inhale 2 puffs into the lungs every 6 (six) hours as needed for wheezing.        Disposition   The patient will be discharged in stable condition to home. Discharge Orders   Future Appointments Provider Department Dept Phone   06/30/2013 2:35 PM [facility] [place] Office [phone]   07/07/2013 1:30 PM Herby Abraham Beverly Sessions [facility] [place] Office [phone]   07/08/2013  11:15 AM Barbaraann Share, MD [facility] [phone]   07/27/2013 10:30 AM Lesleigh Noe, MD [facility] [phone]   Future Orders Complete By Expires   Diet - low sodium heart healthy  As directed    Comments:     2 grams sodium maximum per day   Increase activity slowly  As directed    Comments:     No driving for 2 days. No lifting over 5 lbs for 1 week. No sexual activity for 1 week. Keep procedure site clean & dry. If you notice increased pain, swelling, bleeding or pus, call/return!  You may shower, but no soaking baths/hot tubs/pools for 1 week.   Continue support hose.     Follow-up Information   Follow up with [facility] (Coumadin Clinic Visit 06/30/13 at 2:30pm)    Specialty:  Cardiology   Contact information:   [address], [address] [place] [address] [phone]      Follow up with Rick Duff, PA-C. (07/07/13 at 1:30pm with [facility])    Specialty:  Cardiology   Contact information:   [address] [place] [address] [phone]       Follow up with Lesleigh Noe, MD. (07/27/13 at 10:30am)    Specialty:  Cardiology   Contact information:   [address] [place] [address] [phone]       Follow up with Hillis Range, MD. (Our office will call you for a follow-up appointment. Please call the office if you have not heard from [place] within 1 month about this appointment.)    Specialty:  Cardiology   Contact information:   [address] [place] [address] [phone]         Duration of Discharge  Encounter: Greater than 30 minutes including physician and PA time.  Signed, Ronie Spies PA-C 06/28/2013, 12:17 PM  EP Attending  Patient seen and examined. Agree with above.  Leonia Reeves.D.

## 2013-06-28 NOTE — Progress Notes (Signed)
Placed patient on CPAP at 6cm for the night and set oxygen at 2lpm

## 2013-06-28 NOTE — Op Note (Signed)
NAME:  FAY, SWIDER NO.:  MEDICAL RECORD NO.:  [id number]  LOCATION:                                 FACILITY:  PHYSICIAN:  Doylene Canning. Ladona Ridgel, MD    DATE OF BIRTH:  07-Jul-1933  DATE OF PROCEDURE: DATE OF DISCHARGE:  06/25/2013                              OPERATIVE REPORT   PROCEDURE PERFORMED:  Electrophysiologic study and RF catheter ablation of atrial tachycardia as well as successful catheter ablation of the AV node.  INDICATIONS FOR PROCEDURE:  The patient is a 79 year old man, who presented to the hospital several days ago with incessant atrial tachycardia and congestive heart failure.  He has a long-standing dilated cardiomyopathy and chronic systolic heart failure and left bundle-branch block, status post BiV ICD insertion approximately 3 years ago.  With initiation of sotalol in the hospital, the patient initially had improved but then had recurrence of his fairly incessant atrial tachycardia.  His tachycardia would last for 8-10 hours at a time.  He became short of breath, although he did not have palpitations. Previously, he had failed amiodarone.  The patient had no reversible causes.  He is now referred for catheter ablation.  PROCEDURE IN DETAIL:  After informed consent was obtained, the patient was taken to the diagnostic EP lab in a fasting state.  After usual preparation and draping, intravenous fentanyl and midazolam was given for sedation.  A 6-French Octapolar catheter was inserted percutaneously into the right femoral vein and advanced coronary sinus.  A 6-French quadripolar catheter was inserted percutaneously in the right femoral vein and advanced to the His bundle region.  A 6-French quadripolar catheter was inserted percutaneously in the right femoral vein and advanced the right atrium.  The patient was spontaneously in atrial tachycardia.  AV dissociation was present at times.  The tachycardia was stopped spontaneously, but  could be induced with rapid atrial pacing. Mapping was carried out.  The right atrium was very large and oriented anteriorly.  Mapping of the atrial tachycardia demonstrated that the earliest atrial activation occurred at a site on the tricuspid valve annulus anterior at approximately 12 o'clock in the LAO projection.  In this location, catheter manipulation was difficult and catheter stability was even more so.  Twice RF energy application was applied to the patient's tachycardia and twice the tachycardia spontaneously broke. It was reinducible.  After the 3rd round of mapping, additional RF energy was applied to the area as previously mentioned.  Following this, rapid atrial pacing was carried out as well as catheter manipulation and the patient's tachycardia was never again seen.  At this point, I was concerned that well we appeared to have successfully ablated the patient's tachycardia.  Other atrial tachycardias would reoccur based on the patient's marked atrial enlargement.  In addition, his HV interval was 67 milliseconds.  Because his biventricular ICD was working normally and because of the high likelihood for recurrent atrial tachycardias in the future as well as atrial fibrillation and flutter, I elected to proceed with AV node ablation, which had been discussed with the patient prior to the procedure.  This was carried out with a 5 RF energy applications to the  region of the AV node, resulting in creation of complete heart block.  At this point, no additional RF energy application was carried out and rapid atrial and ventricular pacing and programed atrial and ventricular stimulation were carried out.  The results of rapid atrial pacing demonstrated an AV Wenckebach cycle length prior to ablation of 560 milliseconds.  Programed atrial stimulation and S1-S2 coupling interval of 600/540 demonstrated the AV node ERP.  Rapid ventricular pacing carried out from the right  ventricle demonstrated VA dissociation at baseline and programed ventricular stimulation prior to ablation demonstrated VA dissociation.  The patient's defibrillator was reprogramed, and he was subsequently returned to the recovery room after catheters were removed.  Hemostasis was assured in satisfactory condition.  COMPLICATIONS:  There were no immediate procedure complications.  RESULTS: 1. Baseline ECG:  Baseline ECG demonstrates atrial flutter with a     rapid ventricular response and left bundle-branch block along with     intermittent ventricular pacing. 2. Baseline intervals, the HV interval was 66 milliseconds.  The QRS     duration was 170 milliseconds.  The atrial tachycardia cycle length     was 480 milliseconds.  The QRS duration was 170 milliseconds. 3. Rapid ventricular pacing:  Rapid ventricular pacing was carried out     from the right ventricle demonstrating VA dissociation. 4. Programed ventricular stimulation:  Programed ventricular     stimulation was carried out from the right ventricle and     demonstrated VA dissociation at a pacing cycle length of 600     milliseconds. 5. Programed atrial stimulation:  Programed atrial stimulation was     carried out from the right atrium at a base drive cycle length of     600 milliseconds in the S1 and S2 interval stepwise decreased to     540 milliseconds where AV node ERP was observed. 6. Rapid atrial pacing:  Rapid atrial pacing initially demonstrated an     AV Wenckebach cycle length of 560 milliseconds.  Following ablation     of the AV node, AV dissociation was present 600 milliseconds. 7. Arrhythmias Observed:  Atrial tachycardia initiation was     spontaneous as well as with rapid atrial pacing, the duration was     sustained.  The cycle length was 480 milliseconds, and the method     of termination was with catheter ablation. 8. Mapping:  Mapping of the right atrium demonstrated an extremely     enlarged right  atrium. 9. RF energy application.  A 13 RF energy applications were applied to     the anterior lip of the tricuspid valve anulus at approximately 12     o'clock in the LAO projection.  A 5 RF energy applications were     delivered to the region of the AV node.  This resulted in rendering     the paced atrial tachycardia noninducible and rendering the     patient's AV conduction non-existent following the final RF energy     application.  CONCLUSION:  This study demonstrates successful electrophysiologic study and RF catheter ablation of atrial tachycardia with 13 RF energy applications applied to the anterior lip of the tricuspid valve annulus at 12 o'clock in the LAO projection.  Because of the patient's extremely enlarged right atrium and because of the likelihood of recurrent atrial tachycardia with rapid ventricular response and hemodynamic compromise, it was deemed most appropriate to proceed with AV node ablation in the setting of a previously implanted and  stable lead functioning biventricular ICD.  This too was carried out without complication and successfully.     Doylene Canning. Ladona Ridgel, MD     GWT/MEDQ  D:  06/25/2013  T:  06/26/2013  Job:  [id number]  cc:   Hillis Range, MD

## 2013-06-30 ENCOUNTER — Ambulatory Visit (INDEPENDENT_AMBULATORY_CARE_PROVIDER_SITE_OTHER): Payer: Medicare Other | Admitting: Pharmacist

## 2013-06-30 DIAGNOSIS — I4891 Unspecified atrial fibrillation: Secondary | ICD-10-CM

## 2013-06-30 DIAGNOSIS — I824Y9 Acute embolism and thrombosis of unspecified deep veins of unspecified proximal lower extremity: Secondary | ICD-10-CM | POA: Insufficient documentation

## 2013-06-30 LAB — POCT INR: INR: 2.2

## 2013-07-07 ENCOUNTER — Encounter: Payer: Medicare Other | Admitting: Cardiology

## 2013-07-07 DIAGNOSIS — S93439A Sprain of tibiofibular ligament of unspecified ankle, initial encounter: Secondary | ICD-10-CM | POA: Diagnosis not present

## 2013-07-07 DIAGNOSIS — S8263XA Displaced fracture of lateral malleolus of unspecified fibula, initial encounter for closed fracture: Secondary | ICD-10-CM | POA: Diagnosis not present

## 2013-07-07 DIAGNOSIS — M7989 Other specified soft tissue disorders: Secondary | ICD-10-CM | POA: Diagnosis not present

## 2013-07-07 DIAGNOSIS — M25569 Pain in unspecified knee: Secondary | ICD-10-CM | POA: Diagnosis not present

## 2013-07-08 ENCOUNTER — Ambulatory Visit: Payer: Medicare Other | Admitting: Pulmonary Disease

## 2013-07-09 DIAGNOSIS — J441 Chronic obstructive pulmonary disease with (acute) exacerbation: Secondary | ICD-10-CM | POA: Diagnosis not present

## 2013-07-09 DIAGNOSIS — J8410 Pulmonary fibrosis, unspecified: Secondary | ICD-10-CM | POA: Diagnosis not present

## 2013-07-09 DIAGNOSIS — I2589 Other forms of chronic ischemic heart disease: Secondary | ICD-10-CM | POA: Diagnosis not present

## 2013-07-09 DIAGNOSIS — N183 Chronic kidney disease, stage 3 unspecified: Secondary | ICD-10-CM | POA: Diagnosis not present

## 2013-07-09 DIAGNOSIS — I4891 Unspecified atrial fibrillation: Secondary | ICD-10-CM | POA: Diagnosis not present

## 2013-07-09 DIAGNOSIS — I82409 Acute embolism and thrombosis of unspecified deep veins of unspecified lower extremity: Secondary | ICD-10-CM | POA: Diagnosis not present

## 2013-07-12 DIAGNOSIS — I2589 Other forms of chronic ischemic heart disease: Secondary | ICD-10-CM | POA: Diagnosis not present

## 2013-07-12 DIAGNOSIS — J441 Chronic obstructive pulmonary disease with (acute) exacerbation: Secondary | ICD-10-CM | POA: Diagnosis not present

## 2013-07-12 DIAGNOSIS — I4891 Unspecified atrial fibrillation: Secondary | ICD-10-CM | POA: Diagnosis not present

## 2013-07-12 DIAGNOSIS — J8410 Pulmonary fibrosis, unspecified: Secondary | ICD-10-CM | POA: Diagnosis not present

## 2013-07-12 DIAGNOSIS — N183 Chronic kidney disease, stage 3 unspecified: Secondary | ICD-10-CM | POA: Diagnosis not present

## 2013-07-12 DIAGNOSIS — I82409 Acute embolism and thrombosis of unspecified deep veins of unspecified lower extremity: Secondary | ICD-10-CM | POA: Diagnosis not present

## 2013-07-13 ENCOUNTER — Encounter: Payer: Medicare Other | Admitting: Cardiology

## 2013-07-14 ENCOUNTER — Telehealth: Payer: Self-pay | Admitting: Pulmonary Disease

## 2013-07-14 DIAGNOSIS — I2589 Other forms of chronic ischemic heart disease: Secondary | ICD-10-CM | POA: Diagnosis not present

## 2013-07-14 DIAGNOSIS — I82409 Acute embolism and thrombosis of unspecified deep veins of unspecified lower extremity: Secondary | ICD-10-CM | POA: Diagnosis not present

## 2013-07-14 DIAGNOSIS — J441 Chronic obstructive pulmonary disease with (acute) exacerbation: Secondary | ICD-10-CM | POA: Diagnosis not present

## 2013-07-14 DIAGNOSIS — N183 Chronic kidney disease, stage 3 unspecified: Secondary | ICD-10-CM | POA: Diagnosis not present

## 2013-07-14 DIAGNOSIS — I4891 Unspecified atrial fibrillation: Secondary | ICD-10-CM | POA: Diagnosis not present

## 2013-07-14 DIAGNOSIS — J8410 Pulmonary fibrosis, unspecified: Secondary | ICD-10-CM | POA: Diagnosis not present

## 2013-07-14 MED ORDER — BUDESONIDE-FORMOTEROL FUMARATE 160-4.5 MCG/ACT IN AERO: 2.0000 | INHALATION_SPRAY | Freq: Two times a day (BID) | RESPIRATORY_TRACT | Status: DC

## 2013-07-14 NOTE — Telephone Encounter (Signed)
Called and spoke with pt. He did not need xopenex sent he needed his [facility]. I have done so. Nothing further needed

## 2013-07-16 ENCOUNTER — Encounter: Payer: Self-pay | Admitting: Internal Medicine

## 2013-07-19 DIAGNOSIS — I2589 Other forms of chronic ischemic heart disease: Secondary | ICD-10-CM | POA: Diagnosis not present

## 2013-07-19 DIAGNOSIS — I82409 Acute embolism and thrombosis of unspecified deep veins of unspecified lower extremity: Secondary | ICD-10-CM | POA: Diagnosis not present

## 2013-07-19 DIAGNOSIS — J8410 Pulmonary fibrosis, unspecified: Secondary | ICD-10-CM | POA: Diagnosis not present

## 2013-07-19 DIAGNOSIS — N183 Chronic kidney disease, stage 3 unspecified: Secondary | ICD-10-CM | POA: Diagnosis not present

## 2013-07-19 DIAGNOSIS — J441 Chronic obstructive pulmonary disease with (acute) exacerbation: Secondary | ICD-10-CM | POA: Diagnosis not present

## 2013-07-19 DIAGNOSIS — I4891 Unspecified atrial fibrillation: Secondary | ICD-10-CM | POA: Diagnosis not present

## 2013-07-20 ENCOUNTER — Ambulatory Visit: Payer: Medicare Other | Admitting: Interventional Cardiology

## 2013-07-21 DIAGNOSIS — J441 Chronic obstructive pulmonary disease with (acute) exacerbation: Secondary | ICD-10-CM | POA: Diagnosis not present

## 2013-07-21 DIAGNOSIS — I4891 Unspecified atrial fibrillation: Secondary | ICD-10-CM | POA: Diagnosis not present

## 2013-07-21 DIAGNOSIS — I2589 Other forms of chronic ischemic heart disease: Secondary | ICD-10-CM | POA: Diagnosis not present

## 2013-07-21 DIAGNOSIS — N183 Chronic kidney disease, stage 3 unspecified: Secondary | ICD-10-CM | POA: Diagnosis not present

## 2013-07-21 DIAGNOSIS — J8410 Pulmonary fibrosis, unspecified: Secondary | ICD-10-CM | POA: Diagnosis not present

## 2013-07-21 DIAGNOSIS — I82409 Acute embolism and thrombosis of unspecified deep veins of unspecified lower extremity: Secondary | ICD-10-CM | POA: Diagnosis not present

## 2013-07-26 DIAGNOSIS — I82409 Acute embolism and thrombosis of unspecified deep veins of unspecified lower extremity: Secondary | ICD-10-CM | POA: Diagnosis not present

## 2013-07-26 DIAGNOSIS — Z8679 Personal history of other diseases of the circulatory system: Secondary | ICD-10-CM | POA: Diagnosis not present

## 2013-07-26 DIAGNOSIS — N039 Chronic nephritic syndrome with unspecified morphologic changes: Secondary | ICD-10-CM | POA: Diagnosis not present

## 2013-07-26 DIAGNOSIS — J8410 Pulmonary fibrosis, unspecified: Secondary | ICD-10-CM | POA: Diagnosis not present

## 2013-07-26 DIAGNOSIS — N183 Chronic kidney disease, stage 3 unspecified: Secondary | ICD-10-CM | POA: Diagnosis not present

## 2013-07-26 DIAGNOSIS — I4891 Unspecified atrial fibrillation: Secondary | ICD-10-CM | POA: Diagnosis not present

## 2013-07-26 DIAGNOSIS — I2589 Other forms of chronic ischemic heart disease: Secondary | ICD-10-CM | POA: Diagnosis not present

## 2013-07-26 DIAGNOSIS — N2581 Secondary hyperparathyroidism of renal origin: Secondary | ICD-10-CM | POA: Diagnosis not present

## 2013-07-26 DIAGNOSIS — J441 Chronic obstructive pulmonary disease with (acute) exacerbation: Secondary | ICD-10-CM | POA: Diagnosis not present

## 2013-07-26 DIAGNOSIS — D631 Anemia in chronic kidney disease: Secondary | ICD-10-CM | POA: Diagnosis not present

## 2013-07-27 ENCOUNTER — Ambulatory Visit (INDEPENDENT_AMBULATORY_CARE_PROVIDER_SITE_OTHER): Payer: Medicare Other | Admitting: Interventional Cardiology

## 2013-07-27 ENCOUNTER — Ambulatory Visit (INDEPENDENT_AMBULATORY_CARE_PROVIDER_SITE_OTHER): Payer: Medicare Other | Admitting: Pharmacist

## 2013-07-27 ENCOUNTER — Encounter: Payer: Self-pay | Admitting: Interventional Cardiology

## 2013-07-27 VITALS — BP 126/72 | HR 84 | Ht 75.0 in | Wt 281.0 lb

## 2013-07-27 DIAGNOSIS — Z7901 Long term (current) use of anticoagulants: Secondary | ICD-10-CM

## 2013-07-27 DIAGNOSIS — N183 Chronic kidney disease, stage 3 unspecified: Secondary | ICD-10-CM | POA: Diagnosis not present

## 2013-07-27 DIAGNOSIS — I10 Essential (primary) hypertension: Secondary | ICD-10-CM | POA: Diagnosis not present

## 2013-07-27 DIAGNOSIS — Z5181 Encounter for therapeutic drug level monitoring: Secondary | ICD-10-CM | POA: Insufficient documentation

## 2013-07-27 DIAGNOSIS — I519 Heart disease, unspecified: Secondary | ICD-10-CM

## 2013-07-27 DIAGNOSIS — I498 Other specified cardiac arrhythmias: Secondary | ICD-10-CM

## 2013-07-27 DIAGNOSIS — I4891 Unspecified atrial fibrillation: Secondary | ICD-10-CM | POA: Diagnosis not present

## 2013-07-27 DIAGNOSIS — I824Y9 Acute embolism and thrombosis of unspecified deep veins of unspecified proximal lower extremity: Secondary | ICD-10-CM

## 2013-07-27 DIAGNOSIS — Z79899 Other long term (current) drug therapy: Secondary | ICD-10-CM

## 2013-07-27 DIAGNOSIS — I471 Supraventricular tachycardia: Secondary | ICD-10-CM

## 2013-07-27 LAB — POCT INR: INR: 2.9

## 2013-07-27 MED ORDER — SOTALOL HCL 160 MG PO TABS: 160.0000 mg | ORAL_TABLET | Freq: Every day | ORAL | Status: DC

## 2013-07-27 NOTE — Progress Notes (Signed)
Patient ID: Maurice Delgado, male   DOB: [birth year], 80 y.o.   MRN: [id number] Past Medical History  Nonischemic cardiomyopathy, LVEF 35% by echo   DVT, recurrent, requiring history vena caval filter   Chronic atrial fibrillation, failed amiodarone due to aggravation of thyroid disease.   Renal insufficiency   Hyperthyroidism (multinodular goiter and amiodarone therapy) [Dr. Kerr]   Gout   AICD/Defibrillator      [address] [place], [place]  [address] Phone: [phone] Fax:  [phone]  Date:  07/27/2013   ID:  Maurice Delgado, DOB 17-Sep-1933, MRN [id number]  PCP:  Barbette Merino, MD   ASSESSMENT:  1. Chronic systolic heart failure, borderline compensated 2. Incessant atrial tachycardia now improved status post ablation and institution of sotalol therapy 3. Chronic Coumadin therapy  4  O2 dependent chronic lung disease 4. Chronic kidney disease with fluctuating GFR do to intensity of diuretic regimen  PLAN:  1. Refill sotalol. The patient is cautioned that this medication will likely be discontinued in March when he sees EP. May need to be discontinued prior to then depending upon renal function. 2. The patient will need to have renal function monitored closely on his higher dose of furosemide as this will impact clearance of sotalol. I will alert Dr. Jimmy Footman. 3. Agree with increase in diuretic regimen prescribed by Dr. Jimmy Footman to relieve lower extremity edema and heart failure 4. Six-month followup  SUBJECTIVE: Maurice Delgado is a 80 y.o. male has complicated medical issues. They include chronic systolic heart failure, paroxysmal atrial fibrillation, incessant atrial tachycardia (status improved post ablation) renal insufficiency, chronic right heart failure with lower extremity edema/venous insufficiency/IVC umbrella). Recent furosemide adjustment to 120 mg twice a day by nephrology. He is on chronic O2. He denies orthopnea. No chest pain. No  recurrent syncope . The patient does have a defibrillator.  He has not had syncope. He denies orthopnea. Lower extremity swelling and is continuing and significant. This led to the recent increase in diuretic intensity.  Wt Readings from Last 3 Encounters:  07/27/13 281 lb (127.461 kg)  06/28/13 271 lb 11.2 oz (123.242 kg)  06/28/13 271 lb 11.2 oz (123.242 kg)     Past Medical History  Diagnosis Date  . Nonischemic cardiomyopathy     a. EF 35%. b. s/p CRT-D device implanted - initially 2002 in [place], gen change 2011 (MDT).  . HTN (hypertension)   . DVT (deep venous thrombosis)     a. Recurrent DVTs. b. s/p IVC filter 2007. c. DVTs dx 05/2013 - started back on Coumadin  . Gout   . COPD (chronic obstructive pulmonary disease)   . Chronic systolic CHF (congestive heart failure)   . PAF (paroxysmal atrial fibrillation)   . GERD (gastroesophageal reflux disease)   . Left fibular fracture     a. 03/2013 - nonsurgical mgmt.  . Hemoptysis     a. H/o of hemoptysis with pulmonary fibrosis (patient has refused biopsies in the past).  . OSA on CPAP   . Chronic kidney disease (CKD), stage III (moderate)   . Chronic respiratory failure     a. On home O2 - 2-3L 24/7  . Retroperitoneal bleed     a. Remotely on Coumadin (per Dr. Jackalyn Lombard prior office note).  . 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  . Atrial tachycardia     a. s/p ablation 05/2013.    Current Outpatient Prescriptions  Medication Sig Dispense Refill  . Ascorbic Acid (VITAMIN C)  1000 MG tablet Take 1,000 mg by mouth every morning.       . budesonide-formoterol (SYMBICORT) 160-4.5 MCG/ACT inhaler Inhale 2 puffs into the lungs 2 (two) times daily.  3 Inhaler  1  . calcitRIOL (ROCALTROL) 0.5 MCG capsule Take 0.5 mcg by mouth every other day.       . carvedilol (COREG) 6.25 MG tablet Take 1 tablet (6.25 mg total) by mouth 2 (two) times daily with a meal.  60 tablet  2  . digoxin (LANOXIN) 0.125 MG tablet Take 125 mcg by mouth  every morning.       . Febuxostat (ULORIC) 80 MG TABS Take 80 mg by mouth every morning.       . furosemide (LASIX) 40 MG tablet Take 2 tablets (80 mg total) by mouth 2 (two) times daily.      Marland Kitchen levalbuterol (XOPENEX HFA) 45 MCG/ACT inhaler Inhale 2 puffs into the lungs every 6 (six) hours as needed for wheezing.       . Olopatadine HCl (PATADAY) 0.2 % SOLN Place 1 drop into both eyes every morning.      Marland Kitchen omeprazole (PRILOSEC) 40 MG capsule Take 1 capsule (40 mg total) by mouth daily.  30 capsule  0  . oxyCODONE (OXY IR/ROXICODONE) 5 MG immediate release tablet Take 1 tablet (5 mg total) by mouth every 4 (four) hours as needed for moderate pain.  30 tablet  0  . pregabalin (LYRICA) 150 MG capsule Take 150 mg by mouth 2 (two) times daily.        . sotalol (BETAPACE) 160 MG tablet Take 1 tablet (160 mg total) by mouth daily.  30 tablet  2  . Tamsulosin HCl (FLOMAX) 0.4 MG CAPS Take 0.4 mg by mouth every morning.       . tiotropium (SPIRIVA) 18 MCG inhalation capsule Place 18 mcg into inhaler and inhale every morning.       . warfarin (COUMADIN) 5 MG tablet Take 1.5 tablets (7.5 mg total) by mouth daily.       No current facility-administered medications for this visit.    Allergies:    Allergies  Allergen Reactions  . Amiodarone     hyperthyroidism    Social History:  The patient  reports that he has quit smoking. His smoking use included Cigarettes. He has a 45 pack-year smoking history. He has never used smokeless tobacco. He reports that he uses illicit drugs (Marijuana). He reports that he does not drink alcohol.   ROS:  Please see the history of present illness.   No bleeding on Coumadin. No neurological complaints. Chronic O2 therapy is new.   All other systems reviewed and negative.   OBJECTIVE: VS:  BP 126/72  Pulse 84  Ht 6\' 3"  (1.905 m)  Wt 281 lb (127.461 kg)  BMI 35.12 kg/m2 Well nourished, well developed, in no acute distress, obese HEENT: normal Neck: JVD difficult to  evaluate. Carotid bruit absent  Cardiac:  normal S1, S2; IRR; 2/6 apical systolic  murmur. Probable soft S3 gallop Lungs:  clear to auscultation bilaterally, no wheezing, rhonchi or rales Abd: soft, nontender, no hepatomegaly Ext: Edema  2-3+ bilateral left leg greater than right . Pulses 2+ posterior tibial  Skin: warm and dry Neuro:  CNs 2-12 intact, no focal abnormalities noted  EKG:  Not performed     ECHOCARDIOGRAM: 2014 Study Conclusions  - Left ventricle: The cavity size was moderately dilated. Systolic function was severely reduced. The estimated ejection fraction  was in the range of 20% to 25%. Diffuse hypokinesis. Severe hypokinesis. - Ventricular septum: Septal motion showed paradox. - Aortic valve: Mild regurgitation. - Mitral valve: Mild regurgitation. - Left atrium: The atrium was mildly dilated. - Atrial septum: No defect or patent foramen ovale was identified. - Pulmonary arteries: PA peak pressure: 42mm Hg (S).   Signed, Illene Labrador III, MD 07/27/2013 10:51 AM

## 2013-07-27 NOTE — Patient Instructions (Signed)
Your physician recommends that you continue on your current medications as directed. Please refer to the Current Medication list given to you today.  You will have your INR checked today with our Coumadin clinic  A refill for Sotalol has been sent to your pharmacy  Your physician wants you to follow-up in: 6 months You will receive a reminder letter in the mail two months in advance. If you don't receive a letter, please call our office to schedule the follow-up appointment.

## 2013-07-28 DIAGNOSIS — I4891 Unspecified atrial fibrillation: Secondary | ICD-10-CM | POA: Diagnosis not present

## 2013-07-28 DIAGNOSIS — J441 Chronic obstructive pulmonary disease with (acute) exacerbation: Secondary | ICD-10-CM | POA: Diagnosis not present

## 2013-07-28 DIAGNOSIS — I2589 Other forms of chronic ischemic heart disease: Secondary | ICD-10-CM | POA: Diagnosis not present

## 2013-07-28 DIAGNOSIS — N183 Chronic kidney disease, stage 3 unspecified: Secondary | ICD-10-CM | POA: Diagnosis not present

## 2013-07-28 DIAGNOSIS — I82409 Acute embolism and thrombosis of unspecified deep veins of unspecified lower extremity: Secondary | ICD-10-CM | POA: Diagnosis not present

## 2013-07-28 DIAGNOSIS — J8410 Pulmonary fibrosis, unspecified: Secondary | ICD-10-CM | POA: Diagnosis not present

## 2013-08-03 ENCOUNTER — Telehealth: Payer: Self-pay | Admitting: *Deleted

## 2013-08-03 NOTE — Telephone Encounter (Signed)
[facility] called stating that [facility] needs more info to fill sotalol for patient. The number he gave me to call is [phone]. Thanks, MI

## 2013-08-04 ENCOUNTER — Encounter: Payer: Self-pay | Admitting: Pulmonary Disease

## 2013-08-04 ENCOUNTER — Ambulatory Visit (INDEPENDENT_AMBULATORY_CARE_PROVIDER_SITE_OTHER): Payer: Medicare Other | Admitting: Pulmonary Disease

## 2013-08-04 VITALS — BP 100/58 | HR 81 | Temp 97.9°F | Ht 74.0 in | Wt 284.8 lb

## 2013-08-04 DIAGNOSIS — G4733 Obstructive sleep apnea (adult) (pediatric): Secondary | ICD-10-CM

## 2013-08-04 DIAGNOSIS — J449 Chronic obstructive pulmonary disease, unspecified: Secondary | ICD-10-CM | POA: Diagnosis not present

## 2013-08-04 DIAGNOSIS — R042 Hemoptysis: Secondary | ICD-10-CM | POA: Diagnosis not present

## 2013-08-04 NOTE — Patient Instructions (Signed)
Continue on your breathing meds, and stay on oxygen as well. Will keep you on coumadin as long as you are not coughing up blood. Work on weight loss and conditioning. followup with me again in 68mos.

## 2013-08-04 NOTE — Assessment & Plan Note (Signed)
The patient is no longer having hemoptysis, despite being on Coumadin for his chronic DVT. It is really unclear as to why he was having the hemoptysis in the first place, but I was concerned about lower lobe infiltrates and some type of inflammatory or vasculitic process. We will continue Coumadin for his chronic DVT as long as he is not coughing up further blood.

## 2013-08-04 NOTE — Assessment & Plan Note (Signed)
The patient appears to be at baseline from a COPD standpoint, with no bronchospasm on exam today. I've asked him to continue on his maintenance bronchodilator regimen, and stressed to him the importance of staying on his supplemental oxygen.

## 2013-08-04 NOTE — Progress Notes (Signed)
   Subjective:    Patient ID: Maurice Delgado, male    DOB: 1933/09/22, 80 y.o.   MRN: [id number]  HPI Patient comes in today for followup of his known COPD, as well as his chronic hemoptysis. He was in the hospital at the end of last year with acute on chronic DVT in his left lower extremity, and was recently on Coumadin despite his history of hemoptysis. There was concerns about the development of a compartment syndrome or possibly renal vein thrombosis. The patient has been on Coumadin since that time, and surprisingly his hemoptysis has essentially resolved. He is also been in the hospital recently for an ablation to treat a tachyarrhythmia. Currently, he feels that his COPD is at baseline, and he is staying on his bronchodilator regimen. Unfortunately, he did not wear his oxygen today because he "forgot" it.    Review of Systems  Constitutional: Negative for fever and unexpected weight change.  HENT: Negative for congestion, dental problem, ear pain, nosebleeds, postnasal drip, rhinorrhea, sinus pressure, sneezing, sore throat and trouble swallowing.   Eyes: Negative for redness and itching.  Respiratory: Positive for cough, shortness of breath and wheezing. Negative for chest tightness.   Cardiovascular: Negative for palpitations and leg swelling.  Gastrointestinal: Negative for nausea and vomiting.  Genitourinary: Negative for dysuria.  Musculoskeletal: Negative for joint swelling.  Skin: Negative for rash.  Neurological: Negative for headaches.  Hematological: Does not bruise/bleed easily.  Psychiatric/Behavioral: Negative for dysphoric mood. The patient is not nervous/anxious.        Objective:   Physical Exam Overweight male in no acute distress Nose without purulence or discharge noted No skin breakdown or pressure necrosis from the CPAP mask. Neck without lymphadenopathy or thyromegaly Chest with basilar crackles right greater than left, no wheezing Cardiac exam with  distant heart sounds, but sounds regular Lower extremities with 3+ edema on the left and 2+ edema on the right. No cyanosis noted. Alert and oriented, moves all 4 extremities.       Assessment & Plan:

## 2013-08-05 NOTE — Telephone Encounter (Signed)
Multiple calls to [facility] and [facility] to clarify and resubmit prescription for patients sotalol, unsure of what happened with script because we do have confirmation 07/27/2013. I will call and let patient know.

## 2013-08-06 NOTE — Telephone Encounter (Signed)
Noted  

## 2013-08-10 ENCOUNTER — Telehealth: Payer: Self-pay | Admitting: Pulmonary Disease

## 2013-08-10 MED ORDER — TIOTROPIUM BROMIDE MONOHYDRATE 18 MCG IN CAPS: 18.0000 ug | ORAL_CAPSULE | Freq: Every morning | RESPIRATORY_TRACT | Status: DC

## 2013-08-10 NOTE — Telephone Encounter (Signed)
Called and spoke with pt. Aware will send RX nothing further needed

## 2013-08-18 ENCOUNTER — Other Ambulatory Visit (HOSPITAL_BASED_OUTPATIENT_CLINIC_OR_DEPARTMENT_OTHER): Payer: Medicare Other

## 2013-08-20 ENCOUNTER — Telehealth: Payer: Self-pay | Admitting: Pulmonary Disease

## 2013-08-20 DIAGNOSIS — G4733 Obstructive sleep apnea (adult) (pediatric): Secondary | ICD-10-CM

## 2013-08-20 NOTE — Telephone Encounter (Signed)
Ok with me 

## 2013-08-20 NOTE — Telephone Encounter (Signed)
Per [place] ok to change DME to [facility] from [place], pt aware order was placed and will be contacted next week Nothing further needed

## 2013-08-20 NOTE — Telephone Encounter (Signed)
Called and spoke with pt and he stated that he would like to change from [facility]  To another DME company---pts wife would like to use [employer]. [place] please advise if ok to change DME company for supplies.  thanks

## 2013-08-24 ENCOUNTER — Ambulatory Visit (INDEPENDENT_AMBULATORY_CARE_PROVIDER_SITE_OTHER): Payer: Medicare Other | Admitting: Pharmacist

## 2013-08-24 ENCOUNTER — Telehealth: Payer: Self-pay | Admitting: Pharmacist

## 2013-08-24 DIAGNOSIS — I824Y9 Acute embolism and thrombosis of unspecified deep veins of unspecified proximal lower extremity: Secondary | ICD-10-CM | POA: Diagnosis not present

## 2013-08-24 DIAGNOSIS — I4891 Unspecified atrial fibrillation: Secondary | ICD-10-CM

## 2013-08-24 DIAGNOSIS — I5023 Acute on chronic systolic (congestive) heart failure: Secondary | ICD-10-CM

## 2013-08-24 DIAGNOSIS — Z5181 Encounter for therapeutic drug level monitoring: Secondary | ICD-10-CM

## 2013-08-24 LAB — POCT INR: INR: 3.8

## 2013-08-24 NOTE — Telephone Encounter (Signed)
Patient wanted to let me know that he is taking furosemide 120 mg bid (not 80 mg bid).  He's not sure who put him on this, but he thinks he's been doing this for 6-8 weeks.  He was discharged from hospital 06/28/14 on 80 bid and last time you saw him 4 weeks ago you told him to continue his same dose (which per records was 80 mg bid).  Patient wants to know if he should continue his current dose he's taking (120 mg bid) or if he should reduce to 80 mg bid.  He is not having any SOB.  He is not weighing himself at home.  Lattie Haw, could you call his pharmacy and check on dose/quantity he gets and who prescribed his furosemide last. Thanks.  To Dr. Tamala Julian and Lattie Haw.

## 2013-08-25 NOTE — Telephone Encounter (Signed)
Continue current dose. Needs BMET, 428.23. We need to update med list.

## 2013-08-27 MED ORDER — FUROSEMIDE 40 MG PO TABS: 120.0000 mg | ORAL_TABLET | Freq: Two times a day (BID) | ORAL | Status: DC

## 2013-08-27 NOTE — Telephone Encounter (Signed)
lmom.Continue current doseof lasix 120mg  bid.med list updated.pt needs to call the office to sch a lab appt.for a bmet

## 2013-08-31 NOTE — Telephone Encounter (Signed)
2nd attempt.called pt with Dr.Smith instructions.pt is to continue lasix 120mg  bid of lasix.pt will come in to [facility] lab drawn(bmet) sameday as his cvrr appt 09/07/13.pt agreeable with plan and verbalized understanding.

## 2013-09-03 ENCOUNTER — Telehealth: Payer: Self-pay | Admitting: Pulmonary Disease

## 2013-09-03 NOTE — Telephone Encounter (Signed)
Pt states that he had not heard anything back form [employer] and would like an update. Pt aware that [facility] will be back on Monday and I will forward to [facility] box to help find out what is going on with the order.  PCC's please advise. Thanks.

## 2013-09-06 NOTE — Telephone Encounter (Signed)
lmtcb Sally E Ottinger ° °

## 2013-09-07 ENCOUNTER — Ambulatory Visit (INDEPENDENT_AMBULATORY_CARE_PROVIDER_SITE_OTHER): Payer: Medicare Other | Admitting: *Deleted

## 2013-09-07 ENCOUNTER — Other Ambulatory Visit (INDEPENDENT_AMBULATORY_CARE_PROVIDER_SITE_OTHER): Payer: Medicare Other

## 2013-09-07 DIAGNOSIS — I824Y9 Acute embolism and thrombosis of unspecified deep veins of unspecified proximal lower extremity: Secondary | ICD-10-CM

## 2013-09-07 DIAGNOSIS — Z5181 Encounter for therapeutic drug level monitoring: Secondary | ICD-10-CM

## 2013-09-07 DIAGNOSIS — I4891 Unspecified atrial fibrillation: Secondary | ICD-10-CM

## 2013-09-07 DIAGNOSIS — I5023 Acute on chronic systolic (congestive) heart failure: Secondary | ICD-10-CM | POA: Diagnosis not present

## 2013-09-07 LAB — BASIC METABOLIC PANEL
BUN: 23 mg/dL (ref 6–23)
CHLORIDE: 102 meq/L (ref 96–112)
CO2: 33 mEq/L — ABNORMAL HIGH (ref 19–32)
CREATININE: 1.7 mg/dL — AB (ref 0.4–1.5)
Calcium: 9.7 mg/dL (ref 8.4–10.5)
GFR: 50.03 mL/min — ABNORMAL LOW (ref >60.00)
GLUCOSE: 89 mg/dL (ref 70–99)
POTASSIUM: 4.1 meq/L (ref 3.5–5.1)
Sodium: 142 mEq/L (ref 135–145)

## 2013-09-07 LAB — POCT INR: INR: 3.2

## 2013-09-07 NOTE — Telephone Encounter (Signed)
Spoke to [email or web address]  she will be contacting pt end of this week pt is aware Maurice Delgado

## 2013-09-08 ENCOUNTER — Telehealth: Payer: Self-pay | Admitting: *Deleted

## 2013-09-08 NOTE — Telephone Encounter (Signed)
Message copied by Dionicio Stall on Wed Sep 08, 2013  9:28 AM ------      Message from: Alvis Lemmings C      Created: Thu Jul 08, 2013 12:19 PM       Was screening for possible ICM clinc, but found:            1) [place]- patient cancelled all appts for post ablation follow up.            2) Device clinic- He has no remote follow ups at all scheduled. I think he may be due in 2/15            I wasn't sure how complaint he would be, may be one of you guys has some insight. Amber thought he spoke [place]. If compliance is an issue, I won't follow him.            Thanks! ------

## 2013-09-09 ENCOUNTER — Telehealth: Payer: Self-pay

## 2013-09-09 NOTE — Telephone Encounter (Signed)
pt given lab results.Stable kidney function and potassium.pt verbalized understanding.

## 2013-09-09 NOTE — Telephone Encounter (Signed)
Message copied by Lamar Laundry on Thu Sep 09, 2013  2:20 PM ------      Message from: Daneen Schick      Created: Thu Sep 09, 2013 11:37 AM       Stable kidney function and potassium ------

## 2013-09-21 ENCOUNTER — Ambulatory Visit (INDEPENDENT_AMBULATORY_CARE_PROVIDER_SITE_OTHER): Payer: Medicare Other

## 2013-09-21 DIAGNOSIS — I4891 Unspecified atrial fibrillation: Secondary | ICD-10-CM | POA: Diagnosis not present

## 2013-09-21 DIAGNOSIS — I824Y9 Acute embolism and thrombosis of unspecified deep veins of unspecified proximal lower extremity: Secondary | ICD-10-CM

## 2013-09-21 DIAGNOSIS — Z5181 Encounter for therapeutic drug level monitoring: Secondary | ICD-10-CM

## 2013-09-21 LAB — POCT INR: INR: 3

## 2013-09-22 DIAGNOSIS — H2510 Age-related nuclear cataract, unspecified eye: Secondary | ICD-10-CM | POA: Diagnosis not present

## 2013-10-14 ENCOUNTER — Ambulatory Visit (INDEPENDENT_AMBULATORY_CARE_PROVIDER_SITE_OTHER): Payer: Medicare Other | Admitting: *Deleted

## 2013-10-14 DIAGNOSIS — I824Y9 Acute embolism and thrombosis of unspecified deep veins of unspecified proximal lower extremity: Secondary | ICD-10-CM | POA: Diagnosis not present

## 2013-10-14 DIAGNOSIS — I4891 Unspecified atrial fibrillation: Secondary | ICD-10-CM | POA: Diagnosis not present

## 2013-10-14 DIAGNOSIS — Z5181 Encounter for therapeutic drug level monitoring: Secondary | ICD-10-CM | POA: Diagnosis not present

## 2013-10-14 LAB — POCT INR: INR: 2.4

## 2013-10-20 DIAGNOSIS — N183 Chronic kidney disease, stage 3 unspecified: Secondary | ICD-10-CM | POA: Diagnosis not present

## 2013-10-20 DIAGNOSIS — N2581 Secondary hyperparathyroidism of renal origin: Secondary | ICD-10-CM | POA: Diagnosis not present

## 2013-10-20 DIAGNOSIS — D631 Anemia in chronic kidney disease: Secondary | ICD-10-CM | POA: Diagnosis not present

## 2013-10-20 DIAGNOSIS — N039 Chronic nephritic syndrome with unspecified morphologic changes: Secondary | ICD-10-CM | POA: Diagnosis not present

## 2013-10-20 DIAGNOSIS — N289 Disorder of kidney and ureter, unspecified: Secondary | ICD-10-CM | POA: Diagnosis not present

## 2013-11-03 ENCOUNTER — Telehealth: Payer: Self-pay | Admitting: *Deleted

## 2013-11-03 ENCOUNTER — Other Ambulatory Visit: Payer: Self-pay | Admitting: *Deleted

## 2013-11-03 ENCOUNTER — Telehealth: Payer: Self-pay | Admitting: Interventional Cardiology

## 2013-11-03 MED ORDER — DIGOXIN 125 MCG PO TABS: 125.0000 ug | ORAL_TABLET | Freq: Every morning | ORAL | Status: DC

## 2013-11-03 NOTE — Telephone Encounter (Signed)
error 

## 2013-11-03 NOTE — Telephone Encounter (Signed)
Patient called for carvedilol refill. He stated that he has been taking 3.125 bid, but the office note has 6.25bid. Please advise on which one the patient is to be taking. Thanks, MI

## 2013-11-05 ENCOUNTER — Other Ambulatory Visit: Payer: Self-pay | Admitting: *Deleted

## 2013-11-05 MED ORDER — CARVEDILOL 3.125 MG PO TABS: 3.1250 mg | ORAL_TABLET | Freq: Two times a day (BID) | ORAL | Status: DC

## 2013-11-08 ENCOUNTER — Other Ambulatory Visit: Payer: Self-pay | Admitting: *Deleted

## 2013-11-08 DIAGNOSIS — M159 Polyosteoarthritis, unspecified: Secondary | ICD-10-CM | POA: Diagnosis not present

## 2013-11-08 DIAGNOSIS — E785 Hyperlipidemia, unspecified: Secondary | ICD-10-CM | POA: Diagnosis not present

## 2013-11-08 DIAGNOSIS — I10 Essential (primary) hypertension: Secondary | ICD-10-CM | POA: Diagnosis not present

## 2013-11-08 MED ORDER — DIGOXIN 125 MCG PO TABS: 125.0000 ug | ORAL_TABLET | Freq: Every morning | ORAL | Status: DC

## 2013-11-08 MED ORDER — CARVEDILOL 3.125 MG PO TABS: 3.1250 mg | ORAL_TABLET | Freq: Two times a day (BID) | ORAL | Status: DC

## 2013-11-08 MED ORDER — SOTALOL HCL 160 MG PO TABS: 160.0000 mg | ORAL_TABLET | Freq: Every day | ORAL | Status: DC

## 2013-11-11 ENCOUNTER — Ambulatory Visit (INDEPENDENT_AMBULATORY_CARE_PROVIDER_SITE_OTHER): Payer: Medicare Other | Admitting: Pharmacist

## 2013-11-11 DIAGNOSIS — I824Y9 Acute embolism and thrombosis of unspecified deep veins of unspecified proximal lower extremity: Secondary | ICD-10-CM

## 2013-11-11 DIAGNOSIS — I4891 Unspecified atrial fibrillation: Secondary | ICD-10-CM | POA: Diagnosis not present

## 2013-11-11 DIAGNOSIS — Z5181 Encounter for therapeutic drug level monitoring: Secondary | ICD-10-CM

## 2013-11-11 LAB — POCT INR: INR: 3

## 2013-12-02 ENCOUNTER — Encounter (INDEPENDENT_AMBULATORY_CARE_PROVIDER_SITE_OTHER): Payer: Self-pay

## 2013-12-02 ENCOUNTER — Ambulatory Visit: Payer: Medicare Other | Admitting: Pulmonary Disease

## 2013-12-02 ENCOUNTER — Ambulatory Visit (INDEPENDENT_AMBULATORY_CARE_PROVIDER_SITE_OTHER): Payer: Medicare Other | Admitting: Pulmonary Disease

## 2013-12-02 ENCOUNTER — Encounter: Payer: Self-pay | Admitting: Pulmonary Disease

## 2013-12-02 VITALS — BP 120/80 | HR 76 | Temp 97.8°F | Ht 74.0 in | Wt 275.0 lb

## 2013-12-02 DIAGNOSIS — J449 Chronic obstructive pulmonary disease, unspecified: Secondary | ICD-10-CM

## 2013-12-02 DIAGNOSIS — G4733 Obstructive sleep apnea (adult) (pediatric): Secondary | ICD-10-CM

## 2013-12-02 NOTE — Assessment & Plan Note (Signed)
The patient tells me that he is wearing CPAP compliantly, but occasionally feels that his machine is blowing out of control. I will need to get a download off his device to try and trouble shoot problem.

## 2013-12-02 NOTE — Progress Notes (Signed)
   Subjective:    Patient ID: Maurice Delgado, male    DOB: February 22, 1934, 80 y.o.   MRN: [id number]  HPI The patient comes in today for followup of his known COPD and obstructive sleep apnea. He is wearing CPAP compliantly, but is having some issues with the pressure on his device. We have not seen a download in quite some time, and we'll obtain this in order to evaluate his complaints. As far as his breathing, he feels that he is at his usual baseline, and has had no acute worsening. He continues on Coumadin for his DVT, and is staying on his bronchodilator regimen. He is using his portable concentrator compliantly, and feels that consistent oxygen has helped him.   Review of Systems  Constitutional: Negative for fever and unexpected weight change.  HENT: Negative for congestion, dental problem, ear pain, nosebleeds, postnasal drip, rhinorrhea, sinus pressure, sneezing, sore throat and trouble swallowing.   Eyes: Negative for redness and itching.  Respiratory: Positive for cough and shortness of breath. Negative for chest tightness and wheezing.   Cardiovascular: Negative for palpitations and leg swelling.  Gastrointestinal: Negative for nausea and vomiting.  Genitourinary: Negative for dysuria.  Musculoskeletal: Negative for joint swelling.  Skin: Negative for rash.  Neurological: Negative for headaches.  Hematological: Does not bruise/bleed easily.  Psychiatric/Behavioral: Negative for dysphoric mood. The patient is not nervous/anxious.        Objective:   Physical Exam Overweight male in no acute distress Nose without purulence or discharge noted No skin breakdown or pressure necrosis from the CPAP mask Neck without lymphadenopathy or thyromegaly Chest with mildly decreased breath sounds, a few basilar crackles, otherwise clear Cardiac exam with regular rate and rhythm Lower extremities with 2+ edema, no cyanosis Alert and oriented, moves all 4 extremities.       Assessment &  Plan:

## 2013-12-02 NOTE — Assessment & Plan Note (Signed)
The patient appears to be doing fairly well from a pulmonary standpoint. His exertional tolerance is at baseline, and he is trying to stay active. He has not had recent bronchospasm or pulmonary infection. He has had no further hemoptysis despite being on Coumadin for his DVT. I have asked him to continue his current bronchodilator regimen, and to work more aggressively on weight loss and conditioning. I will see him back at his usual four-month appointment.

## 2013-12-02 NOTE — Patient Instructions (Signed)
Will send an order to [facility] so you can keep double battery Either take your cpap machine to [facility] for download or bring [place] the chip for download. Continue current breathing medications Work on staying active and weight loss.  followup with me again in 32mos.

## 2013-12-20 ENCOUNTER — Telehealth: Payer: Self-pay | Admitting: Pulmonary Disease

## 2013-12-20 DIAGNOSIS — J449 Chronic obstructive pulmonary disease, unspecified: Secondary | ICD-10-CM

## 2013-12-20 NOTE — Telephone Encounter (Signed)
LMTCB

## 2013-12-20 NOTE — Telephone Encounter (Signed)
Pt returning call.Maurice Delgado ° °

## 2013-12-20 NOTE — Telephone Encounter (Signed)
Spoke with the pt  He is requesting order for [employer] G3 POC  He states that he currently has a G2 and this weighs 10 lbs, but the G3 only weighs 7 lbs  I have sent order to [facility] for this Nothing further needed per pt

## 2014-01-12 ENCOUNTER — Ambulatory Visit (INDEPENDENT_AMBULATORY_CARE_PROVIDER_SITE_OTHER): Payer: Medicare Other | Admitting: Pharmacist

## 2014-01-12 DIAGNOSIS — I4891 Unspecified atrial fibrillation: Secondary | ICD-10-CM

## 2014-01-12 DIAGNOSIS — Z5181 Encounter for therapeutic drug level monitoring: Secondary | ICD-10-CM

## 2014-01-12 LAB — POCT INR: INR: 1.6

## 2014-01-13 ENCOUNTER — Telehealth: Payer: Self-pay | Admitting: Pulmonary Disease

## 2014-01-13 MED ORDER — TIOTROPIUM BROMIDE MONOHYDRATE 18 MCG IN CAPS: 18.0000 ug | ORAL_CAPSULE | Freq: Every morning | RESPIRATORY_TRACT | Status: DC

## 2014-01-13 NOTE — Telephone Encounter (Signed)
Spoke with pt. He needed RX sent for spiriva. Nothing further needed

## 2014-01-19 ENCOUNTER — Telehealth: Payer: Self-pay | Admitting: Pulmonary Disease

## 2014-01-19 ENCOUNTER — Other Ambulatory Visit: Payer: Self-pay | Admitting: *Deleted

## 2014-01-19 MED ORDER — WARFARIN SODIUM 5 MG PO TABS: ORAL_TABLET | ORAL | Status: DC

## 2014-01-19 MED ORDER — TIOTROPIUM BROMIDE MONOHYDRATE 18 MCG IN CAPS: 18.0000 ug | ORAL_CAPSULE | Freq: Every morning | RESPIRATORY_TRACT | Status: DC

## 2014-01-19 NOTE — Telephone Encounter (Signed)
Rx for spiriva sent to [facility] for 90 days supply  Pt aware  Sample up front for pick up  Nothing further needed per pt

## 2014-01-19 NOTE — Telephone Encounter (Signed)
Pt has called back.

## 2014-01-19 NOTE — Telephone Encounter (Signed)
Refill done as requested 

## 2014-01-20 ENCOUNTER — Other Ambulatory Visit: Payer: Self-pay

## 2014-01-25 ENCOUNTER — Other Ambulatory Visit: Payer: Self-pay

## 2014-01-25 MED ORDER — WARFARIN SODIUM 5 MG PO TABS: ORAL_TABLET | ORAL | Status: DC

## 2014-01-31 ENCOUNTER — Telehealth: Payer: Self-pay | Admitting: Pulmonary Disease

## 2014-01-31 DIAGNOSIS — G4733 Obstructive sleep apnea (adult) (pediatric): Secondary | ICD-10-CM

## 2014-01-31 MED ORDER — TIOTROPIUM BROMIDE MONOHYDRATE 18 MCG IN CAPS: 18.0000 ug | ORAL_CAPSULE | Freq: Every morning | RESPIRATORY_TRACT | Status: DC

## 2014-01-31 NOTE — Telephone Encounter (Signed)
Called spoke with pt. He wants to go to the sleep center for a mask fit. He was not happy with his old mask. Order placed. Nothing further needed

## 2014-01-31 NOTE — Telephone Encounter (Signed)
Called spoke with pt. 1) pt reports he had a piece broke off his CPAP mask. Needs new mask now sent to [place].  2) needs RX for [facility]. I have done so. Pt also needs a local RX sent to [facility] since he is now out. I have done this as well.  Before order sent to DME, he wants to know if [facility] wants him to go to the sleep center for a mask fit prior. He reports at times he does have problems with his mask. Please advise thanks

## 2014-01-31 NOTE — Telephone Encounter (Signed)
ATC pt home abd alternate # and line was hung up on.  wcb

## 2014-01-31 NOTE — Telephone Encounter (Signed)
We can send an order to dme if he just needs a replacement for a mask that fits well.  If he was not happy with his old mask, ok to send to [place] at sleep center for a fitting.

## 2014-02-07 ENCOUNTER — Ambulatory Visit (HOSPITAL_BASED_OUTPATIENT_CLINIC_OR_DEPARTMENT_OTHER): Payer: Medicare Other | Attending: Pulmonary Disease | Admitting: Radiology

## 2014-02-07 DIAGNOSIS — G4733 Obstructive sleep apnea (adult) (pediatric): Secondary | ICD-10-CM

## 2014-02-07 DIAGNOSIS — Z9989 Dependence on other enabling machines and devices: Principal | ICD-10-CM

## 2014-02-11 ENCOUNTER — Encounter: Payer: Self-pay | Admitting: *Deleted

## 2014-02-11 ENCOUNTER — Other Ambulatory Visit: Payer: Self-pay | Admitting: *Deleted

## 2014-02-11 MED ORDER — DIGOXIN 125 MCG PO TABS: 125.0000 ug | ORAL_TABLET | Freq: Every morning | ORAL | Status: DC

## 2014-02-23 ENCOUNTER — Other Ambulatory Visit: Payer: Self-pay

## 2014-02-23 MED ORDER — CARVEDILOL 3.125 MG PO TABS: 3.1250 mg | ORAL_TABLET | Freq: Two times a day (BID) | ORAL | Status: DC

## 2014-02-23 MED ORDER — SOTALOL HCL 160 MG PO TABS: 160.0000 mg | ORAL_TABLET | Freq: Every day | ORAL | Status: DC

## 2014-02-24 DIAGNOSIS — E119 Type 2 diabetes mellitus without complications: Secondary | ICD-10-CM | POA: Diagnosis not present

## 2014-02-24 DIAGNOSIS — I10 Essential (primary) hypertension: Secondary | ICD-10-CM | POA: Diagnosis not present

## 2014-03-01 ENCOUNTER — Ambulatory Visit (INDEPENDENT_AMBULATORY_CARE_PROVIDER_SITE_OTHER): Payer: Medicare Other | Admitting: *Deleted

## 2014-03-01 ENCOUNTER — Ambulatory Visit (INDEPENDENT_AMBULATORY_CARE_PROVIDER_SITE_OTHER): Payer: Medicare Other | Admitting: Pharmacist Clinician (PhC)/ Clinical Pharmacy Specialist

## 2014-03-01 ENCOUNTER — Encounter: Payer: Self-pay | Admitting: Internal Medicine

## 2014-03-01 ENCOUNTER — Ambulatory Visit (INDEPENDENT_AMBULATORY_CARE_PROVIDER_SITE_OTHER): Payer: Medicare Other | Admitting: Cardiology

## 2014-03-01 ENCOUNTER — Encounter: Payer: Self-pay | Admitting: Cardiology

## 2014-03-01 VITALS — BP 120/82 | HR 79 | Ht 72.0 in | Wt 272.0 lb

## 2014-03-01 DIAGNOSIS — I10 Essential (primary) hypertension: Secondary | ICD-10-CM | POA: Diagnosis not present

## 2014-03-01 DIAGNOSIS — Z95828 Presence of other vascular implants and grafts: Secondary | ICD-10-CM

## 2014-03-01 DIAGNOSIS — I509 Heart failure, unspecified: Secondary | ICD-10-CM | POA: Diagnosis not present

## 2014-03-01 DIAGNOSIS — I4891 Unspecified atrial fibrillation: Secondary | ICD-10-CM | POA: Diagnosis not present

## 2014-03-01 DIAGNOSIS — I519 Heart disease, unspecified: Secondary | ICD-10-CM

## 2014-03-01 DIAGNOSIS — I428 Other cardiomyopathies: Secondary | ICD-10-CM | POA: Diagnosis not present

## 2014-03-01 DIAGNOSIS — I499 Cardiac arrhythmia, unspecified: Secondary | ICD-10-CM

## 2014-03-01 DIAGNOSIS — Z9889 Other specified postprocedural states: Secondary | ICD-10-CM

## 2014-03-01 DIAGNOSIS — Z5181 Encounter for therapeutic drug level monitoring: Secondary | ICD-10-CM

## 2014-03-01 DIAGNOSIS — I480 Paroxysmal atrial fibrillation: Secondary | ICD-10-CM

## 2014-03-01 DIAGNOSIS — I5023 Acute on chronic systolic (congestive) heart failure: Secondary | ICD-10-CM

## 2014-03-01 LAB — BASIC METABOLIC PANEL
BUN: 20 mg/dL (ref 6–23)
CO2: 34 mEq/L — ABNORMAL HIGH (ref 19–32)
Calcium: 9.2 mg/dL (ref 8.4–10.5)
Chloride: 102 mEq/L (ref 96–112)
Creatinine, Ser: 1.5 mg/dL (ref 0.4–1.5)
GFR: 57.24 mL/min — AB (ref >60.00)
GLUCOSE: 96 mg/dL (ref 70–99)
Potassium: 3.4 mEq/L — ABNORMAL LOW (ref 3.5–5.1)
Sodium: 141 mEq/L (ref 135–145)

## 2014-03-01 LAB — MDC_IDC_ENUM_SESS_TYPE_INCLINIC
Brady Statistic AP VS Percent: 0.07 %
Brady Statistic AS VP Percent: 26.77 %
Brady Statistic AS VS Percent: 1.24 %
HIGH POWER IMPEDANCE MEASURED VALUE: 45 Ohm
HIGH POWER IMPEDANCE MEASURED VALUE: 62 Ohm
Lead Channel Impedance Value: 285 Ohm
Lead Channel Impedance Value: 342 Ohm
Lead Channel Impedance Value: 779 Ohm
Lead Channel Pacing Threshold Amplitude: 0.75 V
Lead Channel Pacing Threshold Amplitude: 1 V
Lead Channel Pacing Threshold Amplitude: 1.25 V
Lead Channel Pacing Threshold Pulse Width: 0.4 ms
Lead Channel Pacing Threshold Pulse Width: 0.5 ms
Lead Channel Sensing Intrinsic Amplitude: 16.125 mV
Lead Channel Sensing Intrinsic Amplitude: 2.125 mV
Lead Channel Sensing Intrinsic Amplitude: 2.125 mV
Lead Channel Setting Pacing Amplitude: 2 V
Lead Channel Setting Pacing Amplitude: 2 V
Lead Channel Setting Pacing Pulse Width: 0.4 ms
Lead Channel Setting Pacing Pulse Width: 0.5 ms
Lead Channel Setting Sensing Sensitivity: 0.3 mV
MDC IDC MSMT BATTERY VOLTAGE: 2.67 V
MDC IDC MSMT LEADCHNL LV IMPEDANCE VALUE: 627 Ohm
MDC IDC MSMT LEADCHNL RA IMPEDANCE VALUE: 437 Ohm
MDC IDC MSMT LEADCHNL RV PACING THRESHOLD PULSEWIDTH: 0.4 ms
MDC IDC MSMT LEADCHNL RV SENSING INTR AMPL: 16.125 mV
MDC IDC SESS DTM: 20150901101657
MDC IDC SET LEADCHNL RV PACING AMPLITUDE: 2.5 V
MDC IDC SET ZONE DETECTION INTERVAL: 400 ms
MDC IDC SET ZONE DETECTION INTERVAL: 450 ms
MDC IDC STAT BRADY AP VP PERCENT: 71.91 %
MDC IDC STAT BRADY RA PERCENT PACED: 71.99 %
MDC IDC STAT BRADY RV PERCENT PACED: 98.69 %
Zone Setting Detection Interval: 300 ms
Zone Setting Detection Interval: 350 ms

## 2014-03-01 LAB — POCT INR: INR: 2.2

## 2014-03-01 NOTE — Patient Instructions (Addendum)
Your physician recommends that you return for lab work today:BMP,Digoxin  Your physician recommends that you continue on your current medications as directed. Please refer to the Current Medication list given to you today.   Your physician recommends that you schedule a follow-up appointment in: 3 months with Dr. Rayann Heman  Your physician wants you to follow-up in: 6 months with DR. Gaspar Bidding will receive a reminder letter in the mail two months in advance. If you don't receive a letter, please call our office to schedule the follow-up appointment.  Coumadin checked today

## 2014-03-01 NOTE — Assessment & Plan Note (Signed)
Hx of RF cath ablation of atria tach. And AV node ablation

## 2014-03-01 NOTE — Assessment & Plan Note (Signed)
stable °

## 2014-03-01 NOTE — Assessment & Plan Note (Signed)
Stable, does have increased edema of his legs but does not wish to increase the dose even for a few days.  He is on 120 mg Lasix BID per nephrology.  Wt is decreased.

## 2014-03-01 NOTE — Assessment & Plan Note (Signed)
Stable with BiV ICD

## 2014-03-01 NOTE — Assessment & Plan Note (Addendum)
See above note- pt with lower ext edema only no SOB

## 2014-03-01 NOTE — Progress Notes (Signed)
CRT-D device check in office. Thresholds and sensing consistent with previous device measurements. Lead impedance trends stable over time. 13 mode switch episodes recorded, 15.5%, + coumadin. 8 NSVT episodes. Patient bi-ventricularly pacing >98.7 % of the time. Device programmed with appropriate safety margins. Heart failure diagnostics reviewed.  Optivol and thoracic impedance abnormal 8/28 ongoing.   Audible/vibratory alerts demonstrated for patient. Base rate to 70bpm.   Patient enrolled in remote follow up. Plan to check device remotely in 3 months and see in office in 6 months. Patient education completed including shock plan.  ROV 3 months with Dr. Rayann Heman.

## 2014-03-01 NOTE — Progress Notes (Signed)
03/01/2014   PCP: Barbette Merino, MD   Chief Complaint  Patient presents with  . Appointment    6 month f/u    Primary Cardiologist: Dr. Tamala Julian EP:  Dr. Rayann Heman  HPI:  80 y.o. male has complicated medical issues. They include chronic systolic heart failure with NICM, paroxysmal atrial fibrillation, incessant atrial tachycardia (status improved post ablation and AV node ablation) renal insufficiency, chronic right heart failure with lower extremity edema/venous insufficiency/IVC umbrella). Recent furosemide adjustment to 120 mg twice a day by nephrology. He is on chronic O2. He has sleep apnea on CPAP at night. He denies orthopnea- able to sleep flat. No chest pain. No recurrent syncope . The patient does have a BiV ICD.  Device has not been evaluated in some time.  Today pt is here for followup he last saw Dr. Tamala Julian in January 2015.  He has no complaints- occasionally coughs up phlegm but no awareness if any change in his respiratory status. His lower extremities with edema some of this is chronic may be slightly increased. He has not taken his medications yet this morning.  Denies chest pain.  Last echo 2014 EF was 20-25% with severe hypokinesis. He had septal motion paradox.  Mild aortic regurgitation mild mitral regurg. Pulmonary artery PA pressure was 42 mmHg.  Last INR check was July 15 this year.   Allergies  Allergen Reactions  . Amiodarone     hyperthyroidism    Current Outpatient Prescriptions  Medication Sig Dispense Refill  . Ascorbic Acid (VITAMIN C) 1000 MG tablet Take 1,000 mg by mouth every morning.       . budesonide-formoterol (SYMBICORT) 160-4.5 MCG/ACT inhaler Inhale 2 puffs into the lungs 2 (two) times daily.  3 Inhaler  1  . calcitRIOL (ROCALTROL) 0.5 MCG capsule Take 0.5 mcg by mouth every other day.       . carvedilol (COREG) 3.125 MG tablet Take 1 tablet (3.125 mg total) by mouth 2 (two) times daily with a meal.  90 tablet  1  . digoxin (LANOXIN)  0.125 MG tablet Take 1 tablet (125 mcg total) by mouth every morning.  90 tablet  0  . Febuxostat (ULORIC) 80 MG TABS Take 80 mg by mouth every morning.       . furosemide (LASIX) 40 MG tablet Take 3 tablets (120 mg total) by mouth 2 (two) times daily.      Marland Kitchen levalbuterol (XOPENEX HFA) 45 MCG/ACT inhaler Inhale 2 puffs into the lungs every 6 (six) hours as needed for wheezing.      . Olopatadine HCl (PATADAY) 0.2 % SOLN Place 1 drop into both eyes every morning.      . pregabalin (LYRICA) 150 MG capsule Take 150 mg by mouth 2 (two) times daily.        . sotalol (BETAPACE) 160 MG tablet Take 1 tablet (160 mg total) by mouth daily.  90 tablet  1  . Tamsulosin HCl (FLOMAX) 0.4 MG CAPS Take 0.4 mg by mouth every morning.       . tiotropium (SPIRIVA) 18 MCG inhalation capsule Place 1 capsule (18 mcg total) into inhaler and inhale every morning.  30 capsule  0  . warfarin (COUMADIN) 5 MG tablet Take as directed by coumadin clinic  30 tablet  0   No current facility-administered medications for this visit.    Past Medical History  Diagnosis Date  . Nonischemic cardiomyopathy     a. EF 35%.  b. s/p CRT-D device implanted - initially 2002 in [place], gen change 2011 (MDT).  . HTN (hypertension)   . DVT (deep venous thrombosis)     a. Recurrent DVTs. b. s/p IVC filter 2007. c. DVTs dx 05/2013 - started back on Coumadin  . Gout   . COPD (chronic obstructive pulmonary disease)   . Chronic systolic CHF (congestive heart failure)   . PAF (paroxysmal atrial fibrillation)   . GERD (gastroesophageal reflux disease)   . Left fibular fracture     a. 03/2013 - nonsurgical mgmt.  . Hemoptysis     a. H/o of hemoptysis with pulmonary fibrosis (patient has refused biopsies in the past).  . OSA on CPAP   . Chronic kidney disease (CKD), stage III (moderate)   . Chronic respiratory failure     a. On home O2 - 2-3L 24/7  . Retroperitoneal bleed     a. Remotely on Coumadin (per Dr. Jackalyn Lombard prior office note).  .  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  . Atrial tachycardia     a. s/p ablation 05/2013.    Past Surgical History  Procedure Laterality Date  . Cardiac defibrillator placement      initial BiV ICD 09/22/00 in [place].  Gen change (MDT) by Dr Leonia Reeves 10/30/09  . Video bronchoscopy  02/26/2012    Procedure: VIDEO BRONCHOSCOPY WITHOUT FLUORO;  Surgeon: Kathee Delton, MD;  Location: [facility];  Service: Cardiopulmonary;  Laterality: Bilateral;  . Foot surgery Right     "removed a knot off top of my foot" (06/21/2013)  . Tonsillectomy  1940's  . Appendectomy  1940's    "I think so"  . Vena cava filter placement  2007    [name] 05/25/2013 (06/21/2013)    TZG:YFVCBSW:HQ colds or fevers, no weight changes- recently but improved from feb. Skin:no rashes or ulcers HEENT:no blurred vision, occ congestion CV:see HPI PUL:see HPI GI:no diarrhea constipation or melena, no indigestion GU:no hematuria, no dysuria MS:+ bil knee joint pain, no claudication Neuro:no syncope, no lightheadedness Endo:no diabetes, no thyroid disease  Wt Readings from Last 3 Encounters:  03/01/14 272 lb (123.378 kg)  12/02/13 275 lb (124.739 kg)  08/04/13 284 lb 12.8 oz (129.184 kg)    PHYSICAL EXAM BP 120/82  Pulse 79  Ht 6' (1.829 m)  Wt 272 lb (123.378 kg)  BMI 36.88 kg/m2 General:Pleasant affect, NAD Skin:Warm and dry, brisk capillary refill HEENT:normocephalic, sclera clear, mucus membranes moist Neck:supple, no JVD, no bruits  Heart:S1S2 RRR without murmur, gallup, rub or click Lungs:diminished without rales, rhonchi, or wheezes PRF:FMBWG, soft, non tender, + BS, do not palpate liver spleen or masses Ext:+ lower ext edema 1-2 +,  2+ radial pulses Neuro:alert and oriented X 3, MAE, follows commands, + facial symmetry EKG: AV pacing  Pacer was interrogated today, see device clinic note.  ASSESSMENT AND PLAN Acute on chronic systolic CHF (congestive heart failure), NYHA class 3 Stable, does have  increased edema of his legs but does not wish to increase the dose even for a few days.  He is on 120 mg Lasix BID per nephrology.  Wt is decreased.  Chronic systolic dysfunction of left ventricle See above note- pt with lower ext edema only no SOB  NICM (nonischemic cardiomyopathy) Stable with BiV ICD  S/P IVC filter stable  PAF (paroxysmal atrial fibrillation) Hx of RF cath ablation of atria tach. And AV node ablation  check BMP and dig level today, follow up in 6  months with Dr. Tamala Julian and 3 months with Dr. Rayann Heman.  Will have him seen in coumadin clinic today.

## 2014-03-02 LAB — DIGOXIN LEVEL: Digoxin Level: 0.8 ng/mL (ref 0.8–2.0)

## 2014-03-04 ENCOUNTER — Other Ambulatory Visit: Payer: Self-pay | Admitting: Cardiology

## 2014-03-04 DIAGNOSIS — I480 Paroxysmal atrial fibrillation: Secondary | ICD-10-CM

## 2014-03-04 MED ORDER — POTASSIUM CHLORIDE ER 20 MEQ PO TBCR
EXTENDED_RELEASE_TABLET | ORAL | Status: DC
Start: 2014-03-04 — End: 2014-04-07

## 2014-03-22 DIAGNOSIS — Z23 Encounter for immunization: Secondary | ICD-10-CM | POA: Diagnosis not present

## 2014-03-22 DIAGNOSIS — N183 Chronic kidney disease, stage 3 unspecified: Secondary | ICD-10-CM | POA: Diagnosis not present

## 2014-03-22 DIAGNOSIS — N039 Chronic nephritic syndrome with unspecified morphologic changes: Secondary | ICD-10-CM | POA: Diagnosis not present

## 2014-03-22 DIAGNOSIS — N2581 Secondary hyperparathyroidism of renal origin: Secondary | ICD-10-CM | POA: Diagnosis not present

## 2014-03-22 DIAGNOSIS — M109 Gout, unspecified: Secondary | ICD-10-CM | POA: Diagnosis not present

## 2014-03-22 DIAGNOSIS — D631 Anemia in chronic kidney disease: Secondary | ICD-10-CM | POA: Diagnosis not present

## 2014-04-07 ENCOUNTER — Ambulatory Visit (INDEPENDENT_AMBULATORY_CARE_PROVIDER_SITE_OTHER): Payer: Medicare Other | Admitting: Pulmonary Disease

## 2014-04-07 ENCOUNTER — Encounter: Payer: Self-pay | Admitting: Pulmonary Disease

## 2014-04-07 VITALS — BP 122/66 | HR 73 | Temp 97.8°F | Ht 74.0 in | Wt 276.8 lb

## 2014-04-07 DIAGNOSIS — G4733 Obstructive sleep apnea (adult) (pediatric): Secondary | ICD-10-CM | POA: Diagnosis not present

## 2014-04-07 DIAGNOSIS — J449 Chronic obstructive pulmonary disease, unspecified: Secondary | ICD-10-CM

## 2014-04-07 NOTE — Progress Notes (Signed)
   Subjective:    Patient ID: Maurice Delgado, male    DOB: 1933-08-29, 80 y.o.   MRN: [id number]  HPI The patient comes in today for followup of his known COPD and obstructive sleep apnea. He also has an underlying cardiomyopathy with atrial arrhythmia, as well as a history of DVT with IVC filter in place and on chronic anticoagulation.  He is maintaining on his bronchodilator regimen, as well as oxygen. He feels that his breathing is near his usual baseline, and has had very little cough and no hemoptysis.  He is wearing CPAP compliantly, and is working with a mask to try and eliminate leaks. He should also be noted that he has chronic renal insufficiency   Review of Systems  Constitutional: Negative for fever and unexpected weight change.  HENT: Positive for congestion. Negative for dental problem, ear pain, nosebleeds, postnasal drip, rhinorrhea, sinus pressure, sneezing, sore throat and trouble swallowing.   Eyes: Negative for redness and itching.  Respiratory: Positive for shortness of breath. Negative for cough, chest tightness and wheezing.   Cardiovascular: Negative for palpitations and leg swelling.  Gastrointestinal: Negative for nausea and vomiting.  Genitourinary: Negative for dysuria.  Musculoskeletal: Negative for joint swelling.  Skin: Negative for rash.  Neurological: Negative for headaches.  Hematological: Does not bruise/bleed easily.  Psychiatric/Behavioral: Negative for dysphoric mood. The patient is not nervous/anxious.        Objective:   Physical Exam Overweight male in no acute distress Nose without purulence or discharge noted No skin breakdown or pressure necrosis from the CPAP mask Neck without lymphadenopathy or thyromegaly Chest with mildly decreased breath sounds, a few basilar crackles, no active wheezing Cardiac exam with regular rate and rhythm Lower extremities with 3+ edema, no cyanosis Alert and oriented, moves all 4 extremities.         Assessment & Plan:

## 2014-04-07 NOTE — Assessment & Plan Note (Signed)
The patient tells me that he is wearing CPAP compliantly, and although he does have a few mask leaks, he is still doing well.

## 2014-04-07 NOTE — Patient Instructions (Signed)
No change in breathing medications Stay on oxygen followup with me again in 4-15mos.

## 2014-04-07 NOTE — Assessment & Plan Note (Signed)
The patient appears to be stable from a COPD standpoint. His chronic dyspnea on exertion is multifactorial, and related to his COPD, cardiomyopathy, obesity, and deconditioning. I have asked him to continue on his bronchodilator regimen, staying on his oxygen, and to try working on weight loss and conditioning.  He has already had his flu shot this year.

## 2014-05-10 ENCOUNTER — Other Ambulatory Visit: Payer: Self-pay | Admitting: Interventional Cardiology

## 2014-05-13 ENCOUNTER — Other Ambulatory Visit: Payer: Self-pay | Admitting: *Deleted

## 2014-05-16 ENCOUNTER — Ambulatory Visit (INDEPENDENT_AMBULATORY_CARE_PROVIDER_SITE_OTHER): Payer: Medicare Other | Admitting: *Deleted

## 2014-05-16 ENCOUNTER — Other Ambulatory Visit: Payer: Self-pay | Admitting: *Deleted

## 2014-05-16 DIAGNOSIS — I824Y9 Acute embolism and thrombosis of unspecified deep veins of unspecified proximal lower extremity: Secondary | ICD-10-CM

## 2014-05-16 DIAGNOSIS — Z5181 Encounter for therapeutic drug level monitoring: Secondary | ICD-10-CM | POA: Diagnosis not present

## 2014-05-16 DIAGNOSIS — I4891 Unspecified atrial fibrillation: Secondary | ICD-10-CM | POA: Diagnosis not present

## 2014-05-16 LAB — POCT INR: INR: 4

## 2014-05-16 MED ORDER — WARFARIN SODIUM 5 MG PO TABS: ORAL_TABLET | ORAL | Status: DC

## 2014-05-18 ENCOUNTER — Other Ambulatory Visit: Payer: Self-pay | Admitting: *Deleted

## 2014-05-18 MED ORDER — WARFARIN SODIUM 5 MG PO TABS: ORAL_TABLET | ORAL | Status: DC

## 2014-05-30 ENCOUNTER — Ambulatory Visit (INDEPENDENT_AMBULATORY_CARE_PROVIDER_SITE_OTHER): Payer: Medicare Other | Admitting: *Deleted

## 2014-05-30 DIAGNOSIS — Z5181 Encounter for therapeutic drug level monitoring: Secondary | ICD-10-CM

## 2014-05-30 DIAGNOSIS — I4891 Unspecified atrial fibrillation: Secondary | ICD-10-CM | POA: Diagnosis not present

## 2014-05-30 LAB — POCT INR: INR: 2.7

## 2014-06-06 ENCOUNTER — Encounter: Payer: Medicare Other | Admitting: Internal Medicine

## 2014-06-09 ENCOUNTER — Encounter (HOSPITAL_COMMUNITY): Payer: Self-pay | Admitting: Internal Medicine

## 2014-06-21 ENCOUNTER — Encounter: Payer: Self-pay | Admitting: Internal Medicine

## 2014-07-06 ENCOUNTER — Encounter: Payer: Self-pay | Admitting: Internal Medicine

## 2014-07-06 DIAGNOSIS — J449 Chronic obstructive pulmonary disease, unspecified: Secondary | ICD-10-CM | POA: Diagnosis not present

## 2014-07-06 DIAGNOSIS — E119 Type 2 diabetes mellitus without complications: Secondary | ICD-10-CM | POA: Diagnosis not present

## 2014-07-06 DIAGNOSIS — I10 Essential (primary) hypertension: Secondary | ICD-10-CM | POA: Diagnosis not present

## 2014-07-06 DIAGNOSIS — N184 Chronic kidney disease, stage 4 (severe): Secondary | ICD-10-CM | POA: Diagnosis not present

## 2014-07-07 ENCOUNTER — Telehealth: Payer: Self-pay | Admitting: Pulmonary Disease

## 2014-07-07 MED ORDER — TIOTROPIUM BROMIDE MONOHYDRATE 18 MCG IN CAPS: 18.0000 ug | ORAL_CAPSULE | Freq: Every morning | RESPIRATORY_TRACT | Status: DC

## 2014-07-07 NOTE — Telephone Encounter (Signed)
ATC pt line busy x 3 wcb RX sent in for pt

## 2014-07-08 NOTE — Telephone Encounter (Signed)
Pt is aware that rx has been sent in. Nothing further was needed. 

## 2014-07-13 ENCOUNTER — Encounter: Payer: Medicare Other | Admitting: Internal Medicine

## 2014-07-14 ENCOUNTER — Encounter: Payer: Self-pay | Admitting: Internal Medicine

## 2014-07-14 ENCOUNTER — Ambulatory Visit (INDEPENDENT_AMBULATORY_CARE_PROVIDER_SITE_OTHER): Payer: Medicare Other | Admitting: Internal Medicine

## 2014-07-14 VITALS — BP 110/70 | HR 86 | Ht 74.0 in | Wt 269.8 lb

## 2014-07-14 DIAGNOSIS — I471 Supraventricular tachycardia: Secondary | ICD-10-CM

## 2014-07-14 DIAGNOSIS — I480 Paroxysmal atrial fibrillation: Secondary | ICD-10-CM

## 2014-07-14 DIAGNOSIS — N189 Chronic kidney disease, unspecified: Secondary | ICD-10-CM | POA: Diagnosis not present

## 2014-07-14 DIAGNOSIS — I5023 Acute on chronic systolic (congestive) heart failure: Secondary | ICD-10-CM

## 2014-07-14 DIAGNOSIS — I428 Other cardiomyopathies: Secondary | ICD-10-CM

## 2014-07-14 DIAGNOSIS — I519 Heart disease, unspecified: Secondary | ICD-10-CM

## 2014-07-14 DIAGNOSIS — I129 Hypertensive chronic kidney disease with stage 1 through stage 4 chronic kidney disease, or unspecified chronic kidney disease: Secondary | ICD-10-CM

## 2014-07-14 DIAGNOSIS — I442 Atrioventricular block, complete: Secondary | ICD-10-CM

## 2014-07-14 LAB — MDC_IDC_ENUM_SESS_TYPE_INCLINIC
Brady Statistic AP VP Percent: 82.79 %
Brady Statistic AP VS Percent: 0.06 %
Brady Statistic AS VP Percent: 16.63 %
Brady Statistic AS VP Percent: 16.63 %
Brady Statistic AS VS Percent: 0.52 %
Brady Statistic RA Percent Paced: 82.85 %
Brady Statistic RA Percent Paced: 82.85 %
Brady Statistic RV Percent Paced: 99.42 %
HighPow Impedance: 48 Ohm
HighPow Impedance: 65 Ohm
HighPow Impedance: 48 Ohm
HighPow Impedance: 65 Ohm
Lead Channel Impedance Value: 285 Ohm
Lead Channel Impedance Value: 342 Ohm
Lead Channel Impedance Value: 475 Ohm
Lead Channel Impedance Value: 285 Ohm
Lead Channel Impedance Value: 475 Ohm
Lead Channel Impedance Value: 589 Ohm
Lead Channel Impedance Value: 779 Ohm
Lead Channel Pacing Threshold Amplitude: 0.875 V
Lead Channel Pacing Threshold Amplitude: 1.25 V
Lead Channel Pacing Threshold Amplitude: 0.875 V
Lead Channel Pacing Threshold Amplitude: 1 V
Lead Channel Pacing Threshold Pulse Width: 0.4 ms
Lead Channel Pacing Threshold Pulse Width: 0.4 ms
Lead Channel Pacing Threshold Pulse Width: 0.5 ms
Lead Channel Pacing Threshold Pulse Width: 0.4 ms
Lead Channel Pacing Threshold Pulse Width: 0.5 ms
Lead Channel Sensing Intrinsic Amplitude: 9.625 mV
Lead Channel Sensing Intrinsic Amplitude: 2.25 mV
Lead Channel Sensing Intrinsic Amplitude: 9.625 mV
Lead Channel Setting Pacing Amplitude: 2 V
Lead Channel Setting Pacing Amplitude: 2 V
Lead Channel Setting Pacing Amplitude: 2.5 V
Lead Channel Setting Pacing Amplitude: 2 V
Lead Channel Setting Pacing Amplitude: 2 V
Lead Channel Setting Pacing Pulse Width: 0.5 ms
Lead Channel Setting Pacing Pulse Width: 0.4 ms
MDC IDC MSMT BATTERY VOLTAGE: 2.62 V
MDC IDC MSMT BATTERY VOLTAGE: 2.62 V
MDC IDC MSMT LEADCHNL LV IMPEDANCE VALUE: 589 Ohm
MDC IDC MSMT LEADCHNL LV IMPEDANCE VALUE: 779 Ohm
MDC IDC MSMT LEADCHNL LV IMPEDANCE VALUE: 342 Ohm
MDC IDC MSMT LEADCHNL LV PACING THRESHOLD AMPLITUDE: 1 V
MDC IDC MSMT LEADCHNL RA SENSING INTR AMPL: 2 mV
MDC IDC MSMT LEADCHNL RA SENSING INTR AMPL: 2.25 mV
MDC IDC MSMT LEADCHNL RA SENSING INTR AMPL: 2 mV
MDC IDC MSMT LEADCHNL RV PACING THRESHOLD AMPLITUDE: 1.25 V
MDC IDC MSMT LEADCHNL RV PACING THRESHOLD PULSEWIDTH: 0.4 ms
MDC IDC MSMT LEADCHNL RV SENSING INTR AMPL: 9.625 mV
MDC IDC MSMT LEADCHNL RV SENSING INTR AMPL: 9.625 mV
MDC IDC SESS DTM: 20160114122640
MDC IDC SESS DTM: 20160114123035
MDC IDC SET LEADCHNL LV PACING PULSEWIDTH: 0.5 ms
MDC IDC SET LEADCHNL RV PACING AMPLITUDE: 2.5 V
MDC IDC SET LEADCHNL RV PACING PULSEWIDTH: 0.4 ms
MDC IDC SET LEADCHNL RV SENSING SENSITIVITY: 0.3 mV
MDC IDC SET LEADCHNL RV SENSING SENSITIVITY: 0.3 mV
MDC IDC SET ZONE DETECTION INTERVAL: 400 ms
MDC IDC STAT BRADY AP VP PERCENT: 82.79 %
MDC IDC STAT BRADY AP VS PERCENT: 0.06 %
MDC IDC STAT BRADY AS VS PERCENT: 0.52 %
MDC IDC STAT BRADY RV PERCENT PACED: 99.42 %
Zone Setting Detection Interval: 300 ms
Zone Setting Detection Interval: 350 ms
Zone Setting Detection Interval: 400 ms
Zone Setting Detection Interval: 450 ms
Zone Setting Detection Interval: 300 ms
Zone Setting Detection Interval: 350 ms
Zone Setting Detection Interval: 450 ms

## 2014-07-14 NOTE — Patient Instructions (Signed)
Your physician recommends that you schedule a follow-up appointment in: device clinic in one month  Your physician has requested that you have an echocardiogram. Echocardiography is a painless test that uses sound waves to create images of your heart. It provides your doctor with information about the size and shape of your heart and how well your heart's chambers and valves are working. This procedure takes approximately one hour. There are no restrictions for this procedure.       Your physician wants you to follow-up in: 12 months with Dr Vallery Ridge will receive a reminder letter in the mail two months in advance. If you don't receive a letter, please call our office to schedule the follow-up appointment.

## 2014-07-14 NOTE — Progress Notes (Signed)
Electrophysiology Office Note   Date:  07/14/2014   ID:  [facility], DOB 02/13/1934, MRN [id number]  PCP:  Maurice Merino, MD  Cardiologist:  Dr Maurice Julian Primary Electrophysiologist: [facility]   Chief Complaint  Patient presents with  . Shortness of Breath     History of Present Illness: [facility] Delgado is a 81 y.o. male who presents today for electrophysiology evaluation.   The patient has stable SOB.  He denies CP.  His ICD is approaching ERI.  He has chronic renal failure and edema which is stable.  He is unaware of AF/ atach. Today, he denies symptoms of palpitations, chest pain,   claudication, dizziness, presyncope, syncope, bleeding, or neurologic sequela. The patient is tolerating medications without difficulties and is otherwise without complaint today.   Past Medical History  Diagnosis Date  . Nonischemic cardiomyopathy     a. EF 35%. b. s/p CRT-D device implanted - initially 2002 in [place], gen change 2011 (MDT).  . HTN (hypertension)   . DVT (deep venous thrombosis)     a. Recurrent DVTs. b. s/p IVC filter 2007. c. DVTs dx 05/2013 - started back on Coumadin  . Gout   . COPD (chronic obstructive pulmonary disease)   . Chronic systolic CHF (congestive heart failure)   . PAF (paroxysmal atrial fibrillation)   . GERD (gastroesophageal reflux disease)   . Left fibular fracture     a. 03/2013 - nonsurgical mgmt.  . Hemoptysis     a. H/o of hemoptysis with pulmonary fibrosis (patient has refused biopsies in the past).  . OSA on CPAP   . Chronic kidney disease (CKD), stage III (moderate)   . Chronic respiratory failure     a. On home O2 - 2-3L 24/7  . Retroperitoneal bleed     a. Remotely on Coumadin (per Dr. Jackalyn Delgado prior office note).  . 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  . Atrial tachycardia     a. s/p ablation 05/2013.   Past Surgical History  Procedure Laterality Date  . Cardiac defibrillator placement      initial BiV ICD 09/22/00 in [place].  Gen change  (MDT) by Dr [facility] 10/30/09  . Video bronchoscopy  02/26/2012    Procedure: VIDEO BRONCHOSCOPY WITHOUT FLUORO;  Surgeon: Maurice Delton, MD;  [facility];  Service: Cardiopulmonary;  Laterality: Bilateral;  . Foot surgery Right     "removed a knot off top of my foot" (06/21/2013)  . Tonsillectomy  1940's  . Appendectomy  1940's    "I think so"  . Vena cava filter placement  2007    [facility] 05/25/2013 (06/21/2013)  . Supraventricular tachycardia ablation N/A 06/25/2013    Procedure: SUPRAVENTRICULAR TACHYCARDIA ABLATION;  Surgeon: Maurice Lance, MD;  Location: [facility] CATH LAB;  Service: Cardiovascular;  Laterality: N/A;     Current Outpatient Prescriptions  Medication Sig Dispense Refill  . Ascorbic Acid (VITAMIN C) 1000 MG tablet Take 1,000 mg by mouth every morning.     . budesonide-formoterol (SYMBICORT) 160-4.5 MCG/ACT inhaler Inhale 2 puffs into the lungs 2 (two) times daily. 3 Inhaler 1  . calcitRIOL (ROCALTROL) 0.5 MCG capsule Take 0.5 mcg by mouth every other day.     Marland Kitchen DIGITEK 125 MCG tablet TAKE 1 TABLET EVERY MORNING 90 tablet 0  . Febuxostat (ULORIC) 80 MG TABS Take 80 mg by mouth every morning.     . furosemide (LASIX) 40 MG tablet Take 3 tablets (120 mg total) by mouth 2 (two)  times daily.    Marland Kitchen levalbuterol (XOPENEX HFA) 45 MCG/ACT inhaler Inhale 2 puffs into the lungs every 6 (six) hours as needed for wheezing.    . Olopatadine HCl (PATADAY) 0.2 % SOLN Place 1 drop into both eyes every morning.    . pregabalin (LYRICA) 150 MG capsule Take 150 mg by mouth 2 (two) times daily.      . sotalol (BETAPACE) 160 MG tablet Take 1 tablet (160 mg total) by mouth daily. 90 tablet 1  . Tamsulosin HCl (FLOMAX) 0.4 MG CAPS Take 0.4 mg by mouth every morning.     . tiotropium (SPIRIVA) 18 MCG inhalation capsule Place 1 capsule (18 mcg total) into inhaler and inhale every morning. 90 capsule 3  . warfarin (COUMADIN) 5 MG tablet Take as directed by coumadin clinic 150 tablet 0   No  current facility-administered medications for this visit.    Allergies:   Amiodarone   Social History:  The patient  reports that he has quit smoking. His smoking use included Cigarettes. He has a 45 pack-year smoking history. He has never used smokeless tobacco. He reports that he uses illicit drugs (Marijuana). He reports that he does not drink alcohol.   Family History:  The patient's family history includes Heart disease in his mother.    ROS:  Please see the history of present illness.    All other systems are reviewed and negative.    PHYSICAL EXAM: VS:  BP 110/70 mmHg  Pulse 86  Ht 6\' 2"  (1.88 m)  Wt 269 lb 12.8 oz (122.38 kg)  BMI 34.63 kg/m2  SpO2 89% , BMI Body mass index is 34.63 kg/(m^2). GEN: Well nourished, well developed, in no acute distress HEENT: normal Neck: no JVD, carotid bruits, or masses Cardiac: RRR  Respiratory:  Decreased BS, prolonged expiratory phase, normal work of breathing GI: soft, nontender, nondistended, + BS MS: no deformity or atrophy Skin: warm and dry,  device pocket is well healed Neuro:  Strength and sensation are intact Psych: euthymic mood, full affect  EKG:  EKG is ordered today. The ekg ordered today shows sinus with BiV pacing   Device interrogation is reviewed today in detail.  See PaceArt for details.  Recent Labs: 03/01/2014: BUN 20; Creatinine 1.5; Potassium 3.4*; Sodium 141  No results found for requested labs within last 365 days.     CrCl cannot be calculated (Patient has no serum creatinine result on file.).   Wt Readings from Last 3 Encounters:  07/14/14 269 lb 12.8 oz (122.38 kg)  04/07/14 276 lb 12.8 oz (125.556 kg)  03/01/14 272 lb (123.378 kg)     Other studies Reviewed:  Additional studies/ records reviewed today include: echo from 2014 and this reveals EF 25%, notes from [facility] and prior discharge summary with EP study by Dr [facility] are reviewed  ASSESSMENT AND PLAN:  1.  Nonischemic CM Chronic  SOB Will update echo Medical therapy is limited by renal failure.  Not on a beta blocker due to lung disease. Has not seen Dr [facility] Julian in quite some time. Salt restriction is advised  2. Atach/afib Appropriately anticoagulated Continue sotalol as he did have afib in June and also in October  3. Complete heart block Normal BiV ICD function He is approaching ERI. Risks, benefits, and alternatives to BiV ICD gen change were discussed with the patient.  He is clear that he would like to have an ICD rather than pacemaker at time of gen change.  He  will return in 4 weeks for device check.  Alert tones were demonstrated to the patient today.  Once he has reached ERI, we will proceed with gen change.  Additional office visit should not be required if this happens in the next 60 days.  4. CRI Stable No change required today   Current medicines are reviewed at length with the patient today.  The patient is without any concerns regarding medicines and no changes are made today.    Disposition:   FU with Erasmo Downer in the device clinic in 4 weeks   Army Fossa MD  07/14/2014 12:29 PM      [place] [address] [place]  [place] [address]  [phone] (office) [phone] (fax)

## 2014-07-26 ENCOUNTER — Telehealth: Payer: Self-pay | Admitting: *Deleted

## 2014-07-26 NOTE — Telephone Encounter (Signed)
Pt returned call and would like alert time changed from 1200 to 1500. Instructed pt would make changes at next device check. Pt ok will this. Pt scheduled for 08-15-14 @ 1000 for device clinic for battery check.

## 2014-07-26 NOTE — Telephone Encounter (Signed)
Left message with wife returning pt's call. Pt just went to sleep and will return call in little bit. Pt calling with questions about device.

## 2014-07-28 ENCOUNTER — Ambulatory Visit (INDEPENDENT_AMBULATORY_CARE_PROVIDER_SITE_OTHER): Payer: Medicare Other | Admitting: Pharmacist

## 2014-07-28 DIAGNOSIS — I480 Paroxysmal atrial fibrillation: Secondary | ICD-10-CM

## 2014-07-28 DIAGNOSIS — Z5181 Encounter for therapeutic drug level monitoring: Secondary | ICD-10-CM | POA: Diagnosis not present

## 2014-07-28 LAB — POCT INR: INR: 2.1

## 2014-08-04 ENCOUNTER — Telehealth: Payer: Self-pay | Admitting: Pulmonary Disease

## 2014-08-04 DIAGNOSIS — G4733 Obstructive sleep apnea (adult) (pediatric): Secondary | ICD-10-CM

## 2014-08-04 NOTE — Telephone Encounter (Signed)
Called spoke with pt. He is wanting to go back over to the sleep center for another mask fit. He is having problems w/ mask and straps. Please advise [place] thanks

## 2014-08-05 NOTE — Telephone Encounter (Signed)
[place] with me.  [id number]

## 2014-08-05 NOTE — Telephone Encounter (Signed)
ATC home# given, NA LM on cell/alternative number x 1

## 2014-08-08 ENCOUNTER — Encounter: Payer: Self-pay | Admitting: Pulmonary Disease

## 2014-08-08 ENCOUNTER — Ambulatory Visit (INDEPENDENT_AMBULATORY_CARE_PROVIDER_SITE_OTHER): Payer: Medicare Other | Admitting: Pulmonary Disease

## 2014-08-08 VITALS — BP 128/72 | HR 80 | Ht 74.0 in | Wt 273.0 lb

## 2014-08-08 DIAGNOSIS — G4733 Obstructive sleep apnea (adult) (pediatric): Secondary | ICD-10-CM | POA: Diagnosis not present

## 2014-08-08 DIAGNOSIS — J438 Other emphysema: Secondary | ICD-10-CM | POA: Diagnosis not present

## 2014-08-08 NOTE — Assessment & Plan Note (Signed)
The patient has been doing well on his current bronchodilator regimen, and I have asked him to continue on these. Had an acute exacerbation or infection since the last visit.

## 2014-08-08 NOTE — Telephone Encounter (Signed)
lmtcb on cell # x2

## 2014-08-08 NOTE — Progress Notes (Signed)
   Subjective:    Patient ID: [facility], male    DOB: 05-08-1934, 81 y.o.   MRN: [id number]  HPI Patient comes in today for follow-up of his known COPD with chronic respiratory failure as well as obstructive sleep apnea. Has a known cardiomyopathy with chronic congestive heart failure and significant chronic renal insufficiency. He is also on chronic anticoagulation for thromboembolic disease. He has done well since his last visit, with no acute exacerbation or pulmonary infection. He continues on his bronchodilator regimen, and feels that his breathing is at baseline.  He is wearing C Pap compliantly, but is having issues with his mask which he thinks may be falling apart.   Review of Systems  Constitutional: Negative for fever and unexpected weight change.  HENT: Negative for congestion, dental problem, ear pain, nosebleeds, postnasal drip, rhinorrhea, sinus pressure, sneezing, sore throat and trouble swallowing.   Eyes: Negative for redness and itching.  Respiratory: Positive for shortness of breath. Negative for cough, chest tightness and wheezing.   Cardiovascular: Negative for palpitations and leg swelling.  Gastrointestinal: Negative for nausea and vomiting.  Genitourinary: Negative for dysuria.  Musculoskeletal: Negative for joint swelling.  Skin: Negative for rash.  Neurological: Negative for headaches.  Hematological: Does not bruise/bleed easily.  Psychiatric/Behavioral: Negative for dysphoric mood. The patient is not nervous/anxious.        Objective:   Physical Exam Obese male in no acute distress Nose without purulence or discharge noted No skin breakdown or pressure necrosis from the C Pap mask Neck without lymphadenopathy or thyromegaly Chest with mild basilar crackles, but adequate airflow and no wheezes Cardiac exam with slightly irregular rhythm, 2/6 systolic murmur Lower extremities with 1+ edema, no cyanosis Alert and oriented, moves all 4  extremities.       Assessment & Plan:

## 2014-08-08 NOTE — Assessment & Plan Note (Signed)
The patient is doing well on C Pap, but is having mask issues and probably needs to be replaced. He feels that he sleeps well with his device, and I've encouraged him to work aggressively on weight loss.

## 2014-08-08 NOTE — Patient Instructions (Signed)
Will get you new cpap supplies Work on weight loss No change in breathing medications followup with me again in 62mos, but call if having breathing issues.

## 2014-08-09 ENCOUNTER — Encounter: Payer: Self-pay | Admitting: Interventional Cardiology

## 2014-08-09 ENCOUNTER — Ambulatory Visit (INDEPENDENT_AMBULATORY_CARE_PROVIDER_SITE_OTHER): Payer: Medicare Other | Admitting: Interventional Cardiology

## 2014-08-09 VITALS — BP 128/76 | HR 74 | Ht 74.0 in | Wt 273.0 lb

## 2014-08-09 DIAGNOSIS — I442 Atrioventricular block, complete: Secondary | ICD-10-CM | POA: Diagnosis not present

## 2014-08-09 DIAGNOSIS — J438 Other emphysema: Secondary | ICD-10-CM | POA: Diagnosis not present

## 2014-08-09 DIAGNOSIS — I480 Paroxysmal atrial fibrillation: Secondary | ICD-10-CM

## 2014-08-09 DIAGNOSIS — I429 Cardiomyopathy, unspecified: Secondary | ICD-10-CM | POA: Diagnosis not present

## 2014-08-09 DIAGNOSIS — I428 Other cardiomyopathies: Secondary | ICD-10-CM

## 2014-08-09 DIAGNOSIS — I519 Heart disease, unspecified: Secondary | ICD-10-CM

## 2014-08-09 DIAGNOSIS — Z7901 Long term (current) use of anticoagulants: Secondary | ICD-10-CM | POA: Diagnosis not present

## 2014-08-09 DIAGNOSIS — Z5181 Encounter for therapeutic drug level monitoring: Secondary | ICD-10-CM

## 2014-08-09 NOTE — Progress Notes (Signed)
Cardiology Office Note   Date:  08/09/2014   ID:  M[facility], DOB 20-Sep-1933, MRN [id number]  PCP:  Barbette Merino, MD  Cardiologist:   Sinclair Grooms, MD   No chief complaint on file.     History of Present Illness: [facility] is a 81 y.o. male who presents for follow-up of nonischemic cardiomyopathy. He has class III chronic systolic heart failure. He denies orthopnea. There is chronic lower extremity swelling but this is been stable. Over the past 6 months weights have been stable. He denies any chest discomfort. He has not had syncope. His device is at near elective replacement indicator. This is being monitored in the device clinic. He have the case medication compliance.    Past Medical History  Diagnosis Date  . Nonischemic cardiomyopathy     a. EF 35%. b. s/p CRT-D device implanted - initially 2002 in [place], gen change 2011 (MDT).  . HTN (hypertension)   . DVT (deep venous thrombosis)     a. Recurrent DVTs. b. s/p IVC filter 2007. c. DVTs dx 05/2013 - started back on Coumadin  . Gout   . COPD (chronic obstructive pulmonary disease)   . Chronic systolic CHF (congestive heart failure)   . PAF (paroxysmal atrial fibrillation)   . GERD (gastroesophageal reflux disease)   . Left fibular fracture     a. 03/2013 - nonsurgical mgmt.  . Hemoptysis     a. H/o of hemoptysis with pulmonary fibrosis (patient has refused biopsies in the past).  . OSA on CPAP   . Chronic kidney disease (CKD), stage III (moderate)   . Chronic respiratory failure     a. On home O2 - 2-3L 24/7  . Retroperitoneal bleed     a. Remotely on Coumadin (per Dr. Jackalyn Lombard prior office note).  . 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  . Atrial tachycardia     a. s/p ablation 05/2013.    Past Surgical History  Procedure Laterality Date  . Cardiac defibrillator placement      initial BiV ICD 09/22/00 in [place].  Gen change (MDT) by Dr Leonia Reeves 10/30/09  . Video bronchoscopy  02/26/2012   Procedure: VIDEO BRONCHOSCOPY WITHOUT FLUORO;  Surgeon: Kathee Delton, MD;  Location: [facility];  Service: Cardiopulmonary;  Laterality: Bilateral;  . Foot surgery Right     "removed a knot off top of my foot" (06/21/2013)  . Tonsillectomy  1940's  . Appendectomy  1940's    "I think so"  . Vena cava filter placement  2007    [name] 05/25/2013 (06/21/2013)  . Supraventricular tachycardia ablation N/A 06/25/2013    Procedure: SUPRAVENTRICULAR TACHYCARDIA ABLATION;  Surgeon: Evans Lance, MD;  Location: [facility] CATH LAB;  Service: Cardiovascular;  Laterality: N/A;     Current Outpatient Prescriptions  Medication Sig Dispense Refill  . Ascorbic Acid (VITAMIN C) 1000 MG tablet Take 1,000 mg by mouth every morning.     . budesonide-formoterol (SYMBICORT) 160-4.5 MCG/ACT inhaler Inhale 2 puffs into the lungs 2 (two) times daily. 3 Inhaler 1  . calcitRIOL (ROCALTROL) 0.5 MCG capsule Take 0.5 mcg by mouth every other day.     Marland Kitchen DIGITEK 125 MCG tablet TAKE 1 TABLET EVERY MORNING 90 tablet 0  . Febuxostat (ULORIC) 80 MG TABS Take 80 mg by mouth every morning.     . furosemide (LASIX) 40 MG tablet Take 3 tablets (120 mg total) by mouth 2 (two) times daily.    Marland Kitchen levalbuterol [facility]  HFA) 45 MCG/ACT inhaler Inhale 2 puffs into the lungs every 6 (six) hours as needed for wheezing.    . Olopatadine HCl (PATADAY) 0.2 % SOLN Place 1 drop into both eyes every morning.    . pregabalin (LYRICA) 150 MG capsule Take 150 mg by mouth 2 (two) times daily.      . sotalol (BETAPACE) 160 MG tablet Take 1 tablet (160 mg total) by mouth daily. 90 tablet 1  . Tamsulosin HCl (FLOMAX) 0.4 MG CAPS Take 0.4 mg by mouth every morning.     . tiotropium (SPIRIVA) 18 MCG inhalation capsule Place 1 capsule (18 mcg total) into inhaler and inhale every morning. 90 capsule 3  . warfarin (COUMADIN) 5 MG tablet Take as directed by coumadin clinic 150 tablet 0   No current facility-administered medications for this visit.     Allergies:   Amiodarone    Social History:  The patient  reports that he has quit smoking. His smoking use included Cigarettes. He has a 45 pack-year smoking history. He has never used smokeless tobacco. He reports that he uses illicit drugs (Marijuana). He reports that he does not drink alcohol.   Family History:  The patient's family history includes Heart disease in his mother.    ROS:  Please see the history of present illness.   Otherwise, review of systems are positive for low back and knee arthritis to prevent fluid mobility..   All other systems are reviewed and negative.    PHYSICAL EXAM: VS:  BP 128/76 mmHg  Pulse 74  Ht 6\' 2"  (1.88 m)  Wt 273 lb (123.832 kg)  BMI 35.04 kg/m2  SpO2 90% , BMI Body mass index is 35.04 kg/(m^2). GEN: Well nourished, well developed, in no acute distress HEENT: normal Neck: no JVD, carotid bruits, or masses Cardiac: RRR; no murmurs, rubs, or gallops,no edema  Respiratory:  clear to auscultation bilaterally, normal work of breathing GI: soft, nontender, nondistended, + BS MS: no deformity or atrophy Skin: warm and dry, no rash Neuro:  Strength and sensation are intact Psych: euthymic mood, full affect   EKG:  EKG is not ordered today. The ekg ordered today demonstrates not done   Recent Labs: 03/01/2014: BUN 20; Creatinine 1.5; Potassium 3.4*; Sodium 141    Lipid Panel No results found for: CHOL, TRIG, HDL, CHOLHDL, VLDL, LDLCALC, LDLDIRECT    Wt Readings from Last 3 Encounters:  08/09/14 273 lb (123.832 kg)  08/08/14 273 lb (123.832 kg)  07/14/14 269 lb 12.8 oz (122.38 kg)      Other studies Reviewed: Additional studies/ records that were reviewed today include: . Review of the above records demonstrates: Near ERI on device   ASSESSMENT AND PLAN:  1.  Nonischemic cardiomyopathy with chronic systolic heart failure, class III, stable without volume overload 2. History of paroxysmal atrial flutter/tachycardia, currently  stable 3. Chronic anticoagulation therapy without bleeding complications 4. History of complete heart block, with currently functioning device but at elective replacement indicator 5. Chronic kidney disease, stable and volume dependent   Current medicines are reviewed at length with the patient today.  The patient does not have concerns regarding medicines.  The following changes have been made:  no change  Labs/ tests ordered today include:  No orders of the defined types were placed in this encounter.     Disposition:   FU with Linard Millers in 6 months   Signed, Sinclair Grooms, MD  08/09/2014 10:23 AM    [facility] [place], [place], [place]  [address] Phone: [phone]; Fax: [phone]

## 2014-08-09 NOTE — Patient Instructions (Addendum)
Your physician recommends that you continue on your current medications as directed. Please refer to the Current Medication list given to you today.  Your physician wants you to follow-up in: 6 months with Dr.Smith You will receive a reminder letter in the mail two months in advance. If you don't receive a letter, please call our office to schedule the follow-up appointment.  

## 2014-08-09 NOTE — Telephone Encounter (Signed)
lmtcb for pt.  

## 2014-08-11 NOTE — Telephone Encounter (Signed)
Order placed; pt aware PCCs will be contacting him to schedule.  He verbalized understanding and voiced no further questions or concerns at this time.

## 2014-08-15 ENCOUNTER — Ambulatory Visit: Payer: Medicare Other | Admitting: *Deleted

## 2014-08-15 ENCOUNTER — Ambulatory Visit (HOSPITAL_COMMUNITY): Payer: Medicare Other | Attending: Internal Medicine

## 2014-08-15 DIAGNOSIS — I5023 Acute on chronic systolic (congestive) heart failure: Secondary | ICD-10-CM | POA: Insufficient documentation

## 2014-08-15 DIAGNOSIS — I480 Paroxysmal atrial fibrillation: Secondary | ICD-10-CM | POA: Insufficient documentation

## 2014-08-15 DIAGNOSIS — I519 Heart disease, unspecified: Secondary | ICD-10-CM

## 2014-08-15 LAB — MDC_IDC_ENUM_SESS_TYPE_INCLINIC
Battery Voltage: 2.62 V
Brady Statistic AS VP Percent: 12.14 %
Brady Statistic AS VS Percent: 0.18 %
Brady Statistic RA Percent Paced: 87.68 %
HIGH POWER IMPEDANCE MEASURED VALUE: 57 Ohm
HighPow Impedance: 46 Ohm
Lead Channel Impedance Value: 342 Ohm
Lead Channel Impedance Value: 532 Ohm
Lead Channel Pacing Threshold Amplitude: 1 V
Lead Channel Pacing Threshold Amplitude: 1.25 V
Lead Channel Pacing Threshold Amplitude: 1.25 V
Lead Channel Pacing Threshold Pulse Width: 0.4 ms
Lead Channel Pacing Threshold Pulse Width: 0.5 ms
Lead Channel Sensing Intrinsic Amplitude: 1.75 mV
Lead Channel Sensing Intrinsic Amplitude: 1.75 mV
Lead Channel Sensing Intrinsic Amplitude: 12.375 mV
Lead Channel Sensing Intrinsic Amplitude: 12.375 mV
Lead Channel Setting Pacing Amplitude: 2.5 V
Lead Channel Setting Pacing Pulse Width: 0.4 ms
Lead Channel Setting Pacing Pulse Width: 0.5 ms
Lead Channel Setting Sensing Sensitivity: 0.3 mV
MDC IDC MSMT LEADCHNL LV IMPEDANCE VALUE: 703 Ohm
MDC IDC MSMT LEADCHNL RA IMPEDANCE VALUE: 418 Ohm
MDC IDC MSMT LEADCHNL RA PACING THRESHOLD PULSEWIDTH: 0.4 ms
MDC IDC MSMT LEADCHNL RV IMPEDANCE VALUE: 285 Ohm
MDC IDC SESS DTM: 20160215104039
MDC IDC SET LEADCHNL LV PACING AMPLITUDE: 2 V
MDC IDC SET LEADCHNL RV PACING AMPLITUDE: 2.5 V
MDC IDC SET ZONE DETECTION INTERVAL: 300 ms
MDC IDC SET ZONE DETECTION INTERVAL: 400 ms
MDC IDC STAT BRADY AP VP PERCENT: 87.62 %
MDC IDC STAT BRADY AP VS PERCENT: 0.06 %
MDC IDC STAT BRADY RV PERCENT PACED: 99.76 %
Zone Setting Detection Interval: 350 ms
Zone Setting Detection Interval: 450 ms

## 2014-08-15 NOTE — Progress Notes (Signed)
2D Echo completed. 08/15/2014

## 2014-08-15 NOTE — Progress Notes (Signed)
Pt seen in clinic for follow up of ICD.  Battery check only- N/C.  No complaints of chest pain, shortness of breath, dizziness, palpitations, or shocks.  Device functioning normally at this time.  For full details, see PaceArt report.  No programming changes made today.  Ranee Gosselin, RN, BSN 08/15/2014 10:26 AM

## 2014-08-19 ENCOUNTER — Encounter: Payer: Self-pay | Admitting: Internal Medicine

## 2014-08-22 ENCOUNTER — Telehealth: Payer: Self-pay | Admitting: *Deleted

## 2014-08-22 NOTE — Telephone Encounter (Signed)
Spoke with patient after receiving remote transmission. Patient made aware that his device has reached ERI and our scheduler will contact him to set up appointment to discuss generator replacement with Dr. Rayann Heman.

## 2014-08-23 ENCOUNTER — Other Ambulatory Visit (HOSPITAL_BASED_OUTPATIENT_CLINIC_OR_DEPARTMENT_OTHER): Payer: Medicare Other

## 2014-08-24 DIAGNOSIS — N183 Chronic kidney disease, stage 3 (moderate): Secondary | ICD-10-CM | POA: Diagnosis not present

## 2014-08-24 DIAGNOSIS — M109 Gout, unspecified: Secondary | ICD-10-CM | POA: Diagnosis not present

## 2014-08-24 DIAGNOSIS — N2581 Secondary hyperparathyroidism of renal origin: Secondary | ICD-10-CM | POA: Diagnosis not present

## 2014-08-24 DIAGNOSIS — N189 Chronic kidney disease, unspecified: Secondary | ICD-10-CM | POA: Diagnosis not present

## 2014-08-24 DIAGNOSIS — I499 Cardiac arrhythmia, unspecified: Secondary | ICD-10-CM | POA: Diagnosis not present

## 2014-08-24 DIAGNOSIS — D631 Anemia in chronic kidney disease: Secondary | ICD-10-CM | POA: Diagnosis not present

## 2014-08-25 ENCOUNTER — Encounter: Payer: Self-pay | Admitting: *Deleted

## 2014-08-25 ENCOUNTER — Telehealth: Payer: Self-pay | Admitting: Internal Medicine

## 2014-08-25 ENCOUNTER — Ambulatory Visit (INDEPENDENT_AMBULATORY_CARE_PROVIDER_SITE_OTHER): Payer: Medicare Other | Admitting: *Deleted

## 2014-08-25 ENCOUNTER — Other Ambulatory Visit: Payer: Self-pay | Admitting: *Deleted

## 2014-08-25 DIAGNOSIS — I824Y9 Acute embolism and thrombosis of unspecified deep veins of unspecified proximal lower extremity: Secondary | ICD-10-CM

## 2014-08-25 DIAGNOSIS — I519 Heart disease, unspecified: Secondary | ICD-10-CM

## 2014-08-25 DIAGNOSIS — I480 Paroxysmal atrial fibrillation: Secondary | ICD-10-CM

## 2014-08-25 DIAGNOSIS — Z5181 Encounter for therapeutic drug level monitoring: Secondary | ICD-10-CM | POA: Diagnosis not present

## 2014-08-25 DIAGNOSIS — I442 Atrioventricular block, complete: Secondary | ICD-10-CM

## 2014-08-25 LAB — POCT INR: INR: 2.5

## 2014-08-25 NOTE — Patient Instructions (Addendum)
2/27 take your last dose  2/28 no coumadin  2/29 no coumadin   3/1 Procedure Day  Dr. Rayann Heman will tell you when to resume Coumadin and whee approved resume normal dose

## 2014-08-25 NOTE — Telephone Encounter (Signed)
[facility] spoke with patient and let him know he is ERI and will need to come in to schedule a generator change out

## 2014-08-25 NOTE — Telephone Encounter (Signed)
New Msg        Pt states he's returning call from today.     Please call back

## 2014-08-26 LAB — CBC WITH DIFFERENTIAL/PLATELET
BASOS PCT: 0.4 % (ref 0.0–3.0)
Basophils Absolute: 0 10*3/uL (ref 0.0–0.1)
EOS ABS: 0 10*3/uL (ref 0.0–0.7)
EOS PCT: 1.1 % (ref 0.0–5.0)
HEMATOCRIT: 39.9 % (ref 39.0–52.0)
Hemoglobin: 13.1 g/dL (ref 13.0–17.0)
Lymphocytes Relative: 27.4 % (ref 12.0–46.0)
Lymphs Abs: 1.3 10*3/uL (ref 0.7–4.0)
MCHC: 32.9 g/dL (ref 30.0–36.0)
MCV: 85.7 fl (ref 78.0–100.0)
MONO ABS: 0.4 10*3/uL (ref 0.1–1.0)
Monocytes Relative: 8.8 % (ref 3.0–12.0)
Neutro Abs: 2.8 10*3/uL (ref 1.4–7.7)
Neutrophils Relative %: 62.3 % (ref 43.0–77.0)
Platelets: 140 10*3/uL — ABNORMAL LOW (ref 150.0–400.0)
RBC: 4.65 Mil/uL (ref 4.22–5.81)
RDW: 15.2 % (ref 11.5–15.5)
WBC: 4.6 10*3/uL (ref 4.0–10.5)

## 2014-08-26 LAB — BASIC METABOLIC PANEL
BUN: 32 mg/dL — ABNORMAL HIGH (ref 6–23)
CO2: 35 mEq/L — ABNORMAL HIGH (ref 19–32)
CREATININE: 1.6 mg/dL — AB (ref 0.40–1.50)
Calcium: 9.5 mg/dL (ref 8.4–10.5)
Chloride: 102 mEq/L (ref 96–112)
GFR: 53.88 mL/min — ABNORMAL LOW (ref >60.00)
GLUCOSE: 107 mg/dL — AB (ref 70–99)
Potassium: 4.2 mEq/L (ref 3.5–5.1)
Sodium: 140 mEq/L (ref 135–145)

## 2014-08-29 DIAGNOSIS — G4733 Obstructive sleep apnea (adult) (pediatric): Secondary | ICD-10-CM | POA: Diagnosis not present

## 2014-08-29 DIAGNOSIS — I442 Atrioventricular block, complete: Secondary | ICD-10-CM | POA: Diagnosis not present

## 2014-08-29 DIAGNOSIS — Z888 Allergy status to other drugs, medicaments and biological substances status: Secondary | ICD-10-CM | POA: Diagnosis not present

## 2014-08-29 DIAGNOSIS — J449 Chronic obstructive pulmonary disease, unspecified: Secondary | ICD-10-CM | POA: Diagnosis not present

## 2014-08-29 DIAGNOSIS — I129 Hypertensive chronic kidney disease with stage 1 through stage 4 chronic kidney disease, or unspecified chronic kidney disease: Secondary | ICD-10-CM | POA: Diagnosis not present

## 2014-08-29 DIAGNOSIS — Z86718 Personal history of other venous thrombosis and embolism: Secondary | ICD-10-CM | POA: Diagnosis not present

## 2014-08-29 DIAGNOSIS — Z7901 Long term (current) use of anticoagulants: Secondary | ICD-10-CM | POA: Diagnosis not present

## 2014-08-29 DIAGNOSIS — M109 Gout, unspecified: Secondary | ICD-10-CM | POA: Diagnosis not present

## 2014-08-29 DIAGNOSIS — K219 Gastro-esophageal reflux disease without esophagitis: Secondary | ICD-10-CM | POA: Diagnosis not present

## 2014-08-29 DIAGNOSIS — I420 Dilated cardiomyopathy: Secondary | ICD-10-CM | POA: Diagnosis not present

## 2014-08-29 DIAGNOSIS — E0590 Thyrotoxicosis, unspecified without thyrotoxic crisis or storm: Secondary | ICD-10-CM | POA: Diagnosis not present

## 2014-08-29 DIAGNOSIS — Z4502 Encounter for adjustment and management of automatic implantable cardiac defibrillator: Secondary | ICD-10-CM | POA: Diagnosis not present

## 2014-08-29 DIAGNOSIS — I5022 Chronic systolic (congestive) heart failure: Secondary | ICD-10-CM | POA: Diagnosis not present

## 2014-08-29 DIAGNOSIS — N183 Chronic kidney disease, stage 3 (moderate): Secondary | ICD-10-CM | POA: Diagnosis not present

## 2014-08-29 DIAGNOSIS — Z9981 Dependence on supplemental oxygen: Secondary | ICD-10-CM | POA: Diagnosis not present

## 2014-08-29 DIAGNOSIS — I429 Cardiomyopathy, unspecified: Secondary | ICD-10-CM | POA: Diagnosis not present

## 2014-08-29 DIAGNOSIS — Z87891 Personal history of nicotine dependence: Secondary | ICD-10-CM | POA: Diagnosis not present

## 2014-08-29 DIAGNOSIS — I480 Paroxysmal atrial fibrillation: Secondary | ICD-10-CM | POA: Diagnosis not present

## 2014-08-29 DIAGNOSIS — I4892 Unspecified atrial flutter: Secondary | ICD-10-CM | POA: Diagnosis not present

## 2014-08-29 MED ORDER — DEXTROSE 5 % IV SOLN
3.0000 g | INTRAVENOUS | Status: AC
  Filled 2014-08-29: qty 3000

## 2014-08-29 MED ORDER — SODIUM CHLORIDE 0.9 % IR SOLN
80.0000 mg | Status: AC
  Filled 2014-08-29: qty 2

## 2014-08-29 MED ORDER — SODIUM CHLORIDE 0.9 % IV SOLN
INTRAVENOUS | Status: DC
  Administered 2014-08-30: 13:00:00 via INTRAVENOUS

## 2014-08-30 ENCOUNTER — Encounter (HOSPITAL_COMMUNITY): Payer: Self-pay | Admitting: Internal Medicine

## 2014-08-30 ENCOUNTER — Ambulatory Visit (HOSPITAL_COMMUNITY)
Admission: RE | Admit: 2014-08-30 | Discharge: 2014-08-30 | Disposition: A | Discharge status: Home / Self Care | Payer: Medicare Other | Source: Ambulatory Visit | Attending: Internal Medicine | Admitting: Internal Medicine

## 2014-08-30 ENCOUNTER — Encounter (HOSPITAL_COMMUNITY)
Admission: RE | Disposition: A | Discharge status: Home / Self Care | Payer: Self-pay | Source: Ambulatory Visit | Attending: Internal Medicine

## 2014-08-30 DIAGNOSIS — I480 Paroxysmal atrial fibrillation: Secondary | ICD-10-CM | POA: Insufficient documentation

## 2014-08-30 DIAGNOSIS — I129 Hypertensive chronic kidney disease with stage 1 through stage 4 chronic kidney disease, or unspecified chronic kidney disease: Secondary | ICD-10-CM | POA: Insufficient documentation

## 2014-08-30 DIAGNOSIS — Z888 Allergy status to other drugs, medicaments and biological substances status: Secondary | ICD-10-CM | POA: Insufficient documentation

## 2014-08-30 DIAGNOSIS — E0590 Thyrotoxicosis, unspecified without thyrotoxic crisis or storm: Secondary | ICD-10-CM | POA: Insufficient documentation

## 2014-08-30 DIAGNOSIS — I5022 Chronic systolic (congestive) heart failure: Secondary | ICD-10-CM | POA: Insufficient documentation

## 2014-08-30 DIAGNOSIS — I429 Cardiomyopathy, unspecified: Secondary | ICD-10-CM | POA: Insufficient documentation

## 2014-08-30 DIAGNOSIS — G4733 Obstructive sleep apnea (adult) (pediatric): Secondary | ICD-10-CM | POA: Insufficient documentation

## 2014-08-30 DIAGNOSIS — Z7901 Long term (current) use of anticoagulants: Secondary | ICD-10-CM | POA: Insufficient documentation

## 2014-08-30 DIAGNOSIS — Z86718 Personal history of other venous thrombosis and embolism: Secondary | ICD-10-CM | POA: Insufficient documentation

## 2014-08-30 DIAGNOSIS — N183 Chronic kidney disease, stage 3 (moderate): Secondary | ICD-10-CM | POA: Insufficient documentation

## 2014-08-30 DIAGNOSIS — Z9981 Dependence on supplemental oxygen: Secondary | ICD-10-CM | POA: Insufficient documentation

## 2014-08-30 DIAGNOSIS — J449 Chronic obstructive pulmonary disease, unspecified: Secondary | ICD-10-CM | POA: Insufficient documentation

## 2014-08-30 DIAGNOSIS — I4892 Unspecified atrial flutter: Secondary | ICD-10-CM | POA: Insufficient documentation

## 2014-08-30 DIAGNOSIS — K219 Gastro-esophageal reflux disease without esophagitis: Secondary | ICD-10-CM | POA: Insufficient documentation

## 2014-08-30 DIAGNOSIS — I420 Dilated cardiomyopathy: Secondary | ICD-10-CM | POA: Insufficient documentation

## 2014-08-30 DIAGNOSIS — I428 Other cardiomyopathies: Secondary | ICD-10-CM

## 2014-08-30 DIAGNOSIS — I442 Atrioventricular block, complete: Secondary | ICD-10-CM | POA: Diagnosis not present

## 2014-08-30 DIAGNOSIS — Z4502 Encounter for adjustment and management of automatic implantable cardiac defibrillator: Secondary | ICD-10-CM | POA: Insufficient documentation

## 2014-08-30 DIAGNOSIS — Z87891 Personal history of nicotine dependence: Secondary | ICD-10-CM | POA: Insufficient documentation

## 2014-08-30 DIAGNOSIS — M109 Gout, unspecified: Secondary | ICD-10-CM | POA: Insufficient documentation

## 2014-08-30 HISTORY — PX: BIV ICD GENERTAOR CHANGE OUT: SHX5745

## 2014-08-30 LAB — PROTIME-INR
INR: 1.98 — ABNORMAL HIGH (ref 0.00–1.49)
Prothrombin Time: 22.7 seconds — ABNORMAL HIGH (ref 11.6–15.2)

## 2014-08-30 LAB — SURGICAL PCR SCREEN
MRSA, PCR: NEGATIVE
Staphylococcus aureus: POSITIVE — AB

## 2014-08-30 SURGERY — BIV ICD GENERTAOR CHANGE OUT
Anesthesia: LOCAL

## 2014-08-30 MED ORDER — MUPIROCIN 2 % EX OINT
TOPICAL_OINTMENT | CUTANEOUS | Status: AC
Start: 2014-08-30 — End: 2014-08-30
  Administered 2014-08-30: 13:00:00
  Filled 2014-08-30: qty 22

## 2014-08-30 MED ORDER — FENTANYL CITRATE 0.05 MG/ML IJ SOLN
INTRAMUSCULAR | Status: AC
  Filled 2014-08-30: qty 2

## 2014-08-30 MED ORDER — SODIUM CHLORIDE 0.9 % IV SOLN: 250.0000 mL | INTRAVENOUS | Status: DC | PRN

## 2014-08-30 MED ORDER — SODIUM CHLORIDE 0.9 % IJ SOLN: 3.0000 mL | Freq: Two times a day (BID) | INTRAMUSCULAR | Status: DC

## 2014-08-30 MED ORDER — CHLORHEXIDINE GLUCONATE 4 % EX LIQD: 60.0000 mL | Freq: Once | CUTANEOUS | Status: DC

## 2014-08-30 MED ORDER — LIDOCAINE HCL (PF) 1 % IJ SOLN
INTRAMUSCULAR | Status: AC
  Filled 2014-08-30: qty 30

## 2014-08-30 MED ORDER — LIDOCAINE HCL (PF) 1 % IJ SOLN
INTRAMUSCULAR | Status: AC
Start: 2014-08-30 — End: 2014-08-30
  Filled 2014-08-30: qty 30

## 2014-08-30 MED ORDER — MIDAZOLAM HCL 5 MG/5ML IJ SOLN
INTRAMUSCULAR | Status: AC
  Filled 2014-08-30: qty 5

## 2014-08-30 MED ORDER — ONDANSETRON HCL 4 MG/2ML IJ SOLN
4.0000 mg | Freq: Four times a day (QID) | INTRAMUSCULAR | Status: DC | PRN
Start: 2014-08-30 — End: 2014-08-30

## 2014-08-30 MED ORDER — ACETAMINOPHEN 325 MG PO TABS
325.0000 mg | ORAL_TABLET | ORAL | Status: DC | PRN
  Filled 2014-08-30: qty 2

## 2014-08-30 MED ORDER — SODIUM CHLORIDE 0.9 % IJ SOLN: 3.0000 mL | INTRAMUSCULAR | Status: DC | PRN

## 2014-08-30 NOTE — H&P (Signed)
Electrophysiology Note  ID: Eward, Rutigliano [birth year], MRN [id number]  PCP: Barbette Merino, MD Cardiologist: Dr Tamala Julian Primary Electrophysiologist: Timia Casselman  Chief Complaint  Patient presents with  . Shortness of Breath    History of Present Illness: Laquinton Hemme is a 81 y.o. male who presents today for BiV ICD generator change. The patient has stable SOB. He denies CP. His ICD is approaching ERI. He has chronic renal failure and edema which is stable. He is unaware of AF/ atach. Today, he denies symptoms of palpitations, chest pain, claudication, dizziness, presyncope, syncope, bleeding, or neurologic sequela. The patient is tolerating medications without difficulties and is otherwise without complaint today.   Past Medical History  Diagnosis Date  . Nonischemic cardiomyopathy     a. EF 35%. b. s/p CRT-D device implanted - initially 2002 in [place], gen change 2011 (MDT).  . HTN (hypertension)   . DVT (deep venous thrombosis)     a. Recurrent DVTs. b. s/p IVC filter 2007. c. DVTs dx 05/2013 - started back on Coumadin  . Gout   . COPD (chronic obstructive pulmonary disease)   . Chronic systolic CHF (congestive heart failure)   . PAF (paroxysmal atrial fibrillation)   . GERD (gastroesophageal reflux disease)   . Left fibular fracture     a. 03/2013 - nonsurgical mgmt.  . Hemoptysis     a. H/o of hemoptysis with pulmonary fibrosis (patient has refused biopsies in the past).  . OSA on CPAP   . Chronic kidney disease (CKD), stage III (moderate)   . Chronic respiratory failure     a. On home O2 - 2-3L 24/7  . Retroperitoneal bleed     a. Remotely on Coumadin (per Dr. Jackalyn Lombard prior office note).  . 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  . Atrial tachycardia     a. s/p ablation 05/2013.   Past Surgical History  Procedure Laterality Date  . Cardiac  defibrillator placement      initial BiV ICD 09/22/00 in [place]. Gen change (MDT) by Dr Leonia Reeves 10/30/09  . Video bronchoscopy  02/26/2012    Procedure: VIDEO BRONCHOSCOPY WITHOUT FLUORO; Surgeon: Kathee Delton, MD; Location: [facility]; Service: Cardiopulmonary; Laterality: Bilateral;  . Foot surgery Right     "removed a knot off top of my foot" (06/21/2013)  . Tonsillectomy  1940's  . Appendectomy  1940's    "I think so"  . Vena cava filter placement  2007    [name] 05/25/2013 (06/21/2013)  . Supraventricular tachycardia ablation N/A 06/25/2013    Procedure: SUPRAVENTRICULAR TACHYCARDIA ABLATION; Surgeon: Evans Lance, MD; Location: [facility] CATH LAB; Service: Cardiovascular; Laterality: N/A;     Current Outpatient Prescriptions  Medication Sig Dispense Refill  . Ascorbic Acid (VITAMIN C) 1000 MG tablet Take 1,000 mg by mouth every morning.     . budesonide-formoterol (SYMBICORT) 160-4.5 MCG/ACT inhaler Inhale 2 puffs into the lungs 2 (two) times daily. 3 Inhaler 1  . calcitRIOL (ROCALTROL) 0.5 MCG capsule Take 0.5 mcg by mouth every other day.     Marland Kitchen DIGITEK 125 MCG tablet TAKE 1 TABLET EVERY MORNING 90 tablet 0  . Febuxostat (ULORIC) 80 MG TABS Take 80 mg by mouth every morning.     . furosemide (LASIX) 40 MG tablet Take 3 tablets (120 mg total) by mouth 2 (two) times daily.    Marland Kitchen levalbuterol (XOPENEX HFA) 45 MCG/ACT inhaler Inhale 2 puffs into the lungs every 6 (six) hours as needed for wheezing.    Marland Kitchen  Olopatadine HCl (PATADAY) 0.2 % SOLN Place 1 drop into both eyes every morning.    . pregabalin (LYRICA) 150 MG capsule Take 150 mg by mouth 2 (two) times daily.     . sotalol (BETAPACE) 160 MG tablet Take 1 tablet (160 mg total) by mouth daily. 90 tablet 1  . Tamsulosin HCl (FLOMAX) 0.4 MG CAPS Take 0.4 mg by mouth every morning.     . tiotropium (SPIRIVA) 18 MCG inhalation  capsule Place 1 capsule (18 mcg total) into inhaler and inhale every morning. 90 capsule 3  . warfarin (COUMADIN) 5 MG tablet Take as directed by coumadin clinic 150 tablet 0   No current facility-administered medications for this visit.    Allergies: Amiodarone   Social History: The patient  reports that he has quit smoking. His smoking use included Cigarettes. He has a 45 pack-year smoking history. He has never used smokeless tobacco. He reports that he uses illicit drugs (Marijuana). He reports that he does not drink alcohol.   Family History: The patient's family history includes Heart disease in his mother.    ROS: Please see the history of present illness. All other systems are reviewed and negative.    PHYSICAL EXAM:   Filed Vitals:   08/30/14 1206  BP: 121/81  Pulse: 76  Temp: 97.7 F (36.5 C)  Resp: 18    GEN: Well nourished, well developed, in no acute distress  HEENT: normal  Neck: no JVD, carotid bruits, or masses Cardiac: RRR  Respiratory: Decreased BS, prolonged expiratory phase, normal work of breathing GI: soft, nontender, nondistended, + BS MS: no deformity or atrophy  Skin: warm and dry, device pocket is well healed Neuro: Strength and sensation are intact Psych: euthymic mood, full affect  EKG: EKG is ordered today. The ekg ordered today shows sinus with BiV pacing  Device interrogation is reviewed today in detail. See PaceArt for details.  Recent Labs: 03/01/2014: BUN 20; Creatinine 1.5; Potassium 3.4*; Sodium 141  No results found for requested labs within last 365 days.     CrCl cannot be calculated (Patient has no serum creatinine result on file.).   Wt Readings from Last 3 Encounters:  07/14/14 269 lb 12.8 oz (122.38 kg)  04/07/14 276 lb 12.8 oz (125.556 kg)  03/01/14 272 lb (123.378 kg)    ASSESSMENT AND PLAN:  1. Nonischemic CM/ complete heart block  Chronic SOB Echo 2/16 reveals EF  20% Medical therapy is limited by renal failure. Not on a beta blocker due to lung disease.  He is not on an ace inhibitor/ ARB due to renal failure Risks, benefits, and alternatives to BiV ICD pulse generator replacement were discussed in detail today.  The patient understands that risks include but are not limited to bleeding, infection, pneumothorax, perforation, tamponade, vascular damage, renal failure, MI, stroke, death, inappropriate shocks, damage to his existing leads, and lead dislodgement and wishes to proceed.        ICD Criteria  Current LVEF:20% ;Obtained < 1 month ago.  NYHA Functional Classification: Class III  Heart Failure History:  Yes, Duration of heart failure since onset is > 9 months  Non-Ischemic Dilated Cardiomyopathy History:  Yes, timeframe is > 9 months  Atrial Fibrillation/Atrial Flutter:  Yes, A-Fib/A-Flutter type: Paroxysmal.  Ventricular Tachycardia History:  No.  Cardiac Arrest History:  No  History of Syndromes with Risk of Sudden Death:  No.  Previous ICD:  Yes, ICD Type:  CRT-D, Reason for ICD:  Primary prevention.  35%  Electrophysiology Study: No.  Prior MI: No.  PPM: No.  OSA:  No  Patient Life Expectancy of >=1 year: Yes.  Anticoagulation Therapy:  Patient is on anticoagulation therapy, anticoagulation was held prior to procedure.   Beta Blocker Therapy:  No, Reason not on Beta Blocker therapy: chronic lung disease  Ace Inhibitor/ARB Therapy:  No, reason is due to chronic renal faiulre

## 2014-08-30 NOTE — Op Note (Signed)
SURGEON:  Thompson Grayer, MD      PREPROCEDURE DIAGNOSES:   1. nonschemic cardiomyopathy.   2. New York Heart Association class III, heart failure chronically.   3. Complete heart block      POSTPROCEDURE DIAGNOSES:   1. nonschemic cardiomyopathy.   2. New York Heart Association class III, heart failure chronically.   3. Complete heart block   PROCEDURES:    1.  ICD pulse generator replacement   2. Skin pocket revision     INTRODUCTION:  Maurice Delgado is a 81 y.o. male an ischemic CM (EF 20%--> previously 35% prior to CRT), NYHA Class III and complete heart block with prior BiV ICD implantation who presents today for BiV ICD pulse generator replacement for ERI battery status.       DESCRIPTION OF PROCEDURE:  Informed written consent was obtained and the patient was brought to the electrophysiology lab in the fasting state.  The patient required no sedation for the procedure today.  The patient's left chest was prepped and draped in the usual sterile fashion by the EP lab staff.  The skin overlying the left deltopectoral region was infiltrated with lidocaine for local analgesia.  A 5-cm incision was made over the existing ICD pocket.  Electrocautery was used to assure hemostasis.  The device was exposed and removed from the pocket  The device was disconnected from the leads.  The leads were examined thoroughly and their integrity confirmed to be intact.   The right atrial lead was confirmed to be a Medtronic model [id number]  (serial # [id number]) lead implanted 09/22/2000.  The right ventricular lead was confirmed to be a Medtronic model [id number] (serial number [id number]) right ventricular defibrillator lead implanted on the same date as the atrial lead.The left ventricular lead was confirmed to be a Medtronic model [id number] (serial number [id number]) left ventricular lead  implanted on the same date as the atrial lead..   Atrial lead P-waves measured 1.9 mV with an impedance of 418 ohms and a  threshold of 1.0 volts at 0.5 milliseconds.  The right ventricular lead R-wave were not present.  The RV lead impedance was 266 ohms and a threshold of 1.25 volts at 0.5 milliseconds.  Left ventricular lead impedance was 513 Ohms with a threshold of 0.5V @0 .5 sec.  All three leads were then connected to a Medtronic Viva XT CRT-D model DTBA1D1 (SN [id number]) BiV ICD. The pocket was revised to accomodate this new device.   The pocket was  irrigated with copious gentamicin solution.  The defibrillator was placed into the pocket.  The pocket was then closed in 2 layers with 2.0 Vicryl suture  for the subcutaneous and subcuticular layers. EBL<66ml. Steri-Strips and a  sterile dressing were then applied.  DFT testing was not performed today.  He was in sinus rhythm for the procedure.  There were no early apparhent complications.     CONCLUSIONS:   1. Nonischemic cardiomyopathy with chronic New York Heart Association class III heart failure, complete heart block  2. BiV ICD pulse generator replacement for ERI battery status  3. No early apparent complications.    Jeneen Rinks Chivonne Rascon,MD 08/30/2014 5:27 PM

## 2014-08-30 NOTE — Discharge Instructions (Signed)
Pacemaker Battery Change °A pacemaker battery usually lasts 4 to 12 years. Once or twice per year, you will be asked to visit your health care provider to have a full evaluation of your pacemaker. When a battery needs to be replaced, the entire pacemaker is replaced so that you can benefit from new circuitry and any new features that have been added to pacemakers. Most often, this procedure is very simple because the leads are already in place.  °There are many things that affect how long a pacemaker battery will last, including:  °· The age of the pacemaker.   °· The number of leads (1, 2, or 3).   °· The pacemaker work load. If the pacemaker is helping the heart more often, the battery will not last as long as it would if the pacemaker did not need to help the heart.   °· Power (voltage) settings.  °LET YOUR HEALTH CARE PROVIDER KNOW ABOUT:  °· Any allergies you have.   °· All medicines you are taking, including vitamins, herbs, eye drops, creams, and over-the-counter medicines.   °· Previous problems you or members of your family have had with the use of anesthetics.   °· Any blood disorders you have.   °· Previous surgeries you have had, especially since your last pacemaker placement.   °· Medical conditions you have.   °· Possibility of pregnancy, if this applies. °· Symptoms of chest pain, trouble breathing, palpitations, light-headedness, or feelings of an abnormal or irregular heartbeat. °RISKS AND COMPLICATIONS  °Generally, this is a safe procedure. However, as with any procedure, problems can occur and include:  °· Bleeding.   °· Bruising of the skin around where the incision was made.   °· Pain at the incision site.   °· Pulling apart of the skin at the incision site.   °· Infection.   °· Allergic reaction to anesthetics or other medicines used during the procedure.   °People with diabetes may have a temporary increase in their blood sugar after any surgical procedure.  °BEFORE THE PROCEDURE  °· Wash all  of the skin around the area of the chest where the pacemaker is located.   °· Ask your health care provider for help with any medicine adjustments before the pacemaker is replaced.   °· Do not eat or drink anything after midnight on the night before the procedure or as directed by your health care provider. °· Ask your health care provider if you can take a sip of water with any approved medicines the morning of the procedure. °PROCEDURE  °· After giving medicine to numb the skin (local anesthetic), your health care provider will make a cut to reopen the pocket holding the pacemaker.   °· The old pacemaker will be disconnected from its leads.   °· The leads will be tested.   °· If needed, the leads will be replaced. If the leads are functioning properly, the new pacemaker may be connected to the existing leads. °· A heart monitor and the pacemaker programmer will be used to make sure that the new pacemaker is working properly. °· The incision site will then be closed. A dressing will be placed over the pacemaker site. The dressing will be removed 24-48 hours afterward. °AFTER THE PROCEDURE  °· You will be taken to a recovery area after the new pacemaker implant is completed. Your vital signs such as blood pressure, heart rate, breathing, and oxygen levels will be monitored. °· Your health care provider will tell you when you will need to next test your pacemaker or when to return to the office for follow-up   for removal of stitches. °Document Released: 09/25/2006 Document Revised: 11/01/2013 Document Reviewed: 12/30/2012 °ExitCare® Patient Information ©2015 [employer]. This information is not intended to replace advice given to you by your health care provider. Make sure you discuss any questions you have with your health care provider. ° °

## 2014-09-05 ENCOUNTER — Encounter: Payer: Medicare Other | Admitting: Internal Medicine

## 2014-09-08 ENCOUNTER — Ambulatory Visit (INDEPENDENT_AMBULATORY_CARE_PROVIDER_SITE_OTHER): Payer: Medicare Other | Admitting: *Deleted

## 2014-09-08 ENCOUNTER — Ambulatory Visit (INDEPENDENT_AMBULATORY_CARE_PROVIDER_SITE_OTHER): Payer: Medicare Other

## 2014-09-08 DIAGNOSIS — Z5181 Encounter for therapeutic drug level monitoring: Secondary | ICD-10-CM | POA: Diagnosis not present

## 2014-09-08 DIAGNOSIS — I429 Cardiomyopathy, unspecified: Secondary | ICD-10-CM

## 2014-09-08 DIAGNOSIS — I4891 Unspecified atrial fibrillation: Secondary | ICD-10-CM

## 2014-09-08 DIAGNOSIS — I519 Heart disease, unspecified: Secondary | ICD-10-CM

## 2014-09-08 DIAGNOSIS — I442 Atrioventricular block, complete: Secondary | ICD-10-CM

## 2014-09-08 DIAGNOSIS — I480 Paroxysmal atrial fibrillation: Secondary | ICD-10-CM

## 2014-09-08 DIAGNOSIS — I428 Other cardiomyopathies: Secondary | ICD-10-CM

## 2014-09-08 LAB — MDC_IDC_ENUM_SESS_TYPE_INCLINIC
Battery Remaining Longevity: 85 mo
Battery Voltage: 3.11 V
Brady Statistic AP VP Percent: 73.62 %
Brady Statistic AP VS Percent: 0.01 %
Brady Statistic AS VP Percent: 25.88 %
Brady Statistic AS VS Percent: 0.48 %
Brady Statistic RV Percent Paced: 96.99 %
HIGH POWER IMPEDANCE MEASURED VALUE: 47 Ohm
HighPow Impedance: 38 Ohm
HighPow Impedance: 61 Ohm
Lead Channel Impedance Value: 304 Ohm
Lead Channel Impedance Value: 418 Ohm
Lead Channel Impedance Value: 532 Ohm
Lead Channel Impedance Value: 722 Ohm
Lead Channel Pacing Threshold Amplitude: 1.25 V
Lead Channel Pacing Threshold Pulse Width: 0.4 ms
Lead Channel Pacing Threshold Pulse Width: 0.4 ms
Lead Channel Pacing Threshold Pulse Width: 0.5 ms
Lead Channel Sensing Intrinsic Amplitude: 1.875 mV
Lead Channel Setting Pacing Amplitude: 1.5 V
Lead Channel Setting Pacing Amplitude: 2.25 V
Lead Channel Setting Sensing Sensitivity: 0.3 mV
MDC IDC MSMT LEADCHNL LV IMPEDANCE VALUE: 361 Ohm
MDC IDC MSMT LEADCHNL LV PACING THRESHOLD AMPLITUDE: 1 V
MDC IDC MSMT LEADCHNL RA IMPEDANCE VALUE: 304 Ohm
MDC IDC MSMT LEADCHNL RA SENSING INTR AMPL: 2.625 mV
MDC IDC MSMT LEADCHNL RV PACING THRESHOLD AMPLITUDE: 1.25 V
MDC IDC SESS DTM: 20160310102304
MDC IDC SET LEADCHNL LV PACING PULSEWIDTH: 0.5 ms
MDC IDC SET LEADCHNL RV PACING AMPLITUDE: 2.5 V
MDC IDC SET LEADCHNL RV PACING PULSEWIDTH: 0.4 ms
MDC IDC SET ZONE DETECTION INTERVAL: 400 ms
MDC IDC STAT BRADY RA PERCENT PACED: 73.63 %
Zone Setting Detection Interval: 300 ms
Zone Setting Detection Interval: 350 ms
Zone Setting Detection Interval: 450 ms

## 2014-09-08 LAB — POCT INR: INR: 1.6

## 2014-09-08 NOTE — Progress Notes (Signed)

## 2014-09-16 ENCOUNTER — Encounter: Payer: Self-pay | Admitting: Internal Medicine

## 2014-09-22 ENCOUNTER — Ambulatory Visit (INDEPENDENT_AMBULATORY_CARE_PROVIDER_SITE_OTHER): Payer: Medicare Other | Admitting: *Deleted

## 2014-09-22 DIAGNOSIS — I480 Paroxysmal atrial fibrillation: Secondary | ICD-10-CM | POA: Diagnosis not present

## 2014-09-22 DIAGNOSIS — I4891 Unspecified atrial fibrillation: Secondary | ICD-10-CM | POA: Diagnosis not present

## 2014-09-22 DIAGNOSIS — I824Y9 Acute embolism and thrombosis of unspecified deep veins of unspecified proximal lower extremity: Secondary | ICD-10-CM | POA: Diagnosis not present

## 2014-09-22 DIAGNOSIS — Z5181 Encounter for therapeutic drug level monitoring: Secondary | ICD-10-CM

## 2014-09-22 LAB — POCT INR: INR: 2.1

## 2014-10-03 DIAGNOSIS — H1013 Acute atopic conjunctivitis, bilateral: Secondary | ICD-10-CM | POA: Diagnosis not present

## 2014-10-03 DIAGNOSIS — H2513 Age-related nuclear cataract, bilateral: Secondary | ICD-10-CM | POA: Diagnosis not present

## 2014-10-13 ENCOUNTER — Ambulatory Visit (INDEPENDENT_AMBULATORY_CARE_PROVIDER_SITE_OTHER): Payer: Medicare Other | Admitting: Surgery

## 2014-10-13 DIAGNOSIS — I4891 Unspecified atrial fibrillation: Secondary | ICD-10-CM

## 2014-10-13 DIAGNOSIS — I824Y9 Acute embolism and thrombosis of unspecified deep veins of unspecified proximal lower extremity: Secondary | ICD-10-CM | POA: Diagnosis not present

## 2014-10-13 DIAGNOSIS — Z5181 Encounter for therapeutic drug level monitoring: Secondary | ICD-10-CM

## 2014-10-13 DIAGNOSIS — I480 Paroxysmal atrial fibrillation: Secondary | ICD-10-CM

## 2014-10-13 LAB — POCT INR: INR: 2.7

## 2014-11-01 DIAGNOSIS — M109 Gout, unspecified: Secondary | ICD-10-CM | POA: Diagnosis not present

## 2014-11-01 DIAGNOSIS — N390 Urinary tract infection, site not specified: Secondary | ICD-10-CM | POA: Diagnosis not present

## 2014-11-01 DIAGNOSIS — N2581 Secondary hyperparathyroidism of renal origin: Secondary | ICD-10-CM | POA: Diagnosis not present

## 2014-11-01 DIAGNOSIS — D631 Anemia in chronic kidney disease: Secondary | ICD-10-CM | POA: Diagnosis not present

## 2014-11-01 DIAGNOSIS — N183 Chronic kidney disease, stage 3 (moderate): Secondary | ICD-10-CM | POA: Diagnosis not present

## 2014-11-01 DIAGNOSIS — M10079 Idiopathic gout, unspecified ankle and foot: Secondary | ICD-10-CM | POA: Diagnosis not present

## 2014-11-16 ENCOUNTER — Ambulatory Visit (INDEPENDENT_AMBULATORY_CARE_PROVIDER_SITE_OTHER): Payer: Medicare Other | Admitting: *Deleted

## 2014-11-16 DIAGNOSIS — I4891 Unspecified atrial fibrillation: Secondary | ICD-10-CM

## 2014-11-16 DIAGNOSIS — Z5181 Encounter for therapeutic drug level monitoring: Secondary | ICD-10-CM | POA: Diagnosis not present

## 2014-11-16 DIAGNOSIS — I480 Paroxysmal atrial fibrillation: Secondary | ICD-10-CM

## 2014-11-16 DIAGNOSIS — I824Y9 Acute embolism and thrombosis of unspecified deep veins of unspecified proximal lower extremity: Secondary | ICD-10-CM | POA: Diagnosis not present

## 2014-11-16 LAB — POCT INR: INR: 2.8

## 2014-11-30 ENCOUNTER — Other Ambulatory Visit: Payer: Self-pay

## 2014-11-30 ENCOUNTER — Ambulatory Visit (INDEPENDENT_AMBULATORY_CARE_PROVIDER_SITE_OTHER): Payer: Medicare Other | Admitting: Internal Medicine

## 2014-11-30 ENCOUNTER — Encounter: Payer: Self-pay | Admitting: Internal Medicine

## 2014-11-30 VITALS — BP 126/78 | HR 81 | Ht 74.5 in | Wt 279.6 lb

## 2014-11-30 DIAGNOSIS — I442 Atrioventricular block, complete: Secondary | ICD-10-CM

## 2014-11-30 DIAGNOSIS — I519 Heart disease, unspecified: Secondary | ICD-10-CM

## 2014-11-30 DIAGNOSIS — I429 Cardiomyopathy, unspecified: Secondary | ICD-10-CM | POA: Diagnosis not present

## 2014-11-30 DIAGNOSIS — I428 Other cardiomyopathies: Secondary | ICD-10-CM

## 2014-11-30 LAB — CUP PACEART INCLINIC DEVICE CHECK
Battery Remaining Longevity: 80 mo
Brady Statistic AP VS Percent: 0.14 %
Brady Statistic AS VP Percent: 16.83 %
Brady Statistic RA Percent Paced: 82.81 %
HIGH POWER IMPEDANCE MEASURED VALUE: 38 Ohm
HIGH POWER IMPEDANCE MEASURED VALUE: 60 Ohm
HighPow Impedance: 46 Ohm
Lead Channel Impedance Value: 399 Ohm
Lead Channel Impedance Value: 418 Ohm
Lead Channel Impedance Value: 513 Ohm
Lead Channel Impedance Value: 741 Ohm
Lead Channel Pacing Threshold Pulse Width: 0.4 ms
Lead Channel Pacing Threshold Pulse Width: 0.4 ms
Lead Channel Pacing Threshold Pulse Width: 0.4 ms
Lead Channel Setting Pacing Amplitude: 1.5 V
Lead Channel Setting Pacing Amplitude: 2.5 V
Lead Channel Setting Pacing Pulse Width: 0.4 ms
MDC IDC MSMT BATTERY VOLTAGE: 3.05 V
MDC IDC MSMT LEADCHNL LV PACING THRESHOLD AMPLITUDE: 0.5 V
MDC IDC MSMT LEADCHNL RA PACING THRESHOLD AMPLITUDE: 1 V
MDC IDC MSMT LEADCHNL RA SENSING INTR AMPL: 2 mV
MDC IDC MSMT LEADCHNL RV IMPEDANCE VALUE: 266 Ohm
MDC IDC MSMT LEADCHNL RV PACING THRESHOLD AMPLITUDE: 1.25 V
MDC IDC SESS DTM: 20160601132134
MDC IDC SET LEADCHNL LV PACING PULSEWIDTH: 0.5 ms
MDC IDC SET LEADCHNL RA PACING AMPLITUDE: 2.25 V
MDC IDC SET LEADCHNL RV SENSING SENSITIVITY: 0.3 mV
MDC IDC STAT BRADY AP VP PERCENT: 82.66 %
MDC IDC STAT BRADY AS VS PERCENT: 0.37 %
MDC IDC STAT BRADY RV PERCENT PACED: 96.89 %
Zone Setting Detection Interval: 300 ms
Zone Setting Detection Interval: 350 ms
Zone Setting Detection Interval: 400 ms
Zone Setting Detection Interval: 450 ms

## 2014-11-30 NOTE — Patient Instructions (Signed)
Medication Instructions:  Your physician recommends that you continue on your current medications as directed. Please refer to the Current Medication list given to you today.   Labwork: None ordered  Testing/Procedures: None ordered  Follow-Up:  Your physician wants you to follow-up in: 6 months with Chanetta Marshall, NP You will receive a reminder letter in the mail two months in advance. If you don't receive a letter, please call our office to schedule the follow-up appointment.  Remote monitoring is used to monitor your Pacemaker or ICD from home. This monitoring reduces the number of office visits required to check your device to one time per year. It allows [place] to keep an eye on the functioning of your device to ensure it is working properly. You are scheduled for a device check from home on 03/01/15. You may send your transmission at any time that day. If you have a wireless device, the transmission will be sent automatically. After your physician reviews your transmission, you will receive a postcard with your next transmission date.  ICM clinic with Alvis Lemmings, RN--- she will call you     Any Other Special Instructions Will Be Listed Below (If Applicable).

## 2014-12-01 NOTE — Progress Notes (Signed)
Electrophysiology Office Note   Date:  12/01/2014   ID:  Maurice Delgado, DOB January 09, 1934, MRN [id number]  PCP:  Barbette Merino, MD  Cardiologist:  Dr Tamala Julian Primary Electrophysiologist: Maxmillian Carsey   Chief Complaint  Patient presents with  . NICM  . Complete heart block     History of Present Illness: Maurice Delgado is a 81 y.o. male who presents today for electrophysiology evaluation.  He has done well since his recent ICD gen change. The patient has stable SOB.  He denies CP.    He has chronic renal failure and edema which is stable.  He is unaware of AF/ atach. Today, he denies symptoms of palpitations, chest pain,   claudication, dizziness, presyncope, syncope, bleeding, or neurologic sequela. The patient is tolerating medications without difficulties and is otherwise without complaint today.   Past Medical History  Diagnosis Date  . Nonischemic cardiomyopathy     a. EF 35%. b. s/p CRT-D device implanted - initially 2002 in [place], gen change 2011 (MDT).  . HTN (hypertension)   . DVT (deep venous thrombosis)     a. Recurrent DVTs. b. s/p IVC filter 2007. c. DVTs dx 05/2013 - started back on Coumadin  . Gout   . COPD (chronic obstructive pulmonary disease)   . Chronic systolic CHF (congestive heart failure)   . PAF (paroxysmal atrial fibrillation)   . GERD (gastroesophageal reflux disease)   . Left fibular fracture     a. 03/2013 - nonsurgical mgmt.  . Hemoptysis     a. H/o of hemoptysis with pulmonary fibrosis (patient has refused biopsies in the past).  . OSA on CPAP   . Chronic kidney disease (CKD), stage III (moderate)   . Chronic respiratory failure     a. On home O2 - 2-3L 24/7  . Retroperitoneal bleed     a. Remotely on Coumadin (per Dr. Jackalyn Lombard prior office note).  . 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  . Atrial tachycardia     a. s/p ablation 05/2013.   Past Surgical History  Procedure Laterality Date  . Cardiac defibrillator placement      initial BiV  ICD 09/22/00 in [place].  Gen change (MDT) by Dr Leonia Reeves 10/30/09  . Video bronchoscopy  02/26/2012    Procedure: VIDEO BRONCHOSCOPY WITHOUT FLUORO;  Surgeon: Kathee Delton, MD;  Location: [facility];  Service: Cardiopulmonary;  Laterality: Bilateral;  . Foot surgery Right     "removed a knot off top of my foot" (06/21/2013)  . Tonsillectomy  1940's  . Appendectomy  1940's    "I think so"  . Vena cava filter placement  2007    [name] 05/25/2013 (06/21/2013)  . Supraventricular tachycardia ablation N/A 06/25/2013    Procedure: SUPRAVENTRICULAR TACHYCARDIA ABLATION;  Surgeon: Evans Lance, MD;  Location: [facility] CATH LAB;  Service: Cardiovascular;  Laterality: N/A;  . Biv icd genertaor change out N/A 08/30/2014    Procedure: BIV ICD [place];  Surgeon: Thompson Grayer, MD;  Location: [facility] CATH LAB;  Service: Cardiovascular;  Laterality: N/A;     Current Outpatient Prescriptions  Medication Sig Dispense Refill  . Ascorbic Acid (VITAMIN C) 1000 MG tablet Take 1,000 mg by mouth every morning.     . budesonide-formoterol (SYMBICORT) 160-4.5 MCG/ACT inhaler Inhale 2 puffs into the lungs 2 (two) times daily. 3 Inhaler 1  . calcitRIOL (ROCALTROL) 0.5 MCG capsule Take 0.5 mcg by mouth every other day.     Marland Kitchen DIGITEK 125 MCG tablet  TAKE 1 TABLET EVERY MORNING (Patient taking differently: TAKE 1 TABLET BY MOUTH EVERY MORNING) 90 tablet 0  . Febuxostat (ULORIC) 80 MG TABS Take 80 mg by mouth every morning.     . furosemide (LASIX) 80 MG tablet Take 160 mg by mouth 2 (two) times daily.    Marland Kitchen levalbuterol (XOPENEX HFA) 45 MCG/ACT inhaler Inhale 2 puffs into the lungs every 6 (six) hours as needed for wheezing.    . Olopatadine HCl (PATADAY) 0.2 % SOLN Place 1 drop into both eyes every morning.    . pregabalin (LYRICA) 150 MG capsule Take 150 mg by mouth 2 (two) times daily.      . sotalol (BETAPACE) 160 MG tablet Take 1 tablet (160 mg total) by mouth daily. 90 tablet 1  . Tamsulosin HCl (FLOMAX) 0.4 MG CAPS  Take 0.4 mg by mouth every morning.     . tiotropium (SPIRIVA) 18 MCG inhalation capsule Place 1 capsule (18 mcg total) into inhaler and inhale every morning. 90 capsule 3  . warfarin (COUMADIN) 5 MG tablet Take as directed by coumadin clinic (Patient taking differently: 5-7.5 mg. Tuesday take 5 mg and every day besides Tuesday take 7.5 mg) 150 tablet 0   No current facility-administered medications for this visit.    Allergies:   Amiodarone   Social History:  The patient  reports that he has quit smoking. His smoking use included Cigarettes. He has a 45 pack-year smoking history. He has never used smokeless tobacco. He reports that he uses illicit drugs (Marijuana). He reports that he does not drink alcohol.   Family History:  The patient's family history includes Heart disease in his mother.    ROS:  Please see the history of present illness.    All other systems are reviewed and negative.    PHYSICAL EXAM: VS:  BP 126/78 mmHg  Pulse 81  Ht 6' 2.5" (1.892 m)  Wt 126.826 kg (279 lb 9.6 oz)  BMI 35.43 kg/m2 , BMI Body mass index is 35.43 kg/(m^2). GEN: Well nourished, well developed, in no acute distress HEENT: normal Neck: no JVD, carotid bruits, or masses Cardiac: RRR  Respiratory:  Decreased BS, prolonged expiratory phase, normal work of breathing GI: soft, nontender, nondistended, + BS MS: no deformity or atrophy Skin: warm and dry,  device pocket is well healed Neuro:  Strength and sensation are intact Psych: euthymic mood, full affect  EKG:  EKG is ordered today. The ekg ordered today shows sinus with BiV pacing   Device interrogation is reviewed today in detail.  See PaceArt for details.  Recent Labs: 08/25/2014: BUN 32*; Creatinine 1.60*; Hemoglobin 13.1; Platelets 140.0*; Potassium 4.2; Sodium 140  No results found for requested labs within last 365 days.     CrCl cannot be calculated (Patient has no serum creatinine result on file.).   Wt Readings from Last 3  Encounters:  11/30/14 126.826 kg (279 lb 9.6 oz)  08/09/14 123.832 kg (273 lb)  08/08/14 123.832 kg (273 lb)    ASSESSMENT AND PLAN:  1.  Nonischemic CM Chronic SOB Medical therapy is limited by renal failure.  Not on a beta blocker due to lung disease.  Not sure why he is not on Imdur/ hydralazine.  Will defer to Dr Tamala Julian. Salt restriction is advised Will follow in ICM device clinic  2. Atach/afib Appropriately anticoagulated Continue sotalol given prior AF  3. Complete heart block Normal BiV ICD function No changes today  4. CRI Stable No change  required today   Current medicines are reviewed at length with the patient today.  The patient is without any concerns regarding medicines and no changes are made today.    Disposition:  Carelink,  Return to see EP NP in 1 year,  Follow-up with Dr Tamala Julian as scheduled   Signed, Thompson Grayer MD  12/01/2014 11:55 PM      [facility] [address] [place] [address]  [phone] (office) [phone] (fax)

## 2014-12-05 DIAGNOSIS — I10 Essential (primary) hypertension: Secondary | ICD-10-CM | POA: Diagnosis not present

## 2014-12-05 DIAGNOSIS — N184 Chronic kidney disease, stage 4 (severe): Secondary | ICD-10-CM | POA: Diagnosis not present

## 2014-12-05 DIAGNOSIS — N390 Urinary tract infection, site not specified: Secondary | ICD-10-CM | POA: Diagnosis not present

## 2014-12-05 DIAGNOSIS — E119 Type 2 diabetes mellitus without complications: Secondary | ICD-10-CM | POA: Diagnosis not present

## 2014-12-14 ENCOUNTER — Ambulatory Visit (INDEPENDENT_AMBULATORY_CARE_PROVIDER_SITE_OTHER): Payer: Medicare Other | Admitting: *Deleted

## 2014-12-14 DIAGNOSIS — I480 Paroxysmal atrial fibrillation: Secondary | ICD-10-CM

## 2014-12-14 DIAGNOSIS — I4891 Unspecified atrial fibrillation: Secondary | ICD-10-CM | POA: Diagnosis not present

## 2014-12-14 DIAGNOSIS — Z5181 Encounter for therapeutic drug level monitoring: Secondary | ICD-10-CM | POA: Diagnosis not present

## 2014-12-14 DIAGNOSIS — I824Y9 Acute embolism and thrombosis of unspecified deep veins of unspecified proximal lower extremity: Secondary | ICD-10-CM

## 2014-12-14 LAB — POCT INR: INR: 2.7

## 2014-12-28 ENCOUNTER — Encounter: Payer: Self-pay | Admitting: Pulmonary Disease

## 2015-01-12 DIAGNOSIS — I10 Essential (primary) hypertension: Secondary | ICD-10-CM | POA: Diagnosis not present

## 2015-01-12 DIAGNOSIS — R05 Cough: Secondary | ICD-10-CM | POA: Diagnosis not present

## 2015-01-12 DIAGNOSIS — N184 Chronic kidney disease, stage 4 (severe): Secondary | ICD-10-CM | POA: Diagnosis not present

## 2015-01-12 DIAGNOSIS — E119 Type 2 diabetes mellitus without complications: Secondary | ICD-10-CM | POA: Diagnosis not present

## 2015-01-25 ENCOUNTER — Ambulatory Visit (INDEPENDENT_AMBULATORY_CARE_PROVIDER_SITE_OTHER): Payer: Medicare Other | Admitting: *Deleted

## 2015-01-25 DIAGNOSIS — I824Y9 Acute embolism and thrombosis of unspecified deep veins of unspecified proximal lower extremity: Secondary | ICD-10-CM

## 2015-01-25 DIAGNOSIS — Z5181 Encounter for therapeutic drug level monitoring: Secondary | ICD-10-CM

## 2015-01-25 DIAGNOSIS — I480 Paroxysmal atrial fibrillation: Secondary | ICD-10-CM

## 2015-01-25 DIAGNOSIS — I4891 Unspecified atrial fibrillation: Secondary | ICD-10-CM | POA: Diagnosis not present

## 2015-01-25 LAB — POCT INR: INR: 2.6

## 2015-02-06 ENCOUNTER — Ambulatory Visit: Payer: Medicare Other | Admitting: Pulmonary Disease

## 2015-02-28 ENCOUNTER — Telehealth: Payer: Self-pay | Admitting: *Deleted

## 2015-02-28 NOTE — Telephone Encounter (Signed)
Patient called with questions about Carelink transmissions and letter received regarding 6 month follow-up with Chanetta Marshall, NP.  Explained to patient that he should have his device remotely checked tomorrow as scheduled, and then schedule an appointment with Amber in November.  Explained process for remote follow-up and explained that if we do not receive his transmission automatically tomorrow, he will have to send a manual transmission.  Patient voices understanding and denies additional questions or concerns at this time.

## 2015-03-01 ENCOUNTER — Ambulatory Visit (INDEPENDENT_AMBULATORY_CARE_PROVIDER_SITE_OTHER): Payer: Medicare Other | Admitting: Pulmonary Disease

## 2015-03-01 ENCOUNTER — Encounter: Payer: Self-pay | Admitting: Pulmonary Disease

## 2015-03-01 ENCOUNTER — Ambulatory Visit (INDEPENDENT_AMBULATORY_CARE_PROVIDER_SITE_OTHER): Payer: Medicare Other | Admitting: *Deleted

## 2015-03-01 VITALS — BP 110/70 | HR 86 | Ht 74.0 in | Wt 276.8 lb

## 2015-03-01 DIAGNOSIS — I428 Other cardiomyopathies: Secondary | ICD-10-CM

## 2015-03-01 DIAGNOSIS — J9611 Chronic respiratory failure with hypoxia: Secondary | ICD-10-CM

## 2015-03-01 DIAGNOSIS — J432 Centrilobular emphysema: Secondary | ICD-10-CM

## 2015-03-01 DIAGNOSIS — I429 Cardiomyopathy, unspecified: Secondary | ICD-10-CM

## 2015-03-01 DIAGNOSIS — E669 Obesity, unspecified: Secondary | ICD-10-CM

## 2015-03-01 DIAGNOSIS — G4733 Obstructive sleep apnea (adult) (pediatric): Secondary | ICD-10-CM

## 2015-03-01 DIAGNOSIS — Z87891 Personal history of nicotine dependence: Secondary | ICD-10-CM

## 2015-03-01 DIAGNOSIS — I519 Heart disease, unspecified: Secondary | ICD-10-CM | POA: Diagnosis not present

## 2015-03-01 NOTE — Progress Notes (Signed)
Chief Complaint  Patient presents with  . Follow-up    Former [name] pt. Pt has not used his CPAP in a while bc he doesn't sleep very long and he couldn't breathe well when he used it. Pt is uses 2 liters O2 during the day and 3 liters with sleep. He reports he does feel SOB w/ exertion.     History of Present Illness: Maurice Delgado is a 81 y.o. male former smoker with OSA and COPD with emphysema.  He was previously followed by Dr. Gwenette Delgado.  He was using CPAP, but had trouble adjusting to pressure and mask.  He still has his old CPAP, but has not used in some time.  He sleeps in a recliner and uses 2 to 3 liters oxygen.  He has a portable oxygen concentrator, but also has a stationary concentrator that he was using only with CPAP.  His sleep is erratic.  He will got to bed at 8:30 to 9 pm.  He will wake up after few hours to do his evening prayers.  He goes back to sleep for about another 3 hours.  He then wakes up.  He will then nap in the afternoon for 1 to 2 hours, especially after eating lunch.  He is not having cough, wheeze, or sputum.  He gets winded with activity, but does okay when walking at steady pace.  He has spiriva and symbicort and feels these help.  He has a BiV ICD.  He has notices some discomfort in pocket in his Lt upper chest.   TESTS: PSG 08/11/05 >> RDI 33.8, SaO2 low 74% PFT 06/14/08 >> FEV1 2.24 (71%), FEV1% 51, TLC 7.62 (106%), DLCO 40%, no BD V/Q 05/25/13 >> very low prob PE Echo 08/15/14 >> EF 20 to 48%, grade 2 diastolic dysfx  PMhx >> Non ischemic CM, complete heart block s/p BiV ICD, Atrial fibrillation/Atrial tachycardia, HTN, recurrent DVT, Gout, CKD, Hypothyroidism  Past surgical hx, Medications, Allergies, Family hx, Social hx all reviewed.   Physical Exam: BP 110/70 mmHg  Pulse 86  Ht 6\' 2"  (1.88 m)  Wt 276 lb 12.8 oz (125.556 kg)  BMI 35.52 kg/m2  SpO2 90%  General - No distress ENT - No sinus tenderness, no oral exudate, no LAN, wears  dentures, MP 3 Cardiac - s1s2 regular, no murmur Chest - decreased BS, no wheeze/crackles Back - No focal tenderness Abd - Soft, non-tender Ext - No edema Neuro - Normal strength Skin - No rashes Psych - normal mood, and behavior   Assessment/Plan:  Obstructive sleep apnea. Discussion: He is willing to reconsider PAP therapy.  Explained that he could also have central apnea and hypoventilation/hypoxia on basis of CHF and COPD. Plan: - will arrange for in lab sleep study to further assess current status of his sleep disordered breathing - will then determine if he needs to resume CPAP, or transition to either BiPAP or ASV  COPD with emphysema. Plan: - continue spiriva, and symbicort with prn xopenex  Chronic respiratory failure with hypoxia. Plan: - continue 2 to 3 liters oxygen 24/7 >> will determine if his nocturnal oxygen needs require adjustment after review of his sleep study  Obesity. Plan: - discussed importance of maintaining his weight  Non ischemic cardiomyopathy, s/p ICD. Plan: - advised him to d/w Dr. Rayann Delgado about discomfort at his ICD site    Maurice Mires, MD [place] Pager:  [phone]

## 2015-03-01 NOTE — Progress Notes (Signed)
Remote ICD transmission.   

## 2015-03-01 NOTE — Patient Instructions (Signed)
Will schedule sleep study and call with results Follow up in 4 months

## 2015-03-03 ENCOUNTER — Telehealth: Payer: Self-pay | Admitting: Internal Medicine

## 2015-03-03 NOTE — Telephone Encounter (Signed)
New message       1. Has your device fired? no 2. Is you device beeping? No 3. Are you experiencing draining or swelling at device site? no  4. Are you calling to see if we received your device transmission? yes 5. Have you passed out? No Pt states that he does not think his device is "deep" enough because it is causing him discomfort.  Please advise

## 2015-03-03 NOTE — Telephone Encounter (Signed)
Spoke to patient about device area. Patient denies any discoloration, heat, or drainage coming from the site, but states that it is very uncomfortable. Patient to come into the office on 03/08/15 @ 1330 for evaluation. Patient voiced understanding.

## 2015-03-08 ENCOUNTER — Ambulatory Visit (INDEPENDENT_AMBULATORY_CARE_PROVIDER_SITE_OTHER): Payer: Medicare Other | Admitting: *Deleted

## 2015-03-08 DIAGNOSIS — Z5181 Encounter for therapeutic drug level monitoring: Secondary | ICD-10-CM | POA: Diagnosis not present

## 2015-03-08 DIAGNOSIS — I824Y9 Acute embolism and thrombosis of unspecified deep veins of unspecified proximal lower extremity: Secondary | ICD-10-CM

## 2015-03-08 DIAGNOSIS — Z95810 Presence of automatic (implantable) cardiac defibrillator: Secondary | ICD-10-CM

## 2015-03-08 DIAGNOSIS — I4891 Unspecified atrial fibrillation: Secondary | ICD-10-CM

## 2015-03-08 DIAGNOSIS — I480 Paroxysmal atrial fibrillation: Secondary | ICD-10-CM | POA: Diagnosis not present

## 2015-03-08 LAB — POCT INR: INR: 2.9

## 2015-03-08 NOTE — Progress Notes (Signed)
Pt to device clinic for wound check r/t sharp pains in left shoulder with specific movements. Device pocket without redness, swelling, drainage. Pt denies fever and chills. No vasculature visible over device pocket. Left radial pulse present. Pt advised that if pain is not tolerable to seek pain management from PCP. Pt agreeable with plan. ROV with Chanetta Marshall, NP in December.

## 2015-03-10 LAB — CUP PACEART REMOTE DEVICE CHECK
Brady Statistic AP VP Percent: 86.5 %
Brady Statistic AS VS Percent: 0.3 %
Date Time Interrogation Session: 20160909074501
HighPow Impedance: 47 Ohm
Lead Channel Impedance Value: 418 Ohm
Lead Channel Impedance Value: 532 Ohm
Lead Channel Pacing Threshold Amplitude: 1.125 V
Lead Channel Pacing Threshold Pulse Width: 0.4 ms
Lead Channel Pacing Threshold Pulse Width: 0.4 ms
Lead Channel Sensing Intrinsic Amplitude: 11.5 mV
Lead Channel Setting Pacing Amplitude: 2.25 V
Lead Channel Setting Pacing Amplitude: 2.5 V
Lead Channel Setting Pacing Pulse Width: 0.4 ms
Lead Channel Setting Pacing Pulse Width: 0.5 ms
Lead Channel Setting Sensing Sensitivity: 0.3 mV
MDC IDC MSMT LEADCHNL RA PACING THRESHOLD AMPLITUDE: 1 V
MDC IDC MSMT LEADCHNL RA SENSING INTR AMPL: 2.5 mV
MDC IDC MSMT LEADCHNL RV IMPEDANCE VALUE: 266 Ohm
MDC IDC SET LEADCHNL LV PACING AMPLITUDE: 1.5 V
MDC IDC SET ZONE DETECTION INTERVAL: 350 ms
MDC IDC STAT BRADY AP VS PERCENT: 0.1 %
MDC IDC STAT BRADY AS VP PERCENT: 13.1 %
Zone Setting Detection Interval: 300 ms
Zone Setting Detection Interval: 400 ms
Zone Setting Detection Interval: 450 ms

## 2015-03-15 ENCOUNTER — Telehealth: Payer: Self-pay | Admitting: Internal Medicine

## 2015-03-15 NOTE — Telephone Encounter (Signed)
New message      Talk to someone in device.  Pt said he needs to send a monitor back but the container he received is not big enough to send his carelink (I think) monitor.  Can he get a bigger container?  Please call

## 2015-03-15 NOTE — Telephone Encounter (Signed)
Forwarding to device clinic 

## 2015-03-15 NOTE — Telephone Encounter (Signed)
Forwarding to [facility] team to address.

## 2015-03-21 ENCOUNTER — Encounter: Payer: Self-pay | Admitting: Cardiology

## 2015-03-23 NOTE — Telephone Encounter (Signed)
Attempted to call pt. Phone continuously rings. No VM available.   Re: per tech svcs, any return kit size is sufficient.  

## 2015-03-24 NOTE — Telephone Encounter (Signed)
2nd attempt. Phone continuously rings. No VM available.

## 2015-03-27 DIAGNOSIS — N2581 Secondary hyperparathyroidism of renal origin: Secondary | ICD-10-CM | POA: Diagnosis not present

## 2015-03-27 DIAGNOSIS — D631 Anemia in chronic kidney disease: Secondary | ICD-10-CM | POA: Diagnosis not present

## 2015-03-27 DIAGNOSIS — Z23 Encounter for immunization: Secondary | ICD-10-CM | POA: Diagnosis not present

## 2015-03-27 DIAGNOSIS — N183 Chronic kidney disease, stage 3 (moderate): Secondary | ICD-10-CM | POA: Diagnosis not present

## 2015-03-27 DIAGNOSIS — Z0000 Encounter for general adult medical examination without abnormal findings: Secondary | ICD-10-CM | POA: Diagnosis not present

## 2015-03-31 ENCOUNTER — Telehealth: Payer: Self-pay

## 2015-03-31 NOTE — Telephone Encounter (Signed)
Attempted ICM enrollment call.  Patient referred by Dr Rayann Heman for remote ICM monitoring and no answer, no answering machine.

## 2015-04-05 ENCOUNTER — Encounter: Payer: Self-pay | Admitting: Internal Medicine

## 2015-04-07 NOTE — Telephone Encounter (Signed)
Attempted ICM enrollment call. Patient referred by Dr Rayann Heman for remote ICM monitoring and no answer, no answering machine.

## 2015-05-17 ENCOUNTER — Ambulatory Visit (HOSPITAL_BASED_OUTPATIENT_CLINIC_OR_DEPARTMENT_OTHER): Payer: Medicare Other | Attending: Pulmonary Disease | Admitting: Radiology

## 2015-05-17 VITALS — Ht 74.0 in | Wt 280.0 lb

## 2015-05-17 DIAGNOSIS — J449 Chronic obstructive pulmonary disease, unspecified: Secondary | ICD-10-CM | POA: Diagnosis not present

## 2015-05-17 DIAGNOSIS — I493 Ventricular premature depolarization: Secondary | ICD-10-CM | POA: Insufficient documentation

## 2015-05-17 DIAGNOSIS — I509 Heart failure, unspecified: Secondary | ICD-10-CM | POA: Insufficient documentation

## 2015-05-17 DIAGNOSIS — R0683 Snoring: Secondary | ICD-10-CM | POA: Diagnosis not present

## 2015-05-17 DIAGNOSIS — G4733 Obstructive sleep apnea (adult) (pediatric): Secondary | ICD-10-CM | POA: Insufficient documentation

## 2015-05-17 DIAGNOSIS — J9611 Chronic respiratory failure with hypoxia: Secondary | ICD-10-CM

## 2015-05-18 ENCOUNTER — Ambulatory Visit (INDEPENDENT_AMBULATORY_CARE_PROVIDER_SITE_OTHER): Payer: Medicare Other | Admitting: Surgery

## 2015-05-18 DIAGNOSIS — I4891 Unspecified atrial fibrillation: Secondary | ICD-10-CM

## 2015-05-18 DIAGNOSIS — I480 Paroxysmal atrial fibrillation: Secondary | ICD-10-CM | POA: Diagnosis not present

## 2015-05-18 DIAGNOSIS — Z5181 Encounter for therapeutic drug level monitoring: Secondary | ICD-10-CM | POA: Diagnosis not present

## 2015-05-18 DIAGNOSIS — I824Y9 Acute embolism and thrombosis of unspecified deep veins of unspecified proximal lower extremity: Secondary | ICD-10-CM

## 2015-05-18 LAB — POCT INR: INR: 2.1

## 2015-05-30 ENCOUNTER — Telehealth: Payer: Self-pay | Admitting: Pulmonary Disease

## 2015-05-30 DIAGNOSIS — G4733 Obstructive sleep apnea (adult) (pediatric): Secondary | ICD-10-CM | POA: Diagnosis not present

## 2015-05-30 NOTE — Telephone Encounter (Signed)
PSG 05/17/15 >> AHI 1, SpO2 low 96%.  Used 3 liters oxygen.  Will have my nurse inform pt that sleep study did not show significant sleep apnea.  He does not need to use CPAP.  He should continue 3 liters oxygen at night.

## 2015-05-30 NOTE — Progress Notes (Signed)
Patient Name: Maurice Delgado, Maurice Delgado Date: 05/17/2015 Gender: Male D.O.B: [birth year] Age (years): 81 Referring Provider: Chesley Mires MD, ABSM Height (inches): 74 Interpreting Physician: Chesley Mires MD, ABSM Weight (lbs): 280 RPSGT: Carolin Coy BMI: 36 MRN: [id number] Neck Size: 17.00  CLINICAL INFORMATION Sleep Study Type: NPSG Indication for sleep study: Congestive Heart Failure, COPD, OSA Epworth Sleepiness Score: 5  SLEEP STUDY TECHNIQUE As per the AASM Manual for the Scoring of Sleep and Associated Events v2.3 (April 2016) with a hypopnea requiring 4% desaturations. The channels recorded and monitored were frontal, central and occipital EEG, electrooculogram (EOG), submentalis EMG (chin), nasal and oral airflow, thoracic and abdominal wall motion, anterior tibialis EMG, snore microphone, electrocardiogram, and pulse oximetry.  MEDICATIONS Patient's medications include: reviewed in electronic medical record. Medications self-administered by patient during sleep study : No sleep medicine administered.  SLEEP ARCHITECTURE The study was initiated at 11:01:07 PM and ended at 5:05:18 AM. Sleep onset time was 0.4 minutes and the sleep efficiency was 99.0%. The total sleep time was 360.5 minutes. Stage REM latency was 66.0 minutes. The patient spent 5.13% of the night in stage N1 sleep, 79.89% in stage N2 sleep, 0.00% in stage N3 and 14.98% in REM. Alpha intrusion was absent. Supine sleep was 60.98%.  RESPIRATORY PARAMETERS The overall apnea/hypopnea index (AHI) was 1.0 per hour. There were 6 total apneas, including 6 obstructive, 0 central and 0 mixed apneas. There were 0 hypopneas and 28 RERAs. The AHI during Stage REM sleep was 5.6 per hour. AHI while supine was 0.3 per hour. The mean oxygen saturation was 98.67%. The minimum SpO2 during sleep was 96.00%. Soft snoring was noted during this study.  CARDIAC DATA The 2 lead EKG demonstrated sinus rhythm. The mean heart rate  was 69.68 beats per minute. Other EKG findings include: PVCs.  LEG MOVEMENT DATA The total PLMS were 0 with a resulting PLMS index of 0.00. Associated arousal with leg movement index was 0.0 .  IMPRESSIONS While he did have several apneic respiratory events these were not frequent enough to meet diagnosis of obstructive sleep apnea (AHI 1, SpO2 low 96%).  The study was conducted with him using his baseline 3 liters supplemental oxygen.  DIAGNOSIS - Snoring.  RECOMMENDATIONS Continue 3 liters supplemental oxygen at night.  Chesley Mires, MD, [place], American Board of Sleep Medicine 05/30/2015, 1:33 PM  NPI: [id number]

## 2015-05-31 NOTE — Telephone Encounter (Signed)
Contacted pt with results per SN Pt expressed understanding, nothing further needed

## 2015-06-08 ENCOUNTER — Encounter: Payer: Self-pay | Admitting: *Deleted

## 2015-06-14 ENCOUNTER — Telehealth: Payer: Self-pay | Admitting: Pulmonary Disease

## 2015-06-14 NOTE — Telephone Encounter (Signed)
Attempted to contact pt. No answer. Will try back. 

## 2015-06-15 MED ORDER — TIOTROPIUM BROMIDE MONOHYDRATE 18 MCG IN CAPS: 18.0000 ug | ORAL_CAPSULE | Freq: Every morning | RESPIRATORY_TRACT | Status: DC

## 2015-06-15 NOTE — Telephone Encounter (Signed)
Pt needs refill of his Spiriva (90-day) to [facility] - aware that this will be sent. Nothing further needed.

## 2015-06-15 NOTE — Telephone Encounter (Signed)
LM with male for pt to return call x1

## 2015-07-04 NOTE — Telephone Encounter (Signed)
Unable to reach for ICM clinic.  Patient has office appointment with Chanetta Marshall, NP on 07/06/2015 and will attempt to enroll in [facility] clinic at that time.

## 2015-07-05 NOTE — Progress Notes (Signed)
Electrophysiology Office Note Date: 07/06/2015  ID:  Maurice Delgado, DOB [birth year], MRN [id number]  PCP: Barbette Merino, MD Primary Cardiologist: Tamala Julian Electrophysiologist: [place]  CC: Routine ICD follow-up  Maurice Delgado is a 82 y.o. male seen today for Dr Rayann Heman.  He presents today for routine electrophysiology followup.  Since last being seen in our clinic, the patient reports that his dyspnea on exertion has worsened. His edema has also worsened. His weight is up about 15 pounds from previous visit. He denies chest pain, palpitations, nausea, vomiting, dizziness, syncope, edema, weight gain, or early satiety.  He has not had ICD shocks.   Device History: MDT CRTD implanted 2002 for NICM, CHF; gen change 2011 and 2016 History of appropriate therapy: No History of AAD therapy: Yes - Sotalol for AF   Past Medical History  Diagnosis Date  . Nonischemic cardiomyopathy (HCC)     a. EF 35%. b. s/p CRT-D device implanted - initially 2002 in [place], gen change 2011 (MDT).  . HTN (hypertension)   . DVT (deep venous thrombosis) ([place])     a. Recurrent DVTs. b. s/p IVC filter 2007. c. DVTs dx 05/2013 - started back on Coumadin  . Gout   . COPD (chronic obstructive pulmonary disease) ([place])   . Chronic systolic CHF (congestive heart failure) ([place])   . PAF (paroxysmal atrial fibrillation) ([place])   . GERD (gastroesophageal reflux disease)   . Left fibular fracture     a. 03/2013 - nonsurgical mgmt.  . Hemoptysis     a. H/o of hemoptysis with pulmonary fibrosis (patient has refused biopsies in the past).  . OSA on CPAP   . Chronic kidney disease (CKD), stage III (moderate)   . Chronic respiratory failure ([place])     a. On home O2 - 2-3L 24/7  . Retroperitoneal bleed     a. Remotely on Coumadin (per Dr. Jackalyn Lombard prior office note).  . 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  . Atrial tachycardia ([place])     a. s/p ablation 05/2013.   Past Surgical History  Procedure Laterality Date  .  Cardiac defibrillator placement      initial BiV ICD 09/22/00 in [place].  Gen change (MDT) by Dr Leonia Reeves 10/30/09  . Video bronchoscopy  02/26/2012    Procedure: VIDEO BRONCHOSCOPY WITHOUT FLUORO;  Surgeon: Kathee Delton, MD;  Location: [facility];  Service: Cardiopulmonary;  Laterality: Bilateral;  . Foot surgery Right     "removed a knot off top of my foot" (06/21/2013)  . Tonsillectomy  1940's  . Appendectomy  1940's    "I think so"  . Vena cava filter placement  2007    [name] 05/25/2013 (06/21/2013)  . Supraventricular tachycardia ablation N/A 06/25/2013    Procedure: SUPRAVENTRICULAR TACHYCARDIA ABLATION;  Surgeon: Evans Lance, MD;  Location: [facility] CATH LAB;  Service: Cardiovascular;  Laterality: N/A;  . Biv icd genertaor change out N/A 08/30/2014    Procedure: BIV ICD [place];  Surgeon: Thompson Grayer, MD;  Location: [facility] CATH LAB;  Service: Cardiovascular;  Laterality: N/A;    Current Outpatient Prescriptions  Medication Sig Dispense Refill  . Ascorbic Acid (VITAMIN C) 1000 MG tablet Take 1,000 mg by mouth every morning.     . budesonide-formoterol (SYMBICORT) 160-4.5 MCG/ACT inhaler Inhale 2 puffs into the lungs 2 (two) times daily. 3 Inhaler 1  . calcitRIOL (ROCALTROL) 0.5 MCG capsule Take 0.5 mcg by mouth every other day.     Marland Kitchen DIGITEK 125  MCG tablet TAKE 1 TABLET EVERY MORNING (Patient taking differently: TAKE 1 TABLET BY MOUTH EVERY MORNING) 90 tablet 0  . Febuxostat (ULORIC) 80 MG TABS Take 80 mg by mouth every morning.     . furosemide (LASIX) 80 MG tablet Take 160 mg by mouth 2 (two) times daily.    Marland Kitchen levalbuterol (XOPENEX HFA) 45 MCG/ACT inhaler Inhale 2 puffs into the lungs every 6 (six) hours as needed for wheezing.    . Olopatadine HCl (PATADAY) 0.2 % SOLN Place 1 drop into both eyes every morning.    . pregabalin (LYRICA) 150 MG capsule Take 150 mg by mouth 2 (two) times daily.      . sotalol (BETAPACE) 160 MG tablet Take 1 tablet (160 mg total) by mouth daily. 90  tablet 1  . Tamsulosin HCl (FLOMAX) 0.4 MG CAPS Take 0.4 mg by mouth every morning.     . tiotropium (SPIRIVA) 18 MCG inhalation capsule Place 1 capsule (18 mcg total) into inhaler and inhale every morning. 90 capsule 3  . warfarin (COUMADIN) 5 MG tablet Take as directed by coumadin clinic (Patient taking differently: 5-7.5 mg. Tuesday take 5 mg and every day besides Tuesday take 7.5 mg) 150 tablet 0   No current facility-administered medications for this visit.    Allergies:   Amiodarone   Social History: Social History   Social History  . Marital Status: Married    Spouse Name: N/A  . Number of Children: N/A  . Years of Education: N/A   Occupational History  . reitred     from education   Social History Main Topics  . Smoking status: Former Smoker -- 1.50 packs/day for 30 years    Types: Cigarettes  . Smokeless tobacco: Never Used     Comment: 06/21/2013 "quit smoking ~ 1986"  . Alcohol Use: No     Comment: 06/21/2013 "quit drinking ~ 1970"  . Drug Use: Yes    Special: Marijuana     Comment: 06/21/2013 "stopped using marijuana ~ 1970"  . Sexual Activity: Not Currently   Other Topics Concern  . Not on file   Social History Narrative   Married and has 1 son.    Lives in [place].  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    Family History: Family History  Problem Relation Age of Onset  . Heart disease Mother     mother also had Rheumatism.  Marland Kitchen Heart attack Neg Hx   . Stroke Neg Hx   . Hypertension Mother     Review of Systems: All other systems reviewed and are otherwise negative except as noted above.   Physical Exam: VS:  BP 110/70 mmHg  Pulse 96  Ht 6\' 2"  (1.88 m)  Wt 293 lb (132.904 kg)  BMI 37.60 kg/m2 , BMI Body mass index is 37.6 kg/(m^2).  GEN- The patient is elderly and dyspneic appearing (after walking to exam room), alert and oriented x 3 today.   HEENT: normocephalic, atraumatic; sclera clear, conjunctiva pink; hearing intact; oropharynx clear; neck  supple  Lungs- normal work of breathing, diminished breath sounds throughout, on continuous home O2 Heart- Regular rate and rhythm (paced) GI- soft, non-tender, non-distended, bowel sounds present  Extremities- no clubbing, cyanosis, 3+BLE edema MS- no significant deformity or atrophy Skin- warm and dry, no rash or lesion; ICD pocket well healed Psych- euthymic mood, full affect Neuro- strength and sensation are intact  ICD interrogation- reviewed in detail today,  See PACEART report  EKG:  EKG  is ordered today. The ekg ordered today shows AV pacing, stable QTc  Recent Labs: 08/25/2014: BUN 32*; Creatinine, Ser 1.60*; Hemoglobin 13.1; Platelets 140.0*; Potassium 4.2; Sodium 140   Wt Readings from Last 3 Encounters:  07/06/15 293 lb (132.904 kg)  05/17/15 280 lb (127.007 kg)  03/01/15 276 lb 12.8 oz (125.556 kg)     Other studies Reviewed: Additional studies/ records that were reviewed today include: Dr Jackalyn Lombard office notes  Assessment and Plan:  1.  Acute on chronic systolic dysfunction The patient is volume overloaded on exam today.  His weight is up 15 pounds since last office visit. He has had worsening dyspnea on exertion as well as LE edema. With elevated baseline creatinine, I think that admission for diuresis and evaluation by HF team is most appropriate at this time. Discussed with Dr Tamala Julian who is agreeable. HF team notified.  Medical regimen - limited by renal failure.  No BB 2/2 lung disease Normal ICD function See Pace Art report No changes today  2.  Atrial tachycardia/persistent atrial fibrillation Continue Sotalol - predominately maintaining SR by device interrogation today Continue Warfarin for CHADS2VASC of 4   3.  Complete heart block Normal device function No changes today  4.  [facility] Dr Jimmy Footman is nephrologist and has been managing diuretic dosing, but not seen recently   Disposition:   Admit to hospital today for acute on chronic systolic heart  failure and AHF team consultation - hospital will call patient when bed available (he would like to go home first so that his wife can drive him to hospital) - pt aware.   Signed, Chanetta Marshall, NP 07/06/2015 9:34 AM  [address] [place] [address] [phone] (office) [phone] (fax)

## 2015-07-06 ENCOUNTER — Encounter: Payer: Self-pay | Admitting: Nurse Practitioner

## 2015-07-06 ENCOUNTER — Other Ambulatory Visit: Payer: Self-pay | Admitting: Nurse Practitioner

## 2015-07-06 ENCOUNTER — Ambulatory Visit (INDEPENDENT_AMBULATORY_CARE_PROVIDER_SITE_OTHER): Payer: Medicare Other | Admitting: Nurse Practitioner

## 2015-07-06 ENCOUNTER — Inpatient Hospital Stay (HOSPITAL_COMMUNITY): Payer: Medicare Other

## 2015-07-06 ENCOUNTER — Encounter: Payer: Self-pay | Admitting: Internal Medicine

## 2015-07-06 ENCOUNTER — Inpatient Hospital Stay (HOSPITAL_COMMUNITY)
Admission: AD | Admit: 2015-07-06 | Discharge: 2015-07-10 | DRG: 291 | Disposition: A | Discharge status: Home Health | Payer: Medicare Other | Source: Ambulatory Visit | Attending: Cardiology | Admitting: Cardiology

## 2015-07-06 ENCOUNTER — Ambulatory Visit (INDEPENDENT_AMBULATORY_CARE_PROVIDER_SITE_OTHER): Payer: Medicare Other | Admitting: *Deleted

## 2015-07-06 VITALS — BP 110/70 | HR 96 | Ht 74.0 in | Wt 293.0 lb

## 2015-07-06 DIAGNOSIS — J8410 Pulmonary fibrosis, unspecified: Secondary | ICD-10-CM | POA: Diagnosis present

## 2015-07-06 DIAGNOSIS — I481 Persistent atrial fibrillation: Secondary | ICD-10-CM | POA: Diagnosis not present

## 2015-07-06 DIAGNOSIS — I428 Other cardiomyopathies: Secondary | ICD-10-CM

## 2015-07-06 DIAGNOSIS — E669 Obesity, unspecified: Secondary | ICD-10-CM | POA: Diagnosis present

## 2015-07-06 DIAGNOSIS — I5021 Acute systolic (congestive) heart failure: Secondary | ICD-10-CM | POA: Diagnosis not present

## 2015-07-06 DIAGNOSIS — N183 Chronic kidney disease, stage 3 unspecified: Secondary | ICD-10-CM | POA: Diagnosis present

## 2015-07-06 DIAGNOSIS — I4819 Other persistent atrial fibrillation: Secondary | ICD-10-CM

## 2015-07-06 DIAGNOSIS — Z9981 Dependence on supplemental oxygen: Secondary | ICD-10-CM | POA: Diagnosis not present

## 2015-07-06 DIAGNOSIS — Z6835 Body mass index (BMI) 35.0-35.9, adult: Secondary | ICD-10-CM | POA: Diagnosis not present

## 2015-07-06 DIAGNOSIS — R42 Dizziness and giddiness: Secondary | ICD-10-CM | POA: Diagnosis not present

## 2015-07-06 DIAGNOSIS — J9610 Chronic respiratory failure, unspecified whether with hypoxia or hypercapnia: Secondary | ICD-10-CM | POA: Diagnosis present

## 2015-07-06 DIAGNOSIS — Z5181 Encounter for therapeutic drug level monitoring: Secondary | ICD-10-CM

## 2015-07-06 DIAGNOSIS — J449 Chronic obstructive pulmonary disease, unspecified: Secondary | ICD-10-CM | POA: Diagnosis present

## 2015-07-06 DIAGNOSIS — J441 Chronic obstructive pulmonary disease with (acute) exacerbation: Secondary | ICD-10-CM | POA: Diagnosis present

## 2015-07-06 DIAGNOSIS — Z95810 Presence of automatic (implantable) cardiac defibrillator: Secondary | ICD-10-CM | POA: Diagnosis not present

## 2015-07-06 DIAGNOSIS — I442 Atrioventricular block, complete: Secondary | ICD-10-CM | POA: Diagnosis not present

## 2015-07-06 DIAGNOSIS — Z8249 Family history of ischemic heart disease and other diseases of the circulatory system: Secondary | ICD-10-CM | POA: Diagnosis not present

## 2015-07-06 DIAGNOSIS — M109 Gout, unspecified: Secondary | ICD-10-CM | POA: Diagnosis present

## 2015-07-06 DIAGNOSIS — I480 Paroxysmal atrial fibrillation: Secondary | ICD-10-CM | POA: Diagnosis present

## 2015-07-06 DIAGNOSIS — Z888 Allergy status to other drugs, medicaments and biological substances status: Secondary | ICD-10-CM | POA: Diagnosis not present

## 2015-07-06 DIAGNOSIS — I824Y9 Acute embolism and thrombosis of unspecified deep veins of unspecified proximal lower extremity: Secondary | ICD-10-CM

## 2015-07-06 DIAGNOSIS — I130 Hypertensive heart and chronic kidney disease with heart failure and stage 1 through stage 4 chronic kidney disease, or unspecified chronic kidney disease: Principal | ICD-10-CM | POA: Diagnosis present

## 2015-07-06 DIAGNOSIS — G4733 Obstructive sleep apnea (adult) (pediatric): Secondary | ICD-10-CM | POA: Diagnosis present

## 2015-07-06 DIAGNOSIS — I5023 Acute on chronic systolic (congestive) heart failure: Secondary | ICD-10-CM

## 2015-07-06 DIAGNOSIS — I5022 Chronic systolic (congestive) heart failure: Secondary | ICD-10-CM

## 2015-07-06 DIAGNOSIS — I471 Supraventricular tachycardia: Secondary | ICD-10-CM

## 2015-07-06 DIAGNOSIS — I5043 Acute on chronic combined systolic (congestive) and diastolic (congestive) heart failure: Secondary | ICD-10-CM | POA: Diagnosis not present

## 2015-07-06 DIAGNOSIS — I482 Chronic atrial fibrillation: Secondary | ICD-10-CM | POA: Diagnosis not present

## 2015-07-06 DIAGNOSIS — Z7902 Long term (current) use of antithrombotics/antiplatelets: Secondary | ICD-10-CM

## 2015-07-06 DIAGNOSIS — Z86718 Personal history of other venous thrombosis and embolism: Secondary | ICD-10-CM | POA: Diagnosis not present

## 2015-07-06 DIAGNOSIS — K219 Gastro-esophageal reflux disease without esophagitis: Secondary | ICD-10-CM | POA: Diagnosis present

## 2015-07-06 DIAGNOSIS — I509 Heart failure, unspecified: Secondary | ICD-10-CM | POA: Diagnosis not present

## 2015-07-06 DIAGNOSIS — Z7901 Long term (current) use of anticoagulants: Secondary | ICD-10-CM

## 2015-07-06 DIAGNOSIS — Z87891 Personal history of nicotine dependence: Secondary | ICD-10-CM | POA: Diagnosis not present

## 2015-07-06 DIAGNOSIS — I5189 Other ill-defined heart diseases: Secondary | ICD-10-CM | POA: Diagnosis present

## 2015-07-06 DIAGNOSIS — Z95828 Presence of other vascular implants and grafts: Secondary | ICD-10-CM

## 2015-07-06 DIAGNOSIS — R0602 Shortness of breath: Secondary | ICD-10-CM

## 2015-07-06 DIAGNOSIS — I4719 Other supraventricular tachycardia: Secondary | ICD-10-CM | POA: Diagnosis present

## 2015-07-06 LAB — CBC WITH DIFFERENTIAL/PLATELET
BASOS ABS: 0 10*3/uL (ref 0.0–0.1)
Basophils Relative: 0 %
Eosinophils Absolute: 0.1 10*3/uL (ref 0.0–0.7)
Eosinophils Relative: 2 %
HEMATOCRIT: 40.1 % (ref 39.0–52.0)
Hemoglobin: 12.3 g/dL — ABNORMAL LOW (ref 13.0–17.0)
LYMPHS ABS: 1.1 10*3/uL (ref 0.7–4.0)
LYMPHS PCT: 23 %
MCH: 27 pg (ref 26.0–34.0)
MCHC: 30.7 g/dL (ref 30.0–36.0)
MCV: 88.1 fL (ref 78.0–100.0)
MONO ABS: 0.4 10*3/uL (ref 0.1–1.0)
MONOS PCT: 9 %
NEUTROS ABS: 3.2 10*3/uL (ref 1.7–7.7)
Neutrophils Relative %: 66 %
Platelets: 120 10*3/uL — ABNORMAL LOW (ref 150–400)
RBC: 4.55 MIL/uL (ref 4.22–5.81)
RDW: 16.1 % — AB (ref 11.5–15.5)
WBC: 4.8 10*3/uL (ref 4.0–10.5)

## 2015-07-06 LAB — COMPREHENSIVE METABOLIC PANEL
ALT: 13 U/L — ABNORMAL LOW (ref 17–63)
AST: 13 U/L — AB (ref 15–41)
Albumin: 3.1 g/dL — ABNORMAL LOW (ref 3.5–5.0)
Alkaline Phosphatase: 76 U/L (ref 38–126)
Anion gap: 7 (ref 5–15)
BILIRUBIN TOTAL: 0.8 mg/dL (ref 0.3–1.2)
BUN: 30 mg/dL — AB (ref 6–20)
CO2: 34 mmol/L — ABNORMAL HIGH (ref 22–32)
Calcium: 9.7 mg/dL (ref 8.9–10.3)
Chloride: 100 mmol/L — ABNORMAL LOW (ref 101–111)
Creatinine, Ser: 1.87 mg/dL — ABNORMAL HIGH (ref 0.61–1.24)
GFR calc Af Amer: 38 mL/min — ABNORMAL LOW (ref >60)
GFR, EST NON AFRICAN AMERICAN: 33 mL/min — AB (ref >60)
Glucose, Bld: 95 mg/dL (ref 65–99)
POTASSIUM: 4.4 mmol/L (ref 3.5–5.1)
Sodium: 141 mmol/L (ref 135–145)
TOTAL PROTEIN: 6.4 g/dL — AB (ref 6.5–8.1)

## 2015-07-06 LAB — MRSA PCR SCREENING: MRSA by PCR: NEGATIVE

## 2015-07-06 LAB — MAGNESIUM: Magnesium: 2.1 mg/dL (ref 1.7–2.4)

## 2015-07-06 LAB — DIGOXIN LEVEL: DIGOXIN LVL: 0.5 ng/mL — AB (ref 0.8–2.0)

## 2015-07-06 LAB — POCT INR: INR: 2.7

## 2015-07-06 LAB — BRAIN NATRIURETIC PEPTIDE: B Natriuretic Peptide: 183.1 pg/mL — ABNORMAL HIGH (ref 0.0–100.0)

## 2015-07-06 MED ORDER — FUROSEMIDE 10 MG/ML IJ SOLN: 80.0000 mg | Freq: Two times a day (BID) | INTRAMUSCULAR | Status: DC

## 2015-07-06 MED ORDER — VITAMIN C 500 MG PO TABS
1000.0000 mg | ORAL_TABLET | Freq: Every morning | ORAL | Status: DC
  Administered 2015-07-07 – 2015-07-10 (×4): 1000 mg via ORAL
  Filled 2015-07-06 (×4): qty 2

## 2015-07-06 MED ORDER — OLOPATADINE HCL 0.1 % OP SOLN
1.0000 [drp] | Freq: Every day | OPHTHALMIC | Status: DC
  Administered 2015-07-07 – 2015-07-10 (×4): 1 [drp] via OPHTHALMIC
  Filled 2015-07-06 (×2): qty 5

## 2015-07-06 MED ORDER — SOTALOL HCL 80 MG PO TABS
160.0000 mg | ORAL_TABLET | Freq: Every day | ORAL | Status: DC
  Administered 2015-07-06 – 2015-07-09 (×4): 160 mg via ORAL
  Filled 2015-07-06 (×4): qty 2

## 2015-07-06 MED ORDER — DIGOXIN 125 MCG PO TABS
125.0000 ug | ORAL_TABLET | Freq: Every morning | ORAL | Status: DC
  Administered 2015-07-07 – 2015-07-10 (×4): 125 ug via ORAL
  Filled 2015-07-06 (×4): qty 1

## 2015-07-06 MED ORDER — AZITHROMYCIN 250 MG PO TABS: 250.0000 mg | ORAL_TABLET | Freq: Every day | ORAL | Status: DC

## 2015-07-06 MED ORDER — LEVALBUTEROL TARTRATE 45 MCG/ACT IN AERO: 2.0000 | INHALATION_SPRAY | Freq: Four times a day (QID) | RESPIRATORY_TRACT | Status: DC | PRN

## 2015-07-06 MED ORDER — TAMSULOSIN HCL 0.4 MG PO CAPS
0.4000 mg | ORAL_CAPSULE | Freq: Every morning | ORAL | Status: DC
  Administered 2015-07-07 – 2015-07-10 (×4): 0.4 mg via ORAL
  Filled 2015-07-06 (×4): qty 1

## 2015-07-06 MED ORDER — SODIUM CHLORIDE 0.9 % IJ SOLN: 3.0000 mL | INTRAMUSCULAR | Status: DC | PRN

## 2015-07-06 MED ORDER — WARFARIN SODIUM 5 MG PO TABS: 5.0000 mg | ORAL_TABLET | ORAL | Status: DC

## 2015-07-06 MED ORDER — SODIUM CHLORIDE 0.9 % IV SOLN: 250.0000 mL | INTRAVENOUS | Status: DC | PRN

## 2015-07-06 MED ORDER — SODIUM CHLORIDE 0.9 % IJ SOLN
3.0000 mL | Freq: Two times a day (BID) | INTRAMUSCULAR | Status: DC
  Administered 2015-07-06 – 2015-07-10 (×8): 3 mL via INTRAVENOUS

## 2015-07-06 MED ORDER — WARFARIN SODIUM 7.5 MG PO TABS
7.5000 mg | ORAL_TABLET | ORAL | Status: DC
  Administered 2015-07-06 – 2015-07-09 (×4): 7.5 mg via ORAL
  Filled 2015-07-06 (×4): qty 1

## 2015-07-06 MED ORDER — FEBUXOSTAT 40 MG PO TABS
80.0000 mg | ORAL_TABLET | Freq: Every morning | ORAL | Status: DC
  Administered 2015-07-07 – 2015-07-10 (×4): 80 mg via ORAL
  Filled 2015-07-06 (×4): qty 2

## 2015-07-06 MED ORDER — ISOSORB DINITRATE-HYDRALAZINE 20-37.5 MG PO TABS
1.0000 | ORAL_TABLET | Freq: Three times a day (TID) | ORAL | Status: DC
  Administered 2015-07-06 (×2): 1 via ORAL
  Filled 2015-07-06 (×3): qty 1

## 2015-07-06 MED ORDER — CALCITRIOL 0.5 MCG PO CAPS
0.5000 ug | ORAL_CAPSULE | ORAL | Status: DC
  Administered 2015-07-08 – 2015-07-10 (×2): 0.5 ug via ORAL
  Filled 2015-07-06 (×2): qty 1

## 2015-07-06 MED ORDER — ALBUTEROL SULFATE (2.5 MG/3ML) 0.083% IN NEBU: 2.5000 mg | INHALATION_SOLUTION | Freq: Four times a day (QID) | RESPIRATORY_TRACT | Status: DC | PRN

## 2015-07-06 MED ORDER — FUROSEMIDE 10 MG/ML IJ SOLN
80.0000 mg | Freq: Three times a day (TID) | INTRAMUSCULAR | Status: DC
  Administered 2015-07-06 – 2015-07-09 (×9): 80 mg via INTRAVENOUS
  Filled 2015-07-06 (×10): qty 8

## 2015-07-06 MED ORDER — ACETAMINOPHEN 325 MG PO TABS: 650.0000 mg | ORAL_TABLET | ORAL | Status: DC | PRN

## 2015-07-06 MED ORDER — ONDANSETRON HCL 4 MG/2ML IJ SOLN: 4.0000 mg | Freq: Four times a day (QID) | INTRAMUSCULAR | Status: DC | PRN

## 2015-07-06 MED ORDER — WARFARIN - PHARMACIST DOSING INPATIENT
Freq: Every day | Status: DC
  Administered 2015-07-07: 18:00:00

## 2015-07-06 MED ORDER — DOXYCYCLINE HYCLATE 100 MG PO TABS
100.0000 mg | ORAL_TABLET | Freq: Two times a day (BID) | ORAL | Status: DC
Start: 2015-07-06 — End: 2015-07-10
  Administered 2015-07-06 – 2015-07-10 (×8): 100 mg via ORAL
  Filled 2015-07-06 (×8): qty 1

## 2015-07-06 MED ORDER — PREGABALIN 75 MG PO CAPS
150.0000 mg | ORAL_CAPSULE | Freq: Two times a day (BID) | ORAL | Status: DC
  Administered 2015-07-06 – 2015-07-10 (×8): 150 mg via ORAL
  Filled 2015-07-06 (×8): qty 2

## 2015-07-06 MED ORDER — TIOTROPIUM BROMIDE MONOHYDRATE 18 MCG IN CAPS
18.0000 ug | ORAL_CAPSULE | Freq: Every day | RESPIRATORY_TRACT | Status: DC
  Administered 2015-07-07 – 2015-07-10 (×4): 18 ug via RESPIRATORY_TRACT
  Filled 2015-07-06: qty 5

## 2015-07-06 MED ORDER — BUDESONIDE-FORMOTEROL FUMARATE 160-4.5 MCG/ACT IN AERO
2.0000 | INHALATION_SPRAY | Freq: Two times a day (BID) | RESPIRATORY_TRACT | Status: DC
  Administered 2015-07-06 – 2015-07-10 (×8): 2 via RESPIRATORY_TRACT
  Filled 2015-07-06 (×2): qty 6

## 2015-07-06 NOTE — Progress Notes (Signed)
  Echocardiogram 2D Echocardiogram has been performed.  Maurice Delgado 07/06/2015, 5:12 PM

## 2015-07-06 NOTE — Patient Instructions (Signed)
Medication Instructions:   CONTINUE SAME MEDICATIONS  If you need a refill on your cardiac medications before your next appointment, please call your pharmacy.  Labwork: NONE ORDER TODAY   Testing/Procedures: NONE ORDER TODAY   Follow-Up:  YOUR BEING RECOMMENDED TO BE ADMITTED TODAY SOME ONE WILL CONTACT YOU WHEN BED IS AVAILABLE   Any Other Special Instructions Will Be Listed Below (If Applicable).

## 2015-07-06 NOTE — H&P (Signed)
Advanced Heart Failure Team History and Physical Note   Primary Physician:  Barbette Merino, MD Primary Cardiologist:  Dr Tamala Julian Primary EP: Dr Rayann Heman Primary HF: (New)  Reason for Admission: A/C systolic HF   HPI:    Maurice Delgado is a 82 y.o. male with history of chronic combined HF, Echo 08/2014 EF 20-25% due to NICM, CHB s/p MDT CRT-D 2002 with gen change 2011, HTN, Hx of DVT on chronic anticoag with coumadin, COPD, PAF, CKD stage III and hx of OSA who presented to EP clinic this am for visit with 15 lb weight gain and worsening DOE. Pt 296 lbs today, previous baseline in 270s. Most recent blood work taken 08/2014  He denies any CP, N/V, near-syncope, or ICD shocks. Is unsure what brought this on. Has been taking all of his medicines. Has had a cough for the past week or two. His PCP prescribed him something for it, but he is unsure what.  Feels like chest congestion has cleared up.  He estimates 50-60 oz of fluid intake daily, and does not monitor his salt intake, often adding salt with a salt shaker.  Baseline weights at home have been 270, but he is unsure of chronicity. Sleeps downstairs in a recline, chronically. Thinks he can lay flat, but sleeps upright for comfort.  Thinks he can walk from end of unit to Nursing station, but would have to stop and catch his breath. No SOB at rest. Says his legs have been swollen for "as long as he can remember". He is on chronic 02 at home. States CPAP d/c'd after recent sleep study showing he did not have OSA any longer. Of note, he was placed on a zpac by his PCP yesterday for COPD exacerbation. He brought the pills with him, but only has 3 250 mg tabs in his coat. Coughing up yellow/white sputum.   SH: Former Smoker - stopped in 2s. ~ 30-40 pack year history. Denies heavy ETOH use  FH: States his mother had high blood pressure and he thinks CAD, but passed away from Cancer.  Denies Cardiac history on fathers side.   Review of Systems: [y] = yes, [ ]   = no   General: Weight gain [y]; Weight loss [ ] ; Anorexia [ ] ; Fatigue [ ] ; Fever [ ] ; Chills [ ] ; Weakness [ ]   Cardiac: Chest pain/pressure [ ] ; Resting SOB [ ] ; Exertional SOB [y]; Orthopnea [ ] ; Pedal Edema [y]; Palpitations [ ] ; Syncope [ ] ; Presyncope [ ] ; Paroxysmal nocturnal dyspnea[ ]   Pulmonary: Cough [y]; Wheezing[ ] ; Hemoptysis[ ] ; Sputum [y]; Snoring [ ]   GI: Vomiting[ ] ; Dysphagia[ ] ; Melena[ ] ; Hematochezia [ ] ; Heartburn[ ] ; Abdominal pain [ ] ; Constipation [ ] ; Diarrhea [ ] ; BRBPR [ ]   GU: Hematuria[ ] ; Dysuria [ ] ; Nocturia[ ]   Vascular: Pain in legs with walking [ ] ; Pain in feet with lying flat [ ] ; Non-healing sores [ ] ; Stroke [ ] ; TIA [ ] ; Slurred speech [ ] ;  Neuro: Headaches[ ] ; Vertigo[ ] ; Seizures[ ] ; Paresthesias[ ] ;Blurred vision [ ] ; Diplopia [ ] ; Vision changes [ ]   Ortho/Skin: Arthritis [y]; Joint pain [y]; Muscle pain [ ] ; Joint swelling [ ] ; Back Pain [ ] ; Rash [ ]   Psych: Depression[ ] ; Anxiety[ ]   Heme: Bleeding problems [ ] ; Clotting disorders [ ] ; Anemia [ ]   Endocrine: Diabetes [ ] ; Thyroid dysfunction[ ]    Home Medications Prior to Admission medications   Medication Sig Start Date End Date Taking? Authorizing Provider  Ascorbic Acid (VITAMIN C) 1000 MG tablet Take 1,000 mg by mouth every morning.     Historical Provider, MD  budesonide-formoterol (SYMBICORT) 160-4.5 MCG/ACT inhaler Inhale 2 puffs into the lungs 2 (two) times daily. 07/14/13   Kathee Delton, MD  calcitRIOL (ROCALTROL) 0.5 MCG capsule Take 0.5 mcg by mouth every other day.     Historical Provider, MD  DIGITEK 125 MCG tablet TAKE 1 TABLET EVERY MORNING Patient taking differently: TAKE 1 TABLET BY MOUTH EVERY MORNING 05/12/14   Belva Crome, MD  Febuxostat (ULORIC) 80 MG TABS Take 80 mg by mouth every morning.     Historical Provider, MD  furosemide (LASIX) 80 MG tablet Take 160 mg by mouth 2 (two) times daily.    Historical Provider, MD  levalbuterol [facility] HFA) 45 MCG/ACT inhaler  Inhale 2 puffs into the lungs every 6 (six) hours as needed for wheezing.    Historical Provider, MD  Olopatadine HCl (PATADAY) 0.2 % SOLN Place 1 drop into both eyes every morning.    Historical Provider, MD  pregabalin (LYRICA) 150 MG capsule Take 150 mg by mouth 2 (two) times daily.      Historical Provider, MD  sotalol (BETAPACE) 160 MG tablet Take 1 tablet (160 mg total) by mouth daily. 02/23/14   Belva Crome, MD  Tamsulosin HCl (FLOMAX) 0.4 MG CAPS Take 0.4 mg by mouth every morning.     Historical Provider, MD  tiotropium (SPIRIVA) 18 MCG inhalation capsule Place 1 capsule (18 mcg total) into inhaler and inhale every morning. 06/15/15   Chesley Mires, MD  warfarin (COUMADIN) 5 MG tablet Take as directed by coumadin clinic Patient taking differently: 5-7.5 mg. Tuesday take 5 mg and every day besides Tuesday take 7.5 mg 05/18/14   Belva Crome, MD    Past Medical History: Past Medical History  Diagnosis Date  . Nonischemic cardiomyopathy (HCC)     a. EF 35%. b. s/p CRT-D device implanted - initially 2002 in [place], gen change 2011 (MDT).  . HTN (hypertension)   . DVT (deep venous thrombosis) ([place])     a. Recurrent DVTs. b. s/p IVC filter 2007. c. DVTs dx 05/2013 - started back on Coumadin  . Gout   . COPD (chronic obstructive pulmonary disease) ([place])   . Chronic systolic CHF (congestive heart failure) ([place])   . PAF (paroxysmal atrial fibrillation) ([place])   . GERD (gastroesophageal reflux disease)   . Left fibular fracture     a. 03/2013 - nonsurgical mgmt.  . Hemoptysis     a. H/o of hemoptysis with pulmonary fibrosis (patient has refused biopsies in the past).  . OSA on CPAP   . Chronic kidney disease (CKD), stage III (moderate)   . Chronic respiratory failure ([place])     a. On home O2 - 2-3L 24/7  . Retroperitoneal bleed     a. Remotely on Coumadin (per Dr. Jackalyn Lombard prior office note).  . 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  . Atrial tachycardia ([place])     a. s/p ablation  05/2013.    Past Surgical History: Past Surgical History  Procedure Laterality Date  . Cardiac defibrillator placement      initial BiV ICD 09/22/00 in [place].  Gen change (MDT) by Dr Leonia Reeves 10/30/09  . Video bronchoscopy  02/26/2012    Procedure: VIDEO BRONCHOSCOPY WITHOUT FLUORO;  Surgeon: Kathee Delton, MD;  Location: [facility];  Service: Cardiopulmonary;  Laterality: Bilateral;  . Foot surgery  Right     "removed a knot off top of my foot" (06/21/2013)  . Tonsillectomy  1940's  . Appendectomy  1940's    "I think so"  . Vena cava filter placement  2007    [name] 05/25/2013 (06/21/2013)  . Supraventricular tachycardia ablation N/A 06/25/2013    Procedure: SUPRAVENTRICULAR TACHYCARDIA ABLATION;  Surgeon: Evans Lance, MD;  Location: [facility] CATH LAB;  Service: Cardiovascular;  Laterality: N/A;  . Biv icd genertaor change out N/A 08/30/2014    Procedure: BIV ICD [place];  Surgeon: Thompson Grayer, MD;  Location: [facility] CATH LAB;  Service: Cardiovascular;  Laterality: N/A;    Family History:  Family History  Problem Relation Age of Onset  . Heart disease Mother     mother also had Rheumatism.  Marland Kitchen Heart attack Neg Hx   . Stroke Neg Hx   . Hypertension Mother     Social History: Social History   Social History  . Marital Status: Married    Spouse Name: N/A  . Number of Children: N/A  . Years of Education: N/A   Occupational History  . reitred     from education   Social History Main Topics  . Smoking status: Former Smoker -- 1.50 packs/day for 30 years    Types: Cigarettes  . Smokeless tobacco: Never Used     Comment: 06/21/2013 "quit smoking ~ 1986"  . Alcohol Use: No     Comment: 06/21/2013 "quit drinking ~ 1970"  . Drug Use: Yes    Special: Marijuana     Comment: 06/21/2013 "stopped using marijuana ~ 1970"  . Sexual Activity: Not Currently   Other Topics Concern  . Not on file   Social History Narrative   Married and has 1 son.    Lives in [place].   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    Allergies:  Allergies  Allergen Reactions  . Amiodarone     hyperthyroidism    Objective:    Vital Signs:   Temp:  [97.5 F (36.4 C)] 97.5 F (36.4 C) (01/05 1548) Pulse Rate:  [80] 80 (01/05 1548) Resp:  [18] 18 (01/05 1548) BP: (114)/(84) 114/84 mmHg (01/05 1548) SpO2:  [96 %] 96 % (01/05 1548) Weight:  [282 lb 11.2 oz (128.232 kg)] 282 lb 11.2 oz (128.232 kg) (01/05 1548) Last BM Date: 07/06/15 Filed Weights   07/06/15 1548  Weight: 282 lb 11.2 oz (128.232 kg)    Physical Exam: General:  Elderly appearing. NAD HEENT: normal Neck: supple. JVD 10 cm+ with HJR. Carotids 2+ bilat; no bruits. No thyromegaly or nodule noted. Cor: PMI nondisplaced. Regular. No rubs, gallops or murmurs appreciated. Lungs: Diminished bases with very slight crackles.  Abdomen: Obese, soft, NT, ND, no HSM. No bruits or masses. +BS  Extremities: no cyanosis, clubbing, rash. Chronic woody edema bilateral LEs. Wound LLE. ?1+ edema into thighs. Neuro: alert & oriented x 3, cranial nerves grossly intact. moves all 4 extremities w/o difficulty. Affect pleasant.  Telemetry: reviewed personally, av sequential pacing in 70s, underlying afib.   Labs: Basic Metabolic Panel: No results for input(s): NA, K, CL, CO2, GLUCOSE, BUN, CREATININE, CALCIUM, MG, PHOS in the last 168 hours.  Liver Function Tests: No results for input(s): AST, ALT, ALKPHOS, BILITOT, PROT, ALBUMIN in the last 168 hours. No results for input(s): LIPASE, AMYLASE in the last 168 hours. No results for input(s): AMMONIA in the last 168 hours.  CBC: No results for input(s): WBC, NEUTROABS, HGB, HCT, MCV, PLT  in the last 168 hours.  Cardiac Enzymes: No results for input(s): CKTOTAL, CKMB, CKMBINDEX, TROPONINI in the last 168 hours.  BNP: BNP (last 3 results) No results for input(s): BNP in the last 8760 hours.  ProBNP (last 3 results) No results for input(s): PROBNP in the last 8760  hours.   CBG: No results for input(s): GLUCAP in the last 168 hours.  Coagulation Studies:  Recent Labs  07/06/15 0936  INR 2.7    Other results: EKG: 07/06/15 AV sequential pacing, 72 bpm  Imaging: No results found.   Assessment/Plan   1. Acute on chronic combined HF, Echo 2/16 20-25%, Grade 2 DD, Moderate LAE and RAE. NYHA Class III- Early IIIb symptoms. - Up ~ 15 from recent baseline weights. Agree with admission for IV diuresis and further evaluation. - He has been on 160 mg lasix BID chronically. Will start with 80 mg IV lasix BID. May need to switch to torsemide for home.  - Repeat Echo ordered. - Not on HF meds. Will start Bidil 1 tab TID.  - Strict I/Os and daily weights. Fluid restriction < 1800 mL. - PA/Lateral CXR pending.  2. Persistent Afib - Continue sotalol - Continue coumadin, Pharmacy to dose. - CHADS2VASC of 4 3. CKD stage III - Last labs in system 08/2014.  - Follow closely with diuresis. - Previously seen by Dr Deterding 4. Hx of DVT -Continue coumadin 5. COPD, exacerbation - He was placed on Azithromycin by his PCP yesterday with increase sputum.  - Should continue ABX to completion.  - CXR pending.   Length of Stay: 0   Shirley Friar PA-C 07/06/2015, 4:21 PM  Advanced Heart Failure Team Pager [phone] (M-F; 7a - 4p)  Please contact [facility] for night-coverage after hours (4p -7a ) and weekends on [email or web address]  Patient seen with PA, agree with the above note.   1. Acute on chronic systolic CHF: EF 0000000 in 2/16.  He has been on a high dose of Lasix at home but has gained weight and developed NYHA class IIIb-IV symptoms, dyspneic walking around his house. Exam is a bit difficult for volume but he does appear volume up. - Lasix 80 mg IV every 8 hrs and follow response (on Lasix 160 mg bid at home). - Start Bidil 1 tab tid. - Continue digoxin but check level.  2. Atrial fibrillation: Currently a-paced.  Paroxysmal fibrillation.   Continue warfarin and sotalol, paced QTc ok today on ECG.  3. CKD: Stage III. On high dose Lasix at home.  Watch creatinine closely, BMET now.  4. H/o DVT: Has IVC filter, on warfarin.  5. COPD: ?mild exacerbation.  PCP started azithromycin.  With use of sotalol, would stop this and use doxycycline instead for 7 days abx.   Loralie Champagne 07/06/2015 5:02 PM

## 2015-07-06 NOTE — Progress Notes (Signed)
ANTICOAGULATION CONSULT NOTE - Initial Consult  Pharmacy Consult for warfarin  Indication: atrial fibrillation, hx DVT  Allergies  Allergen Reactions  . Amiodarone     hyperthyroidism    Patient Measurements: Height: 6\' 2"  (188 cm) Weight: 282 lb 11.2 oz (128.232 kg) IBW/kg (Calculated) : 82.2   Vital Signs: Temp: 97.5 F (36.4 C) (01/05 1548) Temp Source: Oral (01/05 1548) BP: 114/84 mmHg (01/05 1548) Pulse Rate: 80 (01/05 1548)  Labs:  Recent Labs  07/06/15 0936  INR 2.7    CrCl cannot be calculated (Patient has no serum creatinine result on file.).   Medical History: Past Medical History  Diagnosis Date  . Nonischemic cardiomyopathy (HCC)     a. EF 35%. b. s/p CRT-D device implanted - initially 2002 in [place], gen change 2011 (MDT).  . HTN (hypertension)   . DVT (deep venous thrombosis) ([place])     a. Recurrent DVTs. b. s/p IVC filter 2007. c. DVTs dx 05/2013 - started back on Coumadin  . Gout   . COPD (chronic obstructive pulmonary disease) ([place])   . Chronic systolic CHF (congestive heart failure) ([place])   . PAF (paroxysmal atrial fibrillation) ([place])   . GERD (gastroesophageal reflux disease)   . Left fibular fracture     a. 03/2013 - nonsurgical mgmt.  . Hemoptysis     a. H/o of hemoptysis with pulmonary fibrosis (patient has refused biopsies in the past).  . OSA on CPAP   . Chronic kidney disease (CKD), stage III (moderate)   . Chronic respiratory failure ([place])     a. On home O2 - 2-3L 24/7  . Retroperitoneal bleed     a. Remotely on Coumadin (per Dr. Jackalyn Lombard prior office note).  . 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  . Atrial tachycardia ([place])     a. s/p ablation 05/2013.   Assessment: 82 year old male with PAF on chronic comadin seen in EP office for routine ICD follow up found to have volume overload.   INR was 2.7 at office this am. PTA warfarin dose was 7.5mg  daily except 5mg  on Tuesday. Will continue with home dose for now.  Goal of  Therapy:  INR 2-3 Monitor platelets by anticoagulation protocol: Yes   Plan:  Continue home dose of warfarin 7.5mg  daily except 5mg  on Tuesdays Daily INR x2 more days then 3x a week if stable  Erin Hearing PharmD., BCPS Clinical Pharmacist Pager [phone] 07/06/2015 4:24 PM

## 2015-07-07 ENCOUNTER — Encounter (HOSPITAL_COMMUNITY): Payer: Self-pay | Admitting: Cardiology

## 2015-07-07 ENCOUNTER — Inpatient Hospital Stay (HOSPITAL_COMMUNITY): Payer: Medicare Other

## 2015-07-07 DIAGNOSIS — I482 Chronic atrial fibrillation: Secondary | ICD-10-CM

## 2015-07-07 LAB — BASIC METABOLIC PANEL
Anion gap: 9 (ref 5–15)
BUN: 31 mg/dL — ABNORMAL HIGH (ref 6–20)
CALCIUM: 9.3 mg/dL (ref 8.9–10.3)
CO2: 29 mmol/L (ref 22–32)
CREATININE: 1.83 mg/dL — AB (ref 0.61–1.24)
Chloride: 105 mmol/L (ref 101–111)
GFR, EST AFRICAN AMERICAN: 39 mL/min — AB (ref >60)
GFR, EST NON AFRICAN AMERICAN: 33 mL/min — AB (ref >60)
Glucose, Bld: 84 mg/dL (ref 65–99)
Potassium: 5.1 mmol/L (ref 3.5–5.1)
Sodium: 143 mmol/L (ref 135–145)

## 2015-07-07 LAB — PROTIME-INR
INR: 2.75 — AB (ref 0.00–1.49)
PROTHROMBIN TIME: 28.7 s — AB (ref 11.6–15.2)

## 2015-07-07 MED ORDER — ISOSORB DINITRATE-HYDRALAZINE 20-37.5 MG PO TABS
0.5000 | ORAL_TABLET | Freq: Three times a day (TID) | ORAL | Status: DC
  Administered 2015-07-07 – 2015-07-10 (×9): 0.5 via ORAL
  Filled 2015-07-07 (×9): qty 1

## 2015-07-07 NOTE — Progress Notes (Signed)
Pt BP is low this morning (88/52). Pt is asymptomatic. Notified PA and will hold Bidil until 10:30 am and recheck BP before administering. Will continue to monitor.

## 2015-07-07 NOTE — Progress Notes (Signed)
Spoke with RN, BP 88/52.  Asymptomatic.  Will decrease Bidil to 0.5 tab TID and monitor BP closely.   Legrand Como [address] [place], PA-C 07/07/2015 9:33 AM

## 2015-07-07 NOTE — Care Management Note (Signed)
Case Management Note  Patient Details  Name: Maurice Delgado MRN: [id number] Date of Birth: Oct 13, 1933  Subjective/Objective:        Admitted with Acute CHF            Action/Plan: Patient lives at home with spouse, has private insurance with Medicare / BCBS with prescription drug coverage; pharmacy of choice [facility]. Patient states that he does not have any problems getting his medication. He has 2 walkers at home that he does not use. CM encouraged patient that he might think about using them to prevent falling. Patient could benefit from a Disease Management Program for CHF. HHC choice offered, patient chose [facility]. Mary with [facility] made aware of referral.  Expected Discharge Date:  07/10/15               Expected Discharge Plan:  Lupus  Discharge planning Services  CM Consult  Choice offered to:  Patient  HH Arranged:  RN, Disease Management, PT [place] Agency:  [facility]  Status of Service:  In process, will continue to follow  Sherrilyn Rist [phone] 07/07/2015, 4:02 PM

## 2015-07-07 NOTE — Evaluation (Signed)
Physical Therapy Evaluation Patient Details Name: Maurice Delgado MRN: [id number] DOB: [birth year] Today's Date: 07/07/2015   History of Present Illness  82 yo male with onset of CHF and increased LE edema, has PMHx:  chronic O2 use, gout, AICD, CKD 3, DVT  Clinical Impression  Pt was seen for follow up to admission to get up and maneuver with eye on safety and control.  He is not able to walk alone and will be home alone, and therefore will need to either have 24/7 assist or be admitted to SNF.  Has full flight of steps in his house and may not be able to go unless he progresses with therapy.    Follow Up Recommendations Home health PT;Supervision/Assistance - 24 hour    Equipment Recommendations  Rolling walker with 5" wheels    Recommendations for Other Services Rehab consult     Precautions / Restrictions Precautions Precautions: Fall;ICD/Pacemaker Restrictions Weight Bearing Restrictions: No      Mobility  Bed Mobility Overal bed mobility:  (up when PT entered)                Transfers Overall transfer level: Needs assistance Equipment used: Rolling walker (2 wheeled);1 person hand held assist Transfers: Sit to/from [facility] Sit to Stand: Min guard;Min assist Stand pivot transfers: Min assist;Min guard       General transfer comment: reminders about hand placement  Ambulation/Gait Ambulation/Gait assistance: Min guard;Min assist Ambulation Distance (Feet): 200 Feet (with 2 stops to assess O2) Assistive device: Rolling walker (2 wheeled);2 person hand held assist Gait Pattern/deviations: Step-through pattern;Wide base of support;Drifts right/left;Trunk flexed Gait velocity: reduced Gait velocity interpretation: Below normal speed for age/gender General Gait Details: pt tries to go at a rapid pace but is not safe to fully attempt this  Stairs            Wheelchair Mobility    Modified Rankin (Stroke Patients Only)        Balance Overall balance assessment: Needs assistance Sitting-balance support: Feet supported Sitting balance-Leahy Scale: Good   Postural control: Posterior lean Standing balance support: Bilateral upper extremity supported Standing balance-Leahy Scale: Fair Standing balance comment: fair- dynamic balance                             Pertinent Vitals/Pain Pain Assessment: No/denies pain    Home Living Family/patient expects to be discharged to:: Private residence Living Arrangements: Non-relatives/Friends;Alone (not clear what availability is) Available Help at Discharge: Other (Comment);Friend(s) (unclear what the availability is.) Type of Home: House Home Access: Level entry     Home Layout: Two level Home Equipment: Cane - single point      Prior Function Level of Independence: Independent with assistive device(s)               Hand Dominance        Extremity/Trunk Assessment   Upper Extremity Assessment: Overall WFL for tasks assessed           Lower Extremity Assessment: Generalized weakness      Cervical / Trunk Assessment: Normal  Communication   Communication: No difficulties  Cognition Arousal/Alertness: Awake/alert Behavior During Therapy: WFL for tasks assessed/performed Overall Cognitive Status: No family/caregiver present to determine baseline cognitive functioning                      General Comments General comments (skin integrity, edema, etc.): Pt is wanting to go  directly home but does not have assistance and will need some direct help for gait and transfers barring a recovery in hospital    Exercises        Assessment/Plan    PT Assessment Patient needs continued PT services  PT Diagnosis Difficulty walking   PT Problem List Decreased strength;Decreased range of motion;Decreased activity tolerance;Decreased balance;Decreased coordination;Decreased mobility;Decreased knowledge of use of DME;Decreased  cognition;Decreased safety awareness;Decreased knowledge of precautions;Cardiopulmonary status limiting activity;Obesity  PT Treatment Interventions DME instruction;Gait training;Stair training;Functional mobility training;Therapeutic activities;Therapeutic exercise;Balance training;Neuromuscular re-education;Patient/family education   PT Goals (Current goals can be found in the Care Plan section) Acute Rehab PT Goals Patient Stated Goal: to get home soon PT Goal Formulation: With patient Time For Goal Achievement: 07/21/15 Potential to Achieve Goals: Good    Frequency Min 2X/week   Barriers to discharge Inaccessible home environment;Decreased caregiver support has flight of steps with one rail and O2 sats are dropping    Co-evaluation               End of Session Equipment Utilized During Treatment: Gait belt;Oxygen Activity Tolerance: Patient tolerated treatment well;Treatment limited secondary to medical complications (Comment);Other (comment) (O2 sats dropped with gait) Patient left: in chair;with call bell/phone within reach;with chair alarm set Nurse Communication: Mobility status;Other (comment) (O2 sats dropping)         Time: [id number] PT Time Calculation (min) (ACUTE ONLY): 30 min   Charges:   PT Evaluation $PT Eval Moderate Complexity: 1 Procedure PT Treatments $Gait Training: 8-22 mins   PT G Codes:        Ramond Dial 2015-07-21, 2:08 PM   Mee Hives, PT MS Acute Rehab Dept. Number: [phone] [phone] and [place] [phone]

## 2015-07-07 NOTE — Progress Notes (Signed)
Advanced Heart Failure Rounding Note  Primary Physician: Barbette Merino, MD Primary Cardiologist: Dr Tamala Julian Primary EP: Dr Rayann Heman Primary HF: (New)  Subjective:    Has been peeing a lot.  Breathing feels better. No CP. Has been walking around his room without difficulty.   Out 1.5 L x 24 hrs on IV lasix 80 mg TID (only got one dose yesterady.  Weight shows down 6 lbs? Creatinine stable  Echo 07/06/15 LVEF 20-25% with grade 2 DD. Mild MR.   Objective:   Weight Range: 276 lb 14.4 oz (125.601 kg) Body mass index is 35.54 kg/(m^2).   Vital Signs:   Temp:  [97.5 F (36.4 C)-97.9 F (36.6 C)] 97.9 F (36.6 C) (01/06 0615) Pulse Rate:  [69-80] 71 (01/06 0615) Resp:  [18-20] 18 (01/06 0615) BP: (109-140)/(56-100) 109/56 mmHg (01/06 0615) SpO2:  [94 %-100 %] 94 % (01/06 0615) Weight:  [276 lb 14.4 oz (125.601 kg)-282 lb 11.2 oz (128.232 kg)] 276 lb 14.4 oz (125.601 kg) (01/06 0615) Last BM Date: 07/06/15  Weight change: Filed Weights   07/06/15 1548 07/07/15 0615  Weight: 282 lb 11.2 oz (128.232 kg) 276 lb 14.4 oz (125.601 kg)    Intake/Output:   Intake/Output Summary (Last 24 hours) at 07/07/15 0729 Last data filed at 07/07/15 [id number]  Gross per 24 hour  Intake    480 ml  Output   2050 ml  Net  -1570 ml     Physical Exam: General: Elderly appearing. NAD HEENT: normal Neck: supple. Difficult but appears to have JVD 8-9 cm+ with HJR. Carotids 2+ bilat; no bruits. No thyromegaly or nodule noted. Cor: PMI nondisplaced. Regular, hear sounds slightly distant. No M/G/R appreciated. Lungs: Diminished bases with very slight crackles.  Abdomen: Obese, soft, NT, ND, no HSM. No bruits or masses. +BS  Extremities: no cyanosis, clubbing, rash. Chronic woody edema bilateral LEs but improved from yesterday. Wound LLE.  Neuro: alert & oriented x 3, cranial nerves grossly intact. moves all 4 extremities w/o difficulty. Affect flat.  Telemetry: reviewed personally, av pacing in  80s.  Labs: CBC  Recent Labs  07/06/15 1719  WBC 4.8  NEUTROABS 3.2  HGB 12.3*  HCT 40.1  MCV 88.1  PLT 123456*   Basic Metabolic Panel  Recent Labs  07/06/15 1719 07/07/15 0235  NA 141 143  K 4.4 5.1  CL 100* 105  CO2 34* 29  GLUCOSE 95 84  BUN 30* 31*  CREATININE 1.87* 1.83*  CALCIUM 9.7 9.3  MG 2.1  --    Liver Function Tests  Recent Labs  07/06/15 1719  AST 13*  ALT 13*  ALKPHOS 76  BILITOT 0.8  PROT 6.4*  ALBUMIN 3.1*   No results for input(s): LIPASE, AMYLASE in the last 72 hours. Cardiac Enzymes No results for input(s): CKTOTAL, CKMB, CKMBINDEX, TROPONINI in the last 72 hours.  BNP: BNP (last 3 results)  Recent Labs  07/06/15 1719  BNP 183.1*    Imaging/Studies:   No results found.  Latest Echo  Latest Cath   Medications:     Scheduled Medications: . budesonide-formoterol  2 puff Inhalation BID  . [START ON 07/08/2015] calcitRIOL  0.5 mcg Oral QODAY  . digoxin  125 mcg Oral q morning - 10a  . doxycycline  100 mg Oral Q12H  . febuxostat  80 mg Oral q morning - 10a  . furosemide  80 mg Intravenous Q8H  . isosorbide-hydrALAZINE  1 tablet Oral TID  . olopatadine  1 drop Both Eyes  Daily  . pregabalin  150 mg Oral BID  . sodium chloride  3 mL Intravenous Q12H  . sotalol  160 mg Oral QHS  . tamsulosin  0.4 mg Oral q morning - 10a  . tiotropium  18 mcg Inhalation Daily  . vitamin C  1,000 mg Oral q morning - 10a  . warfarin  7.5 mg Oral Once per day on Sun Mon Wed Thu Fri Sat   And  . [START ON 07/11/2015] warfarin  5 mg Oral Once per day on Tue  . Warfarin - Pharmacist Dosing Inpatient   Does not apply q1800     Infusions:     PRN Medications:  sodium chloride, acetaminophen, albuterol, ondansetron (ZOFRAN) IV, sodium chloride   Assessment/Plan   Maurice Delgado is a 82 y.o. male with history of chronic combined HF, Echo 08/2014 EF 20-25% due to NICM, CHB s/p MDT CRT-D 2002 with gen change 2011, HTN, Hx of DVT on chronic  anticoag with coumadin, COPD, PAF, CKD stage III and hx of OSA admitted after he presented to EP clinic visit with 15 lb weight gain and worsening DOE. HF asked to see/admit with A/C systolic CHF.   1. Acute on chronic combined CHF: Echo 07/06/15 20-25% - Repeat echo yesterday relatively unchanged from previous.  - NYHA class IIIb-IV symptoms - Volume status remains elevated, but improving.   - Continue Lasix 80 mg IV q8 hrs. May be able to switch to po as early as tomorrow.  - Continue Bidil 1 tab tid. BP soft. No room to uptitrate.  - Continue digoxin. Level 0.5 07/06/15 - Will have PT see.  - To get CXR this AM.  2. Atrial fibrillation, paroxysmal:  - Currently a-paced.  - Continue warfarin and sotalol. QTc 490 on ECG.07/06/15 3. CKD: Stage III - Stable on IV lasix. - Continue to follow closely with diuresis.   4. H/o DVT s/p IVC filter - Continue warfarin.  - INR 2.75 today.  5. COPD: - Started Azithromycin 07/05/15 for ?mild exacerbation per PCP.  - Switched to doxycycline this admission with use of sotalol, to complete 7 day course of ABX.  - As above, CXR this am.   Length of Stay: 1   Maurice Friar PA-C 07/07/2015, 7:29 AM  Advanced Heart Failure Team Pager [phone] (M-F; 7a - 4p)  Please contact [facility] for night-coverage after hours (4p -7a ) and weekends on [email or web address]  Patient seen with PA, agree with the above note.  Weight down, diuresed some overnight. Denies dyspnea at rest.  Creatinine stable.  Continue IV diuresis today, reassess tomorrow (may be able to transition to po depending on response => he does not appear markedly volume overloaded today).  Continue Bidil.  Digoxin level ok.  He was on Lasix 160 mg po bid, would transition to torsemide at discharge, could use 80 mg po bid.   Loralie Champagne 07/07/2015 8:38 AM

## 2015-07-07 NOTE — Progress Notes (Signed)
[place] for Warfarin  Indication: atrial fibrillation, hx DVT  Allergies  Allergen Reactions  . Amiodarone     hyperthyroidism    Patient Measurements: Height: 6\' 2"  (188 cm) Weight: 276 lb 14.4 oz (125.601 kg) (a scale) IBW/kg (Calculated) : 82.2   Vital Signs: Temp: 97.9 F (36.6 C) (01/06 0615) Temp Source: Oral (01/06 0615) BP: 109/56 mmHg (01/06 0615) Pulse Rate: 71 (01/06 0615)  Labs:  Recent Labs  07/06/15 0936 07/06/15 1719 07/07/15 0235  HGB  --  12.3*  --   HCT  --  40.1  --   PLT  --  120*  --   LABPROT  --   --  28.7*  INR 2.7  --  2.75*  CREATININE  --  1.87* 1.83*    Estimated Creatinine Clearance: 46.1 mL/min (by C-G formula based on Cr of 1.83).  Assessment: 82 year old male with PAF on chronic comadin seen in EP office for routine ICD follow up found to have volume overload.   INR was 2.7 at office this am. PTA warfarin dose was 7.5mg  daily except 5mg  on Tuesday. Will continue with home dose for now.  INR still therapeutic this AM  Goal of Therapy:  INR 2-3 Monitor platelets by anticoagulation protocol: Yes   Plan:  Continue home dose of warfarin 7.5mg  daily except 5mg  on Tuesdays Daily INR x2 more days then 3x a week if stable  Thank you Anette Guarneri, PharmD [phone] 07/07/2015 8:57 AM

## 2015-07-08 LAB — BASIC METABOLIC PANEL
Anion gap: 7 (ref 5–15)
BUN: 31 mg/dL — AB (ref 6–20)
CHLORIDE: 101 mmol/L (ref 101–111)
CO2: 32 mmol/L (ref 22–32)
Calcium: 9.1 mg/dL (ref 8.9–10.3)
Creatinine, Ser: 1.83 mg/dL — ABNORMAL HIGH (ref 0.61–1.24)
GFR calc Af Amer: 39 mL/min — ABNORMAL LOW (ref >60)
GFR calc non Af Amer: 33 mL/min — ABNORMAL LOW (ref >60)
GLUCOSE: 126 mg/dL — AB (ref 65–99)
POTASSIUM: 3.7 mmol/L (ref 3.5–5.1)
Sodium: 140 mmol/L (ref 135–145)

## 2015-07-08 LAB — PROTIME-INR
INR: 2.79 — ABNORMAL HIGH (ref 0.00–1.49)
Prothrombin Time: 29 seconds — ABNORMAL HIGH (ref 11.6–15.2)

## 2015-07-08 NOTE — Progress Notes (Signed)
[facility] Del[facility] for Warfarin  Indication: atrial fibrillation, hx DVT  Allergies  Allergen Reactions  . Amiodarone     hyperthyroidism    Patient Measurements: Height: 6\' 2"  (188 cm) Weight: 276 lb 14.4 oz (125.6 kg) (scale a) IBW/kg (Calculated) : 82.2   Vital Signs: Temp: 98 F (36.7 C) (01/07 0456) Temp Source: Oral (01/07 0456) BP: 103/64 mmHg (01/07 0841) Pulse Rate: 71 (01/07 0456)  Labs:  Recent Labs  07/06/15 0936 07/06/15 1719 07/07/15 0235 07/08/15 0334  HGB  --  12.3*  --   --   HCT  --  40.1  --   --   PLT  --  120*  --   --   LABPROT  --   --  28.7* 29.0*  INR 2.7  --  2.75* 2.79*  CREATININE  --  1.87* 1.83* 1.83*    Estimated Creatinine Clearance: 46.1 mL/min (by C-G formula based on Cr of 1.83).  Assessment: 82 year old male with PAF on chronic comadin seen in EP office for routine ICD follow up found to have volume overload.   INR was 2.7 at office this am. PTA warfarin dose was 7.5mg  daily except 5mg  on Tuesday. Will continue with home dose for now.  INR this AM = 2.79, CBC stable on 1/6  Goal of Therapy:  INR 2-3 Monitor platelets by anticoagulation protocol: Yes   Plan:  Continue home dose of warfarin 7.5mg  daily except 5mg  on Tuesdays Daily INR x1 more day1 then 3x a week if stable  Lashala Laser C. Lennox Grumbles, PharmD Pharmacy Resident  Pager: [phone] 07/08/2015 10:19 AM

## 2015-07-08 NOTE — Progress Notes (Signed)
Subjective:  Somewhat difficult to assess but appears to be feeling better with no shortness of breath or chest pain.  Objective:  Vital Signs in the last 24 hours: BP 107/69 mmHg  Pulse 69  Temp(Src) 98 F (36.7 C) (Oral)  Resp 18  Ht 6\' 2"  (1.88 m)  Wt 125.6 kg (276 lb 14.4 oz)  BMI 35.54 kg/m2  SpO2 97%  Physical Exam: Black male currently in no acute distress Lungs:  Clear, somewhat diminished breath sounds at bases Cardiac:  Regular rhythm, normal S1 and S2, no S3 Abdomen:  Soft, nontender, no masses Extremities:  2+ woody edema present of the lower extremities.  Intake/Output from previous day: 01/06 0701 - 01/07 0700 In: 1320 [P.O.:1320] Out: 2550 [Urine:2550]  Weight Filed Weights   07/06/15 1548 07/07/15 0615 07/08/15 0456  Weight: 128.232 kg (282 lb 11.2 oz) 125.601 kg (276 lb 14.4 oz) 125.6 kg (276 lb 14.4 oz)    Lab Results: Basic Metabolic Panel:  Recent Labs  07/07/15 0235 07/08/15 0334  NA 143 140  K 5.1 3.7  CL 105 101  CO2 29 32  GLUCOSE 84 126*  BUN 31* 31*  CREATININE 1.83* 1.83*   CBC:  Recent Labs  07/06/15 1719  WBC 4.8  NEUTROABS 3.2  HGB 12.3*  HCT 40.1  MCV 88.1  PLT 120*   Telemetry: AV paced rhythm, no atrial fibrillation.  Assessment/Plan:  1.  Acute on chronic combined systolic and diastolic congestive heart failure-his volume status is still up and his weight is the same.  His blood pressure was somewhat low and his BiDil dose was reduced yesterday.  Chest x-ray yesterday showed no acute problems and no heart failure or pleural effusions. 2.  Paroxysmal atrial fibrillation currently AV paced 3.  Stage III chronic kidney disease 4.  COPD  Recommendations:  Weight is the same today.  I would continue intravenous diuresis one additional day and then change to oral diuretics tomorrow.      Kerry Hough  MD [facility]  07/08/2015, 12:02 PM

## 2015-07-09 DIAGNOSIS — I5189 Other ill-defined heart diseases: Secondary | ICD-10-CM | POA: Diagnosis present

## 2015-07-09 LAB — BASIC METABOLIC PANEL
Anion gap: 9 (ref 5–15)
BUN: 31 mg/dL — ABNORMAL HIGH (ref 6–20)
CALCIUM: 9 mg/dL (ref 8.9–10.3)
CHLORIDE: 102 mmol/L (ref 101–111)
CO2: 29 mmol/L (ref 22–32)
CREATININE: 1.61 mg/dL — AB (ref 0.61–1.24)
GFR calc non Af Amer: 39 mL/min — ABNORMAL LOW (ref >60)
GFR, EST AFRICAN AMERICAN: 45 mL/min — AB (ref >60)
Glucose, Bld: 107 mg/dL — ABNORMAL HIGH (ref 65–99)
Potassium: 3.5 mmol/L (ref 3.5–5.1)
SODIUM: 140 mmol/L (ref 135–145)

## 2015-07-09 LAB — PROTIME-INR
INR: 2.49 — AB (ref 0.00–1.49)
PROTHROMBIN TIME: 26.6 s — AB (ref 11.6–15.2)

## 2015-07-09 MED ORDER — POTASSIUM CHLORIDE CRYS ER 20 MEQ PO TBCR
20.0000 meq | EXTENDED_RELEASE_TABLET | Freq: Two times a day (BID) | ORAL | Status: DC
  Administered 2015-07-09: 20 meq via ORAL
  Filled 2015-07-09: qty 1

## 2015-07-09 MED ORDER — SODIUM CHLORIDE 0.9 % IV BOLUS (SEPSIS)
250.0000 mL | Freq: Once | INTRAVENOUS | Status: AC
  Administered 2015-07-09: 250 mL via INTRAVENOUS

## 2015-07-09 NOTE — Progress Notes (Signed)
Subjective:  Main complaint is worsening of chronic LE edema  Objective:  Vital Signs in the last 24 hours: Temp:  [97.4 F (36.3 C)-97.7 F (36.5 C)] 97.7 F (36.5 C) (01/08 0622) Pulse Rate:  [69-71] 70 (01/08 0622) Resp:  [20] 20 (01/08 0622) BP: (101-123)/(59-80) 123/80 mmHg (01/08 0622) SpO2:  [94 %-98 %] 98 % (01/08 0723) Weight:  [274 lb 9.6 oz (124.558 kg)] 274 lb 9.6 oz (124.558 kg) (01/08 0622)  Intake/Output from previous day:  Intake/Output Summary (Last 24 hours) at 07/09/15 0754 Last data filed at 07/09/15 [id number]  Gross per 24 hour  Intake    960 ml  Output   2875 ml  Net  -1915 ml    Physical Exam: General appearance: alert, cooperative, no distress and moderately obese Lungs: decreased at bases Heart: regular rate and rhythm Extremities: chronic venous edema skin changes Neurologic: Grossly normal   Rate: 70  Rhythm: paced  Lab Results:  Recent Labs  07/06/15 1719  WBC 4.8  HGB 12.3*  PLT 120*    Recent Labs  07/08/15 0334 07/09/15 0352  NA 140 140  K 3.7 3.5  CL 101 102  CO2 32 29  GLUCOSE 126* 107*  BUN 31* 31*  CREATININE 1.83* 1.61*   No results for input(s): TROPONINI in the last 72 hours.  Invalid input(s): CK, MB  Recent Labs  07/09/15 0352  INR 2.49*    Scheduled Meds: . budesonide-formoterol  2 puff Inhalation BID  . calcitRIOL  0.5 mcg Oral QODAY  . digoxin  125 mcg Oral q morning - 10a  . doxycycline  100 mg Oral Q12H  . febuxostat  80 mg Oral q morning - 10a  . furosemide  80 mg Intravenous Q8H  . isosorbide-hydrALAZINE  0.5 tablet Oral TID  . olopatadine  1 drop Both Eyes Daily  . pregabalin  150 mg Oral BID  . sodium chloride  3 mL Intravenous Q12H  . sotalol  160 mg Oral QHS  . tamsulosin  0.4 mg Oral q morning - 10a  . tiotropium  18 mcg Inhalation Daily  . vitamin C  1,000 mg Oral q morning - 10a  . warfarin  7.5 mg Oral Once per day on Sun Mon Wed Thu Fri Sat   And  . [START ON 07/11/2015]  warfarin  5 mg Oral Once per day on Tue  . Warfarin - Pharmacist Dosing Inpatient   Does not apply q1800   Continuous Infusions:  PRN Meds:.sodium chloride, acetaminophen, albuterol, ondansetron (ZOFRAN) IV, sodium chloride   Imaging: Dg Chest 2 View  07/07/2015  CLINICAL DATA:  Persistent shortness of breath. EXAM: CHEST  2 VIEW COMPARISON:  06/21/2013 and 05/25/2013. FINDINGS: The left subclavian AICD leads are unchanged within the right atrium and right ventricle. The heart size and mediastinal contours are stable with aortic atherosclerosis. The lungs are clear. There is no pleural effusion or pneumothorax. The bones appear unremarkable. IMPRESSION: Stable postoperative chest.  No acute cardiopulmonary process. Electronically Signed   By: Richardean Sale M.D.   On: 07/07/2015 08:16    Cardiac Studies: Echo 07/06/15 Study Conclusions  - Left ventricle: The cavity size was mildly dilated. Wall thickness was normal. Systolic function was severely reduced. The estimated ejection fraction was in the range of 20% to 25%. Diffuse hypokinesis. Features are consistent with a pseudonormal left ventricular filling pattern, with concomitant abnormal relaxation and increased filling pressure (grade 2 diastolic dysfunction). - Aortic valve: There was  trivial regurgitation. - Aortic root: The aortic root was mildly dilated. - Mitral valve: There was mild regurgitation. - Left atrium: The atrium was mildly dilated. - Right atrium: The atrium was mildly dilated.  Impressions:  - Severe global reduction in LV systolic function; grade 2 diastolic dysfunction; mild LVE; mild biatrial enlargement; RA and RV pacer wires noted; mobile density in RA probable lead; cannot R/O thrombus or vegetation; mild MR; mildly dilated aortic root measuring 4.3 cm; suggest CTA to further assess.  Assessment/Plan:  82 y.o.obese, AA male with history of chronic combined HF, EF 20-25% due to NICM,  CHB s/p MDT BiV ICD 2002 ([place]) with gen change 2011 and March 2016, HTN, Hx of DVT-s/p IVC filter and on chronic anticoag with coumadin, COPD on inhalers, PAF-NSR on Betap[place], CKD stage III (not on ACE or ARB) and hx of OSA -on C-PAP, who presented to EP clinic 07/06/15 with 15 lb weight gain and worsening DOE. Pt 296 lbs, previous baseline in 270s. He has diuresed 8lbs (282-274). Interesting that BNP was only 183 on admission.   Principal Problem:   Acute on chronic combined systolic and diastolic congestive heart failure, NYHA class 3 (HCC) Active Problems:   NICM (nonischemic cardiomyopathy) (HCC)   CKD (chronic kidney disease) stage 3, GFR 30-59 ml/min   PAF (paroxysmal atrial fibrillation) (HCC)   Diastolic dysfunction-grade 2   Obstructive sleep apnea   COPD (chronic obstructive pulmonary disease) (HCC)   S/P IVC filter   Atrial tachycardia (HCC)   History of DVT (deep vein thrombosis)   Anticoagulated on Coumadin   PLAN:  Continue IV Lasix. BiDil added. Will add K+ 20 meq BID. ? Home 24 hrs.   [employer] PA-C 07/09/2015, 7:54 AM [phone]

## 2015-07-09 NOTE — Progress Notes (Signed)
Utilization review completed.  

## 2015-07-09 NOTE — Progress Notes (Signed)
   07/09/15 1910  Vitals  BP 121/81 mmHg  BP Location Left Arm  BP Method Automatic  Patient Position (if appropriate) Lying  Pulse Rate 72  Pulse Rate Source Dinamap        Above v/s isPost 250ML NS bolus .

## 2015-07-09 NOTE — Progress Notes (Signed)
Called b y Therapist, sports for orthostatic symptoms. Will bolus with IV NS 250 cc, hold Lasix and K+ and re evaluate diuretics in AM.   Kerin Ransom PA-C 07/09/2015 5:01 PM

## 2015-07-09 NOTE — Progress Notes (Addendum)
[place] for Warfarin  Indication: atrial fibrillation, hx DVT  Allergies  Allergen Reactions  . Amiodarone     hyperthyroidism    Patient Measurements: Height: 6\' 2"  (188 cm) Weight: 274 lb 9.6 oz (124.558 kg) IBW/kg (Calculated) : 82.2   Vital Signs: Temp: 98 F (36.7 C) (01/08 1241) Temp Source: Oral (01/08 1241) BP: 117/89 mmHg (01/08 1241) Pulse Rate: 69 (01/08 1241)  Labs:  Recent Labs  07/06/15 1719 07/07/15 0235 07/08/15 0334 07/09/15 0352  HGB 12.3*  --   --   --   HCT 40.1  --   --   --   PLT 120*  --   --   --   LABPROT  --  28.7* 29.0* 26.6*  INR  --  2.75* 2.79* 2.49*  CREATININE 1.87* 1.83* 1.83* 1.61*    Estimated Creatinine Clearance: 52.2 mL/min (by C-G formula based on Cr of 1.61).  Assessment: 82 year old male with PAF on chronic comadin seen in EP office for routine ICD follow up found to have volume overload.   INR was 2.7 at office this am. PTA warfarin dose was 7.5mg  daily except 5mg  on Tuesday. Will continue with home dose for now.  INR this AM = 2.49, CBC stable on 1/6  Goal of Therapy:  INR 2-3 Monitor platelets by anticoagulation protocol: Yes   Plan:  Continue home dose of warfarin 7.5mg  daily except 5mg  on Tuesdays Will switch to 3x a week INR levels since stable on admit and x 3 days  AM CBC   Jamiel Goncalves C. Lennox Grumbles, PharmD Pharmacy Resident  Pager: [phone] 07/09/2015 1:29 PM

## 2015-07-09 NOTE — Evaluation (Signed)
Occupational Therapy Evaluation Patient Details Name: Maurice Delgado MRN: [id number] DOB: Aug 19, 1933 Today's Date: 07/09/2015    History of Present Illness 82 yo male with onset of CHF and increased LE edema, has PMHx:  chronic O2 use, gout, AICD, CKD 3, DVT   Clinical Impression   Pt admitted with CHF and LE edema. Pt currently with functional limitations due to the deficits listed below (see OT Problem List). Pt will benefit from skilled OT to increase their safety and independence with ADL and functional mobility for ADL to facilitate discharge to venue listed below.      Follow Up Recommendations  Home health OT    Equipment Recommendations  3 in 1 bedside comode;Other (comment) (if pt agreeable)       Precautions / Restrictions Precautions Precautions: Fall;ICD/Pacemaker Restrictions Weight Bearing Restrictions: No      Mobility Bed Mobility               General bed mobility comments: pt in chair  Transfers Overall transfer level: Needs assistance Equipment used: 1 person hand held assist Transfers: Sit to/from Stand;Stand Pivot Transfers Sit to Stand: Mod assist Stand pivot transfers: Min assist                 ADL Overall ADL's : Needs assistance/impaired Eating/Feeding: Set up;Sitting   Grooming: Set up;Sitting   Upper Body Bathing: Set up   Lower Body Bathing: Maximal assistance;Sit to/from stand   Upper Body Dressing : Set up;Sitting   Lower Body Dressing: Moderate assistance;Sit to/from stand;Cueing for sequencing;Cueing for safety   Toilet Transfer: Moderate assistance;Cueing for safety Toilet Transfer Details (indicate cue type and reason): sit to stand with urinal Toileting- Clothing Manipulation and Hygiene: Moderate assistance;Sit to/from stand         General ADL Comments: pt impulsive at times and needed VC for safety               Pertinent Vitals/Pain Pain Assessment: No/denies pain     Hand Dominance      Extremity/Trunk Assessment Upper Extremity Assessment Upper Extremity Assessment: Generalized weakness           Communication Communication Communication: No difficulties   Cognition Arousal/Alertness: Awake/alert Behavior During Therapy: WFL for tasks assessed/performed Overall Cognitive Status: No family/caregiver present to determine baseline cognitive functioning                                Home Living Family/patient expects to be discharged to:: Private residence Living Arrangements: Non-relatives/Friends;Alone (not clear what availability is) Available Help at Discharge: Other (Comment);Friend(s) (unclear what the availability is.) Type of Home: House Home Access: Level entry     Home Layout: Two level Alternate Level Stairs-Number of Steps: 14 Alternate Level Stairs-Rails: Left Bathroom Shower/Tub: Tub/shower unit;Walk-in shower   Bathroom Toilet: Handicapped height Bathroom Accessibility: No   Home Equipment: Cane - single point          Prior Functioning/Environment Level of Independence: Independent with assistive device(s)             OT Diagnosis: Generalized weakness   OT Problem List: Decreased strength;Decreased activity tolerance   OT Treatment/Interventions: Self-care/ADL training;DME and/or AE instruction;Energy conservation;Patient/family education    OT Goals(Current goals can be found in the care plan section) Acute Rehab OT Goals Patient Stated Goal: to get home soon OT Goal Formulation: With patient Time For Goal Achievement: 07/23/15 Potential to Achieve Goals: Good  OT Frequency: Min 2X/week              End of Session Nurse Communication: Mobility status  Activity Tolerance: Patient tolerated treatment well Patient left: in chair;with call bell/phone within reach   Time: 1214-1236 OT Time Calculation (min): 22 min Charges:  OT General Charges $OT Visit: 1 Procedure OT Evaluation $OT Eval Low  Complexity: 1 Procedure G-Codes:    Betsy Pries Jul 16, 2015, 1:37 PM

## 2015-07-10 ENCOUNTER — Other Ambulatory Visit (HOSPITAL_COMMUNITY): Payer: Self-pay | Admitting: Adult Health

## 2015-07-10 LAB — CBC
HEMATOCRIT: 38.4 % — AB (ref 39.0–52.0)
Hemoglobin: 11.7 g/dL — ABNORMAL LOW (ref 13.0–17.0)
MCH: 26.4 pg (ref 26.0–34.0)
MCHC: 30.5 g/dL (ref 30.0–36.0)
MCV: 86.7 fL (ref 78.0–100.0)
PLATELETS: 124 10*3/uL — AB (ref 150–400)
RBC: 4.43 MIL/uL (ref 4.22–5.81)
RDW: 16 % — AB (ref 11.5–15.5)
WBC: 4.5 10*3/uL (ref 4.0–10.5)

## 2015-07-10 LAB — BASIC METABOLIC PANEL
Anion gap: 7 (ref 5–15)
BUN: 28 mg/dL — AB (ref 6–20)
CO2: 31 mmol/L (ref 22–32)
Calcium: 9 mg/dL (ref 8.9–10.3)
Chloride: 105 mmol/L (ref 101–111)
Creatinine, Ser: 1.56 mg/dL — ABNORMAL HIGH (ref 0.61–1.24)
GFR, EST AFRICAN AMERICAN: 47 mL/min — AB (ref >60)
GFR, EST NON AFRICAN AMERICAN: 41 mL/min — AB (ref >60)
Glucose, Bld: 105 mg/dL — ABNORMAL HIGH (ref 65–99)
POTASSIUM: 3.6 mmol/L (ref 3.5–5.1)
SODIUM: 143 mmol/L (ref 135–145)

## 2015-07-10 LAB — PROTIME-INR
INR: 2.42 — AB (ref 0.00–1.49)
Prothrombin Time: 26.1 seconds — ABNORMAL HIGH (ref 11.6–15.2)

## 2015-07-10 MED ORDER — ISOSORB DINITRATE-HYDRALAZINE 20-37.5 MG PO TABS: 0.5000 | ORAL_TABLET | Freq: Three times a day (TID) | ORAL | Status: DC

## 2015-07-10 MED ORDER — POTASSIUM CHLORIDE CRYS ER 20 MEQ PO TBCR
40.0000 meq | EXTENDED_RELEASE_TABLET | Freq: Once | ORAL | Status: AC
  Administered 2015-07-10: 40 meq via ORAL
  Filled 2015-07-10: qty 2

## 2015-07-10 MED ORDER — POTASSIUM CHLORIDE CRYS ER 20 MEQ PO TBCR: 20.0000 meq | EXTENDED_RELEASE_TABLET | Freq: Every day | ORAL | Status: DC

## 2015-07-10 MED ORDER — TORSEMIDE 20 MG PO TABS: 60.0000 mg | ORAL_TABLET | Freq: Two times a day (BID) | ORAL | Status: DC

## 2015-07-10 MED ORDER — DOXYCYCLINE HYCLATE 100 MG PO TABS: 100.0000 mg | ORAL_TABLET | Freq: Two times a day (BID) | ORAL | Status: DC

## 2015-07-10 NOTE — Progress Notes (Signed)
[place] for Warfarin  Indication: atrial fibrillation, hx DVT  Allergies  Allergen Reactions  . Amiodarone     hyperthyroidism    Patient Measurements: Height: 6\' 2"  (188 cm) Weight: 274 lb 3.2 oz (124.376 kg) (scale a) IBW/kg (Calculated) : 82.2   Vital Signs: Temp: 97.6 F (36.4 C) (01/09 0604) Temp Source: Oral (01/09 0604) BP: 142/95 mmHg (01/09 0604) Pulse Rate: 70 (01/09 0604)  Labs:  Recent Labs  07/08/15 0334 07/09/15 0352 07/10/15 0518  HGB  --   --  11.7*  HCT  --   --  38.4*  PLT  --   --  124*  LABPROT 29.0* 26.6* 26.1*  INR 2.79* 2.49* 2.42*  CREATININE 1.83* 1.61* 1.56*    Estimated Creatinine Clearance: 53.8 mL/min (by C-G formula based on Cr of 1.56).  Assessment: 82 year old male with PAF on chronic comadin seen in EP office for routine ICD follow up found to have volume overload. INR remains therapeutic on his home regimen at 2.42. CBC is stable and no bleeding noted.   Goal of Therapy:  INR 2-3 Monitor platelets by anticoagulation protocol: Yes   Plan:  - Continue home coumadin regimen of 7.5mg  daily except 5mg  on Tuesdays - Daily INR  Salome Arnt, PharmD, BCPS Pager # [phone] 07/10/2015 8:17 AM

## 2015-07-10 NOTE — Progress Notes (Signed)
Pt has orders to be discharged. Discharge instructions given and pt has no additional questions at this time. Medication regimen reviewed and pt educated. Pt verbalized understanding and has no additional questions. Telemetry box removed. IV removed and site in good condition. Pt stable and waiting for transportation. 

## 2015-07-10 NOTE — Care Management Important Message (Signed)
Important Message  Patient Details  Name: Maurice Delgado MRN: [id number] Date of Birth: [birth year]   Medicare Important Message Given:  Yes    Louanne Belton 07/10/2015, 1:05 PMImportant Message  Patient Details  Name: Maurice Delgado MRN: [id number] Date of Birth: [birth year]   Medicare Important Message Given:  Yes    Nicolis Boody G 07/10/2015, 1:05 PM

## 2015-07-10 NOTE — Care Management Note (Signed)
Case Management Note  Patient Details  Name: Maurice Delgado MRN: [id number] Date of Birth: January 24, 1934  Subjective/Objective:       Initially this morning talked to patient about HH at home, patient states he has had and did not want them again.  However wife now in room and in talking with them both are agreeable to [facility] RN and PT.  Wife states has had [facility] in past and would like them back.   Referral made.              Action/Plan:   Expected Discharge Date:  07/10/15               Expected Discharge Plan:  [place]  In-House Referral:     Discharge planning Services  CM Consult  Post Acute Care Choice:    Choice offered to:  Patient  DME Arranged:    DME Agency:     HH Arranged:  RN, Disease Management, PT [place] Agency:  [place]  Status of Service:  In process, will continue to follow  Medicare Important Message Given:  Yes Date Medicare IM Given:    Medicare IM give by:    Date Additional Medicare IM Given:    Additional Medicare Important Message give by:     If discussed at [place] of Stay Meetings, dates discussed:    Additional Comments:  Vergie Living, RN 07/10/2015, 2:01 PM

## 2015-07-10 NOTE — Discharge Summary (Signed)
Advanced Heart Failure Team  Discharge Summary   Patient ID: Maurice Delgado MRN: [id number], DOB/AGE: 1933-10-20 83 y.o. Admit date: 07/06/2015 D/C date:     07/10/2015   Primary Discharge Diagnoses:  1. A/C combined heart failure: 07/06/2015 ECHO EF 20-25% 2.  Atrial fibrillation, paroxysmal 3. CKD Stage III 4. H/O DVT S/P IVC filter- Chronic warfarin 5. COPD  Hospital Course:  Maurice Delgado is a 82 y.o. male with history of chronic combined HF, Echo 08/2014 EF 20-25% due to NICM, CHB s/p MDT CRT-D 2002 with gen change 2011, HTN, Hx of DVT on chronic anticoag with coumadin, COPD, PAF, CKD stage III and hx of OSA admitted after he presented to EP clinic visit with 15 lb weight gain and worsening DOE. HF asked to admit for  A/C systolic CHF.   1. Acute on chronic combined CHF: Echo 07/06/15 20-25% - NYHA class IIIb-IV symptoms. Admitted with volume overload. Prior to admit he was on lasix 160 mg po twice daily. Diuresed 8 pounds. He was diuresed with IV lasix and transitioned to 60 mg torsemide twice a day.   Continue Bidil 1/2 tab tid. Continue digoxin. Level 0.5 07/06/15 2. Atrial fibrillation, paroxysmal: He remained A paced and stable throughout his hospitalization. Continue warfarin and sotalol. Todays INR 2.42  3. CKD: Stage III. In the community he is followed by Dr Deterding. Renal function was stable.   4. H/o DVT s/p IVC filter: INR on the day of discharge was 2.42. He will continue his home warfarin regimen. He already had follow up at [facility]mad[facility].  5. COPD exacerbation:  Started Azithromycin 07/05/15 for ?mild exacerbation per PCP. Switched to doxycycline this admission with use of sotalol,. He is on day 5/7 of doxycycline and will continue for 2 more days .   He will continue to be followed closely in the HF clinic. Plan to check BMET at follow up appointment.   Discharge Weight: 274 pounds  Discharge Vitals: Blood pressure 142/95, pulse 71, temperature 97.6 F (36.4  C), temperature source Oral, resp. rate 18, height 6\' 2"  (1.88 m), weight 274 lb 3.2 oz (124.376 kg), SpO2 97 %.  Labs: Lab Results  Component Value Date   WBC 4.5 07/10/2015   HGB 11.7* 07/10/2015   HCT 38.4* 07/10/2015   MCV 86.7 07/10/2015   PLT 124* 07/10/2015    Recent Labs Lab 07/06/15 1719  07/10/15 0518  NA 141  < > 143  K 4.4  < > 3.6  CL 100*  < > 105  CO2 34*  < > 31  BUN 30*  < > 28*  CREATININE 1.87*  < > 1.56*  CALCIUM 9.7  < > 9.0  PROT 6.4*  --   --   BILITOT 0.8  --   --   ALKPHOS 76  --   --   ALT 13*  --   --   AST 13*  --   --   GLUCOSE 95  < > 105*  < > = values in this interval not displayed. No results found for: CHOL, HDL, LDLCALC, TRIG BNP (last 3 results)  Recent Labs  07/06/15 1719  BNP 183.1*    ProBNP (last 3 results) No results for input(s): PROBNP in the last 8760 hours.   Diagnostic Studies/Procedures   No results found.  Discharge Medications     Medication List    STOP taking these medications        furosemide 80 MG tablet  Commonly known as:  LASIX      TAKE these medications        budesonide-formoterol 160-4.5 MCG/ACT inhaler  Commonly known as:  SYMBICORT  Inhale 2 puffs into the lungs 2 (two) times daily.     calcitRIOL 0.5 MCG capsule  Commonly known as:  ROCALTROL  Take 0.5 mcg by mouth every other day.     DIGITEK 0.125 MG tablet  Generic drug:  digoxin  TAKE 1 TABLET EVERY MORNING     doxycycline 100 MG tablet  Commonly known as:  VIBRA-TABS  Take 1 tablet (100 mg total) by mouth every 12 (twelve) hours.     isosorbide-hydrALAZINE 20-37.5 MG tablet  Commonly known as:  BIDIL  Take 0.5 tablets by mouth 3 (three) times daily.     levalbuterol 45 MCG/ACT inhaler  Commonly known as:  XOPENEX HFA  Inhale 2 puffs into the lungs every 6 (six) hours as needed for wheezing.     PATADAY 0.2 % Soln  Generic drug:  Olopatadine HCl  Place 1 drop into both eyes every morning.     potassium chloride  SA 20 MEQ tablet  Commonly known as:  K-DUR,KLOR-CON  Take 1 tablet (20 mEq total) by mouth daily.  Start taking on:  07/11/2015     pregabalin 150 MG capsule  Commonly known as:  LYRICA  Take 150 mg by mouth 2 (two) times daily.     sotalol 160 MG tablet  Commonly known as:  BETAPACE  Take 1 tablet (160 mg total) by mouth daily.     tamsulosin 0.4 MG Caps capsule  Commonly known as:  FLOMAX  Take 0.4 mg by mouth every morning.     tiotropium 18 MCG inhalation capsule  Commonly known as:  SPIRIVA  Place 1 capsule (18 mcg total) into inhaler and inhale every morning.     torsemide 20 MG tablet  Commonly known as:  DEMADEX  Take 3 tablets (60 mg total) by mouth 2 (two) times daily.     ULORIC 80 MG Tabs  Generic drug:  Febuxostat  Take 80 mg by mouth every morning.     vitamin C 1000 MG tablet  Take 1,000 mg by mouth every morning.     warfarin 5 MG tablet  Commonly known as:  COUMADIN  Take as directed by coumadin clinic        Disposition   The patient will be discharged in stable condition to home.     Discharge Instructions    (HEART FAILURE PATIENTS) Call MD:  Anytime you have any of the following symptoms: 1) 3 pound weight gain in 24 hours or 5 pounds in 1 week 2) shortness of breath, with or without a dry hacking cough 3) swelling in the hands, feet or stomach 4) if you have to sleep on extra pillows at night in order to breathe.    Complete by:  As directed      Call MD for:  difficulty breathing, headache or visual disturbances    Complete by:  As directed      Diet - low sodium heart healthy    Complete by:  As directed      Heart Failure patients record your daily weight using the same scale at the same time of day    Complete by:  As directed      Increase activity slowly    Complete by:  As directed  Follow-up Information    Follow up with [facility].   Specialty:  [place] [place]   Why:  They will do  your home health care at your home   Contact information:   [place] [address] [phone]       Follow up with Loralie Champagne, MD On 07/20/2015.   Specialty:  Cardiology   Why:  at 2:30 [place] information:   [address] [place] [address] [phone]         Duration of Discharge Encounter: Greater than 35 minutes   Signed, Amy Clegg NP-C  07/10/2015, 11:52 AM

## 2015-07-10 NOTE — Progress Notes (Signed)
Advanced Heart Failure Rounding Note  Primary Physician: Barbette Merino, MD Primary Cardiologist: Dr Tamala Julian Primary EP: Dr Rayann Heman Primary HF: (New)  Subjective:    Yesterday had soft bp. Given 250 cc NS bolus. Diuretics held.   Denies SOB/Orthopnea. Dizziness improved.   Echo 07/06/15 LVEF 20-25% with grade 2 DD. Mild MR.   Objective:   Weight Range: 274 lb 3.2 oz (124.376 kg) Body mass index is 35.19 kg/(m^2).   Vital Signs:   Temp:  [97.6 F (36.4 C)-98 F (36.7 C)] 97.6 F (36.4 C) (01/09 0604) Pulse Rate:  [69-72] 70 (01/09 0604) Resp:  [18-20] 18 (01/09 0604) BP: (92-142)/(54-95) 142/95 mmHg (01/09 0604) SpO2:  [87 %-100 %] 97 % (01/09 0739) Weight:  [274 lb 3.2 oz (124.376 kg)] 274 lb 3.2 oz (124.376 kg) (01/09 0604) Last BM Date: 07/08/15  Weight change: Filed Weights   07/08/15 0456 07/09/15 0622 07/10/15 0604  Weight: 276 lb 14.4 oz (125.6 kg) 274 lb 9.6 oz (124.558 kg) 274 lb 3.2 oz (124.376 kg)    Intake/Output:   Intake/Output Summary (Last 24 hours) at 07/10/15 1012 Last data filed at 07/10/15 0858  Gross per 24 hour  Intake   1580 ml  Output   1200 ml  Net    380 ml     Physical Exam: General: Elderly appearing. NAD. In bed.  HEENT: normal Neck: supple.  JVD flat.  Carotids 2+ bilat; no bruits. No thyromegaly or nodule noted. Cor: PMI nondisplaced. Regular, hear sounds slightly distant. No M/G/R appreciated. Lungs: Clear.  Abdomen: Obese, soft, NT, ND, no HSM. No bruits or masses. +BS  Extremities: no cyanosis, clubbing, rash. R and LLE trace edema.  Neuro: alert & oriented x 3, cranial nerves grossly intact. moves all 4 extremities w/o difficulty. Affect flat.  Telemetry: AV pacing av pacing in 70s  Labs: CBC  Recent Labs  07/10/15 0518  WBC 4.5  HGB 11.7*  HCT 38.4*  MCV 86.7  PLT [id number]*   Basic Metabolic Panel  Recent Labs  07/09/15 0352 07/10/15 0518  NA 140 143  K 3.5 3.6  CL 102 105  CO2 29 31  GLUCOSE 107* 105*  BUN  31* 28*  CREATININE 1.61* 1.56*  CALCIUM 9.0 9.0   Liver Function Tests No results for input(s): AST, ALT, ALKPHOS, BILITOT, PROT, ALBUMIN in the last 72 hours. No results for input(s): LIPASE, AMYLASE in the last 72 hours. Cardiac Enzymes No results for input(s): CKTOTAL, CKMB, CKMBINDEX, TROPONINI in the last 72 hours.  BNP: BNP (last 3 results)  Recent Labs  07/06/15 1719  BNP 183.1*    Imaging/Studies:  No results found.  Latest Echo  Latest Cath   Medications:     Scheduled Medications: . budesonide-formoterol  2 puff Inhalation BID  . calcitRIOL  0.5 mcg Oral QODAY  . digoxin  125 mcg Oral q morning - 10a  . doxycycline  100 mg Oral Q12H  . febuxostat  80 mg Oral q morning - 10a  . isosorbide-hydrALAZINE  0.5 tablet Oral TID  . olopatadine  1 drop Both Eyes Daily  . pregabalin  150 mg Oral BID  . sodium chloride  3 mL Intravenous Q12H  . sotalol  160 mg Oral QHS  . tamsulosin  0.4 mg Oral q morning - 10a  . tiotropium  18 mcg Inhalation Daily  . vitamin C  1,000 mg Oral q morning - 10a  . warfarin  7.5 mg Oral Once per day on Sun Mon  Wed Thu Fri Sat   And  . [START ON 07/11/2015] warfarin  5 mg Oral Once per day on Tue  . Warfarin - Pharmacist Dosing Inpatient   Does not apply q1800    Infusions:    PRN Medications: sodium chloride, acetaminophen, albuterol, ondansetron (ZOFRAN) IV, sodium chloride   Assessment/Plan   Maurice Delgado is a 82 y.o. male with history of chronic combined HF, Echo 08/2014 EF 20-25% due to NICM, CHB s/p MDT CRT-D 2002 with gen change 2011, HTN, Hx of DVT on chronic anticoag with coumadin, COPD, PAF, CKD stage III and hx of OSA admitted after he presented to EP clinic visit with 15 lb weight gain and worsening DOE. HF asked to see/admit with A/C systolic CHF.   1. Acute on chronic combined CHF: Echo 07/06/15 20-25% - NYHA class IIIb-IV symptoms. Overall he has diuresed 8 pounds. Prior to admit he was on lasix 160 mg po  twice daily. Received 250 cc bolus yesterday due to soft BP. Today he appears euvolemic. Check orthostatic BP.  If ok will start torsemide 60 mg twice a day. Could start tomorrow.  - Continue Bidil 1/2 tab tid. - Continue digoxin. Level 0.5 07/06/15 2. Atrial fibrillation, paroxysmal:  - Currently a-paced.  - Continue warfarin and sotalol.  Todays INR 2.42  3. CKD: Stage III. In the community he is followed by Dr Deterding.  - Renal function stable.  4. H/o DVT s/p IVC filter - Continue warfarin.  - INR 2.42 today.  5. COPD: - Started Azithromycin 07/05/15 for ?mild exacerbation per PCP.  - Switched to doxycycline this admission with use of sotalol,. He is on day 5/7 of doxycycline.   He does not want HH.   Length of Stay: 4   Amy Clegg NP-C  07/10/2015, 10:12 AM  Advanced Heart Failure Team Pager [phone] (M-F; 7a - 4p)  Please contact [facility] for night-coverage after hours (4p -7a ) and weekends on [email or web address]  Patient seen with NP, agree with the above note.  1. Acute on chronic systolic CHF: EF 0000000 in 2/16. Medtronic CRT device.  He had been on a high dose of Lasix at home but gained weight and developed NYHA class IIIb-IV symptoms, dyspneic walking around his house. He has been diuresed here with good response, now off IV diuretics.   - Will transition to torsemide 60 mg po bid starting this afternoon with adding KCl 20 daily.  - Mildly orthostatic => SBP 141 to 128 with standing.  Continue Bidil 1/2 tab tid.  - Continue digoxin, level ok.  2. Atrial fibrillation: a-paced. Paroxysmal atrial fibrillation. Continue warfarin and sotalol, paced QTc was ok on QTc.  3. CKD: Stage III. Creatinine stable.   4. H/o DVT: Has IVC filter, on warfarin.  5. COPD: ?mild exacerbation. PCP started azithromycin. With use of sotalol, stopped this and use doxycycline instead for 7 days abx.  6. Disposition: Home today.  Will have him followup with me in CHF clinic in about 2 wks.   Coumadin clinic followup. Home meds: digoxin 0.125 daily, warfarin, sotalol 160 bid, complete 7 days of doxycycline, Bidil 1/2 tab tid, torsemide 60 mg bid, KCl 20 daily.   Loralie Champagne 07/10/2015 11:46 AM

## 2015-07-11 ENCOUNTER — Telehealth: Payer: Self-pay | Admitting: *Deleted

## 2015-07-11 NOTE — Telephone Encounter (Signed)
Patient called to inform us that he has been in the hospital from 07/06/15-07/10/15.  He stated his lasix was changed to toresimide 60mg  twice a day, he is taking Bidal 5mg  tablet-1/2 tablet twice a day, potassium 2meq daily, and was given doxycycline 100mg  BID for 2 days (4 tabs given).  Advised that the Doxycycline can interfere with Coumadin and he verbalized understanding.  He was receiving Doxycycline while in hospital. He received Doxycycline 100mg  twice a day while in the hospital from 07/06/15-07/09/15 & on 07/10/15 he received one dose then d/c with a prescription for 4 tablets & he continued that night.  INR  07/06/15-2.7, 07/07/15-2.75, 07/08/15-2.79, 07/09/15-2.49, 07/10/15-2.42.  Patient was made a follow-up appointment in 1 week. He stated he preferred to return the same day he has his Heart Failure Clinic Appt & appt made for next week.

## 2015-07-12 DIAGNOSIS — N183 Chronic kidney disease, stage 3 (moderate): Secondary | ICD-10-CM | POA: Diagnosis not present

## 2015-07-12 DIAGNOSIS — J441 Chronic obstructive pulmonary disease with (acute) exacerbation: Secondary | ICD-10-CM | POA: Diagnosis not present

## 2015-07-12 DIAGNOSIS — I130 Hypertensive heart and chronic kidney disease with heart failure and stage 1 through stage 4 chronic kidney disease, or unspecified chronic kidney disease: Secondary | ICD-10-CM | POA: Diagnosis not present

## 2015-07-12 DIAGNOSIS — I480 Paroxysmal atrial fibrillation: Secondary | ICD-10-CM | POA: Diagnosis not present

## 2015-07-12 DIAGNOSIS — Z86718 Personal history of other venous thrombosis and embolism: Secondary | ICD-10-CM | POA: Diagnosis not present

## 2015-07-12 DIAGNOSIS — Z9981 Dependence on supplemental oxygen: Secondary | ICD-10-CM | POA: Diagnosis not present

## 2015-07-12 DIAGNOSIS — I5043 Acute on chronic combined systolic (congestive) and diastolic (congestive) heart failure: Secondary | ICD-10-CM | POA: Diagnosis not present

## 2015-07-12 DIAGNOSIS — Z87891 Personal history of nicotine dependence: Secondary | ICD-10-CM | POA: Diagnosis not present

## 2015-07-12 DIAGNOSIS — Z7901 Long term (current) use of anticoagulants: Secondary | ICD-10-CM | POA: Diagnosis not present

## 2015-07-12 DIAGNOSIS — G4733 Obstructive sleep apnea (adult) (pediatric): Secondary | ICD-10-CM | POA: Diagnosis not present

## 2015-07-12 LAB — CUP PACEART INCLINIC DEVICE CHECK
Implantable Lead Implant Date: 20020325
Implantable Lead Location: 753859
Implantable Lead Model: 6945
MDC IDC LEAD IMPLANT DT: 20020325
MDC IDC LEAD IMPLANT DT: 20020325
MDC IDC LEAD LOCATION: 753858
MDC IDC LEAD LOCATION: 753860
MDC IDC SESS DTM: 20170111113542

## 2015-07-13 DIAGNOSIS — G4733 Obstructive sleep apnea (adult) (pediatric): Secondary | ICD-10-CM | POA: Diagnosis not present

## 2015-07-13 DIAGNOSIS — J441 Chronic obstructive pulmonary disease with (acute) exacerbation: Secondary | ICD-10-CM | POA: Diagnosis not present

## 2015-07-13 DIAGNOSIS — N183 Chronic kidney disease, stage 3 (moderate): Secondary | ICD-10-CM | POA: Diagnosis not present

## 2015-07-13 DIAGNOSIS — I5043 Acute on chronic combined systolic (congestive) and diastolic (congestive) heart failure: Secondary | ICD-10-CM | POA: Diagnosis not present

## 2015-07-13 DIAGNOSIS — I130 Hypertensive heart and chronic kidney disease with heart failure and stage 1 through stage 4 chronic kidney disease, or unspecified chronic kidney disease: Secondary | ICD-10-CM | POA: Diagnosis not present

## 2015-07-13 DIAGNOSIS — I480 Paroxysmal atrial fibrillation: Secondary | ICD-10-CM | POA: Diagnosis not present

## 2015-07-17 DIAGNOSIS — N183 Chronic kidney disease, stage 3 (moderate): Secondary | ICD-10-CM | POA: Diagnosis not present

## 2015-07-17 DIAGNOSIS — I5043 Acute on chronic combined systolic (congestive) and diastolic (congestive) heart failure: Secondary | ICD-10-CM | POA: Diagnosis not present

## 2015-07-17 DIAGNOSIS — J441 Chronic obstructive pulmonary disease with (acute) exacerbation: Secondary | ICD-10-CM | POA: Diagnosis not present

## 2015-07-17 DIAGNOSIS — G4733 Obstructive sleep apnea (adult) (pediatric): Secondary | ICD-10-CM | POA: Diagnosis not present

## 2015-07-17 DIAGNOSIS — I480 Paroxysmal atrial fibrillation: Secondary | ICD-10-CM | POA: Diagnosis not present

## 2015-07-17 DIAGNOSIS — I130 Hypertensive heart and chronic kidney disease with heart failure and stage 1 through stage 4 chronic kidney disease, or unspecified chronic kidney disease: Secondary | ICD-10-CM | POA: Diagnosis not present

## 2015-07-19 DIAGNOSIS — G4733 Obstructive sleep apnea (adult) (pediatric): Secondary | ICD-10-CM | POA: Diagnosis not present

## 2015-07-19 DIAGNOSIS — I130 Hypertensive heart and chronic kidney disease with heart failure and stage 1 through stage 4 chronic kidney disease, or unspecified chronic kidney disease: Secondary | ICD-10-CM | POA: Diagnosis not present

## 2015-07-19 DIAGNOSIS — I480 Paroxysmal atrial fibrillation: Secondary | ICD-10-CM | POA: Diagnosis not present

## 2015-07-19 DIAGNOSIS — I5043 Acute on chronic combined systolic (congestive) and diastolic (congestive) heart failure: Secondary | ICD-10-CM | POA: Diagnosis not present

## 2015-07-19 DIAGNOSIS — N183 Chronic kidney disease, stage 3 (moderate): Secondary | ICD-10-CM | POA: Diagnosis not present

## 2015-07-19 DIAGNOSIS — J441 Chronic obstructive pulmonary disease with (acute) exacerbation: Secondary | ICD-10-CM | POA: Diagnosis not present

## 2015-07-20 ENCOUNTER — Ambulatory Visit (HOSPITAL_COMMUNITY)
Admit: 2015-07-20 | Discharge: 2015-07-20 | Disposition: A | Discharge status: Home / Self Care | Payer: Medicare Other | Source: Ambulatory Visit | Attending: Cardiology | Admitting: Cardiology

## 2015-07-20 ENCOUNTER — Ambulatory Visit (INDEPENDENT_AMBULATORY_CARE_PROVIDER_SITE_OTHER): Payer: Medicare Other | Admitting: *Deleted

## 2015-07-20 VITALS — BP 110/68 | HR 73 | Wt 287.5 lb

## 2015-07-20 DIAGNOSIS — R0600 Dyspnea, unspecified: Secondary | ICD-10-CM

## 2015-07-20 DIAGNOSIS — I480 Paroxysmal atrial fibrillation: Secondary | ICD-10-CM

## 2015-07-20 DIAGNOSIS — N183 Chronic kidney disease, stage 3 unspecified: Secondary | ICD-10-CM

## 2015-07-20 DIAGNOSIS — I5189 Other ill-defined heart diseases: Secondary | ICD-10-CM

## 2015-07-20 DIAGNOSIS — I519 Heart disease, unspecified: Secondary | ICD-10-CM | POA: Diagnosis not present

## 2015-07-20 DIAGNOSIS — Z5181 Encounter for therapeutic drug level monitoring: Secondary | ICD-10-CM | POA: Diagnosis not present

## 2015-07-20 DIAGNOSIS — J438 Other emphysema: Secondary | ICD-10-CM

## 2015-07-20 LAB — BASIC METABOLIC PANEL
Anion gap: 12 (ref 5–15)
BUN: 42 mg/dL — AB (ref 6–20)
CHLORIDE: 100 mmol/L — AB (ref 101–111)
CO2: 30 mmol/L (ref 22–32)
CREATININE: 2.33 mg/dL — AB (ref 0.61–1.24)
Calcium: 9.4 mg/dL (ref 8.9–10.3)
GFR calc Af Amer: 29 mL/min — ABNORMAL LOW (ref >60)
GFR calc non Af Amer: 25 mL/min — ABNORMAL LOW (ref >60)
GLUCOSE: 93 mg/dL (ref 65–99)
POTASSIUM: 4.5 mmol/L (ref 3.5–5.1)
SODIUM: 142 mmol/L (ref 135–145)

## 2015-07-20 LAB — DIGOXIN LEVEL: Digoxin Level: 0.5 ng/mL — ABNORMAL LOW (ref 0.8–2.0)

## 2015-07-20 LAB — BRAIN NATRIURETIC PEPTIDE: B Natriuretic Peptide: 75 pg/mL (ref 0.0–100.0)

## 2015-07-20 LAB — POCT INR: INR: 3

## 2015-07-20 NOTE — Patient Instructions (Signed)
Labs today  Your physician recommends that you schedule a follow-up appointment in: 2 weeks  

## 2015-07-21 NOTE — Progress Notes (Signed)
Patient ID: Maurice Delgado, male   DOB: 22-Jul-1933, 82 y.o.   MRN: [id number] PCP: Dr. Jonelle Sidle HF Cardiology: Dr Aundra Dubin  82 yo with history of chronic systolic CHF due to NICM, CHB s/p MDT CRT-D 2002 with gen change 2011, HTN, history of DVT on chronic anticoag with coumadin, COPD, PAF, CKD stage III and hx of OSA who was admitted from [employer] clinic in 1/17 for weight gain and dyspnea.  He was thought to be volume overloaded and was diuresed.    He returns for post-hospital followup.  Stable at home.  Has PT coming in.  Weight is down 6 lbs.  ECG shows a-pacing/BiV pacing.  Rare lightheadedness with standing.  He can walk about 50 yards to the mailbox and can walk around his cul-de-sac but is short of breath with longer distances.  He has been sleeping in his recliner for a long time.  No chest pain.  No palpitations.   ECG: a-paced, BiV pacing.  QTc 490 msec.   Optivol assessed: Fluid index < threshold, impedance stable.   Labs (1/17): K 3.6, creatinine 1.56  PMH: 1. Sciatica 2. H/o DVT: Recurrent.  Has IVC filter 3. Complete heart block: Medtronic CRT-D device placed 2002.  4. Atrial fibrillation: Paroxysmal. On sotalol to maintain NSR.  5. OSA: CPAP.  6. Chronic systolic CHF: Echo (123456) with EF 20-25%, mildly dilated LV, normal RV size and systolic function, mild MR.  Nonischemic cardiomyopathy.  Has MDT CRT-D device.  7. COPD: On home oxygen.  8. History of retroperitoneal hemorrhage.  9. CKD: Stage III.  10. Gout 11. GERD 12. Atrial tachycardia: s/p ablation 2014.   Social History   Social History  . Marital Status: Married    Spouse Name: N/A  . Number of Children: N/A  . Years of Education: N/A   Occupational History  . reitred     from education   Social History Main Topics  . Smoking status: Former Smoker -- 1.50 packs/day for 30 years    Types: Cigarettes  . Smokeless tobacco: Never Used     Comment: 06/21/2013 "quit smoking ~ 1986"  . Alcohol Use: No     Comment:  06/21/2013 "quit drinking ~ 1970"  . Drug Use: Yes    Special: Marijuana     Comment: 06/21/2013 "stopped using marijuana ~ 1970"  . Sexual Activity: Not Currently   Other Topics Concern  . Not on file   Social History Narrative   Married and has 1 son.    Lives in [place].  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   Family History  Problem Relation Age of Onset  . Heart disease Mother     mother also had Rheumatism.  Marland Kitchen Heart attack Neg Hx   . Stroke Neg Hx   . Hypertension Mother    ROS: All systems reviewed and negative except as per HPI.   Current Outpatient Prescriptions  Medication Sig Dispense Refill  . Ascorbic Acid (VITAMIN C) 1000 MG tablet Take 1,000 mg by mouth every morning.     . budesonide-formoterol (SYMBICORT) 160-4.5 MCG/ACT inhaler Inhale 2 puffs into the lungs 2 (two) times daily. 3 Inhaler 1  . calcitRIOL (ROCALTROL) 0.5 MCG capsule Take 0.5 mcg by mouth every other day.     Marland Kitchen DIGITEK 125 MCG tablet TAKE 1 TABLET EVERY MORNING 90 tablet 0  . Febuxostat (ULORIC) 80 MG TABS Take 80 mg by mouth every morning.     . isosorbide-hydrALAZINE (BIDIL) 20-37.5 MG tablet Take  0.5 tablets by mouth 3 (three) times daily. 45 tablet 6  . levalbuterol (XOPENEX HFA) 45 MCG/ACT inhaler Inhale 2 puffs into the lungs every 6 (six) hours as needed for wheezing.    . Olopatadine HCl (PATADAY) 0.2 % SOLN Place 1 drop into both eyes every morning.    . potassium chloride SA (K-DUR,KLOR-CON) 20 MEQ tablet Take 1 tablet (20 mEq total) by mouth daily. 30 tablet 6  . pregabalin (LYRICA) 150 MG capsule Take 150 mg by mouth 2 (two) times daily.      . sotalol (BETAPACE) 160 MG tablet Take 1 tablet (160 mg total) by mouth daily. 90 tablet 1  . Tamsulosin HCl (FLOMAX) 0.4 MG CAPS Take 0.4 mg by mouth every morning.     . tiotropium (SPIRIVA) 18 MCG inhalation capsule Place 1 capsule (18 mcg total) into inhaler and inhale every morning. 90 capsule 3  . torsemide (DEMADEX) 20 MG tablet Take 3 tablets (60  mg total) by mouth 2 (two) times daily. 180 tablet 6  . warfarin (COUMADIN) 5 MG tablet Take as directed by coumadin clinic (Patient taking differently: 5-7.5 mg. Tuesday take 5 mg and every day besides Tuesday take 7.5 mg) 150 tablet 0   No current facility-administered medications for this encounter.   BP 110/68 mmHg  Pulse 73  Wt 287 lb 8 oz (130.409 kg)  SpO2 95% General: NAD, obese.  Neck: JVP 7-8 cm, no thyromegaly or thyroid nodule.  Lungs: Clear to auscultation bilaterally with normal respiratory effort. CV: Nondisplaced PMI.  Heart regular S1/S2, no S3/S4, no murmur.  1+ edema 1/2 to knees bilaterally.  No carotid bruit.  Normal pedal pulses.  Abdomen: Soft, nontender, no hepatosplenomegaly, no distention.  Skin: Intact without lesions or rashes.  Neurologic: Alert and oriented x 3.  Psych: Normal affect. Extremities: No clubbing or cyanosis.  HEENT: Normal.   Assessment/Plan: 1. Atrial fibrillation: Paroxysmal.  He is not in atrial fibrillation today.   - Continue sotalol per EP, QTc stable on ECG.  - Continue warfarin.  2. COPD: On home oxygen.  3. Chronic systolic CHF: EF 0000000, nonischemic cardiomyopathy.  MDT CRT-D device.  On exam today and by Optivol, he is not volume overloaded. Weight is down.  NYHA class III symptoms (not very active).  - Continue torsemide 60 mg bid and KCl 20. BMET/BNP today.  - Continue Bidil 1/2 tab tid.  Has lightheaded spells, will not increase today.  - Continue digoxin, check level today.  - No ACEI/ARB for now with elevated creatinine. - Limit sodium and fluid intake.  - Will not add additional beta blocker to sotalol for the time being.  4. CKD: Stage III, creatinine 1.56 most recently.  5. OSA: On CPAP.

## 2015-07-24 DIAGNOSIS — I5043 Acute on chronic combined systolic (congestive) and diastolic (congestive) heart failure: Secondary | ICD-10-CM | POA: Diagnosis not present

## 2015-07-24 DIAGNOSIS — N183 Chronic kidney disease, stage 3 (moderate): Secondary | ICD-10-CM | POA: Diagnosis not present

## 2015-07-24 DIAGNOSIS — I130 Hypertensive heart and chronic kidney disease with heart failure and stage 1 through stage 4 chronic kidney disease, or unspecified chronic kidney disease: Secondary | ICD-10-CM | POA: Diagnosis not present

## 2015-07-24 DIAGNOSIS — J441 Chronic obstructive pulmonary disease with (acute) exacerbation: Secondary | ICD-10-CM | POA: Diagnosis not present

## 2015-07-24 DIAGNOSIS — I480 Paroxysmal atrial fibrillation: Secondary | ICD-10-CM | POA: Diagnosis not present

## 2015-07-24 DIAGNOSIS — G4733 Obstructive sleep apnea (adult) (pediatric): Secondary | ICD-10-CM | POA: Diagnosis not present

## 2015-07-25 DIAGNOSIS — I130 Hypertensive heart and chronic kidney disease with heart failure and stage 1 through stage 4 chronic kidney disease, or unspecified chronic kidney disease: Secondary | ICD-10-CM | POA: Diagnosis not present

## 2015-07-25 DIAGNOSIS — N183 Chronic kidney disease, stage 3 (moderate): Secondary | ICD-10-CM | POA: Diagnosis not present

## 2015-07-25 DIAGNOSIS — G4733 Obstructive sleep apnea (adult) (pediatric): Secondary | ICD-10-CM | POA: Diagnosis not present

## 2015-07-25 DIAGNOSIS — J441 Chronic obstructive pulmonary disease with (acute) exacerbation: Secondary | ICD-10-CM | POA: Diagnosis not present

## 2015-07-25 DIAGNOSIS — I480 Paroxysmal atrial fibrillation: Secondary | ICD-10-CM | POA: Diagnosis not present

## 2015-07-25 DIAGNOSIS — I5043 Acute on chronic combined systolic (congestive) and diastolic (congestive) heart failure: Secondary | ICD-10-CM | POA: Diagnosis not present

## 2015-07-26 ENCOUNTER — Telehealth (HOSPITAL_COMMUNITY): Payer: Self-pay | Admitting: Cardiology

## 2015-07-26 DIAGNOSIS — N183 Chronic kidney disease, stage 3 (moderate): Secondary | ICD-10-CM | POA: Diagnosis not present

## 2015-07-26 DIAGNOSIS — J441 Chronic obstructive pulmonary disease with (acute) exacerbation: Secondary | ICD-10-CM | POA: Diagnosis not present

## 2015-07-26 DIAGNOSIS — I5043 Acute on chronic combined systolic (congestive) and diastolic (congestive) heart failure: Secondary | ICD-10-CM | POA: Diagnosis not present

## 2015-07-26 DIAGNOSIS — I5022 Chronic systolic (congestive) heart failure: Secondary | ICD-10-CM

## 2015-07-26 DIAGNOSIS — I130 Hypertensive heart and chronic kidney disease with heart failure and stage 1 through stage 4 chronic kidney disease, or unspecified chronic kidney disease: Secondary | ICD-10-CM | POA: Diagnosis not present

## 2015-07-26 DIAGNOSIS — G4733 Obstructive sleep apnea (adult) (pediatric): Secondary | ICD-10-CM | POA: Diagnosis not present

## 2015-07-26 DIAGNOSIS — I480 Paroxysmal atrial fibrillation: Secondary | ICD-10-CM | POA: Diagnosis not present

## 2015-07-26 MED ORDER — TORSEMIDE 20 MG PO TABS: 40.0000 mg | ORAL_TABLET | Freq: Two times a day (BID) | ORAL | Status: DC

## 2015-07-26 NOTE — Telephone Encounter (Signed)
Pt aware and voiced understanding Repeat labs 2/6

## 2015-07-26 NOTE — Telephone Encounter (Signed)
-----   Message from Larey Dresser, MD sent at 07/21/2015  5:15 PM EST ----- Decrease torsemide to 40 mg bid and repeat BMET in 1 week.

## 2015-07-27 DIAGNOSIS — I480 Paroxysmal atrial fibrillation: Secondary | ICD-10-CM | POA: Diagnosis not present

## 2015-07-27 DIAGNOSIS — G4733 Obstructive sleep apnea (adult) (pediatric): Secondary | ICD-10-CM | POA: Diagnosis not present

## 2015-07-27 DIAGNOSIS — I130 Hypertensive heart and chronic kidney disease with heart failure and stage 1 through stage 4 chronic kidney disease, or unspecified chronic kidney disease: Secondary | ICD-10-CM | POA: Diagnosis not present

## 2015-07-27 DIAGNOSIS — I5043 Acute on chronic combined systolic (congestive) and diastolic (congestive) heart failure: Secondary | ICD-10-CM | POA: Diagnosis not present

## 2015-07-27 DIAGNOSIS — N183 Chronic kidney disease, stage 3 (moderate): Secondary | ICD-10-CM | POA: Diagnosis not present

## 2015-07-27 DIAGNOSIS — J441 Chronic obstructive pulmonary disease with (acute) exacerbation: Secondary | ICD-10-CM | POA: Diagnosis not present

## 2015-07-31 ENCOUNTER — Encounter: Payer: Self-pay | Admitting: Cardiology

## 2015-07-31 DIAGNOSIS — J441 Chronic obstructive pulmonary disease with (acute) exacerbation: Secondary | ICD-10-CM | POA: Diagnosis not present

## 2015-07-31 DIAGNOSIS — I130 Hypertensive heart and chronic kidney disease with heart failure and stage 1 through stage 4 chronic kidney disease, or unspecified chronic kidney disease: Secondary | ICD-10-CM | POA: Diagnosis not present

## 2015-07-31 DIAGNOSIS — I5043 Acute on chronic combined systolic (congestive) and diastolic (congestive) heart failure: Secondary | ICD-10-CM | POA: Diagnosis not present

## 2015-07-31 DIAGNOSIS — I480 Paroxysmal atrial fibrillation: Secondary | ICD-10-CM | POA: Diagnosis not present

## 2015-07-31 DIAGNOSIS — G4733 Obstructive sleep apnea (adult) (pediatric): Secondary | ICD-10-CM | POA: Diagnosis not present

## 2015-07-31 DIAGNOSIS — N183 Chronic kidney disease, stage 3 (moderate): Secondary | ICD-10-CM | POA: Diagnosis not present

## 2015-08-01 DIAGNOSIS — I480 Paroxysmal atrial fibrillation: Secondary | ICD-10-CM | POA: Diagnosis not present

## 2015-08-01 DIAGNOSIS — G4733 Obstructive sleep apnea (adult) (pediatric): Secondary | ICD-10-CM | POA: Diagnosis not present

## 2015-08-01 DIAGNOSIS — J441 Chronic obstructive pulmonary disease with (acute) exacerbation: Secondary | ICD-10-CM | POA: Diagnosis not present

## 2015-08-01 DIAGNOSIS — I130 Hypertensive heart and chronic kidney disease with heart failure and stage 1 through stage 4 chronic kidney disease, or unspecified chronic kidney disease: Secondary | ICD-10-CM | POA: Diagnosis not present

## 2015-08-01 DIAGNOSIS — I5043 Acute on chronic combined systolic (congestive) and diastolic (congestive) heart failure: Secondary | ICD-10-CM | POA: Diagnosis not present

## 2015-08-01 DIAGNOSIS — N183 Chronic kidney disease, stage 3 (moderate): Secondary | ICD-10-CM | POA: Diagnosis not present

## 2015-08-02 DIAGNOSIS — I480 Paroxysmal atrial fibrillation: Secondary | ICD-10-CM | POA: Diagnosis not present

## 2015-08-02 DIAGNOSIS — G4733 Obstructive sleep apnea (adult) (pediatric): Secondary | ICD-10-CM | POA: Diagnosis not present

## 2015-08-02 DIAGNOSIS — I130 Hypertensive heart and chronic kidney disease with heart failure and stage 1 through stage 4 chronic kidney disease, or unspecified chronic kidney disease: Secondary | ICD-10-CM | POA: Diagnosis not present

## 2015-08-02 DIAGNOSIS — N183 Chronic kidney disease, stage 3 (moderate): Secondary | ICD-10-CM | POA: Diagnosis not present

## 2015-08-02 DIAGNOSIS — I5043 Acute on chronic combined systolic (congestive) and diastolic (congestive) heart failure: Secondary | ICD-10-CM | POA: Diagnosis not present

## 2015-08-02 DIAGNOSIS — J441 Chronic obstructive pulmonary disease with (acute) exacerbation: Secondary | ICD-10-CM | POA: Diagnosis not present

## 2015-08-03 ENCOUNTER — Ambulatory Visit (HOSPITAL_COMMUNITY)
Admission: RE | Admit: 2015-08-03 | Discharge: 2015-08-03 | Disposition: A | Discharge status: Home / Self Care | Payer: Medicare Other | Source: Ambulatory Visit | Attending: Internal Medicine | Admitting: Internal Medicine

## 2015-08-03 VITALS — BP 118/90 | HR 97 | Wt 284.0 lb

## 2015-08-03 DIAGNOSIS — I442 Atrioventricular block, complete: Secondary | ICD-10-CM | POA: Insufficient documentation

## 2015-08-03 DIAGNOSIS — I5043 Acute on chronic combined systolic (congestive) and diastolic (congestive) heart failure: Secondary | ICD-10-CM

## 2015-08-03 DIAGNOSIS — I130 Hypertensive heart and chronic kidney disease with heart failure and stage 1 through stage 4 chronic kidney disease, or unspecified chronic kidney disease: Secondary | ICD-10-CM | POA: Diagnosis not present

## 2015-08-03 DIAGNOSIS — G4733 Obstructive sleep apnea (adult) (pediatric): Secondary | ICD-10-CM | POA: Diagnosis not present

## 2015-08-03 DIAGNOSIS — Z79899 Other long term (current) drug therapy: Secondary | ICD-10-CM | POA: Insufficient documentation

## 2015-08-03 DIAGNOSIS — I5022 Chronic systolic (congestive) heart failure: Secondary | ICD-10-CM | POA: Insufficient documentation

## 2015-08-03 DIAGNOSIS — Z9981 Dependence on supplemental oxygen: Secondary | ICD-10-CM | POA: Diagnosis not present

## 2015-08-03 DIAGNOSIS — N183 Chronic kidney disease, stage 3 unspecified: Secondary | ICD-10-CM

## 2015-08-03 DIAGNOSIS — K219 Gastro-esophageal reflux disease without esophagitis: Secondary | ICD-10-CM | POA: Diagnosis not present

## 2015-08-03 DIAGNOSIS — Z95810 Presence of automatic (implantable) cardiac defibrillator: Secondary | ICD-10-CM | POA: Insufficient documentation

## 2015-08-03 DIAGNOSIS — Z87891 Personal history of nicotine dependence: Secondary | ICD-10-CM | POA: Insufficient documentation

## 2015-08-03 DIAGNOSIS — Z7901 Long term (current) use of anticoagulants: Secondary | ICD-10-CM | POA: Insufficient documentation

## 2015-08-03 DIAGNOSIS — I428 Other cardiomyopathies: Secondary | ICD-10-CM | POA: Insufficient documentation

## 2015-08-03 DIAGNOSIS — J449 Chronic obstructive pulmonary disease, unspecified: Secondary | ICD-10-CM | POA: Insufficient documentation

## 2015-08-03 DIAGNOSIS — I480 Paroxysmal atrial fibrillation: Secondary | ICD-10-CM | POA: Diagnosis not present

## 2015-08-03 DIAGNOSIS — I519 Heart disease, unspecified: Secondary | ICD-10-CM

## 2015-08-03 DIAGNOSIS — Z86718 Personal history of other venous thrombosis and embolism: Secondary | ICD-10-CM | POA: Insufficient documentation

## 2015-08-03 LAB — BASIC METABOLIC PANEL
Anion gap: 10 (ref 5–15)
BUN: 36 mg/dL — AB (ref 6–20)
CALCIUM: 9.6 mg/dL (ref 8.9–10.3)
CHLORIDE: 105 mmol/L (ref 101–111)
CO2: 28 mmol/L (ref 22–32)
CREATININE: 2.24 mg/dL — AB (ref 0.61–1.24)
GFR calc non Af Amer: 26 mL/min — ABNORMAL LOW (ref >60)
GFR, EST AFRICAN AMERICAN: 30 mL/min — AB (ref >60)
Glucose, Bld: 87 mg/dL (ref 65–99)
Potassium: 4.3 mmol/L (ref 3.5–5.1)
SODIUM: 143 mmol/L (ref 135–145)

## 2015-08-03 NOTE — Patient Instructions (Signed)
No medication changes  Your physician recommends that you schedule a follow-up appointment in: 4 weeks with Dr.McLean  Do the following things EVERYDAY: 1) Weigh yourself in the morning before breakfast. Write it down and keep it in a log. 2) Take your medicines as prescribed 3) Eat low salt foods-Limit salt (sodium) to 2000 mg per day.  4) Stay as active as you can everyday 5) Limit all fluids for the day to less than 2 liters 6)

## 2015-08-03 NOTE — Progress Notes (Signed)
Patient ID: Maurice Delgado, male   DOB: 10-03-1933, 82 y.o.   MRN: [id number]  PCP: Dr. Jonelle Sidle HF Cardiology: Dr Aundra Dubin  82 yo with history of chronic systolic CHF due to NICM, CHB s/p MDT CRT-D 2002 with gen change 2011, HTN, history of DVT on chronic anticoag with coumadin, COPD, PAF, CKD stage III and hx of OSA who was admitted from [place] [facility] in 1/17 for weight gain and dyspnea.  He was thought to be volume overloaded and was diuresed.    He returns for followup.  Ongoing dyspnea with exertion. Uses and electric cart at the grocery store. Weight at home 277 pounds.  Increased appetite.  Limited activity. Says he has doesn't want to walk around much.  Followed by [facility]. He continues to sleep in his recliner.  No chest pain.  No palpitations. Taking all medications.    Optivol assessed: Fluid index < threshold, impedance stable. Activity ~1 hour per day.   Labs (1/17): K 3.6, creatinine 1.56 Labs 07/20/2015: K 4.5 Creatinine 2.33   PMH: 1. Sciatica 2. H/o DVT: Recurrent.  Has IVC filter 3. Complete heart block: Medtronic CRT-D device placed 2002.  4. Atrial fibrillation: Paroxysmal. On sotalol to maintain NSR.  5. OSA: CPAP.  6. Chronic systolic CHF: Echo (123456) with EF 20-25%, mildly dilated LV, normal RV size and systolic function, mild MR.  Nonischemic cardiomyopathy.  Has MDT CRT-D device.  7. COPD: On home oxygen.  8. History of retroperitoneal hemorrhage.  9. CKD: Stage III.  10. Gout 11. GERD 12. Atrial tachycardia: s/p ablation 2014.   Social History   Social History  . Marital Status: Married    Spouse Name: N/A  . Number of Children: N/A  . Years of Education: N/A   Occupational History  . reitred     from education   Social History Main Topics  . Smoking status: Former Smoker -- 1.50 packs/day for 30 years    Types: Cigarettes  . Smokeless tobacco: Never Used     Comment: 06/21/2013 "quit smoking ~ 1986"  . Alcohol Use: No     Comment: 06/21/2013 "quit drinking ~  1970"  . Drug Use: Yes    Special: Marijuana     Comment: 06/21/2013 "stopped using marijuana ~ 1970"  . Sexual Activity: Not Currently   Other Topics Concern  . Not on file   Social History Narrative   Married and has 1 son.    Lives in [place].  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   Family History  Problem Relation Age of Onset  . Heart disease Mother     mother also had Rheumatism.  Marland Kitchen Heart attack Neg Hx   . Stroke Neg Hx   . Hypertension Mother    ROS: All systems reviewed and negative except as per HPI.   Current Outpatient Prescriptions  Medication Sig Dispense Refill  . Ascorbic Acid (VITAMIN C) 1000 MG tablet Take 1,000 mg by mouth every morning.     . budesonide-formoterol (SYMBICORT) 160-4.5 MCG/ACT inhaler Inhale 2 puffs into the lungs 2 (two) times daily. 3 Inhaler 1  . calcitRIOL (ROCALTROL) 0.5 MCG capsule Take 0.5 mcg by mouth every other day.     Marland Kitchen DIGITEK 125 MCG tablet TAKE 1 TABLET EVERY MORNING 90 tablet 0  . Febuxostat (ULORIC) 80 MG TABS Take 80 mg by mouth every morning.     . isosorbide-hydrALAZINE (BIDIL) 20-37.5 MG tablet Take 0.5 tablets by mouth 3 (three) times daily. 45 tablet 6  .  levalbuterol (XOPENEX HFA) 45 MCG/ACT inhaler Inhale 2 puffs into the lungs every 6 (six) hours as needed for wheezing.    . Olopatadine HCl (PATADAY) 0.2 % SOLN Place 1 drop into both eyes every morning.    . potassium chloride SA (K-DUR,KLOR-CON) 20 MEQ tablet Take 1 tablet (20 mEq total) by mouth daily. 30 tablet 6  . pregabalin (LYRICA) 150 MG capsule Take 150 mg by mouth 2 (two) times daily.      . sotalol (BETAPACE) 160 MG tablet Take 1 tablet (160 mg total) by mouth daily. 90 tablet 1  . Tamsulosin HCl (FLOMAX) 0.4 MG CAPS Take 0.4 mg by mouth every morning.     . tiotropium (SPIRIVA) 18 MCG inhalation capsule Place 1 capsule (18 mcg total) into inhaler and inhale every morning. 90 capsule 3  . torsemide (DEMADEX) 20 MG tablet Take 2 tablets (40 mg total) by mouth 2 (two)  times daily. 120 tablet 6  . warfarin (COUMADIN) 5 MG tablet Take as directed by coumadin clinic (Patient taking differently: 5-7.5 mg. Tuesday take  5 mg and every day besides Tuesday take 7.5 mg) 150 tablet 0   No current facility-administered medications for this encounter.   BP 118/90 mmHg  Pulse 97  Wt 284 lb (128.822 kg)  SpO2 90% General: NAD, obese.  Neck: JVP 5-6 cm, no thyromegaly or thyroid nodule.  Lungs: Clear to auscultation bilaterally with normal respiratory effort. CV: Nondisplaced PMI.  Heart regular S1/S2, no S3/S4, no murmur.  RLE and LLE Trace edema  No carotid bruit.  Normal pedal pulses.  Abdomen: Soft, nontender, no hepatosplenomegaly, no distention.  Skin: Intact without lesions or rashes.  Neurologic: Alert and oriented x 3.  Psych: Normal affect. Extremities: No clubbing or cyanosis.  HEENT: Normal.   Assessment/Plan: 1. Atrial fibrillation: Paroxysmal.   - Continue sotalol per EP - Continue warfarin.  2. COPD: On home oxygen.  3. Chronic systolic CHF: EF 0000000, nonischemic cardiomyopathy.  MDT CRT-D device.  On exam today and by Optivol, he is not volume overloaded. Weight is down.  NYHA class III symptoms . Limited activit y ~ 1 hour per day.  - Continue torsemide 60 mg bid and KCl 20. BMET/BNP today.  - Continue Bidil 1/2 tab tid.  Has lightheaded spells, will not increase today. He is not orthostatic.  - Continue digoxin, 0.5 07/2015   - No ACEI/ARB for now with elevated creatinine. - Will not add additional beta blocker to sotalol for the time being.  4. CKD: Stage III, creatinine  2.33 which was above his baseline creatinine . Check BMET today. most recently.  5. OSA: On CPAP.   Follow up in 4 weeks with Dr Aundra Dubin.    Lou Irigoyen NP-C  8:25 PM

## 2015-08-07 ENCOUNTER — Other Ambulatory Visit (HOSPITAL_COMMUNITY): Payer: Medicare Other

## 2015-08-07 DIAGNOSIS — I130 Hypertensive heart and chronic kidney disease with heart failure and stage 1 through stage 4 chronic kidney disease, or unspecified chronic kidney disease: Secondary | ICD-10-CM | POA: Diagnosis not present

## 2015-08-07 DIAGNOSIS — N183 Chronic kidney disease, stage 3 (moderate): Secondary | ICD-10-CM | POA: Diagnosis not present

## 2015-08-07 DIAGNOSIS — J441 Chronic obstructive pulmonary disease with (acute) exacerbation: Secondary | ICD-10-CM | POA: Diagnosis not present

## 2015-08-07 DIAGNOSIS — I480 Paroxysmal atrial fibrillation: Secondary | ICD-10-CM | POA: Diagnosis not present

## 2015-08-07 DIAGNOSIS — I5043 Acute on chronic combined systolic (congestive) and diastolic (congestive) heart failure: Secondary | ICD-10-CM | POA: Diagnosis not present

## 2015-08-07 DIAGNOSIS — G4733 Obstructive sleep apnea (adult) (pediatric): Secondary | ICD-10-CM | POA: Diagnosis not present

## 2015-08-08 ENCOUNTER — Telehealth (HOSPITAL_COMMUNITY): Payer: Self-pay | Admitting: Cardiology

## 2015-08-08 DIAGNOSIS — G4733 Obstructive sleep apnea (adult) (pediatric): Secondary | ICD-10-CM | POA: Diagnosis not present

## 2015-08-08 DIAGNOSIS — N183 Chronic kidney disease, stage 3 (moderate): Secondary | ICD-10-CM | POA: Diagnosis not present

## 2015-08-08 DIAGNOSIS — I130 Hypertensive heart and chronic kidney disease with heart failure and stage 1 through stage 4 chronic kidney disease, or unspecified chronic kidney disease: Secondary | ICD-10-CM | POA: Diagnosis not present

## 2015-08-08 DIAGNOSIS — I5022 Chronic systolic (congestive) heart failure: Secondary | ICD-10-CM

## 2015-08-08 DIAGNOSIS — I480 Paroxysmal atrial fibrillation: Secondary | ICD-10-CM | POA: Diagnosis not present

## 2015-08-08 DIAGNOSIS — J441 Chronic obstructive pulmonary disease with (acute) exacerbation: Secondary | ICD-10-CM | POA: Diagnosis not present

## 2015-08-08 DIAGNOSIS — I5043 Acute on chronic combined systolic (congestive) and diastolic (congestive) heart failure: Secondary | ICD-10-CM | POA: Diagnosis not present

## 2015-08-08 MED ORDER — TORSEMIDE 20 MG PO TABS: 40.0000 mg | ORAL_TABLET | Freq: Every day | ORAL | Status: DC

## 2015-08-08 NOTE — Telephone Encounter (Signed)
Pt aware to hold torsemide x 2 days and restart @ 40 mg daily

## 2015-08-09 ENCOUNTER — Telehealth: Payer: Self-pay | Admitting: *Deleted

## 2015-08-09 DIAGNOSIS — I10 Essential (primary) hypertension: Secondary | ICD-10-CM | POA: Diagnosis not present

## 2015-08-09 DIAGNOSIS — I509 Heart failure, unspecified: Secondary | ICD-10-CM | POA: Diagnosis not present

## 2015-08-09 DIAGNOSIS — E119 Type 2 diabetes mellitus without complications: Secondary | ICD-10-CM | POA: Diagnosis not present

## 2015-08-09 DIAGNOSIS — E669 Obesity, unspecified: Secondary | ICD-10-CM | POA: Diagnosis not present

## 2015-08-09 NOTE — Telephone Encounter (Signed)
Pt called stating that he missed dose of coumadin on last Friday Feb 3rd and has been taking it as ordered since then . Pt instructed to continue to take coumadin as directed 7.5mg  daily except 5mg  on Tuesdays since it has been almost a week since he missed dose and he states understanidng

## 2015-08-17 DIAGNOSIS — I5043 Acute on chronic combined systolic (congestive) and diastolic (congestive) heart failure: Secondary | ICD-10-CM | POA: Diagnosis not present

## 2015-08-17 DIAGNOSIS — N183 Chronic kidney disease, stage 3 (moderate): Secondary | ICD-10-CM | POA: Diagnosis not present

## 2015-08-17 DIAGNOSIS — G4733 Obstructive sleep apnea (adult) (pediatric): Secondary | ICD-10-CM | POA: Diagnosis not present

## 2015-08-17 DIAGNOSIS — I480 Paroxysmal atrial fibrillation: Secondary | ICD-10-CM | POA: Diagnosis not present

## 2015-08-17 DIAGNOSIS — I130 Hypertensive heart and chronic kidney disease with heart failure and stage 1 through stage 4 chronic kidney disease, or unspecified chronic kidney disease: Secondary | ICD-10-CM | POA: Diagnosis not present

## 2015-08-17 DIAGNOSIS — J441 Chronic obstructive pulmonary disease with (acute) exacerbation: Secondary | ICD-10-CM | POA: Diagnosis not present

## 2015-08-22 ENCOUNTER — Other Ambulatory Visit: Payer: Self-pay | Admitting: Infectious Diseases

## 2015-08-22 DIAGNOSIS — G4733 Obstructive sleep apnea (adult) (pediatric): Secondary | ICD-10-CM | POA: Diagnosis not present

## 2015-08-22 DIAGNOSIS — N183 Chronic kidney disease, stage 3 (moderate): Secondary | ICD-10-CM | POA: Diagnosis not present

## 2015-08-22 DIAGNOSIS — J441 Chronic obstructive pulmonary disease with (acute) exacerbation: Secondary | ICD-10-CM | POA: Diagnosis not present

## 2015-08-22 DIAGNOSIS — I5022 Chronic systolic (congestive) heart failure: Secondary | ICD-10-CM

## 2015-08-22 DIAGNOSIS — I130 Hypertensive heart and chronic kidney disease with heart failure and stage 1 through stage 4 chronic kidney disease, or unspecified chronic kidney disease: Secondary | ICD-10-CM | POA: Diagnosis not present

## 2015-08-22 DIAGNOSIS — I5043 Acute on chronic combined systolic (congestive) and diastolic (congestive) heart failure: Secondary | ICD-10-CM | POA: Diagnosis not present

## 2015-08-22 DIAGNOSIS — I480 Paroxysmal atrial fibrillation: Secondary | ICD-10-CM | POA: Diagnosis not present

## 2015-08-22 MED ORDER — POTASSIUM CHLORIDE CRYS ER 20 MEQ PO TBCR: 20.0000 meq | EXTENDED_RELEASE_TABLET | Freq: Every day | ORAL | Status: DC

## 2015-08-22 MED ORDER — TORSEMIDE 20 MG PO TABS: 40.0000 mg | ORAL_TABLET | Freq: Every day | ORAL | Status: DC

## 2015-08-22 MED ORDER — ISOSORB DINITRATE-HYDRALAZINE 20-37.5 MG PO TABS: 0.5000 | ORAL_TABLET | Freq: Three times a day (TID) | ORAL | Status: DC

## 2015-08-22 NOTE — Telephone Encounter (Signed)
Called about being out of medications--wants to have PCP send Rx to preferred mail order pharmacy. I advised that he would have to speak with his PCP about that if he prefers it.   New Rx w/ refills sent for torsemide, potassium and bidil, although existing prescriptions are reflected in our computer with recent refills on 08/08/15.   Advised to call pharmacy to determine if they have refills on file.

## 2015-08-23 ENCOUNTER — Other Ambulatory Visit (HOSPITAL_COMMUNITY): Payer: Self-pay | Admitting: *Deleted

## 2015-08-31 ENCOUNTER — Ambulatory Visit (INDEPENDENT_AMBULATORY_CARE_PROVIDER_SITE_OTHER): Payer: Medicare Other | Admitting: *Deleted

## 2015-08-31 DIAGNOSIS — Z5181 Encounter for therapeutic drug level monitoring: Secondary | ICD-10-CM

## 2015-08-31 DIAGNOSIS — I480 Paroxysmal atrial fibrillation: Secondary | ICD-10-CM | POA: Diagnosis not present

## 2015-08-31 LAB — POCT INR: INR: 2

## 2015-09-01 ENCOUNTER — Other Ambulatory Visit (HOSPITAL_COMMUNITY): Payer: Self-pay | Admitting: *Deleted

## 2015-09-01 ENCOUNTER — Telehealth: Payer: Self-pay | Admitting: Cardiology

## 2015-09-01 DIAGNOSIS — I5022 Chronic systolic (congestive) heart failure: Secondary | ICD-10-CM

## 2015-09-01 MED ORDER — TORSEMIDE 20 MG PO TABS: 40.0000 mg | ORAL_TABLET | Freq: Every day | ORAL | Status: DC

## 2015-09-01 MED ORDER — ISOSORB DINITRATE-HYDRALAZINE 20-37.5 MG PO TABS: 0.5000 | ORAL_TABLET | Freq: Three times a day (TID) | ORAL | Status: DC

## 2015-09-01 MED ORDER — POTASSIUM CHLORIDE CRYS ER 20 MEQ PO TBCR: 20.0000 meq | EXTENDED_RELEASE_TABLET | Freq: Every day | ORAL | Status: DC

## 2015-09-01 NOTE — Telephone Encounter (Signed)
Returned call left message for them to call us back.

## 2015-09-01 NOTE — Telephone Encounter (Signed)
Benjamine Mola is calling from [facility] to speak about Mr. Ishag about his medications .Marland Kitchen Please call at [phone]

## 2015-09-01 NOTE — Telephone Encounter (Signed)
Elizabeth from Dr. Leontine Locket office calling wanting to know if they could refill; Torsemide, Bidil and K+.  Pt would like all of his meds to be regulated by one physician.  Suggested that Dr. Aundra Dubin should be the physician to refill due to his CHF.  Will send refill's for Tor[facility]ide, Bidil [facility]  They will let pt know that these 3 will be done by Dr. Aundra Dubin.

## 2015-09-01 NOTE — Telephone Encounter (Signed)
Please call,she needs to talk to you about his medicine. °

## 2015-09-01 NOTE — Telephone Encounter (Signed)
His Dr stepped out./ I will try to call back in an hour,

## 2015-09-05 ENCOUNTER — Ambulatory Visit (HOSPITAL_COMMUNITY)
Admission: RE | Admit: 2015-09-05 | Discharge: 2015-09-05 | Disposition: A | Discharge status: Home / Self Care | Payer: Medicare Other | Source: Ambulatory Visit | Attending: Cardiology | Admitting: Cardiology

## 2015-09-05 VITALS — BP 118/78 | HR 74 | Wt 276.8 lb

## 2015-09-05 DIAGNOSIS — I442 Atrioventricular block, complete: Secondary | ICD-10-CM | POA: Diagnosis not present

## 2015-09-05 DIAGNOSIS — K219 Gastro-esophageal reflux disease without esophagitis: Secondary | ICD-10-CM | POA: Diagnosis not present

## 2015-09-05 DIAGNOSIS — Z79899 Other long term (current) drug therapy: Secondary | ICD-10-CM | POA: Insufficient documentation

## 2015-09-05 DIAGNOSIS — I480 Paroxysmal atrial fibrillation: Secondary | ICD-10-CM | POA: Insufficient documentation

## 2015-09-05 DIAGNOSIS — Z7901 Long term (current) use of anticoagulants: Secondary | ICD-10-CM | POA: Diagnosis not present

## 2015-09-05 DIAGNOSIS — R0600 Dyspnea, unspecified: Secondary | ICD-10-CM | POA: Diagnosis not present

## 2015-09-05 DIAGNOSIS — G4733 Obstructive sleep apnea (adult) (pediatric): Secondary | ICD-10-CM | POA: Insufficient documentation

## 2015-09-05 DIAGNOSIS — Z7951 Long term (current) use of inhaled steroids: Secondary | ICD-10-CM | POA: Insufficient documentation

## 2015-09-05 DIAGNOSIS — Z95810 Presence of automatic (implantable) cardiac defibrillator: Secondary | ICD-10-CM | POA: Insufficient documentation

## 2015-09-05 DIAGNOSIS — N183 Chronic kidney disease, stage 3 (moderate): Secondary | ICD-10-CM | POA: Diagnosis not present

## 2015-09-05 DIAGNOSIS — I519 Heart disease, unspecified: Secondary | ICD-10-CM

## 2015-09-05 DIAGNOSIS — Z86718 Personal history of other venous thrombosis and embolism: Secondary | ICD-10-CM | POA: Diagnosis not present

## 2015-09-05 DIAGNOSIS — Z87891 Personal history of nicotine dependence: Secondary | ICD-10-CM | POA: Insufficient documentation

## 2015-09-05 DIAGNOSIS — Z8249 Family history of ischemic heart disease and other diseases of the circulatory system: Secondary | ICD-10-CM | POA: Insufficient documentation

## 2015-09-05 DIAGNOSIS — M109 Gout, unspecified: Secondary | ICD-10-CM | POA: Insufficient documentation

## 2015-09-05 DIAGNOSIS — Z9981 Dependence on supplemental oxygen: Secondary | ICD-10-CM | POA: Diagnosis not present

## 2015-09-05 DIAGNOSIS — I5022 Chronic systolic (congestive) heart failure: Secondary | ICD-10-CM

## 2015-09-05 DIAGNOSIS — J449 Chronic obstructive pulmonary disease, unspecified: Secondary | ICD-10-CM | POA: Diagnosis not present

## 2015-09-05 DIAGNOSIS — I428 Other cardiomyopathies: Secondary | ICD-10-CM | POA: Diagnosis not present

## 2015-09-05 LAB — BASIC METABOLIC PANEL
Anion gap: 8 (ref 5–15)
BUN: 25 mg/dL — AB (ref 6–20)
CHLORIDE: 107 mmol/L (ref 101–111)
CO2: 29 mmol/L (ref 22–32)
CREATININE: 1.73 mg/dL — AB (ref 0.61–1.24)
Calcium: 9.3 mg/dL (ref 8.9–10.3)
GFR calc Af Amer: 41 mL/min — ABNORMAL LOW (ref >60)
GFR calc non Af Amer: 36 mL/min — ABNORMAL LOW (ref >60)
Glucose, Bld: 103 mg/dL — ABNORMAL HIGH (ref 65–99)
Potassium: 4.4 mmol/L (ref 3.5–5.1)
SODIUM: 144 mmol/L (ref 135–145)

## 2015-09-05 LAB — CBC
HEMATOCRIT: 40.9 % (ref 39.0–52.0)
HEMOGLOBIN: 12.5 g/dL — AB (ref 13.0–17.0)
MCH: 26.6 pg (ref 26.0–34.0)
MCHC: 30.6 g/dL (ref 30.0–36.0)
MCV: 87 fL (ref 78.0–100.0)
Platelets: 110 10*3/uL — ABNORMAL LOW (ref 150–400)
RBC: 4.7 MIL/uL (ref 4.22–5.81)
RDW: 15.1 % (ref 11.5–15.5)
WBC: 3.8 10*3/uL — ABNORMAL LOW (ref 4.0–10.5)

## 2015-09-05 LAB — BRAIN NATRIURETIC PEPTIDE: B Natriuretic Peptide: 266.9 pg/mL — ABNORMAL HIGH (ref 0.0–100.0)

## 2015-09-05 LAB — DIGOXIN LEVEL: Digoxin Level: 0.5 ng/mL — ABNORMAL LOW (ref 0.8–2.0)

## 2015-09-05 MED ORDER — POTASSIUM CHLORIDE CRYS ER 20 MEQ PO TBCR: 20.0000 meq | EXTENDED_RELEASE_TABLET | Freq: Every day | ORAL | Status: DC

## 2015-09-05 MED ORDER — CARVEDILOL 3.125 MG PO TABS: 3.1250 mg | ORAL_TABLET | Freq: Two times a day (BID) | ORAL | Status: DC

## 2015-09-05 MED ORDER — TORSEMIDE 20 MG PO TABS: 40.0000 mg | ORAL_TABLET | Freq: Every day | ORAL | Status: DC

## 2015-09-05 NOTE — Patient Instructions (Signed)
Start Carvedilol 3.125 mg Twice daily   Labs today  Your physician recommends that you schedule a follow-up appointment in: 6 weeks

## 2015-09-06 ENCOUNTER — Telehealth (HOSPITAL_COMMUNITY): Payer: Self-pay | Admitting: Cardiology

## 2015-09-06 NOTE — Progress Notes (Signed)
Patient ID: Maurice Delgado, male   DOB: 22-Sep-1933, 82 y.o.   MRN: [id number]  PCP: Dr. Jonelle Sidle HF Cardiology: Dr Aundra Dubin  82 yo with history of chronic systolic CHF due to NICM, CHB s/p Medtronic CRT-D 2002 with generator change 2011, HTN, history of DVT on chronic anticoag with coumadin, COPD, PAF, CKD stage III and hx of OSA who was admitted from [place] [facility] in 1/17 for weight gain and dyspnea.  He was thought to be volume overloaded and was diuresed.    He returns for followup.  Generally doing ok.  Still using home oxygen but not using CPAP now.  Walks with walker, no dyspnea on flat ground.  Able to walk around grocery store.  Dyspnea only with "rushing" or walking up a flight of stairs.  No palpitations, no chest pain.   Weight is down 8 lbs.   Optivol assessed: Fluid index < threshold, impedance stable. No atrial fibrillation.   ECG: a-BiV sequential pacing, QTc 506 msec  Labs (1/17): K 3.6, creatinine 1.56 Labs 07/20/2015: K 4.5 Creatinine 2.33, digoxin 0.5  Labs (2/17): K 4.3, creatinine 2.24  PMH: 1. Sciatica 2. H/o DVT: Recurrent.  Has IVC filter 3. Complete heart block: Medtronic CRT-D device placed 2002.  4. Atrial fibrillation: Paroxysmal. On sotalol to maintain NSR.  5. OSA: No longer uses CPAP.  6. Chronic systolic CHF: Echo (123456) with EF 20-25%, mildly dilated LV, normal RV size and systolic function, mild MR.  Nonischemic cardiomyopathy.  Has MDT CRT-D device.  7. COPD: On home oxygen.  8. History of retroperitoneal hemorrhage.  9. CKD: Stage III.  10. Gout 11. GERD 12. Atrial tachycardia: s/p ablation 2014.   Social History   Social History  . Marital Status: Married    Spouse Name: N/A  . Number of Children: N/A  . Years of Education: N/A   Occupational History  . reitred     from education   Social History Main Topics  . Smoking status: Former Smoker -- 1.50 packs/day for 30 years    Types: Cigarettes  . Smokeless tobacco: Never Used     Comment:  06/21/2013 "quit smoking ~ 1986"  . Alcohol Use: No     Comment: 06/21/2013 "quit drinking ~ 1970"  . Drug Use: Yes    Special: Marijuana     Comment: 06/21/2013 "stopped using marijuana ~ 1970"  . Sexual Activity: Not Currently   Other Topics Concern  . Not on file   Social History Narrative   Married and has 1 son.    Lives in [place].  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   Family History  Problem Relation Age of Onset  . Heart disease Mother     mother also had Rheumatism.  Marland Kitchen Heart attack Neg Hx   . Stroke Neg Hx   . Hypertension Mother    ROS: All systems reviewed and negative except as per HPI.   Current Outpatient Prescriptions  Medication Sig Dispense Refill  . Ascorbic Acid (VITAMIN C) 1000 MG tablet Take 1,000 mg by mouth every morning.     . budesonide-formoterol (SYMBICORT) 160-4.5 MCG/ACT inhaler Inhale 2 puffs into the lungs 2 (two) times daily. 3 Inhaler 1  . calcitRIOL (ROCALTROL) 0.5 MCG capsule Take 0.5 mcg by mouth every other day.     Marland Kitchen DIGITEK 125 MCG tablet TAKE 1 TABLET EVERY MORNING 90 tablet 0  . Febuxostat (ULORIC) 80 MG TABS Take 80 mg by mouth every morning.     Marland Kitchen  isosorbide-hydrALAZINE (BIDIL) 20-37.5 MG tablet Take 0.5 tablets by mouth 3 (three) times daily. 45 tablet 3  . levalbuterol (XOPENEX HFA) 45 MCG/ACT inhaler Inhale 2 puffs into the lungs every 6 (six) hours as needed for wheezing.    . Olopatadine HCl (PATADAY) 0.2 % SOLN Place 1 drop into both eyes every morning.    . potassium chloride SA (K-DUR,KLOR-CON) 20 MEQ tablet Take 1 tablet (20 mEq total) by mouth daily. 10 tablet 0  . pregabalin (LYRICA) 150 MG capsule Take 150 mg by mouth 2 (two) times daily.      . sotalol (BETAPACE) 160 MG tablet Take 1 tablet (160 mg total) by mouth daily. 90 tablet 1  . Tamsulosin HCl (FLOMAX) 0.4 MG CAPS Take 0.4 mg by mouth every morning.     . tiotropium (SPIRIVA) 18 MCG inhalation capsule Place 1 capsule (18 mcg total) into inhaler and inhale every morning.  90 capsule 3  . torsemide (DEMADEX) 20 MG tablet Take 2 tablets (40 mg total) by mouth daily. 10 tablet 0  . warfarin (COUMADIN) 5 MG tablet Take as directed by coumadin clinic (Patient taking differently: 5-7.5 mg. Tuesday take  5 mg and every day besides Tuesday take 7.5 mg) 150 tablet 0  . carvedilol (COREG) 3.125 MG tablet Take 1 tablet (3.125 mg total) by mouth 2 (two) times daily. 60 tablet 3   No current facility-administered medications for this encounter.   BP 118/78 mmHg  Pulse 74  Wt 276 lb 12 oz (125.533 kg)  SpO2 93% General: NAD, obese.  Neck: JVP 5-6 cm, no thyromegaly or thyroid nodule.  Lungs: Clear to auscultation bilaterally with normal respiratory effort. CV: Nondisplaced PMI.  Heart regular S1/S2, no S3/S4, no murmur.  1+ ankle edema.  No carotid bruit.  Normal pedal pulses.  Abdomen: Soft, nontender, no hepatosplenomegaly, no distention.  Skin: Intact without lesions or rashes.  Neurologic: Alert and oriented x 3.  Psych: Normal affect. Extremities: No clubbing or cyanosis.  HEENT: Normal.   Assessment/Plan: 1. Atrial fibrillation: Paroxysmal.   - Continue sotalol per EP, paced QTc on ECG today is acceptable. BMET today.  - Continue warfarin.  2. COPD: On home oxygen.  3. Chronic systolic CHF: EF 0000000, nonischemic cardiomyopathy.  MDT CRT-D device.  On exam today and by Optivol, he is not volume overloaded. Weight is down.  NYHA class III symptoms.  - Continue torsemide 60 mg bid and KCl 20. BMET/BNP today.  - Continue Bidil 1/2 tab tid.  - Continue digoxin, check level today. - No ACEI/ARB for now with elevated creatinine. - Add Coreg 3.125 mg bid since HR in 70s.  4. CKD: Stage III .  Check BMET today.    Follow up in 6 wks.     Loralie Champagne  09/06/2015

## 2015-09-07 DIAGNOSIS — H25813 Combined forms of age-related cataract, bilateral: Secondary | ICD-10-CM | POA: Diagnosis not present

## 2015-09-07 DIAGNOSIS — I130 Hypertensive heart and chronic kidney disease with heart failure and stage 1 through stage 4 chronic kidney disease, or unspecified chronic kidney disease: Secondary | ICD-10-CM | POA: Diagnosis not present

## 2015-09-07 DIAGNOSIS — H524 Presbyopia: Secondary | ICD-10-CM | POA: Diagnosis not present

## 2015-09-07 DIAGNOSIS — J441 Chronic obstructive pulmonary disease with (acute) exacerbation: Secondary | ICD-10-CM | POA: Diagnosis not present

## 2015-09-07 DIAGNOSIS — G4733 Obstructive sleep apnea (adult) (pediatric): Secondary | ICD-10-CM | POA: Diagnosis not present

## 2015-09-07 DIAGNOSIS — I480 Paroxysmal atrial fibrillation: Secondary | ICD-10-CM | POA: Diagnosis not present

## 2015-09-07 DIAGNOSIS — N183 Chronic kidney disease, stage 3 (moderate): Secondary | ICD-10-CM | POA: Diagnosis not present

## 2015-09-07 DIAGNOSIS — I5043 Acute on chronic combined systolic (congestive) and diastolic (congestive) heart failure: Secondary | ICD-10-CM | POA: Diagnosis not present

## 2015-09-08 DIAGNOSIS — N2889 Other specified disorders of kidney and ureter: Secondary | ICD-10-CM | POA: Diagnosis not present

## 2015-09-08 DIAGNOSIS — G4733 Obstructive sleep apnea (adult) (pediatric): Secondary | ICD-10-CM | POA: Diagnosis not present

## 2015-09-08 DIAGNOSIS — M10079 Idiopathic gout, unspecified ankle and foot: Secondary | ICD-10-CM | POA: Diagnosis not present

## 2015-09-08 DIAGNOSIS — N2581 Secondary hyperparathyroidism of renal origin: Secondary | ICD-10-CM | POA: Diagnosis not present

## 2015-09-08 DIAGNOSIS — D631 Anemia in chronic kidney disease: Secondary | ICD-10-CM | POA: Diagnosis not present

## 2015-09-08 DIAGNOSIS — Z950 Presence of cardiac pacemaker: Secondary | ICD-10-CM | POA: Diagnosis not present

## 2015-09-08 DIAGNOSIS — I480 Paroxysmal atrial fibrillation: Secondary | ICD-10-CM | POA: Diagnosis not present

## 2015-09-08 DIAGNOSIS — N183 Chronic kidney disease, stage 3 (moderate): Secondary | ICD-10-CM | POA: Diagnosis not present

## 2015-09-08 DIAGNOSIS — Z86718 Personal history of other venous thrombosis and embolism: Secondary | ICD-10-CM | POA: Diagnosis not present

## 2015-09-08 DIAGNOSIS — I770 Arteriovenous fistula, acquired: Secondary | ICD-10-CM | POA: Diagnosis not present

## 2015-09-08 DIAGNOSIS — J449 Chronic obstructive pulmonary disease, unspecified: Secondary | ICD-10-CM | POA: Diagnosis not present

## 2015-09-12 ENCOUNTER — Telehealth (HOSPITAL_COMMUNITY): Payer: Self-pay | Admitting: Vascular Surgery

## 2015-09-12 DIAGNOSIS — I5022 Chronic systolic (congestive) heart failure: Secondary | ICD-10-CM

## 2015-09-12 MED ORDER — TORSEMIDE 20 MG PO TABS: 40.0000 mg | ORAL_TABLET | Freq: Every day | ORAL | Status: DC

## 2015-09-12 NOTE — Telephone Encounter (Signed)
Refill torsemide until Mail order get in

## 2015-09-18 ENCOUNTER — Encounter: Payer: Self-pay | Admitting: Cardiology

## 2015-10-12 ENCOUNTER — Ambulatory Visit (INDEPENDENT_AMBULATORY_CARE_PROVIDER_SITE_OTHER): Payer: Medicare Other | Admitting: *Deleted

## 2015-10-12 DIAGNOSIS — Z5181 Encounter for therapeutic drug level monitoring: Secondary | ICD-10-CM | POA: Diagnosis not present

## 2015-10-12 DIAGNOSIS — I480 Paroxysmal atrial fibrillation: Secondary | ICD-10-CM | POA: Diagnosis not present

## 2015-10-12 LAB — POCT INR: INR: 2.4

## 2015-10-16 ENCOUNTER — Other Ambulatory Visit: Payer: Self-pay | Admitting: *Deleted

## 2015-10-16 DIAGNOSIS — H2513 Age-related nuclear cataract, bilateral: Secondary | ICD-10-CM | POA: Diagnosis not present

## 2015-10-16 NOTE — Progress Notes (Signed)
Patient ID: Maurice Delgado, male   DOB: Jul 15, 1933, 82 y.o.   MRN: [id number]    Advanced Heart Failure Clinic Note   PCP: Dr. Jonelle Sidle HF Cardiology: Dr Aundra Dubin  82 yo with history of chronic systolic CHF due to NICM, CHB s/p Medtronic CRT-D 2002 with generator change 2011, HTN, history of DVT on chronic anticoag with coumadin, COPD, PAF, CKD stage III and hx of OSA who was admitted from [place] [facility] in 1/17 for weight gain and dyspnea.  He was thought to be volume overloaded and was diuresed.    He returns today for follow up. Low dose coreg added at last visit. Weight is up 9 lbs, but he is wearing heavy clothes. Overall feels OK at home.  Is having trouble sleeping, doing a lot of sleeping in the evening and day time, and then is up late at night. Sleep Study was negative for OSA, so he has not been using CPAP. Doesn't feel like he gets good rest. Denies SOB with ADLs.  Uses a motorized cart at grocery stores. Denies orthopnea or CP. Had a little bit of SOB walking from parking garage to waiting room, but his 02 was turned off.  Usually dose OK with 02.    Denies lightheadedness or dizziness.   Optivol assessed:  Fluid index < threshold, impedance stable. Occasional AT/AF alerts, but just dots. Likely SVTs. PT activity < 1 hr a day.   ECG: 72 bpm, AV dual paced, QT/QTc 458/501 ms  Labs (1/17): K 3.6, creatinine 1.56 Labs 07/20/2015: K 4.5 Creatinine 2.33, digoxin 0.5  Labs (2/17): K 4.3, creatinine 2.24 Labs (3/17) K, 4.4, Creatinine 1.73, Dig 0.5  PMH: 1. Sciatica 2. H/o DVT: Recurrent.  Has IVC filter 3. Complete heart block: Medtronic CRT-D device placed 2002.  4. Atrial fibrillation: Paroxysmal. On sotalol to maintain NSR.  5. OSA: No longer uses CPAP.  6. Chronic systolic CHF: Echo (123456) with EF 20-25%, mildly dilated LV, normal RV size and systolic function, mild MR.  Nonischemic cardiomyopathy.  Has MDT CRT-D device.  7. COPD: On home oxygen.  8. History of retroperitoneal  hemorrhage.  9. CKD: Stage III.  10. Gout 11. GERD 12. Atrial tachycardia: s/p ablation 2014.   Social History   Social History  . Marital Status: Married    Spouse Name: N/A  . Number of Children: N/A  . Years of Education: N/A   Occupational History  . reitred     from education   Social History Main Topics  . Smoking status: Former Smoker -- 1.50 packs/day for 30 years    Types: Cigarettes  . Smokeless tobacco: Never Used     Comment: 06/21/2013 "quit smoking ~ 1986"  . Alcohol Use: No     Comment: 06/21/2013 "quit drinking ~ 1970"  . Drug Use: Yes    Special: Marijuana     Comment: 06/21/2013 "stopped using marijuana ~ 1970"  . Sexual Activity: Not Currently   Other Topics Concern  . Not on file   Social History Narrative   Married and has 1 son.    Lives in [place].  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   Family History  Problem Relation Age of Onset  . Heart disease Mother     mother also had Rheumatism.  Marland Kitchen Heart attack Neg Hx   . Stroke Neg Hx   . Hypertension Mother    ROS: All systems reviewed and negative except as per HPI.   Current Outpatient Prescriptions  Medication  Sig Dispense Refill  . Ascorbic Acid (VITAMIN C) 1000 MG tablet Take 1,000 mg by mouth every morning.     . budesonide-formoterol (SYMBICORT) 160-4.5 MCG/ACT inhaler Inhale 2 puffs into the lungs 2 (two) times daily. 3 Inhaler 1  . calcitRIOL (ROCALTROL) 0.5 MCG capsule Take 0.5 mcg by mouth every other day.     . carvedilol (COREG) 3.125 MG tablet Take 1 tablet (3.125 mg total) by mouth 2 (two) times daily. 180 tablet 3  . digoxin (DIGITEK) 0.125 MG tablet Take 1 tablet (125 mcg total) by mouth every morning. 90 tablet 3  . Febuxostat (ULORIC) 80 MG TABS Take 80 mg by mouth every morning.     . levalbuterol (XOPENEX HFA) 45 MCG/ACT inhaler Inhale 2 puffs into the lungs every 6 (six) hours as needed for wheezing.    . Olopatadine HCl (PATADAY) 0.2 % SOLN Place 1 drop into both eyes every  morning.    . potassium chloride SA (K-DUR,KLOR-CON) 20 MEQ tablet Take 1 tablet (20 mEq total) by mouth daily. 90 tablet 3  . pregabalin (LYRICA) 150 MG capsule Take 150 mg by mouth 2 (two) times daily.      . sotalol (BETAPACE) 160 MG tablet Take 1 tablet (160 mg total) by mouth daily. 90 tablet 3  . Tamsulosin HCl (FLOMAX) 0.4 MG CAPS Take 0.4 mg by mouth every morning.     . tiotropium (SPIRIVA) 18 MCG inhalation capsule Place 1 capsule (18 mcg total) into inhaler and inhale every morning. 90 capsule 3  . torsemide (DEMADEX) 20 MG tablet Take 2 tablets (40 mg total) by mouth daily. 180 tablet 3  . warfarin (COUMADIN) 5 MG tablet Take as directed by coumadin clinic (Patient taking differently: 5-7.5 mg. Tuesday take  5 mg and every day besides Tuesday take 7.5 mg) 150 tablet 0   No current facility-administered medications for this encounter.   BP 96/64 mmHg  Pulse 91  Wt 285 lb 3.2 oz (129.366 kg)  SpO2 90%   Wt Readings from Last 3 Encounters:  10/17/15 285 lb 3.2 oz (129.366 kg)  09/05/15 276 lb 12 oz (125.533 kg)  08/03/15 284 lb (128.822 kg)    General: NAD, obese.  Neck: JVP 6-7 cm, no thyromegaly or thyroid nodule.  Lungs: Slightly diminished bases CV: Nondisplaced PMI.  Heart regular S1/S2, no S3/S4, no murmur.  Trace - 1+ LE edema.  No carotid bruit.  Normal pedal pulses.  Abdomen: Obese, NT, ND, no HSM. No bruits or masses. +BS  Skin: Intact without lesions or rashes.  Neurologic: Alert and oriented x 3.  Psych: Normal affect. Extremities: No clubbing or cyanosis.  HEENT: Normal.   Assessment/Plan: 1. Atrial fibrillation: Paroxysmal.   - Continue sotalol per EP, paced QTc on ECG today is acceptable. BMET today.  - Continue warfarin per coumadin clinic.  2. COPD: On home oxygen 2L chronically.  3. Chronic systolic CHF: EF 0000000, nonischemic cardiomyopathy.  MDT CRT-D device.   - Volume status stable on exam and  Optivol despite weight gain. NYHA class III  symptoms.  - Continue torsemide 40 mg and KCl 20. Can take an extra 20 mg torsemide as needed for weight gain.  - BMET today.  - He states he is not taking Bidil, and pressure is soft today. Will not restart at this time.  - Continue digoxin. Level 0.5 on 09/05/15. - No ACEI/ARB for now with elevated creatinine. - Continue Coreg 3.125 mg bid. No room to up-titrate with  soft pressure. - Reinforced fluid restriction to < 2 L daily, sodium restriction to less than 2000 mg daily, and the importance of daily weights.  4. CKD: Stage III .  Check BMET today.   5. Hx of DVT - on warfarin as above.   Follow up 2 months. Labs today.  No room for med titration with soft BP.    Satira Mccallum Tillery PA-C 10/17/2015

## 2015-10-17 ENCOUNTER — Ambulatory Visit (HOSPITAL_COMMUNITY)
Admission: RE | Admit: 2015-10-17 | Discharge: 2015-10-17 | Disposition: A | Discharge status: Home / Self Care | Payer: Medicare Other | Source: Ambulatory Visit | Attending: Cardiology | Admitting: Cardiology

## 2015-10-17 ENCOUNTER — Encounter (HOSPITAL_COMMUNITY): Payer: Self-pay

## 2015-10-17 VITALS — BP 96/64 | HR 91 | Wt 285.2 lb

## 2015-10-17 DIAGNOSIS — I442 Atrioventricular block, complete: Secondary | ICD-10-CM | POA: Diagnosis not present

## 2015-10-17 DIAGNOSIS — Z95810 Presence of automatic (implantable) cardiac defibrillator: Secondary | ICD-10-CM | POA: Insufficient documentation

## 2015-10-17 DIAGNOSIS — I480 Paroxysmal atrial fibrillation: Secondary | ICD-10-CM

## 2015-10-17 DIAGNOSIS — Z7901 Long term (current) use of anticoagulants: Secondary | ICD-10-CM

## 2015-10-17 DIAGNOSIS — J438 Other emphysema: Secondary | ICD-10-CM

## 2015-10-17 DIAGNOSIS — J449 Chronic obstructive pulmonary disease, unspecified: Secondary | ICD-10-CM | POA: Diagnosis not present

## 2015-10-17 DIAGNOSIS — N183 Chronic kidney disease, stage 3 unspecified: Secondary | ICD-10-CM

## 2015-10-17 DIAGNOSIS — Z86718 Personal history of other venous thrombosis and embolism: Secondary | ICD-10-CM | POA: Diagnosis not present

## 2015-10-17 DIAGNOSIS — Z8249 Family history of ischemic heart disease and other diseases of the circulatory system: Secondary | ICD-10-CM | POA: Insufficient documentation

## 2015-10-17 DIAGNOSIS — Z79899 Other long term (current) drug therapy: Secondary | ICD-10-CM | POA: Diagnosis not present

## 2015-10-17 DIAGNOSIS — Z9981 Dependence on supplemental oxygen: Secondary | ICD-10-CM | POA: Diagnosis not present

## 2015-10-17 DIAGNOSIS — I519 Heart disease, unspecified: Secondary | ICD-10-CM

## 2015-10-17 DIAGNOSIS — K219 Gastro-esophageal reflux disease without esophagitis: Secondary | ICD-10-CM | POA: Diagnosis not present

## 2015-10-17 DIAGNOSIS — Z5181 Encounter for therapeutic drug level monitoring: Secondary | ICD-10-CM | POA: Diagnosis not present

## 2015-10-17 DIAGNOSIS — I428 Other cardiomyopathies: Secondary | ICD-10-CM

## 2015-10-17 DIAGNOSIS — Z87891 Personal history of nicotine dependence: Secondary | ICD-10-CM | POA: Diagnosis not present

## 2015-10-17 DIAGNOSIS — I5022 Chronic systolic (congestive) heart failure: Secondary | ICD-10-CM | POA: Diagnosis not present

## 2015-10-17 DIAGNOSIS — I5189 Other ill-defined heart diseases: Secondary | ICD-10-CM

## 2015-10-17 DIAGNOSIS — I429 Cardiomyopathy, unspecified: Secondary | ICD-10-CM | POA: Diagnosis not present

## 2015-10-17 LAB — BASIC METABOLIC PANEL
ANION GAP: 11 (ref 5–15)
BUN: 27 mg/dL — AB (ref 6–20)
CALCIUM: 9.3 mg/dL (ref 8.9–10.3)
CO2: 28 mmol/L (ref 22–32)
Chloride: 101 mmol/L (ref 101–111)
Creatinine, Ser: 1.77 mg/dL — ABNORMAL HIGH (ref 0.61–1.24)
GFR calc Af Amer: 40 mL/min — ABNORMAL LOW (ref >60)
GFR, EST NON AFRICAN AMERICAN: 35 mL/min — AB (ref >60)
Glucose, Bld: 109 mg/dL — ABNORMAL HIGH (ref 65–99)
POTASSIUM: 4.1 mmol/L (ref 3.5–5.1)
SODIUM: 140 mmol/L (ref 135–145)

## 2015-10-17 MED ORDER — DIGOXIN 125 MCG PO TABS: 125.0000 ug | ORAL_TABLET | Freq: Every morning | ORAL | Status: DC

## 2015-10-17 MED ORDER — SOTALOL HCL 160 MG PO TABS: 160.0000 mg | ORAL_TABLET | Freq: Every day | ORAL | Status: DC

## 2015-10-17 MED ORDER — POTASSIUM CHLORIDE CRYS ER 20 MEQ PO TBCR: 20.0000 meq | EXTENDED_RELEASE_TABLET | Freq: Every day | ORAL | Status: DC

## 2015-10-17 MED ORDER — CARVEDILOL 3.125 MG PO TABS: 3.1250 mg | ORAL_TABLET | Freq: Two times a day (BID) | ORAL | Status: DC

## 2015-10-17 MED ORDER — TORSEMIDE 20 MG PO TABS: 40.0000 mg | ORAL_TABLET | Freq: Every day | ORAL | Status: DC

## 2015-10-17 NOTE — Patient Instructions (Signed)
Labs today  Your physician recommends that you schedule a follow-up appointment in: 2 months with Dr. Aundra Dubin  Do the following things EVERYDAY: 1) Weigh yourself in the morning before breakfast. Write it down and keep it in a log. 2) Take your medicines as prescribed 3) Eat low salt foods-Limit salt (sodium) to 2000 mg per day.  4) Stay as active as you can everyday 5) Limit all fluids for the day to less than 2 liters 6)

## 2015-10-17 NOTE — Addendum Note (Signed)
Encounter addended by: Kerry Dory, CMA on: 10/17/2015  4:44 PM<BR>     Documentation filed: Visit Diagnoses, Dx Association, Orders

## 2015-10-18 NOTE — Telephone Encounter (Signed)
Addressed by other means  

## 2015-10-24 ENCOUNTER — Encounter: Payer: Self-pay | Admitting: Internal Medicine

## 2015-11-09 ENCOUNTER — Telehealth: Payer: Self-pay | Admitting: Pulmonary Disease

## 2015-11-09 NOTE — Telephone Encounter (Signed)
LMTCB

## 2015-11-10 NOTE — Telephone Encounter (Signed)
ATC x 3, unable to get through - loud ringing sound and line goes dead wcb

## 2015-11-14 NOTE — Telephone Encounter (Signed)
Atc, line rang several times, no answer, no vm.  Wcb.

## 2015-11-15 NOTE — Telephone Encounter (Signed)
atc X4, line rang several times, line was picked up and phone on other end hung up.  Will send letter to pt as we have attempted X4 to call pt back.  Will close message per triage protocol.

## 2015-11-17 ENCOUNTER — Telehealth (HOSPITAL_COMMUNITY): Payer: Self-pay | Admitting: *Deleted

## 2015-11-17 NOTE — Telephone Encounter (Signed)
Received note from [facility], pt needs clearance to have cataract extraction, per Dr Aundra Dubin pt is medically cleared, note faxed to them at [phone]

## 2015-11-24 ENCOUNTER — Telehealth: Payer: Self-pay | Admitting: Pulmonary Disease

## 2015-11-24 MED ORDER — BUDESONIDE-FORMOTEROL FUMARATE 160-4.5 MCG/ACT IN AERO: 2.0000 | INHALATION_SPRAY | Freq: Two times a day (BID) | RESPIRATORY_TRACT | Status: DC

## 2015-11-24 MED ORDER — TIOTROPIUM BROMIDE MONOHYDRATE 18 MCG IN CAPS: 18.0000 ug | ORAL_CAPSULE | Freq: Every morning | RESPIRATORY_TRACT | Status: DC

## 2015-11-24 NOTE — Telephone Encounter (Signed)
Spoke with pt and he states that he needs refills on Symbicort and Spiriva sent to [facility] for [facility]D supply. Advised pt that he is overdue for follow up. Rx's sent and appt made for f/u. Nothing further needed.

## 2015-11-28 DIAGNOSIS — H2512 Age-related nuclear cataract, left eye: Secondary | ICD-10-CM | POA: Diagnosis not present

## 2015-11-28 DIAGNOSIS — Z961 Presence of intraocular lens: Secondary | ICD-10-CM | POA: Diagnosis not present

## 2015-11-29 DIAGNOSIS — H25041 Posterior subcapsular polar age-related cataract, right eye: Secondary | ICD-10-CM | POA: Diagnosis not present

## 2015-11-29 DIAGNOSIS — H2511 Age-related nuclear cataract, right eye: Secondary | ICD-10-CM | POA: Diagnosis not present

## 2015-11-29 DIAGNOSIS — H25011 Cortical age-related cataract, right eye: Secondary | ICD-10-CM | POA: Diagnosis not present

## 2015-12-05 DIAGNOSIS — H2511 Age-related nuclear cataract, right eye: Secondary | ICD-10-CM | POA: Diagnosis not present

## 2015-12-05 DIAGNOSIS — Z961 Presence of intraocular lens: Secondary | ICD-10-CM | POA: Diagnosis not present

## 2015-12-18 ENCOUNTER — Encounter (HOSPITAL_COMMUNITY): Payer: Medicare Other

## 2015-12-19 ENCOUNTER — Telehealth: Payer: Self-pay

## 2015-12-19 ENCOUNTER — Ambulatory Visit (HOSPITAL_COMMUNITY)
Admission: RE | Admit: 2015-12-19 | Discharge: 2015-12-19 | Disposition: A | Discharge status: Home / Self Care | Payer: Medicare Other | Source: Ambulatory Visit | Attending: Internal Medicine | Admitting: Internal Medicine

## 2015-12-19 VITALS — BP 118/78 | HR 91 | Wt 278.0 lb

## 2015-12-19 DIAGNOSIS — Z888 Allergy status to other drugs, medicaments and biological substances status: Secondary | ICD-10-CM | POA: Insufficient documentation

## 2015-12-19 DIAGNOSIS — Z95810 Presence of automatic (implantable) cardiac defibrillator: Secondary | ICD-10-CM | POA: Diagnosis not present

## 2015-12-19 DIAGNOSIS — I130 Hypertensive heart and chronic kidney disease with heart failure and stage 1 through stage 4 chronic kidney disease, or unspecified chronic kidney disease: Secondary | ICD-10-CM | POA: Diagnosis not present

## 2015-12-19 DIAGNOSIS — Z7901 Long term (current) use of anticoagulants: Secondary | ICD-10-CM | POA: Insufficient documentation

## 2015-12-19 DIAGNOSIS — N183 Chronic kidney disease, stage 3 unspecified: Secondary | ICD-10-CM

## 2015-12-19 DIAGNOSIS — I480 Paroxysmal atrial fibrillation: Secondary | ICD-10-CM

## 2015-12-19 DIAGNOSIS — I4891 Unspecified atrial fibrillation: Secondary | ICD-10-CM | POA: Insufficient documentation

## 2015-12-19 DIAGNOSIS — I5022 Chronic systolic (congestive) heart failure: Secondary | ICD-10-CM | POA: Diagnosis not present

## 2015-12-19 DIAGNOSIS — I519 Heart disease, unspecified: Secondary | ICD-10-CM

## 2015-12-19 DIAGNOSIS — Z86718 Personal history of other venous thrombosis and embolism: Secondary | ICD-10-CM | POA: Diagnosis not present

## 2015-12-19 DIAGNOSIS — J449 Chronic obstructive pulmonary disease, unspecified: Secondary | ICD-10-CM | POA: Insufficient documentation

## 2015-12-19 DIAGNOSIS — I442 Atrioventricular block, complete: Secondary | ICD-10-CM | POA: Diagnosis not present

## 2015-12-19 DIAGNOSIS — Z79899 Other long term (current) drug therapy: Secondary | ICD-10-CM | POA: Insufficient documentation

## 2015-12-19 DIAGNOSIS — I5189 Other ill-defined heart diseases: Secondary | ICD-10-CM

## 2015-12-19 DIAGNOSIS — Z9981 Dependence on supplemental oxygen: Secondary | ICD-10-CM | POA: Insufficient documentation

## 2015-12-19 DIAGNOSIS — Z8249 Family history of ischemic heart disease and other diseases of the circulatory system: Secondary | ICD-10-CM | POA: Diagnosis not present

## 2015-12-19 DIAGNOSIS — M109 Gout, unspecified: Secondary | ICD-10-CM | POA: Insufficient documentation

## 2015-12-19 DIAGNOSIS — I428 Other cardiomyopathies: Secondary | ICD-10-CM | POA: Diagnosis not present

## 2015-12-19 DIAGNOSIS — Z7951 Long term (current) use of inhaled steroids: Secondary | ICD-10-CM | POA: Diagnosis not present

## 2015-12-19 DIAGNOSIS — Z87891 Personal history of nicotine dependence: Secondary | ICD-10-CM | POA: Insufficient documentation

## 2015-12-19 LAB — PROTIME-INR
INR: 2.01 — AB (ref 0.00–1.49)
PROTHROMBIN TIME: 22.7 s — AB (ref 11.6–15.2)

## 2015-12-19 NOTE — Addendum Note (Signed)
Encounter addended by: Conrad Turon, [place] on: 12/19/2015  9:17 PM<BR>     Documentation filed: Notes Section

## 2015-12-19 NOTE — Telephone Encounter (Signed)
Pt called back scheduled f/u appt in clinic for 12/26/15.

## 2015-12-19 NOTE — Patient Instructions (Signed)
Labs today  You have been referred to [facility]- they will monitor for 4 weeks so we can arrange a cardioversion  Your physician has recommended that you have a Cardioversion (DCCV). Electrical Cardioversion uses a jolt of electricity to your heart either through paddles or wired patches attached to your chest. This is a controlled, usually prescheduled, procedure. Defibrillation is done under light anesthesia in the hospital, and you usually go home the day of the procedure. This is done to get your heart back into a normal rhythm. You are not awake for the procedure. Please see the instruction sheet given to you today.  Your physician recommends that you schedule a follow-up appointment in: 4 weeks with Dr Aundra Dubin

## 2015-12-19 NOTE — Progress Notes (Signed)
Advanced Heart Failure Medication Review by a Pharmacist  Does the patient  feel that his/her medications are working for him/her?  No, he feels as though his diuretics are not working for him   Has the patient been experiencing any side effects to the medications prescribed?  no  Does the patient measure his/her own blood pressure or blood glucose at home?  yes   Does the patient have any problems obtaining medications due to transportation or finances?   no  Understanding of regimen: fair Understanding of indications: fair Potential of compliance: fair Patient understands to avoid NSAIDs. Patient understands to avoid decongestants.  Pharmacist comments: Mr. Maurice Delgado is a pleasant 86 yom presenting to clinic for a follow-up appointment. HE states that he is not currently experiencing any medication-related side effects and that he rarely misses any doses of his medications. The patients is complaining of "extra fluid in his legs and abdomen" and some dizziness when standing too quickly. He denies any excessive SOB other than what he experiences at baseline (home O2). The patient and his wife feel as though his diuretics are not working.   He did not have any other medication-related questions or concerns at this time.   Issak Goley C. Lennox Grumbles, PharmD Pharmacy Resident  Pager: [phone] 12/19/2015 9:16 PM     Time with patient: 10 min  Preparation and documentation time: 2 min Total time: 12 min

## 2015-12-19 NOTE — Telephone Encounter (Signed)
Camila from HF clinic called LM on VM pt needs appt in Coumadin clinic for weekly checks x 4, pending DCCV.  Attempted to call Camila back to schedule pt's appt as he is already past due for follow-up.  Pt has not had his INR checked since April.  Pt has already left their office.  Advised to notify Dr Aundra Dubin pt has history of non-compliance in follow-up and hasn't had INR checked since April.  We will attempt to contact pt and schedule for follow-up as requested.  Called both pt's home and cell phone #s, left message TCB for appt.

## 2015-12-19 NOTE — Progress Notes (Signed)
Patient ID: Maurice Delgado, male   DOB: 1933/10/05, 82 y.o.   MRN: [id number]    Advanced Heart Failure Clinic Note   PCP: Dr. Jonelle Sidle HF Cardiology: Dr Aundra Dubin  82 yo with history of chronic systolic CHF due to NICM, CHB s/p Medtronic CRT-D 2002 with generator change 2011, HTN, history of DVT on chronic anticoag with coumadin, COPD, PAF, CKD stage III and hx of OSA.    He returns today for follow up. Complaining of fatigue. Mild dyspnea with exertion. Wears 2 liters oxygen daily.His wife says he is not moving around much. Fasting during the day but eating meals at night.  Does not use CPAP. No bleeding problems. Taking all medications. He has not missed his dose of coumadin in the last month.   Optivol assessed:  In A fib since June 1st. Activity less than 1 hour per day.  Labs (1/17): K 3.6, creatinine 1.56 Labs 07/20/2015: K 4.5 Creatinine 2.33, digoxin 0.5  Labs (2/17): K 4.3, creatinine 2.24 Labs (3/17) K, 4.4, Creatinine 1.73, Dig 0.5 Lab 10/12/2015: INR 2.4  Labs (10/17/2015): K 4.1 Creatinine 1.77  PMH: 1. Sciatica 2. H/o DVT: Recurrent.  Has IVC filter 3. Complete heart block: Medtronic CRT-D device placed 2002.  4. Atrial fibrillation: Paroxysmal. On sotalol to maintain NSR.  5. OSA: No longer uses CPAP.  6. Chronic systolic CHF: Echo (123456) with EF 20-25%, mildly dilated LV, normal RV size and systolic function, mild MR.  Nonischemic cardiomyopathy.  Has MDT CRT-D device.  7. COPD: On home oxygen.  8. History of retroperitoneal hemorrhage.  9. CKD: Stage III.  10. Gout 11. GERD 12. Atrial tachycardia: s/p ablation 2014.   Social History   Social History  . Marital Status: Married    Spouse Name: N/A  . Number of Children: N/A  . Years of Education: N/A   Occupational History  . reitred     from education   Social History Main Topics  . Smoking status: Former Smoker -- 1.50 packs/day for 30 years    Types: Cigarettes  . Smokeless tobacco: Never Used     Comment:  06/21/2013 "quit smoking ~ 1986"  . Alcohol Use: No     Comment: 06/21/2013 "quit drinking ~ 1970"  . Drug Use: Yes    Special: Marijuana     Comment: 06/21/2013 "stopped using marijuana ~ 1970"  . Sexual Activity: Not Currently   Other Topics Concern  . Not on file   Social History Narrative   Married and has 1 son.    Lives in [place].  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   Family History  Problem Relation Age of Onset  . Heart disease Mother     mother also had Rheumatism.  Marland Kitchen Heart attack Neg Hx   . Stroke Neg Hx   . Hypertension Mother    ROS: All systems reviewed and negative except as per HPI.   Current Outpatient Prescriptions  Medication Sig Dispense Refill  . Ascorbic Acid (VITAMIN C) 1000 MG tablet Take 1,000 mg by mouth every morning.     . budesonide-formoterol (SYMBICORT) 160-4.5 MCG/ACT inhaler Inhale 2 puffs into the lungs 2 (two) times daily. 3 Inhaler 1  . calcitRIOL (ROCALTROL) 0.5 MCG capsule Take 0.5 mcg by mouth every other day.     . carvedilol (COREG) 3.125 MG tablet Take 1 tablet (3.125 mg total) by mouth 2 (two) times daily. 180 tablet 3  . digoxin (DIGITEK) 0.125 MG tablet Take 1 tablet (125 mcg  total) by mouth every morning. 90 tablet 3  . Febuxostat (ULORIC) 80 MG TABS Take 80 mg by mouth every morning.     . levalbuterol (XOPENEX HFA) 45 MCG/ACT inhaler Inhale 2 puffs into the lungs every 6 (six) hours as needed for wheezing.    . Olopatadine HCl (PATADAY) 0.2 % SOLN Place 1 drop into both eyes every morning.    . potassium chloride SA (K-DUR,KLOR-CON) 20 MEQ tablet Take 1 tablet (20 mEq total) by mouth daily. 90 tablet 3  . pregabalin (LYRICA) 150 MG capsule Take 150 mg by mouth 2 (two) times daily.      . sotalol (BETAPACE) 160 MG tablet Take 1 tablet (160 mg total) by mouth daily. 90 tablet 3  . Tamsulosin HCl (FLOMAX) 0.4 MG CAPS Take 0.4 mg by mouth every morning.     . tiotropium (SPIRIVA) 18 MCG inhalation capsule Place 1 capsule (18 mcg total) into  inhaler and inhale every morning. 90 capsule 1  . torsemide (DEMADEX) 20 MG tablet Take 2 tablets (40 mg total) by mouth daily. 180 tablet 3  . warfarin (COUMADIN) 5 MG tablet Take as directed by coumadin clinic (Patient taking differently: 5-7.5 mg. Tuesday take  5 mg and every day besides Tuesday take 7.5 mg) 150 tablet 0   No current facility-administered medications for this encounter.   BP 118/78 mmHg  Pulse 91  Wt 278 lb (126.1 kg)  SpO2 91%   Wt Readings from Last 3 Encounters:  12/19/15 278 lb (126.1 kg)  10/17/15 285 lb 3.2 oz (129.366 kg)  09/05/15 276 lb 12 oz (125.533 kg)    General: NAD, obese. Wife present  Neck: JVP 6-7 cm, no thyromegaly or thyroid nodule.  Lungs: Clear. On 2 liters [place]. CV: Nondisplaced PMI.  Heart regular S1/S2, no S3/S4, no murmur.  Trace - 1+ LE edema.  No carotid bruit.  Normal pedal pulses.  Abdomen: Obese, NT, ND, no HSM. No bruits or masses. +BS  Skin: Intact without lesions or rashes.  Neurologic: Alert and oriented x 3.  Psych: Normal affect. Extremities: No clubbing or cyanosis. R and LLE trace-1+ HEENT: Normal.   Assessment/Plan: 1. Atrial fibrillation: In A fib today . Last INR was in April. Missed May INR check. Plan to check INR today and check INR  Weekly for 1 month. If remains in A Fib will need DC-CV.  - Continue sotalol per EP. Allergic to amiodarone  - Continue warfarin per coumadin clinic.  2. COPD: On home oxygen 2L chronically.  3. Chronic systolic CHF: EF 0000000, nonischemic cardiomyopathy.  MDT CRT-D device.   - Volume status stable. NYHA class III symptoms.  Continue torsemide 40 mg and KCl 20. Can take an extra 20 mg torsemide as needed for weight gain.  - In the past he was on bidil but has stopped and does not want to restart. Could consider low dose lisinopril but hold off for now.  - Continue digoxin. Level 0.5 on 09/05/15. - No ACEI/ARB for now with elevated creatinine. - Continue Coreg 3.125 mg bid. No room to  up-titrate with soft pressure. - Reinforced fluid restriction to < 2 L daily, sodium restriction to less than 2000 mg daily, and the importance of daily weights.  4. CKD: Stage III- Creatinine baseline ~1.7  5. Hx of DVT - on warfarin as above. Missed INR check in May.   Check INR today then weekly for 4 weeks. Set up DC-CV after INR therapeutic INR for 1  month.  Follow up with Dr Aundra Dubin in 4-5 weeks.   Amy Clegg NP-C  12/19/2015

## 2015-12-26 ENCOUNTER — Encounter: Payer: Self-pay | Admitting: Cardiology

## 2015-12-29 ENCOUNTER — Telehealth (HOSPITAL_COMMUNITY): Payer: Self-pay | Admitting: Cardiology

## 2015-12-29 ENCOUNTER — Encounter (HOSPITAL_COMMUNITY): Payer: Self-pay | Admitting: Cardiology

## 2015-12-29 ENCOUNTER — Ambulatory Visit (INDEPENDENT_AMBULATORY_CARE_PROVIDER_SITE_OTHER): Payer: Medicare Other

## 2015-12-29 DIAGNOSIS — Z5181 Encounter for therapeutic drug level monitoring: Secondary | ICD-10-CM

## 2015-12-29 DIAGNOSIS — I480 Paroxysmal atrial fibrillation: Secondary | ICD-10-CM

## 2015-12-29 LAB — POCT INR: INR: 2.9

## 2015-12-29 NOTE — Telephone Encounter (Signed)
Opened in error

## 2016-01-01 ENCOUNTER — Other Ambulatory Visit (HOSPITAL_COMMUNITY): Payer: Self-pay | Admitting: *Deleted

## 2016-01-01 DIAGNOSIS — I4891 Unspecified atrial fibrillation: Secondary | ICD-10-CM

## 2016-01-04 ENCOUNTER — Ambulatory Visit (INDEPENDENT_AMBULATORY_CARE_PROVIDER_SITE_OTHER): Payer: Medicare Other | Admitting: *Deleted

## 2016-01-04 DIAGNOSIS — I480 Paroxysmal atrial fibrillation: Secondary | ICD-10-CM | POA: Diagnosis not present

## 2016-01-04 DIAGNOSIS — Z5181 Encounter for therapeutic drug level monitoring: Secondary | ICD-10-CM | POA: Diagnosis not present

## 2016-01-04 LAB — POCT INR: INR: 2.6

## 2016-01-09 ENCOUNTER — Ambulatory Visit (HOSPITAL_COMMUNITY)
Admission: RE | Admit: 2016-01-09 | Discharge: 2016-01-09 | Disposition: A | Discharge status: Home / Self Care | Payer: Medicare Other | Source: Ambulatory Visit | Attending: Cardiology | Admitting: Cardiology

## 2016-01-09 ENCOUNTER — Ambulatory Visit (HOSPITAL_BASED_OUTPATIENT_CLINIC_OR_DEPARTMENT_OTHER)
Admission: RE | Admit: 2016-01-09 | Discharge: 2016-01-09 | Disposition: A | Discharge status: Home / Self Care | Payer: Medicare Other | Source: Ambulatory Visit | Attending: Cardiology | Admitting: Cardiology

## 2016-01-09 ENCOUNTER — Ambulatory Visit (HOSPITAL_COMMUNITY): Payer: Medicare Other

## 2016-01-09 ENCOUNTER — Encounter (HOSPITAL_COMMUNITY)
Admission: RE | Disposition: A | Discharge status: Home / Self Care | Payer: Self-pay | Source: Ambulatory Visit | Attending: Cardiology

## 2016-01-09 ENCOUNTER — Encounter (HOSPITAL_COMMUNITY): Payer: Self-pay | Admitting: Cardiology

## 2016-01-09 ENCOUNTER — Ambulatory Visit (HOSPITAL_COMMUNITY): Payer: Medicare Other | Admitting: Anesthesiology

## 2016-01-09 ENCOUNTER — Ambulatory Visit (INDEPENDENT_AMBULATORY_CARE_PROVIDER_SITE_OTHER): Payer: Medicare Other | Admitting: *Deleted

## 2016-01-09 DIAGNOSIS — I129 Hypertensive chronic kidney disease with stage 1 through stage 4 chronic kidney disease, or unspecified chronic kidney disease: Secondary | ICD-10-CM | POA: Diagnosis not present

## 2016-01-09 DIAGNOSIS — Z87891 Personal history of nicotine dependence: Secondary | ICD-10-CM | POA: Insufficient documentation

## 2016-01-09 DIAGNOSIS — I428 Other cardiomyopathies: Secondary | ICD-10-CM | POA: Diagnosis not present

## 2016-01-09 DIAGNOSIS — Z79899 Other long term (current) drug therapy: Secondary | ICD-10-CM | POA: Diagnosis not present

## 2016-01-09 DIAGNOSIS — G4733 Obstructive sleep apnea (adult) (pediatric): Secondary | ICD-10-CM | POA: Diagnosis not present

## 2016-01-09 DIAGNOSIS — I5022 Chronic systolic (congestive) heart failure: Secondary | ICD-10-CM | POA: Insufficient documentation

## 2016-01-09 DIAGNOSIS — Z45018 Encounter for adjustment and management of other part of cardiac pacemaker: Secondary | ICD-10-CM | POA: Diagnosis not present

## 2016-01-09 DIAGNOSIS — M109 Gout, unspecified: Secondary | ICD-10-CM | POA: Insufficient documentation

## 2016-01-09 DIAGNOSIS — Z950 Presence of cardiac pacemaker: Secondary | ICD-10-CM | POA: Diagnosis not present

## 2016-01-09 DIAGNOSIS — I480 Paroxysmal atrial fibrillation: Secondary | ICD-10-CM | POA: Insufficient documentation

## 2016-01-09 DIAGNOSIS — I130 Hypertensive heart and chronic kidney disease with heart failure and stage 1 through stage 4 chronic kidney disease, or unspecified chronic kidney disease: Secondary | ICD-10-CM | POA: Diagnosis not present

## 2016-01-09 DIAGNOSIS — I4891 Unspecified atrial fibrillation: Secondary | ICD-10-CM | POA: Diagnosis not present

## 2016-01-09 DIAGNOSIS — J449 Chronic obstructive pulmonary disease, unspecified: Secondary | ICD-10-CM | POA: Diagnosis not present

## 2016-01-09 DIAGNOSIS — Z86718 Personal history of other venous thrombosis and embolism: Secondary | ICD-10-CM | POA: Diagnosis not present

## 2016-01-09 DIAGNOSIS — Z5181 Encounter for therapeutic drug level monitoring: Secondary | ICD-10-CM | POA: Diagnosis not present

## 2016-01-09 DIAGNOSIS — N183 Chronic kidney disease, stage 3 (moderate): Secondary | ICD-10-CM | POA: Diagnosis not present

## 2016-01-09 DIAGNOSIS — I442 Atrioventricular block, complete: Secondary | ICD-10-CM | POA: Insufficient documentation

## 2016-01-09 DIAGNOSIS — Z7901 Long term (current) use of anticoagulants: Secondary | ICD-10-CM | POA: Insufficient documentation

## 2016-01-09 DIAGNOSIS — Z9981 Dependence on supplemental oxygen: Secondary | ICD-10-CM | POA: Diagnosis not present

## 2016-01-09 HISTORY — PX: CARDIOVERSION: SHX1299

## 2016-01-09 HISTORY — PX: TEE WITHOUT CARDIOVERSION: SHX5443

## 2016-01-09 LAB — POCT INR: INR: 2.6

## 2016-01-09 LAB — POCT I-STAT 4, (NA,K, GLUC, HGB,HCT)
GLUCOSE: 93 mg/dL (ref 65–99)
HCT: 37 % — ABNORMAL LOW (ref 39.0–52.0)
HEMOGLOBIN: 12.6 g/dL — AB (ref 13.0–17.0)
Potassium: 3.9 mmol/L (ref 3.5–5.1)
Sodium: 143 mmol/L (ref 135–145)

## 2016-01-09 SURGERY — ECHOCARDIOGRAM, TRANSESOPHAGEAL
Anesthesia: Monitor Anesthesia Care

## 2016-01-09 MED ORDER — LIDOCAINE HCL (CARDIAC) 20 MG/ML IV SOLN
INTRAVENOUS | Status: DC | PRN
  Administered 2016-01-09: 50 mg via INTRAVENOUS

## 2016-01-09 MED ORDER — GLYCOPYRROLATE 0.2 MG/ML IJ SOLN
INTRAMUSCULAR | Status: DC | PRN
  Administered 2016-01-09: 0.1 mg via INTRAVENOUS

## 2016-01-09 MED ORDER — PROPOFOL 500 MG/50ML IV EMUL
INTRAVENOUS | Status: DC | PRN
  Administered 2016-01-09: 25 ug/kg/min via INTRAVENOUS

## 2016-01-09 MED ORDER — SODIUM CHLORIDE 0.9 % IV SOLN
INTRAVENOUS | Status: DC
  Administered 2016-01-09: 13:00:00 via INTRAVENOUS

## 2016-01-09 MED ORDER — BUTAMBEN-TETRACAINE-BENZOCAINE 2-2-14 % EX AERO
INHALATION_SPRAY | CUTANEOUS | Status: DC | PRN
  Administered 2016-01-09: 2 via TOPICAL

## 2016-01-09 MED ORDER — PROPOFOL 10 MG/ML IV BOLUS
INTRAVENOUS | Status: DC | PRN
  Administered 2016-01-09 (×2): 10 mg via INTRAVENOUS

## 2016-01-09 NOTE — CV Procedure (Signed)
Procedure: TEE  Indication: Atrial fibrillation  Sedation: Per anesthesiology  Findings: Please see echo section for full report. Normal LV size with diffuse hypokinesis, EF around 30%.  Mildly dilated RV with mildly decreased systolic function.  Trivial TR, peak RV-RA gradient 19 mmHg.  Trivial mitral regurgitation.  Trileaflet aortic valve with no stenosis or regurgitation.  Pacemaker wires noted in RA and RV.  Mildly dilated right atrium, mildly dilated left atrium.  No LA appendage thrombus.  No ASD or PFO by color doppler examination.  Normal caliber aorta with mild plaque.    May proceed to DCCV.   Loralie Champagne 01/09/2016 1:40 PM

## 2016-01-09 NOTE — Procedures (Addendum)
Electrical Cardioversion Procedure Note Maurice Delgado [id number] Jan 16, 1934  Procedure: Electrical Cardioversion Indications:  Atrial Fibrillation  Procedure Details Consent: Risks of procedure as well as the alternatives and risks of each were explained to the (patient/caregiver).  Consent for procedure obtained. Time Out: Verified patient identification, verified procedure, site/side was marked, verified correct patient position, special equipment/implants available, medications/allergies/relevent history reviewed, required imaging and test results available.  Performed  Patient placed on cardiac monitor, pulse oximetry, supplemental oxygen as necessary.  Sedation given: Per anesthesiology Pacer pads placed anterior and posterior chest.  Cardioverted 1 time(s).  Cardioverted at [place].  Evaluation Findings: Post procedure EKG shows: NSR Complications: None Patient did tolerate procedure well.  Medtronic device was checked post-procedure and was functioning normally.    Loralie Champagne 01/09/2016, 1:40 PM

## 2016-01-09 NOTE — Interval H&P Note (Signed)
History and Physical Interval Note:  01/09/2016 1:25 PM  Maurice Delgado  has presented today for surgery, with the diagnosis of AFIB  The various methods of treatment have been discussed with the patient and family. After consideration of risks, benefits and other options for treatment, the patient has consented to  Procedure(s): TRANSESOPHAGEAL ECHOCARDIOGRAM (TEE) (N/A) CARDIOVERSION (N/A) as a surgical intervention .  The patient's history has been reviewed, patient examined, no change in status, stable for surgery.  I have reviewed the patient's chart and labs.  Questions were answered to the patient's satisfaction.     Tatjana Turcott [employer]

## 2016-01-09 NOTE — Discharge Instructions (Signed)
Electrical Cardioversion, Care After °Refer to this sheet in the next few weeks. These instructions provide you with information on caring for yourself after your procedure. Your health care provider may also give you more specific instructions. Your treatment has been planned according to current medical practices, but problems sometimes occur. Call your health care provider if you have any problems or questions after your procedure. °WHAT TO EXPECT AFTER THE PROCEDURE °After your procedure, it is typical to have the following sensations: °· Some redness on the skin where the shocks were delivered. If this is tender, a sunburn lotion or hydrocortisone cream may help. °· Possible return of an abnormal heart rhythm within hours or days after the procedure. °HOME CARE INSTRUCTIONS °· Take medicines only as directed by your health care provider. Be sure you understand how and when to take your medicine. °· Learn how to feel your pulse and check it often. °· Limit your activity for 48 hours after the procedure or as directed by your health care provider. °· Avoid or minimize caffeine and other stimulants as directed by your health care provider. °SEEK MEDICAL CARE IF: °· You feel like your heart is beating too fast or your pulse is not regular. °· You have any questions about your medicines. °· You have bleeding that will not stop. °SEEK IMMEDIATE MEDICAL CARE IF: °· You are dizzy or feel faint. °· It is hard to breathe or you feel short of breath. °· There is a change in discomfort in your chest. °· Your speech is slurred or you have trouble moving an arm or leg on one side of your body. °· You get a serious muscle cramp that does not go away. °· Your fingers or toes turn cold or blue. °  °This information is not intended to replace advice given to you by your health care provider. Make sure you discuss any questions you have with your health care provider. °  °Document Released: 04/07/2013 Document Revised: 07/08/2014  Document Reviewed: 04/07/2013 °Elsevier Interactive Patient Education ©2016 [employer] Inc[employer] ° °

## 2016-01-09 NOTE — Anesthesia Postprocedure Evaluation (Signed)
Anesthesia Post Note  Patient: Maurice Delgado  Procedure(s) Performed: Procedure(s) (LRB): TRANSESOPHAGEAL ECHOCARDIOGRAM (TEE) (N/A) CARDIOVERSION (N/A)  Patient location during evaluation: PACU Anesthesia Type: MAC Level of consciousness: awake and alert Pain management: pain level controlled Vital Signs Assessment: post-procedure vital signs reviewed and stable Respiratory status: spontaneous breathing, nonlabored ventilation, respiratory function stable and patient connected to nasal cannula oxygen Cardiovascular status: stable and blood pressure returned to baseline Anesthetic complications: no    Last Vitals:  Filed Vitals:   01/09/16 1410 01/09/16 1420  BP: 132/78   Pulse: 70 71  Temp:    Resp: 15 13    Last Pain: There were no vitals filed for this visit.               Tiajuana Amass

## 2016-01-09 NOTE — H&P (View-Only) (Signed)
Patient ID: Maurice Delgado, male   DOB: 03-24-1934, 82 y.o.   MRN: [id number]    Advanced Heart Failure Clinic Note   PCP: Dr. Jonelle Sidle HF Cardiology: Dr Aundra Dubin  82 yo with history of chronic systolic CHF due to NICM, CHB s/p Medtronic CRT-D 2002 with generator change 2011, HTN, history of DVT on chronic anticoag with coumadin, COPD, PAF, CKD stage III and hx of OSA.    He returns today for follow up. Complaining of fatigue. Mild dyspnea with exertion. Wears 2 liters oxygen daily.His wife says he is not moving around much. Fasting during the day but eating meals at night.  Does not use CPAP. No bleeding problems. Taking all medications. He has not missed his dose of coumadin in the last month.   Optivol assessed:  In A fib since June 1st. Activity less than 1 hour per day.  Labs (1/17): K 3.6, creatinine 1.56 Labs 07/20/2015: K 4.5 Creatinine 2.33, digoxin 0.5  Labs (2/17): K 4.3, creatinine 2.24 Labs (3/17) K, 4.4, Creatinine 1.73, Dig 0.5 Lab 10/12/2015: INR 2.4  Labs (10/17/2015): K 4.1 Creatinine 1.77  PMH: 1. Sciatica 2. H/o DVT: Recurrent.  Has IVC filter 3. Complete heart block: Medtronic CRT-D device placed 2002.  4. Atrial fibrillation: Paroxysmal. On sotalol to maintain NSR.  5. OSA: No longer uses CPAP.  6. Chronic systolic CHF: Echo (123456) with EF 20-25%, mildly dilated LV, normal RV size and systolic function, mild MR.  Nonischemic cardiomyopathy.  Has MDT CRT-D device.  7. COPD: On home oxygen.  8. History of retroperitoneal hemorrhage.  9. CKD: Stage III.  10. Gout 11. GERD 12. Atrial tachycardia: s/p ablation 2014.   Social History   Social History  . Marital Status: Married    Spouse Name: N/A  . Number of Children: N/A  . Years of Education: N/A   Occupational History  . reitred     from education   Social History Main Topics  . Smoking status: Former Smoker -- 1.50 packs/day for 30 years    Types: Cigarettes  . Smokeless tobacco: Never Used     Comment:  06/21/2013 "quit smoking ~ 1986"  . Alcohol Use: No     Comment: 06/21/2013 "quit drinking ~ 1970"  . Drug Use: Yes    Special: Marijuana     Comment: 06/21/2013 "stopped using marijuana ~ 1970"  . Sexual Activity: Not Currently   Other Topics Concern  . Not on file   Social History Narrative   Married and has 1 son.    Lives in [place].  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   Family History  Problem Relation Age of Onset  . Heart disease Mother     mother also had Rheumatism.  Marland Kitchen Heart attack Neg Hx   . Stroke Neg Hx   . Hypertension Mother    ROS: All systems reviewed and negative except as per HPI.   Current Outpatient Prescriptions  Medication Sig Dispense Refill  . Ascorbic Acid (VITAMIN C) 1000 MG tablet Take 1,000 mg by mouth every morning.     . budesonide-formoterol (SYMBICORT) 160-4.5 MCG/ACT inhaler Inhale 2 puffs into the lungs 2 (two) times daily. 3 Inhaler 1  . calcitRIOL (ROCALTROL) 0.5 MCG capsule Take 0.5 mcg by mouth every other day.     . carvedilol (COREG) 3.125 MG tablet Take 1 tablet (3.125 mg total) by mouth 2 (two) times daily. 180 tablet 3  . digoxin (DIGITEK) 0.125 MG tablet Take 1 tablet (125 mcg  total) by mouth every morning. 90 tablet 3  . Febuxostat (ULORIC) 80 MG TABS Take 80 mg by mouth every morning.     . levalbuterol (XOPENEX HFA) 45 MCG/ACT inhaler Inhale 2 puffs into the lungs every 6 (six) hours as needed for wheezing.    . Olopatadine HCl (PATADAY) 0.2 % SOLN Place 1 drop into both eyes every morning.    . potassium chloride SA (K-DUR,KLOR-CON) 20 MEQ tablet Take 1 tablet (20 mEq total) by mouth daily. 90 tablet 3  . pregabalin (LYRICA) 150 MG capsule Take 150 mg by mouth 2 (two) times daily.      . sotalol (BETAPACE) 160 MG tablet Take 1 tablet (160 mg total) by mouth daily. 90 tablet 3  . Tamsulosin HCl (FLOMAX) 0.4 MG CAPS Take 0.4 mg by mouth every morning.     . tiotropium (SPIRIVA) 18 MCG inhalation capsule Place 1 capsule (18 mcg total) into  inhaler and inhale every morning. 90 capsule 1  . torsemide (DEMADEX) 20 MG tablet Take 2 tablets (40 mg total) by mouth daily. 180 tablet 3  . warfarin (COUMADIN) 5 MG tablet Take as directed by coumadin clinic (Patient taking differently: 5-7.5 mg. Tuesday take  5 mg and every day besides Tuesday take 7.5 mg) 150 tablet 0   No current facility-administered medications for this encounter.   BP 118/78 mmHg  Pulse 91  Wt 278 lb (126.1 kg)  SpO2 91%   Wt Readings from Last 3 Encounters:  12/19/15 278 lb (126.1 kg)  10/17/15 285 lb 3.2 oz (129.366 kg)  09/05/15 276 lb 12 oz (125.533 kg)    General: NAD, obese. Wife present  Neck: JVP 6-7 cm, no thyromegaly or thyroid nodule.  Lungs: Clear. On 2 liters [place]. CV: Nondisplaced PMI.  Heart regular S1/S2, no S3/S4, no murmur.  Trace - 1+ LE edema.  No carotid bruit.  Normal pedal pulses.  Abdomen: Obese, NT, ND, no HSM. No bruits or masses. +BS  Skin: Intact without lesions or rashes.  Neurologic: Alert and oriented x 3.  Psych: Normal affect. Extremities: No clubbing or cyanosis. R and LLE trace-1+ HEENT: Normal.   Assessment/Plan: 1. Atrial fibrillation: In A fib today . Last INR was in April. Missed May INR check. Plan to check INR today and check INR  Weekly for 1 month. If remains in A Fib will need DC-CV.  - Continue sotalol per EP. Allergic to amiodarone  - Continue warfarin per coumadin clinic.  2. COPD: On home oxygen 2L chronically.  3. Chronic systolic CHF: EF 0000000, nonischemic cardiomyopathy.  MDT CRT-D device.   - Volume status stable. NYHA class III symptoms.  Continue torsemide 40 mg and KCl 20. Can take an extra 20 mg torsemide as needed for weight gain.  - In the past he was on bidil but has stopped and does not want to restart. Could consider low dose lisinopril but hold off for now.  - Continue digoxin. Level 0.5 on 09/05/15. - No ACEI/ARB for now with elevated creatinine. - Continue Coreg 3.125 mg bid. No room to  up-titrate with soft pressure. - Reinforced fluid restriction to < 2 L daily, sodium restriction to less than 2000 mg daily, and the importance of daily weights.  4. CKD: Stage III- Creatinine baseline ~1.7  5. Hx of DVT - on warfarin as above. Missed INR check in May.   Check INR today then weekly for 4 weeks. Set up DC-CV after INR therapeutic INR for 1  month.  Follow up with Dr Aundra Dubin in 4-5 weeks.   Camari Quintanilla NP-C  12/19/2015

## 2016-01-09 NOTE — Progress Notes (Signed)
  Echocardiogram Echocardiogram Transesophageal has been performed.  Maurice Delgado 01/09/2016, 2:05 PM

## 2016-01-09 NOTE — Transfer of Care (Signed)
Immediate Anesthesia Transfer of Care Note  Patient: Maurice Delgado  Procedure(s) Performed: Procedure(s): TRANSESOPHAGEAL ECHOCARDIOGRAM (TEE) (N/A) CARDIOVERSION (N/A)  Patient Location: Endoscopy Unit  Anesthesia Type:MAC  Level of Consciousness: awake, alert  and sedated  Airway & Oxygen Therapy: Patient connected to nasal cannula oxygen  Post-op Assessment: Post -op Vital signs reviewed and stable  Post vital signs: stable  Last Vitals:  Filed Vitals:   01/09/16 1030  BP: 126/84  Pulse: 71  Temp: 36.8 C  Resp: 20    Last Pain: There were no vitals filed for this visit.       Complications: No apparent anesthesia complications

## 2016-01-09 NOTE — Anesthesia Preprocedure Evaluation (Addendum)
Anesthesia Evaluation  Patient identified by MRN, date of birth, ID band Patient awake    Reviewed: Allergy & Precautions, NPO status , Patient's Chart, lab work & pertinent test results  Airway Mallampati: II  TM Distance: >3 FB Neck ROM: Full    Dental no notable dental hx.    Pulmonary sleep apnea and Continuous Positive Airway Pressure Ventilation , COPD,  oxygen dependent, former smoker,  Pulmonary fibrosis   Pulmonary exam normal breath sounds clear to auscultation       Cardiovascular hypertension, Pt. on medications +CHF and + DVT  PERIPHERAL VASCULAR DISEASE: IVC filter.  Normal cardiovascular exam+ dysrhythmias Atrial Fibrillation + Cardiac Defibrillator  Rhythm:Regular Rate:Normal     Neuro/Psych negative neurological ROS  negative psych ROS   GI/Hepatic negative GI ROS, Neg liver ROS, GERD  ,  Endo/Other  negative endocrine ROS  Renal/GU Renal disease  negative genitourinary   Musculoskeletal negative musculoskeletal ROS (+)   Abdominal   Peds negative pediatric ROS (+)  Hematology negative hematology ROS (+)   Anesthesia Other Findings   Reproductive/Obstetrics negative OB ROS                            Anesthesia Physical Anesthesia Plan  ASA: III  Anesthesia Plan: MAC   Post-op Pain Management:    Induction:   Airway Management Planned: Natural Airway and Nasal Cannula  Additional Equipment:   Intra-op Plan:   Post-operative Plan:   Informed Consent: I have reviewed the patients History and Physical, chart, labs and discussed the procedure including the risks, benefits and alternatives for the proposed anesthesia with the patient or authorized representative who has indicated his/her understanding and acceptance.   Dental advisory given  Plan Discussed with: CRNA  Anesthesia Plan Comments:         Anesthesia Quick Evaluation

## 2016-01-18 ENCOUNTER — Ambulatory Visit (INDEPENDENT_AMBULATORY_CARE_PROVIDER_SITE_OTHER): Payer: Medicare Other | Admitting: *Deleted

## 2016-01-18 DIAGNOSIS — Z5181 Encounter for therapeutic drug level monitoring: Secondary | ICD-10-CM | POA: Diagnosis not present

## 2016-01-18 DIAGNOSIS — I480 Paroxysmal atrial fibrillation: Secondary | ICD-10-CM

## 2016-01-18 LAB — POCT INR: INR: 2.8

## 2016-01-23 ENCOUNTER — Encounter (HOSPITAL_COMMUNITY): Payer: Medicare Other

## 2016-02-01 ENCOUNTER — Encounter (INDEPENDENT_AMBULATORY_CARE_PROVIDER_SITE_OTHER): Payer: Self-pay

## 2016-02-01 ENCOUNTER — Ambulatory Visit (INDEPENDENT_AMBULATORY_CARE_PROVIDER_SITE_OTHER): Payer: Medicare Other

## 2016-02-01 DIAGNOSIS — Z5181 Encounter for therapeutic drug level monitoring: Secondary | ICD-10-CM

## 2016-02-01 DIAGNOSIS — I480 Paroxysmal atrial fibrillation: Secondary | ICD-10-CM

## 2016-02-01 LAB — POCT INR: INR: 2.2

## 2016-02-07 DIAGNOSIS — I5020 Unspecified systolic (congestive) heart failure: Secondary | ICD-10-CM | POA: Diagnosis not present

## 2016-02-07 DIAGNOSIS — E119 Type 2 diabetes mellitus without complications: Secondary | ICD-10-CM | POA: Diagnosis not present

## 2016-02-07 DIAGNOSIS — E039 Hypothyroidism, unspecified: Secondary | ICD-10-CM | POA: Diagnosis not present

## 2016-02-15 ENCOUNTER — Other Ambulatory Visit: Payer: Self-pay | Admitting: Adult Health

## 2016-02-15 NOTE — Telephone Encounter (Signed)
ATC pt x 3, line rang a few times but then line went silent. WCB.

## 2016-02-15 NOTE — Telephone Encounter (Signed)
ATC pt received busy signal. [facility]

## 2016-02-15 NOTE — Telephone Encounter (Signed)
Patient calling back regarding refill. He can be reached at [phone]

## 2016-02-15 NOTE — Telephone Encounter (Signed)
Pt returned call. Pt requesting spiriva sent to [facility]. Informed pt that a 90 supply with 1 refill was sent to [facility] in 10/2015. Advised pt to call [facility] to have order delivered and to call back if there are any issues. Pt verbalized understanding and denied any further questions or concerns at this time.

## 2016-02-19 ENCOUNTER — Ambulatory Visit: Payer: Medicare Other | Admitting: Pulmonary Disease

## 2016-02-26 ENCOUNTER — Ambulatory Visit (INDEPENDENT_AMBULATORY_CARE_PROVIDER_SITE_OTHER): Payer: Medicare Other | Admitting: Adult Health

## 2016-02-26 ENCOUNTER — Encounter: Payer: Self-pay | Admitting: Adult Health

## 2016-02-26 DIAGNOSIS — J449 Chronic obstructive pulmonary disease, unspecified: Secondary | ICD-10-CM

## 2016-02-26 DIAGNOSIS — J9611 Chronic respiratory failure with hypoxia: Secondary | ICD-10-CM

## 2016-02-26 DIAGNOSIS — J9610 Chronic respiratory failure, unspecified whether with hypoxia or hypercapnia: Secondary | ICD-10-CM | POA: Insufficient documentation

## 2016-02-26 NOTE — Patient Instructions (Addendum)
Continue on Symbicort ad Spiriva .  Continue on Oxygen 2l/m  Order for POC Oxygen device.  Follow up with Dr. Halford Chessman  In 6 months and As needed

## 2016-02-26 NOTE — Addendum Note (Signed)
Addended by: Osa Craver on: 02/26/2016 02:57 PM   Modules accepted: Orders

## 2016-02-26 NOTE — Progress Notes (Signed)
Subjective:    Patient ID: Maurice Delgado, male    DOB: 1933-09-21, 82 y.o.   MRN: [id number]  HPI 82 yo male Former smoker with COPD with emphysema and chronic hypoxic respiratory failure Previously noted to have obstructive sleep apnea, however, repeat sleep study showed no significant obstructive sleep apnea in 05/2015 Patient has combined CHF , echo 2016 showed an EF of 20-25%. He has nonischemic cardiomyopathy. Transesophageal echo July 2017 showed an EF of 30% with diffuse hypokinesis.  02/26/2016 follow-up COPD and chronic hypoxic respiratory failure Patient returns for a 1 year follow-up.  Says overall that his breathing has been doing okay. He gets short of breath with walking. Denies a flare cough or wheezing. Patient remains on Symbicort and Spiriva. Remains on 2 L of oxygen. Patient does request a lighter weight portable oxygen concentrator. He says his current one is too heavy to lift. We discussed a pneumonia vaccine today. He declined. Also, discussed the flu shot, which she also declined at this time -says he will  get it later next month  He has congestive heart failure. He is followed by cardiology. Says his chronic leg swelling is about the same.  Past Medical History:  Diagnosis Date  . Atrial tachycardia ([place])    a. s/p ablation 05/2013.  Marland Kitchen Chronic kidney disease (CKD), stage III (moderate)   . Chronic respiratory failure ([place])    a. On home O2 - 2-3L 24/7  . Chronic systolic CHF (congestive heart failure) ([place])   . COPD (chronic obstructive pulmonary disease) ([place])   . DVT (deep venous thrombosis) ([place])    a. Recurrent DVTs. b. s/p IVC filter 2007. c. DVTs dx 05/2013 - started back on Coumadin  . GERD (gastroesophageal reflux disease)   . Gout   . Hemoptysis    a. H/o of hemoptysis with pulmonary fibrosis (patient has refused biopsies in the past).  Marland Kitchen HTN (hypertension)   . 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  . Left fibular fracture    a. 03/2013 -  nonsurgical mgmt.  . Nonischemic cardiomyopathy ([place])    a. EF 35%. b. s/p CRT-D device implanted - initially 2002 in [place], gen change 2011 (MDT).  . OSA on CPAP   . PAF (paroxysmal atrial fibrillation) ([place])   . Retroperitoneal bleed    a. Remotely on Coumadin (per Dr. Jackalyn Lombard prior office note).   Current Outpatient Prescriptions on File Prior to Visit  Medication Sig Dispense Refill  . Ascorbic Acid (VITAMIN C) 1000 MG tablet Take 1,000 mg by mouth every morning.     . budesonide-formoterol (SYMBICORT) 160-4.5 MCG/ACT inhaler Inhale 2 puffs into the lungs 2 (two) times daily. 3 Inhaler 1  . calcitRIOL (ROCALTROL) 0.5 MCG capsule Take 0.5 mcg by mouth every other day.     . carvedilol (COREG) 3.125 MG tablet Take 1 tablet (3.125 mg total) by mouth 2 (two) times daily. 180 tablet 3  . digoxin (DIGITEK) 0.125 MG tablet Take 1 tablet (125 mcg total) by mouth every morning. 90 tablet 3  . Febuxostat (ULORIC) 80 MG TABS Take 80 mg by mouth every morning.     . levalbuterol (XOPENEX HFA) 45 MCG/ACT inhaler Inhale 2 puffs into the lungs every 6 (six) hours as needed for wheezing.    . potassium chloride SA (K-DUR,KLOR-CON) 20 MEQ tablet Take 1 tablet (20 mEq total) by mouth daily. 90 tablet 3  . pregabalin (LYRICA) 150 MG capsule Take 150 mg by mouth 2 (two)  times daily.      . sotalol (BETAPACE) 160 MG tablet Take 1 tablet (160 mg total) by mouth daily. 90 tablet 3  . Tamsulosin HCl (FLOMAX) 0.4 MG CAPS Take 0.4 mg by mouth every morning.     . tiotropium (SPIRIVA) 18 MCG inhalation capsule Place 1 capsule (18 mcg total) into inhaler and inhale every morning. 90 capsule 1  . torsemide (DEMADEX) 20 MG tablet Take 2 tablets (40 mg total) by mouth daily. 180 tablet 3  . warfarin (COUMADIN) 5 MG tablet Take as directed by coumadin clinic (Patient taking differently: 5-7.5 mg. Tuesday take  5 mg and every day besides Tuesday take 7.5 mg) 150 tablet 0  . Olopatadine HCl (PATADAY) 0.2 % SOLN Place 1 drop  into both eyes every morning.     No current facility-administered medications on file prior to visit.      Review of Systems Constitutional:   No  weight loss, night sweats,  Fevers, chills,  +fatigue, or  lassitude.  HEENT:   No headaches,  Difficulty swallowing,  Tooth/dental problems, or  Sore throat,                No sneezing, itching, ear ache, nasal congestion, post nasal drip,   CV:  No chest pain,  Orthopnea, PND,+swelling in lower extremities,  No anasarca, dizziness, palpitations, syncope.   GI  No heartburn, indigestion, abdominal pain, nausea, vomiting, diarrhea, change in bowel habits, loss of appetite, bloody stools.   Resp:   No chest wall deformity  Skin: no rash or lesions.  GU: no dysuria, change in color of urine, no urgency or frequency.  No flank pain, no hematuria   MS:  No joint pain or swelling.  No decreased range of motion.  No back pain.  Psych:  No change in mood or affect. No depression or anxiety.  No memory loss.         Objective:   Physical Exam  Vitals:   02/26/16 1415  BP: 122/76  Pulse: 75  Temp: 97.5 F (36.4 C)  TempSrc: Oral  SpO2: 95%  Weight: 283 lb (128.4 kg)  Height: 6\' 3"  (1.905 m)   GEN: A/Ox3; pleasant , NAD,obese    HEENT:  [place]/AT,  EACs-clear, TMs-wnl, NOSE-clear, THROAT-clear, no lesions, no postnasal drip or exudate noted. Class 2 MP airway   NECK:  Supple w/ fair ROM; no JVD; normal carotid impulses w/o bruits; no thyromegaly or nodules palpated; no lymphadenopathy.    RESP  Clear  P & A; w/o, wheezes/ rales/ or rhonchi. no accessory muscle use, no dullness to percussion  CARD:  RRR, no m/r/g  , 2+ peripheral edema, pulses intact, no cyanosis or clubbing.  GI:   Soft & nt; nml bowel sounds; no organomegaly or masses detected.   Musco: Warm bil, no deformities or joint swelling noted.   Neuro: alert, no focal deficits noted.    Skin: Warm, no lesions or rashes  Kamerin Grumbine NP-C  [facility]  02/26/2016       Assessment & Plan:

## 2016-02-26 NOTE — Assessment & Plan Note (Signed)
Continue On oxygen. Order sent to DME for a evaluation for portable oxygen concentrator. This lighter weight

## 2016-02-26 NOTE — Assessment & Plan Note (Signed)
Compensated on present regimen.   Plan  Patient Instructions  Continue on Symbicort ad Spiriva .  Continue on Oxygen 2l/m  Order for POC Oxygen device.  Follow up with Dr. Halford Chessman  In 6 months and As needed

## 2016-02-27 NOTE — Progress Notes (Signed)
Reviewed and agree with assessment/plan.  Chesley Mires, MD [facility] 02/27/2016, 9:00 AM Pager:  [phone]

## 2016-02-28 ENCOUNTER — Encounter (HOSPITAL_COMMUNITY): Payer: Self-pay

## 2016-02-28 ENCOUNTER — Ambulatory Visit (HOSPITAL_COMMUNITY)
Admission: RE | Admit: 2016-02-28 | Discharge: 2016-02-28 | Disposition: A | Discharge status: Home / Self Care | Payer: Medicare Other | Source: Ambulatory Visit | Attending: Cardiology | Admitting: Cardiology

## 2016-02-28 VITALS — BP 128/76 | HR 88 | Wt 283.5 lb

## 2016-02-28 DIAGNOSIS — Z8249 Family history of ischemic heart disease and other diseases of the circulatory system: Secondary | ICD-10-CM | POA: Diagnosis not present

## 2016-02-28 DIAGNOSIS — Z9981 Dependence on supplemental oxygen: Secondary | ICD-10-CM | POA: Diagnosis not present

## 2016-02-28 DIAGNOSIS — Z79899 Other long term (current) drug therapy: Secondary | ICD-10-CM | POA: Insufficient documentation

## 2016-02-28 DIAGNOSIS — Z95810 Presence of automatic (implantable) cardiac defibrillator: Secondary | ICD-10-CM | POA: Insufficient documentation

## 2016-02-28 DIAGNOSIS — I480 Paroxysmal atrial fibrillation: Secondary | ICD-10-CM | POA: Diagnosis not present

## 2016-02-28 DIAGNOSIS — I428 Other cardiomyopathies: Secondary | ICD-10-CM | POA: Insufficient documentation

## 2016-02-28 DIAGNOSIS — I442 Atrioventricular block, complete: Secondary | ICD-10-CM | POA: Diagnosis not present

## 2016-02-28 DIAGNOSIS — Z888 Allergy status to other drugs, medicaments and biological substances status: Secondary | ICD-10-CM | POA: Insufficient documentation

## 2016-02-28 DIAGNOSIS — Z86718 Personal history of other venous thrombosis and embolism: Secondary | ICD-10-CM | POA: Insufficient documentation

## 2016-02-28 DIAGNOSIS — N183 Chronic kidney disease, stage 3 unspecified: Secondary | ICD-10-CM

## 2016-02-28 DIAGNOSIS — I5022 Chronic systolic (congestive) heart failure: Secondary | ICD-10-CM | POA: Diagnosis not present

## 2016-02-28 DIAGNOSIS — M109 Gout, unspecified: Secondary | ICD-10-CM | POA: Insufficient documentation

## 2016-02-28 DIAGNOSIS — Z7901 Long term (current) use of anticoagulants: Secondary | ICD-10-CM | POA: Diagnosis not present

## 2016-02-28 DIAGNOSIS — K219 Gastro-esophageal reflux disease without esophagitis: Secondary | ICD-10-CM | POA: Insufficient documentation

## 2016-02-28 DIAGNOSIS — Z7951 Long term (current) use of inhaled steroids: Secondary | ICD-10-CM | POA: Diagnosis not present

## 2016-02-28 DIAGNOSIS — Z87891 Personal history of nicotine dependence: Secondary | ICD-10-CM | POA: Insufficient documentation

## 2016-02-28 DIAGNOSIS — J449 Chronic obstructive pulmonary disease, unspecified: Secondary | ICD-10-CM | POA: Insufficient documentation

## 2016-02-28 DIAGNOSIS — G4733 Obstructive sleep apnea (adult) (pediatric): Secondary | ICD-10-CM | POA: Diagnosis not present

## 2016-02-28 LAB — DIGOXIN LEVEL: Digoxin Level: 0.5 ng/mL — ABNORMAL LOW (ref 0.8–2.0)

## 2016-02-28 LAB — BASIC METABOLIC PANEL
Anion gap: 8 (ref 5–15)
BUN: 17 mg/dL (ref 6–20)
CHLORIDE: 104 mmol/L (ref 101–111)
CO2: 30 mmol/L (ref 22–32)
CREATININE: 1.44 mg/dL — AB (ref 0.61–1.24)
Calcium: 9.4 mg/dL (ref 8.9–10.3)
GFR calc non Af Amer: 44 mL/min — ABNORMAL LOW (ref >60)
GFR, EST AFRICAN AMERICAN: 51 mL/min — AB (ref >60)
Glucose, Bld: 119 mg/dL — ABNORMAL HIGH (ref 65–99)
Potassium: 4.2 mmol/L (ref 3.5–5.1)
Sodium: 142 mmol/L (ref 135–145)

## 2016-02-28 MED ORDER — CARVEDILOL 6.25 MG PO TABS: 6.2500 mg | ORAL_TABLET | Freq: Two times a day (BID) | ORAL | 3 refills | Status: DC

## 2016-02-28 NOTE — Progress Notes (Signed)
Patient ID: Maurice Delgado, male   DOB: [birth year], 82 y.o.   MRN: [id number]    Advanced Heart Failure Clinic Note   PCP: Dr. Jonelle Sidle HF Cardiology: Dr Aundra Dubin  82 yo with history of chronic systolic CHF due to NICM, CHB s/p Medtronic CRT-D 2002 with generator change 2011, HTN, history of DVT on chronic anticoag with coumadin, COPD, PAF, CKD stage III and hx of OSA.  He went into atrial fibrillation in 7/17 and had TEE-guided DCCV.  He appears to be in NSR today.   He returns today for follow up. Wears 2 liters oxygen daily for COPD.  Weight is up about 5 lbs.  Main complaint is difficulty with balance.  No falls.  Lightheaded if he stands too fast.  Dyspnea walking into office or after walking about 100 feet (this is chronic).    ECG: ?NSR (low voltage Ps appear to be present), BiV pacing, QTc 512 msec.   Optivol: After DCCV, had recurrent atrial fibrillation for a couple of days but has been in NSR since around 7/15.  Nearly 100% BiV pacing.  Fluid index < threshold and thoracic impedance stable.    Labs (1/17): K 3.6, creatinine 1.56 Labs 07/20/2015: K 4.5 Creatinine 2.33, digoxin 0.5  Labs (2/17): K 4.3, creatinine 2.24 Labs (3/17) K, 4.4, Creatinine 1.73, Dig 0.5 Lab 10/12/2015: INR 2.4  Labs (10/17/2015): K 4.1 Creatinine 1.77  PMH: 1. Sciatica 2. H/o DVT: Recurrent.  Has IVC filter 3. Complete heart block: Medtronic CRT-D device placed 2002.  4. Atrial fibrillation: Paroxysmal. On sotalol to maintain NSR.  - TEE-guided DCCV in 7/17.  5. OSA: No longer uses CPAP.  6. Chronic systolic CHF: Echo (123456) with EF 20-25%, mildly dilated LV, normal RV size and systolic function, mild MR.  Nonischemic cardiomyopathy.  Has MDT CRT-D device.  - Hypotension with low dose Bidil.  - TEE (7/17): EF 30%, diffuse hypokinesis, mildly dilated RV with mildly decreased systolic function.  7. COPD: On home oxygen.  8. History of retroperitoneal hemorrhage.  9. CKD: Stage III.  10. Gout 11. GERD 12.  Atrial tachycardia: s/p ablation 2014.  13. Sleep study negative  Social History   Social History  . Marital status: Married    Spouse name: N/A  . Number of children: N/A  . Years of education: N/A   Occupational History  . reitred     from education   Social History Main Topics  . Smoking status: Former Smoker    Packs/day: 1.50    Years: 30.00    Types: Cigarettes  . Smokeless tobacco: Never Used     Comment: 06/21/2013 "quit smoking ~ 1986"  . Alcohol use No     Comment: 06/21/2013 "quit drinking ~ 1970"  . Drug use:     Types: Marijuana     Comment: 06/21/2013 "stopped using marijuana ~ 1970"  . Sexual activity: Not Currently   Other Topics Concern  . Not on file   Social History Narrative   Married and has 1 son.    Lives in [place].  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   Family History  Problem Relation Age of Onset  . Heart disease Mother     mother also had Rheumatism.  Marland Kitchen Heart attack Neg Hx   . Stroke Neg Hx   . Hypertension Mother    ROS: All systems reviewed and negative except as per HPI.   Current Outpatient Prescriptions  Medication Sig Dispense Refill  . Ascorbic Acid (VITAMIN  C) 1000 MG tablet Take 1,000 mg by mouth every morning.     . budesonide-formoterol (SYMBICORT) 160-4.5 MCG/ACT inhaler Inhale 2 puffs into the lungs 2 (two) times daily. 3 Inhaler 1  . calcitRIOL (ROCALTROL) 0.5 MCG capsule Take 0.5 mcg by mouth every other day.     . carvedilol (COREG) 6.25 MG tablet Take 1 tablet (6.25 mg total) by mouth 2 (two) times daily. 60 tablet 3  . digoxin (DIGITEK) 0.125 MG tablet Take 1 tablet (125 mcg total) by mouth every morning. 90 tablet 3  . Febuxostat (ULORIC) 80 MG TABS Take 80 mg by mouth every morning.     . levalbuterol (XOPENEX HFA) 45 MCG/ACT inhaler Inhale 2 puffs into the lungs every 6 (six) hours as needed for wheezing.    . potassium chloride SA (K-DUR,KLOR-CON) 20 MEQ tablet Take 1 tablet (20 mEq total) by mouth daily. 90 tablet 3    . pregabalin (LYRICA) 150 MG capsule Take 150 mg by mouth 2 (two) times daily.      . sotalol (BETAPACE) 160 MG tablet Take 1 tablet (160 mg total) by mouth daily. 90 tablet 3  . Tamsulosin HCl (FLOMAX) 0.4 MG CAPS Take 0.4 mg by mouth every morning.     . tiotropium (SPIRIVA) 18 MCG inhalation capsule Place 1 capsule (18 mcg total) into inhaler and inhale every morning. 90 capsule 1  . torsemide (DEMADEX) 20 MG tablet Take 2 tablets (40 mg total) by mouth daily. 180 tablet 3  . warfarin (COUMADIN) 5 MG tablet Take as directed by coumadin clinic (Patient taking differently: 5-7.5 mg. Tuesday take  5 mg and every day besides Tuesday take 7.5 mg) 150 tablet 0   No current facility-administered medications for this encounter.    BP 128/76   Pulse 88   Wt 283 lb 8 oz (128.6 kg)   SpO2 96% Comment: on 2L of O2  BMI 35.44 kg/m    Wt Readings from Last 3 Encounters:  02/28/16 283 lb 8 oz (128.6 kg)  02/26/16 283 lb (128.4 kg)  01/09/16 278 lb (126.1 kg)    General: NAD, obese. Wife present  Neck: JVP difficult, probably not elevated, no thyromegaly or thyroid nodule.  Lungs: Clear. On 2 liters [place].  CV: Nondisplaced PMI.  Heart regular S1/S2, no S3/S4, no murmur.  1+ ankle edema.  No carotid bruit.  Normal pedal pulses.  Abdomen: Obese, NT, ND, no HSM. No bruits or masses. +BS  Skin: Intact without lesions or rashes.  Neurologic: Alert and oriented x 3.  Psych: Normal affect. Extremities: No clubbing or cyanosis. HEENT: Normal.   Assessment/Plan: 1. Atrial fibrillation: Paroxysmal. TEE-guided DCCV in 7/17. Today's ECG is probably NSR with small, diseased-appearing P waves.   - Continue sotalol per EP. Allergic to amiodarone.  QTc is 512 msec.  I will not change sotalol today but will need to follow ECG closely.  - Continue warfarin per coumadin clinic.  2. COPD: On home oxygen 2L chronically.  As he does not appear volume overloaded, I think that COPD may play a significant role in his  symptoms.  3. Chronic systolic CHF: EF 0000000, nonischemic cardiomyopathy.  MDT CRT-D device.  Volume stable by exam and Optivol. NYHA class III symptoms.   - Continue torsemide 40 mg and KCl 20. Can take an extra 20 mg torsemide as needed for weight gain. BMET today.  - He was on Bidil in the past but this was stopped, he thinks because of low  blood pressure.   - Continue digoxin. Check level today.  - No ACEI/ARB for now with elevated creatinine. - Increase Coreg to 6.25 mg bid.  - Reinforced fluid restriction to < 2 L daily, sodium restriction to less than 2000 mg daily, and the importance of daily weights.  4. CKD: Stage III. Creatinine baseline ~1.7.  BMET today.  5. Hx of DVT: On warfarin as above.   Followup in 6 wks.  Loralie Champagne 02/28/2016

## 2016-02-28 NOTE — Patient Instructions (Signed)
Increase Carvedilol to 6.25 mg Twice daily   Labs today  Your physician recommends that you schedule a follow-up appointment in: 6 weeks

## 2016-03-12 ENCOUNTER — Encounter: Payer: Self-pay | Admitting: Internal Medicine

## 2016-03-12 DIAGNOSIS — Z950 Presence of cardiac pacemaker: Secondary | ICD-10-CM | POA: Diagnosis not present

## 2016-03-12 DIAGNOSIS — J449 Chronic obstructive pulmonary disease, unspecified: Secondary | ICD-10-CM | POA: Diagnosis not present

## 2016-03-12 DIAGNOSIS — N2581 Secondary hyperparathyroidism of renal origin: Secondary | ICD-10-CM | POA: Diagnosis not present

## 2016-03-12 DIAGNOSIS — N183 Chronic kidney disease, stage 3 (moderate): Secondary | ICD-10-CM | POA: Diagnosis not present

## 2016-03-12 DIAGNOSIS — I480 Paroxysmal atrial fibrillation: Secondary | ICD-10-CM | POA: Diagnosis not present

## 2016-03-12 DIAGNOSIS — G4733 Obstructive sleep apnea (adult) (pediatric): Secondary | ICD-10-CM | POA: Diagnosis not present

## 2016-03-12 DIAGNOSIS — N2889 Other specified disorders of kidney and ureter: Secondary | ICD-10-CM | POA: Diagnosis not present

## 2016-03-12 DIAGNOSIS — I770 Arteriovenous fistula, acquired: Secondary | ICD-10-CM | POA: Diagnosis not present

## 2016-03-12 DIAGNOSIS — D631 Anemia in chronic kidney disease: Secondary | ICD-10-CM | POA: Diagnosis not present

## 2016-03-12 DIAGNOSIS — Z8679 Personal history of other diseases of the circulatory system: Secondary | ICD-10-CM | POA: Diagnosis not present

## 2016-03-12 DIAGNOSIS — Z86718 Personal history of other venous thrombosis and embolism: Secondary | ICD-10-CM | POA: Diagnosis not present

## 2016-03-14 ENCOUNTER — Telehealth (HOSPITAL_COMMUNITY): Payer: Self-pay | Admitting: *Deleted

## 2016-03-14 NOTE — Telephone Encounter (Signed)
Pt called to let us know that Dr Deterding increased his Torsemide to 60 mg daily, med list updated

## 2016-03-21 ENCOUNTER — Ambulatory Visit (INDEPENDENT_AMBULATORY_CARE_PROVIDER_SITE_OTHER): Payer: Medicare Other | Admitting: *Deleted

## 2016-03-21 DIAGNOSIS — I480 Paroxysmal atrial fibrillation: Secondary | ICD-10-CM | POA: Diagnosis not present

## 2016-03-21 DIAGNOSIS — Z5181 Encounter for therapeutic drug level monitoring: Secondary | ICD-10-CM | POA: Diagnosis not present

## 2016-03-21 LAB — POCT INR: INR: 2.3

## 2016-03-26 DIAGNOSIS — I129 Hypertensive chronic kidney disease with stage 1 through stage 4 chronic kidney disease, or unspecified chronic kidney disease: Secondary | ICD-10-CM | POA: Diagnosis not present

## 2016-03-28 ENCOUNTER — Telehealth (HOSPITAL_COMMUNITY): Payer: Self-pay | Admitting: Cardiology

## 2016-03-28 NOTE — Telephone Encounter (Signed)
Pt aware and voiced understanding  Pt reports he has a follow up scheduled with Detterding on 04/09/16 and is already scheduled for labs.  Advised normally a faxed copy of OV is sent over to our office so Aundra Dubin is able to follow along with therapy/treatment. Be sure to ask [facility] to also send copy of labs. Pt voiced understanding

## 2016-03-28 NOTE — Telephone Encounter (Signed)
Patient called with medication concerns Patient reports he was recently seen by Dr. Detterding and torsemide was increased to 60/40, patient was only taking 40 daily with an additional tab as needed for swelling  Please confirm patient should continue this dose as he will not start until cleared until he gets the ok from CHF team  **copy of visit in correspondence  folder**

## 2016-03-28 NOTE — Telephone Encounter (Signed)
Can increase as Dr Deterding requested but needs BMET in 1 week.

## 2016-04-04 ENCOUNTER — Telehealth (HOSPITAL_COMMUNITY): Payer: Self-pay | Admitting: Vascular Surgery

## 2016-04-04 ENCOUNTER — Other Ambulatory Visit (HOSPITAL_COMMUNITY): Payer: Self-pay | Admitting: *Deleted

## 2016-04-04 DIAGNOSIS — I5022 Chronic systolic (congestive) heart failure: Secondary | ICD-10-CM

## 2016-04-04 MED ORDER — CARVEDILOL 6.25 MG PO TABS: 6.2500 mg | ORAL_TABLET | Freq: Two times a day (BID) | ORAL | 6 refills | Status: DC

## 2016-04-04 NOTE — Telephone Encounter (Signed)
PLEASE CALL PT ABOUT CARVEDILOL PRESCRIPTION

## 2016-04-09 ENCOUNTER — Other Ambulatory Visit (HOSPITAL_COMMUNITY): Payer: Self-pay | Admitting: *Deleted

## 2016-04-10 ENCOUNTER — Encounter (HOSPITAL_COMMUNITY): Payer: Medicare Other

## 2016-04-11 ENCOUNTER — Ambulatory Visit (HOSPITAL_COMMUNITY)
Admission: RE | Admit: 2016-04-11 | Discharge: 2016-04-11 | Disposition: A | Discharge status: Home / Self Care | Payer: Medicare Other | Source: Ambulatory Visit | Attending: Cardiology | Admitting: Cardiology

## 2016-04-11 ENCOUNTER — Encounter: Payer: Self-pay | Admitting: Cardiology

## 2016-04-11 VITALS — BP 124/86 | HR 86 | Wt 286.8 lb

## 2016-04-11 DIAGNOSIS — I129 Hypertensive chronic kidney disease with stage 1 through stage 4 chronic kidney disease, or unspecified chronic kidney disease: Secondary | ICD-10-CM | POA: Diagnosis not present

## 2016-04-11 DIAGNOSIS — I5022 Chronic systolic (congestive) heart failure: Secondary | ICD-10-CM | POA: Diagnosis not present

## 2016-04-11 DIAGNOSIS — M109 Gout, unspecified: Secondary | ICD-10-CM | POA: Insufficient documentation

## 2016-04-11 DIAGNOSIS — I429 Cardiomyopathy, unspecified: Secondary | ICD-10-CM | POA: Insufficient documentation

## 2016-04-11 DIAGNOSIS — G4733 Obstructive sleep apnea (adult) (pediatric): Secondary | ICD-10-CM | POA: Insufficient documentation

## 2016-04-11 DIAGNOSIS — I442 Atrioventricular block, complete: Secondary | ICD-10-CM | POA: Insufficient documentation

## 2016-04-11 DIAGNOSIS — Z9889 Other specified postprocedural states: Secondary | ICD-10-CM | POA: Diagnosis not present

## 2016-04-11 DIAGNOSIS — Z9981 Dependence on supplemental oxygen: Secondary | ICD-10-CM | POA: Insufficient documentation

## 2016-04-11 DIAGNOSIS — I482 Chronic atrial fibrillation: Secondary | ICD-10-CM | POA: Insufficient documentation

## 2016-04-11 DIAGNOSIS — Z79899 Other long term (current) drug therapy: Secondary | ICD-10-CM | POA: Insufficient documentation

## 2016-04-11 DIAGNOSIS — Z86718 Personal history of other venous thrombosis and embolism: Secondary | ICD-10-CM | POA: Insufficient documentation

## 2016-04-11 DIAGNOSIS — N183 Chronic kidney disease, stage 3 unspecified: Secondary | ICD-10-CM

## 2016-04-11 DIAGNOSIS — J449 Chronic obstructive pulmonary disease, unspecified: Secondary | ICD-10-CM | POA: Insufficient documentation

## 2016-04-11 DIAGNOSIS — K219 Gastro-esophageal reflux disease without esophagitis: Secondary | ICD-10-CM | POA: Diagnosis not present

## 2016-04-11 DIAGNOSIS — Z7901 Long term (current) use of anticoagulants: Secondary | ICD-10-CM | POA: Insufficient documentation

## 2016-04-11 DIAGNOSIS — Z87891 Personal history of nicotine dependence: Secondary | ICD-10-CM | POA: Diagnosis not present

## 2016-04-11 DIAGNOSIS — I480 Paroxysmal atrial fibrillation: Secondary | ICD-10-CM | POA: Diagnosis not present

## 2016-04-11 NOTE — Patient Instructions (Signed)
No changes to medications today.  No lab work today.  Try to walk 15 minutes twice daily.  Follow up 3 months with Dr. Aundra Dubin.  Do the following things EVERYDAY: 1) Weigh yourself in the morning before breakfast. Write it down and keep it in a log. 2) Take your medicines as prescribed 3) Eat low salt foods-Limit salt (sodium) to 2000 mg per day.  4) Stay as active as you can everyday 5) Limit all fluids for the day to less than 2 liters

## 2016-04-11 NOTE — Progress Notes (Signed)
Patient ID: Maurice Delgado, male   DOB: 07/22/1933, 82 y.o.   MRN: [id number]    Advanced Heart Failure Clinic Note   PCP: Dr. Jonelle Sidle HF Cardiology: Dr Aundra Dubin Nephrology: Dr Deterding  82 yo with history of chronic systolic CHF due to NICM, CHB s/p Medtronic CRT-D 2002 with generator change 2011, HTN, history of DVT on chronic anticoag with coumadin, COPD, PAF, CKD stage III and hx of OSA.  He went into atrial fibrillation in 7/17 and had TEE-guided DCCV.  He appears to be in NSR today.   He returns today for follow up. Since the last visit Neprology increased torsemide to 60 mg/20 mg. Wears 2 liters oxygen daily for COPD. Limited activity. Says he spends most of his time in the chair. Ambulates with a rolling walker. Not weighing every day.No bleeding problems. Taking all medications.    Optivol: Fluid well below threshold. Activity less than 1 hour   Labs (1/17): K 3.6, creatinine 1.56 Labs 07/20/2015: K 4.5 Creatinine 2.33, digoxin 0.5  Labs (2/17): K 4.3, creatinine 2.24 Labs (3/17) K, 4.4, Creatinine 1.73, Dig 0.5 Lab 10/12/2015: INR 2.4  Labs (10/17/2015): K 4.1 Creatinine 1.77 Labs 01/2016: dig level 0.5 K 4.2 Creatinine 1.44  PMH: 1. Sciatica 2. H/o DVT: Recurrent.  Has IVC filter 3. Complete heart block: Medtronic CRT-D device placed 2002.  4. Atrial fibrillation: Paroxysmal. On sotalol to maintain NSR.  - TEE-guided DCCV in 7/17.  5. OSA: No longer uses CPAP.  6. Chronic systolic CHF: Echo (123456) with EF 20-25%, mildly dilated LV, normal RV size and systolic function, mild MR.  Nonischemic cardiomyopathy.  Has MDT CRT-D device.  - Hypotension with low dose Bidil.  - TEE (7/17): EF 30%, diffuse hypokinesis, mildly dilated RV with mildly decreased systolic function.  7. COPD: On home oxygen.  8. History of retroperitoneal hemorrhage.  9. CKD: Stage III.  10. Gout 11. GERD 12. Atrial tachycardia: s/p ablation 2014.  13. Sleep study negative  Social History   Social  History  . Marital status: Married    Spouse name: N/A  . Number of children: N/A  . Years of education: N/A   Occupational History  . reitred     from education   Social History Main Topics  . Smoking status: Former Smoker    Packs/day: 1.50    Years: 30.00    Types: Cigarettes  . Smokeless tobacco: Never Used     Comment: 06/21/2013 "quit smoking ~ 1986"  . Alcohol use No     Comment: 06/21/2013 "quit drinking ~ 1970"  . Drug use:     Types: Marijuana     Comment: 06/21/2013 "stopped using marijuana ~ 1970"  . Sexual activity: Not Currently   Other Topics Concern  . Not on file   Social History Narrative   Married and has 1 son.    Lives in [place].  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   Family History  Problem Relation Age of Onset  . Heart disease Mother     mother also had Rheumatism.  Marland Kitchen Heart attack Neg Hx   . Stroke Neg Hx   . Hypertension Mother    ROS: All systems reviewed and negative except as per HPI.   Current Outpatient Prescriptions  Medication Sig Dispense Refill  . Ascorbic Acid (VITAMIN C) 1000 MG tablet Take 1,000 mg by mouth every morning.     . budesonide-formoterol (SYMBICORT) 160-4.5 MCG/ACT inhaler Inhale 2 puffs into the lungs 2 (two) times  daily. 3 Inhaler 1  . calcitRIOL (ROCALTROL) 0.5 MCG capsule Take 0.5 mcg by mouth every other day.     . carvedilol (COREG) 6.25 MG tablet Take 1 tablet (6.25 mg total) by mouth 2 (two) times daily. 60 tablet 6  . digoxin (DIGITEK) 0.125 MG tablet Take 1 tablet (125 mcg total) by mouth every morning. 90 tablet 3  . Febuxostat (ULORIC) 80 MG TABS Take 80 mg by mouth every morning.     . levalbuterol (XOPENEX HFA) 45 MCG/ACT inhaler Inhale 2 puffs into the lungs every 6 (six) hours as needed for wheezing.    . potassium chloride SA (K-DUR,KLOR-CON) 20 MEQ tablet Take 1 tablet (20 mEq total) by mouth daily. 90 tablet 3  . pregabalin (LYRICA) 150 MG capsule Take 150 mg by mouth 2 (two) times daily.      . sotalol  (BETAPACE) 160 MG tablet Take 1 tablet (160 mg total) by mouth daily. 90 tablet 3  . Tamsulosin HCl (FLOMAX) 0.4 MG CAPS Take 0.4 mg by mouth every morning.     . tiotropium (SPIRIVA) 18 MCG inhalation capsule Place 1 capsule (18 mcg total) into inhaler and inhale every morning. 90 capsule 1  . torsemide (DEMADEX) 20 MG tablet Take 60 mg by mouth daily.    Marland Kitchen warfarin (COUMADIN) 5 MG tablet Take as directed by coumadin clinic (Patient taking differently: 5-7.5 mg. Tuesday take  5 mg and every day besides Tuesday take 7.5 mg) 150 tablet 0   No current facility-administered medications for this encounter.    BP 124/86   Pulse 86   Wt 286 lb 12.8 oz (130.1 kg)   SpO2 90%   BMI 35.85 kg/m    Wt Readings from Last 3 Encounters:  04/11/16 286 lb 12.8 oz (130.1 kg)  02/28/16 283 lb 8 oz (128.6 kg)  02/26/16 283 lb (128.4 kg)    General: NAD, obese. Ambulated in the clinic with a rolling walker.   Neck: JVP difficult, probably not elevated, no thyromegaly or thyroid nodule.  Lungs: Clear. On 2 liters [place].  CV: Nondisplaced PMI.  Heart regular S1/S2, no S3/S4, no murmur.  L>R 1+>trace  ankle edema.  No carotid bruit.  Normal pedal pulses.  Abdomen: Obese, NT, ND, no HSM. No bruits or masses. +BS  Skin: Intact without lesions or rashes.  Neurologic: Alert and oriented x 3.  Psych: Normal affect. Extremities: No clubbing or cyanosis. HEENT: Normal.   Assessment/Plan: 1. Atrial fibrillation: Paroxysmal. TEE-guided DCCV in 7/17.  Continue sotalol per EP. Allergic to amiodarone.   Continue warfarin per coumadin clinic.  2. COPD: On home oxygen 2L chronically.  COPD may play a significant role in his symptoms.  3. Chronic systolic CHF: EF 0000000, nonischemic cardiomyopathy.  MDT CRT-D device.   Volume stable by exam and Optivol.  NYHA class III symptoms.  Continue torsemide 60 mg /20 mg and KCl 20. Can take an extra 20 mg torsemide as needed for weight gain.   - He was on Bidil in the past but  this was stopped, he thinks because of low blood pressure.   - Continue digoxin. Recent dig level stable.   - No ACEI/ARB for now with elevated creatinine. -Continue  Coreg to 6.25 mg bid.  - Reinforced fluid restriction to < 2 L daily, sodium restriction to less than 2000 mg daily, and the importance of daily weights.  4. CKD: Stage III. Creatinine baseline ~1.7.  Creatinine 1.44 on 02/28/16. Had BMET earlier  with Nephrology 5. Hx of DVT: On warfarin as above.   Follow up in 3 months.   Amy Clegg NP-C  04/11/2016

## 2016-04-24 ENCOUNTER — Encounter: Payer: Self-pay | Admitting: Internal Medicine

## 2016-04-25 DIAGNOSIS — Z23 Encounter for immunization: Secondary | ICD-10-CM | POA: Diagnosis not present

## 2016-05-02 ENCOUNTER — Ambulatory Visit (INDEPENDENT_AMBULATORY_CARE_PROVIDER_SITE_OTHER): Payer: Medicare Other | Admitting: *Deleted

## 2016-05-02 DIAGNOSIS — I480 Paroxysmal atrial fibrillation: Secondary | ICD-10-CM | POA: Diagnosis not present

## 2016-05-02 DIAGNOSIS — Z5181 Encounter for therapeutic drug level monitoring: Secondary | ICD-10-CM

## 2016-05-02 LAB — POCT INR: INR: 3.1

## 2016-05-16 ENCOUNTER — Telehealth: Payer: Self-pay | Admitting: Cardiology

## 2016-05-16 NOTE — Telephone Encounter (Signed)
I advised Maurice Delgado this pt is followed by Dr Aundra Dubin at the [facility] and have given her the phone number to contact him there.

## 2016-05-16 NOTE — Telephone Encounter (Signed)
New Message:     Please call Maurice Delgado,concerning his Torsemide.

## 2016-05-22 ENCOUNTER — Other Ambulatory Visit (HOSPITAL_COMMUNITY): Payer: Self-pay | Admitting: Pharmacist

## 2016-05-22 MED ORDER — TORSEMIDE 20 MG PO TABS: 40.0000 mg | ORAL_TABLET | Freq: Two times a day (BID) | ORAL | 3 refills | Status: DC

## 2016-05-22 MED ORDER — TORSEMIDE 20 MG PO TABS: 40.0000 mg | ORAL_TABLET | Freq: Two times a day (BID) | ORAL | 0 refills | Status: DC

## 2016-06-06 ENCOUNTER — Ambulatory Visit (INDEPENDENT_AMBULATORY_CARE_PROVIDER_SITE_OTHER): Payer: Medicare Other | Admitting: *Deleted

## 2016-06-06 DIAGNOSIS — Z5181 Encounter for therapeutic drug level monitoring: Secondary | ICD-10-CM

## 2016-06-06 DIAGNOSIS — I480 Paroxysmal atrial fibrillation: Secondary | ICD-10-CM | POA: Diagnosis not present

## 2016-06-06 LAB — POCT INR: INR: 2.1

## 2016-06-20 ENCOUNTER — Telehealth (HOSPITAL_COMMUNITY): Payer: Self-pay | Admitting: Pharmacist

## 2016-06-20 MED ORDER — TORSEMIDE 20 MG PO TABS: 40.0000 mg | ORAL_TABLET | Freq: Two times a day (BID) | ORAL | 1 refills | Status: DC

## 2016-06-20 NOTE — Telephone Encounter (Signed)
Mr. Rocchi came to HF clinic today stating that the electronic Rx for torsemide that we sent to [facility] mail order pharmacy on 11/22 was not received per the representative at [facility]. I have called [facility] and they stated that the torsemide was delivered on 11/28 (#450). Mr. Walmsley states that he did not receive them but will contact [facility] in an attempt to resolve the issue. Of note, he did change insurances to [employer] so in the future he will get his Rx's through [facility] mail order pharmacy.   Ruta Hinds. Velva Harman, PharmD, BCPS, CPP Clinical Pharmacist Pager: [phone] Phone: [phone] 06/20/2016 12:11 PM

## 2016-07-09 DIAGNOSIS — Z7901 Long term (current) use of anticoagulants: Secondary | ICD-10-CM | POA: Diagnosis not present

## 2016-07-09 DIAGNOSIS — E785 Hyperlipidemia, unspecified: Secondary | ICD-10-CM | POA: Diagnosis not present

## 2016-07-09 DIAGNOSIS — E782 Mixed hyperlipidemia: Secondary | ICD-10-CM | POA: Diagnosis not present

## 2016-07-09 DIAGNOSIS — R600 Localized edema: Secondary | ICD-10-CM | POA: Diagnosis not present

## 2016-07-09 DIAGNOSIS — I82401 Acute embolism and thrombosis of unspecified deep veins of right lower extremity: Secondary | ICD-10-CM | POA: Diagnosis not present

## 2016-07-23 ENCOUNTER — Ambulatory Visit (HOSPITAL_COMMUNITY)
Admission: RE | Admit: 2016-07-23 | Discharge: 2016-07-23 | Disposition: A | Discharge status: Home / Self Care | Payer: Medicare Other | Source: Ambulatory Visit | Attending: Cardiology | Admitting: Cardiology

## 2016-07-23 ENCOUNTER — Encounter (HOSPITAL_COMMUNITY): Payer: Self-pay

## 2016-07-23 VITALS — BP 128/74 | HR 88 | Wt 292.8 lb

## 2016-07-23 DIAGNOSIS — K219 Gastro-esophageal reflux disease without esophagitis: Secondary | ICD-10-CM | POA: Insufficient documentation

## 2016-07-23 DIAGNOSIS — Z86718 Personal history of other venous thrombosis and embolism: Secondary | ICD-10-CM | POA: Insufficient documentation

## 2016-07-23 DIAGNOSIS — Z9981 Dependence on supplemental oxygen: Secondary | ICD-10-CM | POA: Insufficient documentation

## 2016-07-23 DIAGNOSIS — Z8249 Family history of ischemic heart disease and other diseases of the circulatory system: Secondary | ICD-10-CM | POA: Diagnosis not present

## 2016-07-23 DIAGNOSIS — Z79899 Other long term (current) drug therapy: Secondary | ICD-10-CM | POA: Diagnosis not present

## 2016-07-23 DIAGNOSIS — N183 Chronic kidney disease, stage 3 unspecified: Secondary | ICD-10-CM

## 2016-07-23 DIAGNOSIS — Z9889 Other specified postprocedural states: Secondary | ICD-10-CM | POA: Diagnosis not present

## 2016-07-23 DIAGNOSIS — I5022 Chronic systolic (congestive) heart failure: Secondary | ICD-10-CM | POA: Insufficient documentation

## 2016-07-23 DIAGNOSIS — I442 Atrioventricular block, complete: Secondary | ICD-10-CM | POA: Diagnosis not present

## 2016-07-23 DIAGNOSIS — M109 Gout, unspecified: Secondary | ICD-10-CM | POA: Diagnosis not present

## 2016-07-23 DIAGNOSIS — I429 Cardiomyopathy, unspecified: Secondary | ICD-10-CM | POA: Diagnosis not present

## 2016-07-23 DIAGNOSIS — Z7901 Long term (current) use of anticoagulants: Secondary | ICD-10-CM | POA: Diagnosis not present

## 2016-07-23 DIAGNOSIS — G4733 Obstructive sleep apnea (adult) (pediatric): Secondary | ICD-10-CM | POA: Diagnosis not present

## 2016-07-23 DIAGNOSIS — I519 Heart disease, unspecified: Secondary | ICD-10-CM | POA: Diagnosis not present

## 2016-07-23 DIAGNOSIS — R0600 Dyspnea, unspecified: Secondary | ICD-10-CM

## 2016-07-23 DIAGNOSIS — J449 Chronic obstructive pulmonary disease, unspecified: Secondary | ICD-10-CM | POA: Insufficient documentation

## 2016-07-23 DIAGNOSIS — Z823 Family history of stroke: Secondary | ICD-10-CM | POA: Insufficient documentation

## 2016-07-23 DIAGNOSIS — I959 Hypotension, unspecified: Secondary | ICD-10-CM | POA: Insufficient documentation

## 2016-07-23 DIAGNOSIS — Z87891 Personal history of nicotine dependence: Secondary | ICD-10-CM | POA: Insufficient documentation

## 2016-07-23 DIAGNOSIS — L97919 Non-pressure chronic ulcer of unspecified part of right lower leg with unspecified severity: Secondary | ICD-10-CM | POA: Insufficient documentation

## 2016-07-23 DIAGNOSIS — I480 Paroxysmal atrial fibrillation: Secondary | ICD-10-CM | POA: Insufficient documentation

## 2016-07-23 LAB — BASIC METABOLIC PANEL
ANION GAP: 8 (ref 5–15)
BUN: 34 mg/dL — ABNORMAL HIGH (ref 6–20)
CALCIUM: 9.6 mg/dL (ref 8.9–10.3)
CO2: 33 mmol/L — AB (ref 22–32)
Chloride: 101 mmol/L (ref 101–111)
Creatinine, Ser: 1.71 mg/dL — ABNORMAL HIGH (ref 0.61–1.24)
GFR calc non Af Amer: 36 mL/min — ABNORMAL LOW (ref >60)
GFR, EST AFRICAN AMERICAN: 42 mL/min — AB (ref >60)
Glucose, Bld: 98 mg/dL (ref 65–99)
POTASSIUM: 4.1 mmol/L (ref 3.5–5.1)
Sodium: 142 mmol/L (ref 135–145)

## 2016-07-23 LAB — BRAIN NATRIURETIC PEPTIDE: B Natriuretic Peptide: 268.9 pg/mL — ABNORMAL HIGH (ref 0.0–100.0)

## 2016-07-23 LAB — DIGOXIN LEVEL: DIGOXIN LVL: 1 ng/mL (ref 0.8–2.0)

## 2016-07-23 MED ORDER — TORSEMIDE 20 MG PO TABS: ORAL_TABLET | ORAL | 3 refills | Status: DC

## 2016-07-23 MED ORDER — SPIRONOLACTONE 25 MG PO TABS: 12.5000 mg | ORAL_TABLET | Freq: Every day | ORAL | 3 refills | Status: DC

## 2016-07-23 NOTE — Patient Instructions (Signed)
Stop Potassium  Start Spironolactone 12.5 mg (1/2 tab) daily  Increase Torsemide to 80 mg (4 tabs) in AM and 40 mg (2 tabs) in PM  Labs today  Labs in 1 week  Your physician has requested that you have a lower extremity arterial exercise duplex. During this test, exercise and ultrasound are used to evaluate arterial blood flow in the legs. Allow one hour for this exam. There are no restrictions or special instructions.  You have been referred to [facility] have been referred to Cardiac Rehab, they will call you to schedule  Your physician recommends that you schedule a follow-up appointment in: 1 month

## 2016-07-24 ENCOUNTER — Telehealth (HOSPITAL_COMMUNITY): Payer: Self-pay | Admitting: *Deleted

## 2016-07-24 DIAGNOSIS — I5022 Chronic systolic (congestive) heart failure: Secondary | ICD-10-CM

## 2016-07-24 NOTE — Telephone Encounter (Signed)
Notes Recorded by Scarlette Calico, RN on 07/24/2016 at 3:48 PM EST Will check trough dig level on 1/30 w/repeat bmet, pt's wife is aware

## 2016-07-24 NOTE — Progress Notes (Signed)
Patient ID: Joshue Haecker, male   DOB: [birth year], 83 y.o.   MRN: [id number]    Advanced Heart Failure Clinic Note   PCP: Dr. Jonelle Sidle HF Cardiology: Dr Aundra Dubin Nephrology: Dr Deterding  83 yo with history of chronic systolic CHF due to NICM, CHB s/p Medtronic CRT-D 2002 with generator change 2011, HTN, history of DVT on chronic anticoag with coumadin, COPD, PAF, CKD stage III and hx of OSA.  He went into atrial fibrillation in 7/17 and had TEE-guided DCCV.  He remains in NSR today.    He returns today for followup. Wears 2 liters oxygen daily for COPD. Very limited activity. Says he spends most of his time in the chair. Ambulates with a rolling walker. No BRBPR or melena.  Significant lower extremity edema with weeping ulcer on right shin.  He is short of breath walking to the mailbox.  He has orthopnea, sleeping in recliner for > 1 year.   Labs (1/17): K 3.6, creatinine 1.56 Labs 07/20/2015: K 4.5 Creatinine 2.33, digoxin 0.5  Labs (2/17): K 4.3, creatinine 2.24 Labs (3/17) K, 4.4, Creatinine 1.73, Dig 0.5 Lab (10/12/2015): INR 2.4  Labs (10/17/2015): K 4.1 Creatinine 1.77 Labs (01/2016): dig level 0.5 K 4.2 Creatinine 1.44 Labs (10/17): K 3.8, creatinine 1.71  ECG: NSR, BiV pacing, QTc 500 msec  PMH: 1. Sciatica 2. H/o DVT: Recurrent.  Has IVC filter 3. Complete heart block: Medtronic CRT-D device placed 2002.  4. Atrial fibrillation: Paroxysmal. On sotalol to maintain NSR.  - TEE-guided DCCV in 7/17.  5. OSA: No longer uses CPAP.  6. Chronic systolic CHF: Echo (123456) with EF 20-25%, mildly dilated LV, normal RV size and systolic function, mild MR.  Nonischemic cardiomyopathy.  Has MDT CRT-D device.  - Hypotension with low dose Bidil.  - TEE (7/17): EF 30%, diffuse hypokinesis, mildly dilated RV with mildly decreased systolic function.  7. COPD: On home oxygen.  8. History of retroperitoneal hemorrhage.  9. CKD: Stage III.  10. Gout 11. GERD 12. Atrial tachycardia: s/p ablation  2014.  13. Sleep study negative  Social History   Social History  . Marital status: Married    Spouse name: N/A  . Number of children: N/A  . Years of education: N/A   Occupational History  . reitred     from education   Social History Main Topics  . Smoking status: Former Smoker    Packs/day: 1.50    Years: 30.00    Types: Cigarettes  . Smokeless tobacco: Never Used     Comment: 06/21/2013 "quit smoking ~ 1986"  . Alcohol use No     Comment: 06/21/2013 "quit drinking ~ 1970"  . Drug use: Yes    Types: Marijuana     Comment: 06/21/2013 "stopped using marijuana ~ 1970"  . Sexual activity: Not Currently   Other Topics Concern  . Not on file   Social History Narrative   Married and has 1 son.    Lives in [place].  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   Family History  Problem Relation Age of Onset  . Heart disease Mother     mother also had Rheumatism.  Marland Kitchen Heart attack Neg Hx   . Stroke Neg Hx   . Hypertension Mother    ROS: All systems reviewed and negative except as per HPI.   Current Outpatient Prescriptions  Medication Sig Dispense Refill  . Ascorbic Acid (VITAMIN C) 1000 MG tablet Take 1,000 mg by mouth every morning.     Marland Kitchen  budesonide-formoterol (SYMBICORT) 160-4.5 MCG/ACT inhaler Inhale 2 puffs into the lungs 2 (two) times daily. 3 Inhaler 1  . calcitRIOL (ROCALTROL) 0.5 MCG capsule Take 0.5 mcg by mouth every other day.     . carvedilol (COREG) 6.25 MG tablet Take 1 tablet (6.25 mg total) by mouth 2 (two) times daily. 60 tablet 6  . digoxin (DIGITEK) 0.125 MG tablet Take 1 tablet (125 mcg total) by mouth every morning. 90 tablet 3  . Febuxostat (ULORIC) 80 MG TABS Take 80 mg by mouth every morning.     . levalbuterol (XOPENEX HFA) 45 MCG/ACT inhaler Inhale 2 puffs into the lungs every 6 (six) hours as needed for wheezing.    . pregabalin (LYRICA) 150 MG capsule Take 150 mg by mouth 2 (two) times daily.      . sotalol (BETAPACE) 160 MG tablet Take 1 tablet (160 mg  total) by mouth daily. 90 tablet 3  . spironolactone (ALDACTONE) 25 MG tablet Take 0.5 tablets (12.5 mg total) by mouth daily. 15 tablet 3  . Tamsulosin HCl (FLOMAX) 0.4 MG CAPS Take 0.4 mg by mouth every morning.     . tiotropium (SPIRIVA) 18 MCG inhalation capsule Place 1 capsule (18 mcg total) into inhaler and inhale every morning. 90 capsule 1  . torsemide (DEMADEX) 20 MG tablet Take 4 tablets (80 mg) in the morning and 2 tablets (40 mg) in the afternoon 120 tablet 3  . warfarin (COUMADIN) 5 MG tablet Take as directed by coumadin clinic (Patient taking differently: 5-7.5 mg. Take 7.5 mg daily except 5 mg on Tuesday) 150 tablet 0   No current facility-administered medications for this encounter.    BP 128/74 (BP Location: Right Arm, Patient Position: Sitting, Cuff Size: Large)   Pulse 88   Wt 292 lb 12.8 oz (132.8 kg)   SpO2 90% Comment: on 3L  BMI 36.60 kg/m    Wt Readings from Last 3 Encounters:  07/23/16 292 lb 12.8 oz (132.8 kg)  04/11/16 286 lb 12.8 oz (130.1 kg)  02/28/16 283 lb 8 oz (128.6 kg)    General: NAD, obese. Ambulated in the clinic with a rolling walker.   Neck: JVP 9-10 cm, no thyromegaly or thyroid nodule.  Lungs: Clear. On 2 liters [place].  CV: Nondisplaced PMI.  Heart regular S1/S2, no S3/S4, no murmur.  2+ edema to knees bilaterally with weeping ulceration on right lower leg.  No carotid bruit.  Unable to palpate pedal pulses.  Abdomen: Obese, NT, ND, no HSM. No bruits or masses. +BS  Skin: Intact without lesions or rashes.  Neurologic: Alert and oriented x 3.  Psych: Normal affect. Extremities: No clubbing or cyanosis. HEENT: Normal.   Assessment/Plan: 1. Atrial fibrillation: Paroxysmal. TEE-guided DCCV in 7/17.  Continue sotalol. Allergic to amiodarone.   Continue warfarin per coumadin clinic.  2. COPD: On home oxygen 2L chronically.  COPD may play a significant role in his symptoms.  3. Chronic systolic CHF: EF 0000000, nonischemic cardiomyopathy.  MDT CRT-D  device.  NYHA class III symptoms.  He is volume overloaded on exam.  - Increase torsemide to 80 qam/40 qpm with BMET/BNP today and repeat BMET in 10 days.  - Bidil was stopped due to symptomatic low BP.    - Continue digoxin, check level.    - No ACEI/ARB for now with elevated creatinine. - Continue  Coreg to 6.25 mg bid.  - Start spironolactone 12.5 mg daily and stop KCl supplement.  - Very sedentary, refer to cardiac  rehab.  4. CKD: Stage III. Creatinine baseline ~1.7.  BMET today.  5. Hx of DVT: On warfarin as above.  6. Right lower leg ulceration: Weeping.  - I will refer to wound care clinic.  Once the wound is treated properly, I would like him to get leg wraps to help with the peripheral edema.  - Unable to palpate pedal pulses, I will arrange for peripheral arterial doppler evaluation.   Followup in 1 month.  Loralie Champagne 07/24/2016

## 2016-07-24 NOTE — Telephone Encounter (Signed)
-----   Message from Larey Dresser, MD sent at 07/24/2016  9:27 AM EST ----- Stable compared to prior.  Will need to follow digoxin closely but level yesterday was not a trough.

## 2016-07-30 ENCOUNTER — Other Ambulatory Visit (HOSPITAL_COMMUNITY): Payer: Self-pay | Admitting: *Deleted

## 2016-07-30 ENCOUNTER — Inpatient Hospital Stay (HOSPITAL_COMMUNITY): Admission: RE | Admit: 2016-07-30 | Payer: Medicare Other | Source: Ambulatory Visit

## 2016-07-30 DIAGNOSIS — I519 Heart disease, unspecified: Secondary | ICD-10-CM

## 2016-07-30 DIAGNOSIS — H16213 Exposure keratoconjunctivitis, bilateral: Secondary | ICD-10-CM | POA: Diagnosis not present

## 2016-07-31 ENCOUNTER — Encounter: Payer: Medicare Other | Attending: Internal Medicine | Admitting: Internal Medicine

## 2016-07-31 DIAGNOSIS — I509 Heart failure, unspecified: Secondary | ICD-10-CM | POA: Insufficient documentation

## 2016-07-31 DIAGNOSIS — Z7901 Long term (current) use of anticoagulants: Secondary | ICD-10-CM | POA: Diagnosis not present

## 2016-07-31 DIAGNOSIS — I87331 Chronic venous hypertension (idiopathic) with ulcer and inflammation of right lower extremity: Secondary | ICD-10-CM | POA: Insufficient documentation

## 2016-07-31 DIAGNOSIS — I442 Atrioventricular block, complete: Secondary | ICD-10-CM | POA: Insufficient documentation

## 2016-07-31 DIAGNOSIS — Z87891 Personal history of nicotine dependence: Secondary | ICD-10-CM | POA: Insufficient documentation

## 2016-07-31 DIAGNOSIS — L97211 Non-pressure chronic ulcer of right calf limited to breakdown of skin: Secondary | ICD-10-CM | POA: Insufficient documentation

## 2016-07-31 DIAGNOSIS — K219 Gastro-esophageal reflux disease without esophagitis: Secondary | ICD-10-CM | POA: Diagnosis not present

## 2016-07-31 DIAGNOSIS — J449 Chronic obstructive pulmonary disease, unspecified: Secondary | ICD-10-CM | POA: Insufficient documentation

## 2016-07-31 DIAGNOSIS — I4891 Unspecified atrial fibrillation: Secondary | ICD-10-CM | POA: Insufficient documentation

## 2016-07-31 DIAGNOSIS — N183 Chronic kidney disease, stage 3 (moderate): Secondary | ICD-10-CM | POA: Diagnosis not present

## 2016-07-31 DIAGNOSIS — I890 Lymphedema, not elsewhere classified: Secondary | ICD-10-CM | POA: Diagnosis not present

## 2016-07-31 DIAGNOSIS — I130 Hypertensive heart and chronic kidney disease with heart failure and stage 1 through stage 4 chronic kidney disease, or unspecified chronic kidney disease: Secondary | ICD-10-CM | POA: Diagnosis not present

## 2016-07-31 DIAGNOSIS — Z950 Presence of cardiac pacemaker: Secondary | ICD-10-CM | POA: Diagnosis not present

## 2016-07-31 DIAGNOSIS — Z86718 Personal history of other venous thrombosis and embolism: Secondary | ICD-10-CM | POA: Insufficient documentation

## 2016-07-31 DIAGNOSIS — L97812 Non-pressure chronic ulcer of other part of right lower leg with fat layer exposed: Secondary | ICD-10-CM | POA: Diagnosis not present

## 2016-07-31 DIAGNOSIS — M109 Gout, unspecified: Secondary | ICD-10-CM | POA: Diagnosis not present

## 2016-07-31 DIAGNOSIS — I87311 Chronic venous hypertension (idiopathic) with ulcer of right lower extremity: Secondary | ICD-10-CM | POA: Diagnosis not present

## 2016-08-01 ENCOUNTER — Ambulatory Visit (HOSPITAL_COMMUNITY)
Admission: RE | Admit: 2016-08-01 | Discharge: 2016-08-01 | Disposition: A | Discharge status: Home / Self Care | Payer: Medicare Other | Source: Ambulatory Visit | Attending: Internal Medicine | Admitting: Internal Medicine

## 2016-08-01 DIAGNOSIS — I5022 Chronic systolic (congestive) heart failure: Secondary | ICD-10-CM | POA: Insufficient documentation

## 2016-08-01 DIAGNOSIS — I519 Heart disease, unspecified: Secondary | ICD-10-CM | POA: Diagnosis not present

## 2016-08-01 LAB — BASIC METABOLIC PANEL
Anion gap: 12 (ref 5–15)
BUN: 36 mg/dL — ABNORMAL HIGH (ref 6–20)
CHLORIDE: 96 mmol/L — AB (ref 101–111)
CO2: 34 mmol/L — AB (ref 22–32)
Calcium: 9.7 mg/dL (ref 8.9–10.3)
Creatinine, Ser: 2.11 mg/dL — ABNORMAL HIGH (ref 0.61–1.24)
GFR calc non Af Amer: 28 mL/min — ABNORMAL LOW (ref >60)
GFR, EST AFRICAN AMERICAN: 32 mL/min — AB (ref >60)
GLUCOSE: 102 mg/dL — AB (ref 65–99)
Potassium: 4 mmol/L (ref 3.5–5.1)
Sodium: 142 mmol/L (ref 135–145)

## 2016-08-01 LAB — DIGOXIN LEVEL: Digoxin Level: <0.2 ng/mL — ABNORMAL LOW (ref 0.8–2.0)

## 2016-08-01 NOTE — Progress Notes (Signed)
Maurice Delgado, Maurice Delgado ([id number]) Visit Report for 07/31/2016 Allergy List Details Patient Name: Maurice Delgado Date of Service: 07/31/2016 9:00 AM Medical Record Number: [id number] Patient Account Number: [id number] Date of Birth/Sex: 1934-03-27 (83 y.o. Male) Treating RN: Afful, RN, BSN, Velva Harman Primary Care Jentry Warnell: Gala Romney Other Clinician: Referring Donnajean Chesnut: Loralie Champagne Treating Mei Suits/Extender: Ricard Dillon Weeks in Treatment: 0 Allergies Active Allergies No known allergies Allergy Notes Electronic Signature(s) Signed: 07/31/2016 4:42:38 PM By: Regan Lemming BSN, RN Entered By: Regan Lemming on 07/31/2016 09:45:02 Maurice Delgado ([id number]) -------------------------------------------------------------------------------- Arrival Information Details Patient Name: Maurice Delgado Date of Service: 07/31/2016 9:00 AM Medical Record Number: [id number] Patient Account Number: [id number] Date of Birth/Sex: [birth year] (83 y.o. Male) Treating RN: Afful, RN, BSN, Velva Harman Primary Care Ashlye Oviedo: Gala Romney Other Clinician: Referring Yatzari Jonsson: Loralie Champagne Treating Nareg Breighner/Extender: Tito Dine in Treatment: 0 Visit Information Patient Arrived: Walker Arrival Time: 09:34 Accompanied By: SELF Transfer Assistance: None Patient Identification Yes Verified: Secondary Verification Yes Process Completed: Patient Has Alerts: Yes Patient Alerts: Patient on [place] Signature(s) Signed: 07/31/2016 4:42:38 PM By: Regan Lemming BSN, RN Entered By: Regan Lemming on 07/31/2016 09:35:07 Maurice Delgado ([id number]) -------------------------------------------------------------------------------- Clinic Level of Care Assessment Details Patient Name: Maurice Delgado Date of Service: 07/31/2016 9:00 AM Medical Record Number: [id number] Patient Account Number: [id number] Date of Birth/Sex: [birth year] (83 y.o. Male) Treating RN:  Afful, RN, BSN, Velva Harman Primary Care Shanasia Ibrahim: Gala Romney Other Clinician: Referring Oden Lindaman: Loralie Champagne Treating Kimbley Sprague/Extender: Tito Dine in Treatment: 0 Clinic Level of Care Assessment Items TOOL 1 Quantity Score []  - Use when EandM and Procedure is performed on INITIAL visit 0 ASSESSMENTS - Nursing Assessment / Reassessment X - General Physical Exam (combine w/ comprehensive assessment (listed just 1 20 below) when performed on new pt. evals) X - Comprehensive Assessment (HX, ROS, Risk Assessments, Wounds Hx, etc.) 1 25 ASSESSMENTS - Wound and Skin Assessment / Reassessment []  - Dermatologic / Skin Assessment (not related to wound area) 0 ASSESSMENTS - Ostomy and/or Continence Assessment and Care []  - Incontinence Assessment and Management 0 []  - Ostomy Care Assessment and Management (repouching, etc.) 0 PROCESS - Coordination of Care X - Simple Patient / Family Education for ongoing care 1 15 []  - Complex (extensive) Patient / Family Education for ongoing care 0 X - Staff obtains Consents, Records, Test Results / Process Orders 1 10 []  - Staff telephones HHA, Nursing Homes / Clarify orders / etc 0 []  - Routine Transfer to another Facility (non-emergent condition) 0 []  - Routine Hospital Admission (non-emergent condition) 0 X - New Admissions / Biomedical engineer / Ordering NPWT, Apligraf, etc. 1 15 []  - Emergency Hospital Admission (emergent condition) 0 PROCESS - Special Needs []  - Pediatric / Minor Patient Management 0 []  - Isolation Patient Management 0 Maurice Delgado, Maurice Delgado ([id number]) []  - Hearing / Language / Visual special needs 0 []  - Assessment of Community assistance (transportation, D/C planning, etc.) 0 []  - Additional assistance / Altered mentation 0 []  - Support Surface(s) Assessment (bed, cushion, seat, etc.) 0 INTERVENTIONS - Miscellaneous []  - External ear exam 0 []  - Patient Transfer (multiple staff / Civil Service fast streamer / Similar  devices) 0 []  - Simple Staple / Suture removal (25 or less) 0 []  - Complex Staple / Suture removal (26 or more) 0 []  - Hypo/Hyperglycemic Management (do not check if billed separately) 0 []  - Ankle / Brachial Index (ABI) - do not check if billed separately 0 Has the patient been seen at the hospital  within the last three years: Yes Total Score: 85 Level Of Care: New/Established - Level 3 Electronic Signature(s) Signed: 07/31/2016 4:42:38 PM By: Regan Lemming BSN, RN Entered By: Regan Lemming on 07/31/2016 10:23:15 Maurice Delgado ([id number]) -------------------------------------------------------------------------------- Encounter Discharge Information Details Patient Name: Maurice Delgado Date of Service: 07/31/2016 9:00 AM Medical Record Number: [id number] Patient Account Number: [id number] Date of Birth/Sex: February 10, 1934 (83 y.o. Male) Treating RN: Afful, RN, BSN, Velva Harman Primary Care Khira Cudmore: Gala Romney Other Clinician: Referring Tennyson Kallen: Loralie Champagne Treating Taja Pentland/Extender: Tito Dine in Treatment: 0 Encounter Discharge Information Items Discharge Pain Level: 0 Discharge Condition: Stable Ambulatory Status: Walker Discharge Destination: Home Transportation: Private Auto Accompanied By: wife Schedule Follow-up Appointment: No Medication Reconciliation completed No and provided to Patient/Care Ramin Zoll: Provided on Clinical Summary of Care: 07/31/2016 Form Type Recipient Paper Patient AI Electronic Signature(s) Signed: 07/31/2016 10:49:28 AM By: Ruthine Dose Entered By: Ruthine Dose on 07/31/2016 10:49:28 Maurice Delgado ([id number]) -------------------------------------------------------------------------------- Lower Extremity Assessment Details Patient Name: Maurice Delgado Date of Service: 07/31/2016 9:00 AM Medical Record Number: [id number] Patient Account Number: [id number] Date of Birth/Sex: 1933-12-27 (83 y.o. Male) Treating RN: Afful,  RN, BSN, [place] Primary Care Kimmora Risenhoover: Gala Romney Other Clinician: Referring Densil Ottey: Loralie Champagne Treating Matina Rodier/Extender: Ricard Dillon Weeks in Treatment: 0 Edema Assessment Assessed: [Left: No] [Right: No] Edema: [Left: Yes] [Right: Yes] Calf Left: Right: Point of Measurement: 41 cm From Medial Instep 54 cm 51 cm Ankle Left: Right: Point of Measurement: 10 cm From Medial Instep 29 cm 29 cm Vascular Assessment Claudication: Claudication Assessment [Left:Intermittent] [Right:Intermittent] Pulses: Dorsalis Pedis Palpable: [Left:No] Posterior Tibial Extremity colors, hair growth, and conditions: Extremity Color: [Left:Hyperpigmented] [Right:Hyperpigmented] Hair Growth on Extremity: [Left:No] [Right:No] Temperature of Extremity: [Left:Warm] [Right:Warm] Capillary Refill: [Left:< 3 seconds] [Right:< 3 seconds] Toe Nail Assessment Left: Right: Thick: Yes Yes Discolored: Yes Yes Deformed: Yes Yes Improper Length and Hygiene: Yes Yes Notes Non Compressible. Pulses not audible Electronic Signature(s) Maurice Delgado, Maurice Delgado ([id number]) Signed: 07/31/2016 10:14:01 AM By: Regan Lemming BSN, RN Entered By: Regan Lemming on 07/31/2016 10:14:01 Maurice Delgado ([id number]) -------------------------------------------------------------------------------- Multi Wound Chart Details Patient Name: Maurice Delgado Date of Service: 07/31/2016 9:00 AM Medical Record Number: [id number] Patient Account Number: [id number] Date of Birth/Sex: [birth year] (83 y.o. Male) Treating RN: Afful, RN, BSN, Velva Harman Primary Care Obrien Huskins: Gala Romney Other Clinician: Referring Bernice Mcauliffe: Loralie Champagne Treating Shelisa Fern/Extender: Tito Dine in Treatment: 0 Vital Signs Height(in): 74 Pulse(bpm): 84 Weight(lbs): 296 Blood Pressure 130/90 (mmHg): Body Mass Index(BMI): 38 Temperature(F): 97.8 Respiratory Rate 20 (breaths/min): Photos: [1:No Photos] [N/A:N/A] Wound Location:  [1:Right Lower Leg - Anterior N/A] Wounding Event: [1:Gradually Appeared] [N/A:N/A] Primary Etiology: [1:Venous Leg Ulcer] [N/A:N/A] Comorbid History: [1:Lymphedema, Chronic Obstructive Pulmonary Disease (COPD), Sleep Apnea, Arrhythmia, Congestive Heart Failure, Deep Vein Thrombosis, Hypertension, Peripheral Arterial Disease, Peripheral Venous Disease, Gout] [N/A:N/A] Date Acquired: [1:07/01/2016] [N/A:N/A] Weeks of Treatment: [1:0] [N/A:N/A] Wound Status: [1:Open] [N/A:N/A] Measurements L x W x D 10x2x0.1 [N/A:N/A] (cm) Area (cm) : [1:15.708] [N/A:N/A] Volume (cm) : [1:1.571] [N/A:N/A] Classification: [1:Full Thickness Without Exposed Support Structures] [N/A:N/A] Exudate Amount: [1:Large] [N/A:N/A] Exudate Type: [1:Serosanguineous] [N/A:N/A] Exudate Color: [1:red, brown] [N/A:N/A] Wound Margin: [1:Distinct, outline attached N/A] Granulation Amount: [1:Small (1-33%)] [N/A:N/A] Granulation Quality: [1:Pink] [N/A:N/A] Necrotic Amount: Large (67-100%) N/A N/A Necrotic Tissue: Eschar, Adherent Slough N/A N/A Exposed Structures: Fascia: No N/A N/A Fat Layer (Subcutaneous Tissue) Exposed: No Tendon: No Muscle: No Joint: No Bone: No Limited to Skin Breakdown Epithelialization: None N/A N/A Debridement: Debridement [id number] N/A N/A [id number]) Pre-procedure 10:20 N/A N/A  Verification/Time Out Taken: Pain Control: Lidocaine 4% Topical N/A N/A Solution Tissue Debrided: Necrotic/Eschar, N/A N/A Fibrin/Slough, Exudates, Subcutaneous Level: Skin/Subcutaneous N/A N/A Tissue Debridement Area (sq 20 N/A N/A cm): Instrument: Curette N/A N/A Bleeding: Minimum N/A N/A Hemostasis Achieved: Pressure N/A N/A Procedural Pain: 0 N/A N/A Post Procedural Pain: 0 N/A N/A Debridement Treatment Procedure was tolerated N/A N/A Response: well Post Debridement 10x2x0.1 N/A N/A Measurements L x W x D (cm) Post Debridement 1.571 N/A N/A Volume: (cm) Periwound Skin Texture: No Abnormalities  Noted N/A N/A Periwound Skin No Abnormalities Noted N/A N/A Moisture: Periwound Skin Color: Hemosiderin Staining: Yes N/A N/A Temperature: No Abnormality N/A N/A Tenderness on No N/A N/A Palpation: Wound Preparation: Ulcer Cleansing: N/A N/A Rinsed/Irrigated with Saline Topical Anesthetic Maurice Delgado, Maurice Delgado ([id number]) Applied: Other: lidocaine 4% Procedures Performed: Debridement N/A N/A Treatment Notes Wound #1 (Right, Anterior Lower Leg) 1. Cleansed with: Cleanse wound with antibacterial soap and water 2. Anesthetic Topical Lidocaine 4% cream to wound bed prior to debridement 3. Peri-wound Care: Moisturizing lotion 4. Dressing Applied: Santyl Ointment 5. Secondary Dressing Applied ABD Pad 7. Secured with 3 Layer Compression System - Right Lower Extremity Electronic Signature(s) Signed: 07/31/2016 5:08:44 PM By: Linton Ham MD Entered By: Linton Ham on 07/31/2016 10:52:04 Maurice Delgado ([id number]) -------------------------------------------------------------------------------- Multi-Disciplinary Care Plan Details Patient Name: Maurice Delgado Date of Service: 07/31/2016 9:00 AM Medical Record Number: [id number] Patient Account Number: [id number] Date of Birth/Sex: 11-16-1933 (84 y.o. Male) Treating RN: Afful, RN, BSN, Velva Harman Primary Care Clayten Allcock: Gala Romney Other Clinician: Referring Chiffon Kittleson: Loralie Champagne Treating Dannon Nguyenthi/Extender: Tito Dine in Treatment: 0 Active Inactive ` Abuse / Safety / Falls / Self Care Management Nursing Diagnoses: Impaired home maintenance Impaired physical mobility Knowledge deficit related to: safety; personal, health (wound), emergency Potential for falls Self care deficit: actual or potential Goals: Patient will remain injury free Date Initiated: 07/31/2016 Target Resolution Date: 10/24/2016 Goal Status: Active Patient/caregiver will verbalize understanding of skin care regimen Date Initiated:  07/31/2016 Target Resolution Date: 10/24/2016 Goal Status: Active Patient/caregiver will verbalize/demonstrate measures taken to improve the patient's personal safety Date Initiated: 07/31/2016 Target Resolution Date: 10/24/2016 Goal Status: Active Patient/caregiver will verbalize/demonstrate measures taken to prevent injury and/or falls Date Initiated: 07/31/2016 Target Resolution Date: 10/24/2016 Goal Status: Active Interventions: Assess fall risk on admission and as needed Assess: immobility, friction, shearing, incontinence upon admission and as needed Assess impairment of mobility on admission and as needed per policy Assess self care needs on admission and as needed Provide education on basic hygiene Provide education on personal and home safety Provide education on safe transfers Notes: Maurice Delgado, Maurice Delgado ([id number]) Orientation to the Wound Care Program Nursing Diagnoses: Knowledge deficit related to the wound healing center program Goals: Patient/caregiver will verbalize understanding of the [place] Date Initiated: 07/31/2016 Target Resolution Date: 10/24/2016 Goal Status: Active Interventions: Provide education on orientation to the wound center Notes: ` Venous Leg Ulcer Nursing Diagnoses: Knowledge deficit related to disease process and management Potential for venous Insuffiency (use before diagnosis confirmed) Goals: Non-invasive venous studies are completed as ordered Date Initiated: 07/31/2016 Target Resolution Date: 10/24/2016 Goal Status: Active Patient will maintain optimal edema control Date Initiated: 07/31/2016 Target Resolution Date: 10/24/2016 Goal Status: Active Patient/caregiver will verbalize understanding of disease process and disease management Date Initiated: 07/31/2016 Target Resolution Date: 10/24/2016 Goal Status: Active Verify adequate tissue perfusion prior to therapeutic compression application Date Initiated:  07/31/2016 Target Resolution Date: 10/24/2016 Goal Status: Active Interventions: Assess peripheral edema status every  visit. Compression as ordered Provide education on venous insufficiency Treatment Activities: Therapeutic compression applied : 07/31/2016 Notes: Maurice Delgado, Maurice Delgado ([id number]) Wound/Skin Impairment Nursing Diagnoses: Impaired tissue integrity Knowledge deficit related to ulceration/compromised skin integrity Goals: Patient/caregiver will verbalize understanding of skin care regimen Date Initiated: 07/31/2016 Target Resolution Date: 10/24/2016 Goal Status: Active Ulcer/skin breakdown will have a volume reduction of 30% by week 4 Date Initiated: 07/31/2016 Target Resolution Date: 10/24/2016 Goal Status: Active Ulcer/skin breakdown will have a volume reduction of 50% by week 8 Date Initiated: 07/31/2016 Target Resolution Date: 10/24/2016 Goal Status: Active Ulcer/skin breakdown will have a volume reduction of 80% by week 12 Date Initiated: 07/31/2016 Target Resolution Date: 10/24/2016 Goal Status: Active Ulcer/skin breakdown will heal within 14 weeks Date Initiated: 07/31/2016 Target Resolution Date: 10/24/2016 Goal Status: Active Interventions: Assess patient/caregiver ability to obtain necessary supplies Assess patient/caregiver ability to perform ulcer/skin care regimen upon admission and as needed Assess ulceration(s) every visit Provide education on ulcer and skin care Treatment Activities: Patient referred to home care : 07/31/2016 Skin care regimen initiated : 07/31/2016 Topical wound management initiated : 07/31/2016 Notes: Electronic Signature(s) Signed: 07/31/2016 4:42:38 PM By: Regan Lemming BSN, RN Entered By: Regan Lemming on 07/31/2016 10:20:52 Maurice Delgado ([id number]) -------------------------------------------------------------------------------- Pain Assessment Details Patient Name: Maurice Delgado Date of Service: 07/31/2016 9:00 AM Medical  Record Number: [id number] Patient Account Number: [id number] Date of Birth/Sex: 1934-03-20 (83 y.o. Male) Treating RN: Afful, RN, BSN, Velva Harman Primary Care Zakarie Sturdivant: Gala Romney Other Clinician: Referring Tyrene Nader: Loralie Champagne Treating Miarose Lippert/Extender: Tito Dine in Treatment: 0 Active Problems Location of Pain Severity and Description of Pain Patient Has Paino No Site Locations With Dressing Change: No Pain Management and Medication Current Pain Management: Electronic Signature(s) Signed: 07/31/2016 4:42:38 PM By: Regan Lemming BSN, RN Entered By: Regan Lemming on 07/31/2016 09:41:30 Maurice Delgado ([id number]) -------------------------------------------------------------------------------- Patient/Caregiver Education Details Patient Name: Maurice Delgado Date of Service: 07/31/2016 9:00 AM Medical Record Number: [id number] Patient Account Number: [id number] Date of Birth/Gender: 1934-04-16 (83 y.o. Male) Treating RN: Afful, RN, BSN, Velva Harman Primary Care Physician: Gala Romney Other Clinician: Referring Physician: Loralie Champagne Treating Physician/Extender: Tito Dine in Treatment: 0 Education Assessment Education Provided To: Patient Education Topics Provided Basic Hygiene: Methods: Explain/Verbal Responses: State content correctly Safety: Methods: Explain/Verbal Responses: State content correctly Venous: Methods: Explain/Verbal, Video Responses: State content correctly Welcome To [facility] [place]: Methods: Explain/Verbal Responses: State content correctly Wound/Skin Impairment: Methods: Explain/Verbal Responses: State content correctly Electronic Signature(s) Signed: 07/31/2016 4:42:38 PM By: Regan Lemming BSN, RN Entered By: Regan Lemming on 07/31/2016 10:52:19 Maurice Delgado ([id number]) -------------------------------------------------------------------------------- Wound Assessment Details Patient Name: Maurice Delgado Date of Service: 07/31/2016 9:00 AM Medical Record Number: [id number] Patient Account Number: [id number] Date of Birth/Sex: 1934/04/05 (83 y.o. Male) Treating RN: Afful, RN, BSN, [place] Primary Care Rhylen Pulido: Gala Romney Other Clinician: Referring Brandie Lopes: Loralie Champagne Treating Onell Mcmath/Extender: Ricard Dillon Weeks in Treatment: 0 Wound Status Wound Number: 1 Primary Venous Leg Ulcer Etiology: Wound Location: Right Lower Leg - Anterior Wound Open Wounding Event: Gradually Appeared Status: Date Acquired: 07/01/2016 Comorbid Lymphedema, Chronic Obstructive Weeks Of Treatment: 0 History: Pulmonary Disease (COPD), Sleep Clustered Wound: No Apnea, Arrhythmia, Congestive Heart Failure, Deep Vein Thrombosis, Hypertension, Peripheral Arterial Disease, Peripheral Venous Disease, Gout Photos Photo Uploaded By: Regan Lemming on 07/31/2016 12:40:54 Wound Measurements Length: (cm) 10 Width: (cm) 2 Depth: (cm) 0.1 Area: (cm) 15.708 Volume: (cm) 1.571 % Reduction in Area: % Reduction in Volume: Epithelialization: None Tunneling: No Undermining: No Wound  Description Full Thickness Without Exposed Classification: Support Structures Wound Margin: Distinct, outline attached Exudate Large Amount: Exudate Type: Serosanguineous Exudate Color: red, brown Maurice Delgado, Maurice Delgado ([id number]) Foul Odor After Cleansing: No Slough/Fibrino No Wound Bed Granulation Amount: Small (1-33%) Exposed Structure Granulation Quality: Pink Fascia Exposed: No Necrotic Amount: Large (67-100%) Fat Layer (Subcutaneous Tissue) Exposed: No Necrotic Quality: Eschar, Adherent Slough Tendon Exposed: No Muscle Exposed: No Joint Exposed: No Bone Exposed: No Limited to Skin Breakdown Periwound Skin Texture Texture Color No Abnormalities Noted: No No Abnormalities Noted: No Hemosiderin Staining: Yes Moisture No Abnormalities Noted: No Temperature / Pain Temperature: No  Abnormality Wound Preparation Ulcer Cleansing: Rinsed/Irrigated with Saline Topical Anesthetic Applied: Other: lidocaine 4%, Treatment Notes Wound #1 (Right, Anterior Lower Leg) 1. Cleansed with: Cleanse wound with antibacterial soap and water 2. Anesthetic Topical Lidocaine 4% cream to wound bed prior to debridement 3. Peri-wound Care: Moisturizing lotion 4. Dressing Applied: Santyl Ointment 5. Secondary Dressing Applied ABD Pad 7. Secured with 3 Layer Compression System - Right Lower Extremity Electronic Signature(s) Signed: 07/31/2016 4:42:38 PM By: Regan Lemming BSN, RN Entered By: Regan Lemming on 07/31/2016 09:44:43 Maurice Delgado ([id number]) -------------------------------------------------------------------------------- Vitals Details Patient Name: Maurice Delgado Date of Service: 07/31/2016 9:00 AM Medical Record Number: [id number] Patient Account Number: [id number] Date of Birth/Sex: 1933/12/23 (83 y.o. Male) Treating RN: Afful, RN, BSN, [place] Primary Care Matt Delpizzo: Gala Romney Other Clinician: Referring Tatiyanna Lashley: Loralie Champagne Treating Rushawn Capshaw/Extender: Tito Dine in Treatment: 0 Vital Signs Time Taken: 09:41 Temperature (F): 97.8 Height (in): 74 Pulse (bpm): 84 Source: Stated Respiratory Rate (breaths/min): 20 Weight (lbs): 296 Blood Pressure (mmHg): 130/90 Source: Measured Reference Range: 80 - 120 mg / dl Body Mass Index (BMI): 38 Electronic Signature(s) Signed: 07/31/2016 4:42:38 PM By: Regan Lemming BSN, RN Entered By: Regan Lemming on 07/31/2016 09:42:05

## 2016-08-01 NOTE — Progress Notes (Signed)
VASILIOS, BATZ ([id number]) Visit Report for 07/31/2016 Abuse/Suicide Risk Screen Details Patient Name: Maurice Delgado Date of Service: 07/31/2016 9:00 AM Medical Record Number: [id number] Patient Account Number: [id number] Date of Birth/Sex: 1933-07-18 (83 y.o. Male) Treating RN: Afful, RN, BSN, [place] [facility] Baylie Drakes: Gala Romney Other Clinician: Referring Shalamar Plourde: Loralie Champagne Treating Asahel Risden/Extender: Tito Dine in Treatment: 0 Abuse/Suicide Risk Screen Items Answer ABUSE/SUICIDE RISK SCREEN: Has anyone close to you tried to hurt or harm you recentlyo No Do you feel uncomfortable with anyone in your familyo No Has anyone forced you do things that you didnot want to doo No Do you have any thoughts of harming yourselfo No Patient displays signs or symptoms of abuse and/or neglect. No Electronic Signature(s) Signed: 07/31/2016 4:42:38 PM By: Regan Lemming BSN, RN Entered By: Regan Lemming on 07/31/2016 09:45:11 Maurice Delgado ([id number]) -------------------------------------------------------------------------------- Activities of Daily Living Details Patient Name: Maurice Delgado Date of Service: 07/31/2016 9:00 AM Medical Record Number: [id number] Patient Account Number: [id number] Date of Birth/Sex: June 14, 1934 (83 y.o. Male) Treating RN: Afful, RN, BSN, [facility] Zahari Xiang: Gala Romney Other Clinician: Referring Winnona Wargo: Loralie Champagne Treating Lawrence Roldan/Extender: Tito Dine in Treatment: 0 Activities of Daily Living Items Answer Activities of Daily Living (Please select one for each item) Drive Automobile Need Assistance Take Medications Completely Able Use Telephone Completely Able Care for Appearance Completely Able Use Toilet Completely Able Bath / Shower Completely Able Dress Self Completely Able Feed Self Completely Able Walk Completely Able Get In / Out Bed Completely Able Housework Completely Able Prepare  Meals Completely [place] for Self Completely Able Electronic Signature(s) Signed: 07/31/2016 4:42:38 PM By: Regan Lemming BSN, RN Entered By: Regan Lemming on 07/31/2016 09:45:42 Maurice Delgado ([id number]) -------------------------------------------------------------------------------- Education Assessment Details Patient Name: Maurice Delgado Date of Service: 07/31/2016 9:00 AM Medical Record Number: [id number] Patient Account Number: [id number] Date of Birth/Sex: [birth year] (82 y.o. Male) Treating RN: Afful, RN, BSN, [facility] Rosangela Fehrenbach: Gala Romney Other Clinician: Referring Zakara Parkey: Loralie Champagne Treating Cataleah Stites/Extender: Tito Dine in Treatment: 0 Primary Learner Assessed: Patient Learning Preferences/Education Level/Primary Language Learning Preference: Explanation Highest Education Level: College or Above Preferred Language: English Cognitive Barrier Assessment/Beliefs Language Barrier: No Physical Barrier Assessment Impaired Vision: Yes Glasses Impaired Hearing: Yes Hearing Aid Decreased Hand dexterity: No Knowledge/Comprehension Assessment Knowledge Level: High Comprehension Level: High Ability to understand written High instructions: Ability to understand verbal High instructions: Motivation Assessment Anxiety Level: Calm Cooperation: Cooperative Education Importance: Acknowledges Need Interest in Health Problems: Asks Questions Perception: Coherent Willingness to Engage in Self- High Management Activities: Readiness to Engage in Self- High Management Activities: Electronic Signature(s) Signed: 07/31/2016 4:42:38 PM By: Regan Lemming BSN, RN Entered By: Regan Lemming on 07/31/2016 09:46:10 Maurice Delgado ([id number]) -------------------------------------------------------------------------------- Fall Risk Assessment Details Patient Name: Maurice Delgado Date of Service: 07/31/2016 9:00  AM Medical Record Number: [id number] Patient Account Number: [id number] Date of Birth/Sex: Aug 19, 1933 (83 y.o. Male) Treating RN: Afful, RN, BSN, [place] [facility] Kanen Mottola: Gala Romney Other Clinician: Referring Alanda Colton: Loralie Champagne Treating Reshawn Ostlund/Extender: Tito Dine in Treatment: 0 Fall Risk Assessment Items Have you had 2 or more falls in the last 12 monthso 0 No Have you had any fall that resulted in injury in the last 12 monthso 0 No FALL RISK ASSESSMENT: History of falling - immediate or within 3 months 0 No Secondary diagnosis 0 No Ambulatory aid None/bed rest/wheelchair/nurse 0 No Crutches/cane/walker 15 Yes Furniture 0 No IV Access/Saline Lock 0 No  Gait/Training Normal/bed rest/immobile 0 No Weak 10 Yes Impaired 0 No Mental Status Oriented to own ability 0 Yes Electronic Signature(s) Signed: 07/31/2016 4:42:38 PM By: Regan Lemming BSN, RN Entered By: Regan Lemming on 07/31/2016 09:46:26 Maurice Delgado ([id number]) -------------------------------------------------------------------------------- Foot Assessment Details Patient Name: Maurice Delgado Date of Service: 07/31/2016 9:00 AM Medical Record Number: [id number] Patient Account Number: [id number] Date of Birth/Sex: January 15, 1934 (83 y.o. Male) Treating RN: Afful, RN, BSN, [facility] Boruch Manuele: Gala Romney Other Clinician: Referring Aedan Geimer: Loralie Champagne Treating Riyah Bardon/Extender: Tito Dine in Treatment: 0 Foot Assessment Items Site Locations + = Sensation present, - = Sensation absent, C = Callus, U = Ulcer R = Redness, W = Warmth, M = Maceration, PU = Pre-ulcerative lesion F = Fissure, S = Swelling, D = Dryness Assessment Right: Left: Other Deformity: No No Prior Foot Ulcer: No No Prior Amputation: No No Charcot Joint: No No Ambulatory Status: Ambulatory With Help Assistance Device: Walker Gait: Administrator, arts) Signed: 07/31/2016 4:42:38  PM By: Regan Lemming BSN, RN Entered By: Regan Lemming on 07/31/2016 09:46:56 Maurice Delgado ([id number]) -------------------------------------------------------------------------------- Nutrition Risk Assessment Details Patient Name: Maurice Delgado Date of Service: 07/31/2016 9:00 AM Medical Record Number: [id number] Patient Account Number: [id number] Date of Birth/Sex: 12/05/1933 (83 y.o. Male) Treating RN: Afful, RN, BSN, [place] [facility] Kyng Matlock: Gala Romney Other Clinician: Referring Arlet Marter: Loralie Champagne Treating Nalea Salce/Extender: Tito Dine in Treatment: 0 Height (in): 74 Weight (lbs): 296 Body Mass Index (BMI): 38 Nutrition Risk Assessment Items NUTRITION RISK SCREEN: I have an illness or condition that made me change the kind and/or 0 No amount of food I eat I eat fewer than two meals per day 0 No I eat few fruits and vegetables, or milk products 0 No I have three or more drinks of beer, liquor or wine almost every day 0 No I have tooth or mouth problems that make it hard for me to eat 0 No I don't always have enough money to buy the food I need 0 No I eat alone most of the time 0 No I take three or more different prescribed or over-the-counter drugs a 0 No day Without wanting to, I have lost or gained 10 pounds in the last six 0 No months I am not always physically able to shop, cook and/or feed myself 0 No Nutrition Protocols Good Risk Protocol 0 No interventions needed Moderate Risk Protocol Electronic Signature(s) Signed: 07/31/2016 4:42:38 PM By: Regan Lemming BSN, RN Entered By: Regan Lemming on 07/31/2016 09:46:32

## 2016-08-01 NOTE — Progress Notes (Signed)
CALOB, HODOR ([id number]) Visit Report for 07/31/2016 Chief Complaint Document Details Patient Name: Maurice Delgado, Maurice Delgado Date of Service: 07/31/2016 9:00 AM Medical Record Number: [id number] Patient Account Number: [id number] Date of Birth/Sex: 1933-10-16 (83 y.o. Male) Treating RN: Afful, RN, BSN, Velva Harman Primary Care Provider: Gala Romney Other Clinician: Referring Provider: Loralie Champagne Treating Provider/Extender: Tito Dine in Treatment: 0 Information Obtained from: Patient Chief Complaint 07/31/16; patient is here for review of wound on his right anterior leg this been there for 2 months Electronic Signature(s) Signed: 07/31/2016 5:08:44 PM By: Linton Ham MD Entered By: Linton Ham on 07/31/2016 10:54:51 Maurice Delgado ([id number]) -------------------------------------------------------------------------------- Debridement Details Patient Name: Maurice Delgado Date of Service: 07/31/2016 9:00 AM Medical Record Number: [id number] Patient Account Number: [id number] Date of Birth/Sex: 1934/01/29 (83 y.o. Male) Treating RN: Afful, RN, BSN, Hand Primary Care Provider: Gala Romney Other Clinician: Referring Provider: Loralie Champagne Treating Provider/Extender: Tito Dine in Treatment: 0 Debridement Performed for Wound #1 Right,Anterior Lower Leg Assessment: Performed By: Physician Ricard Dillon, MD Debridement: Debridement Pre-procedure Yes - 10:20 Verification/Time Out Taken: Start Time: 10:20 Pain Control: Lidocaine 4% Topical Solution Level: Skin/Subcutaneous Tissue Total Area Debrided (L x 10 (cm) x 2 (cm) = 20 (cm) W): Tissue and other Non-Viable, Eschar, Exudate, Fibrin/Slough, Subcutaneous material debrided: Instrument: Curette Bleeding: Minimum Hemostasis Achieved: Pressure End Time: 10:25 Procedural Pain: 0 Post Procedural Pain: 0 Response to Treatment: Procedure was tolerated well Post Debridement Measurements  of Total Wound Length: (cm) 10 Width: (cm) 2 Depth: (cm) 0.1 Volume: (cm) 1.571 Character of Wound/Ulcer Post Requires Further Debridement Debridement: Severity of Tissue Post Debridement: Fat layer exposed Post Procedure Diagnosis Same as Pre-procedure Electronic Signature(s) Signed: 07/31/2016 4:42:38 PM By: Regan Lemming BSN, RN Signed: 07/31/2016 5:08:44 PM By: Linton Ham MD Entered By: Linton Ham on 07/31/2016 10:54:26 [name], [name] ([id number]) [name], [name] ([id number]) -------------------------------------------------------------------------------- HPI Details Patient Name: Maurice Delgado Date of Service: 07/31/2016 9:00 AM Medical Record Number: [id number] Patient Account Number: [id number] Date of Birth/Sex: [birth year] (83 y.o. Male) Treating RN: Afful, RN, BSN, Velva Harman Primary Care Provider: Gala Romney Other Clinician: Referring Provider: Loralie Champagne Treating Provider/Extender: Tito Dine in Treatment: 0 History of Present Illness HPI Description: 07/31/16; this is an 83 year old man with multiple medical problems including chronic stage 3b congestive heart failure with an EF of 20-25%. He has a pacemaker in place. He also has COPD on chronic oxygen history of atrial fibrillation on chronic anticoagulation secondary to history of DVT with Coumadin. She was noted during his last visit to his cardiologist on 07/23/16 to have a wound on the right lower extremity which was weeping. He was referred here for review of this. Also noted at the time that he has nonpalpable pedal pulses and an arterial Doppler was ordered however I don't see the results of this yet. He tells me that he has had a wound on his leg for about 2 months. He is not doing anything specific to this but was recently given Santyl ointment which she is applying. It would appear that he has chronic venous insufficiency issues although he is not been using compression at  home. Electronic Signature(s) Signed: 07/31/2016 5:08:44 PM By: Linton Ham MD Entered By: Linton Ham on 07/31/2016 10:59:14 Maurice Delgado ([id number]) -------------------------------------------------------------------------------- Physical Exam Details Patient Name: Maurice Delgado Date of Service: 07/31/2016 9:00 AM Medical Record Number: [id number] Patient Account Number: [id number] Date of Birth/Sex: 11-06-1933 (83 y.o. Male) Treating RN: Afful, RN, BSN, [employer] Primary Care Provider: Gala Romney  Other Clinician: Referring Provider: Loralie Champagne Treating Provider/Extender: Ricard Dillon Weeks in Treatment: 0 Constitutional Sitting or standing Blood Pressure is within target range for patient.. Pulse regular and within target range for patient.Marland Kitchen Respirations regular, non-labored and within target range.. Temperature is normal and within the target range for the patient.. Patient has chronic illnesses but is awake and conversational. He is accompanied by his wife. Eyes Conjunctivae clear. No discharge.Marland Kitchen Respiratory Using oxygen at 2 L work of breathing appears normal.. Very shallow air entry.. Cardiovascular Irregular heart sounds no murmurs JVP is not elevated.. Femoral and popliteal pulses are palpable. Pedal pulses absent bilaterally.. Lymphatic None palpable in the popliteal or inguinal area. Neurological Reduced vibration and light touch. The patient is not a diabetic but he appears to have neuropathy symptoms. Psychiatric No evidence of depression, anxiety, or agitation. Calm, cooperative, and communicative. Appropriate interactions and affect.. Notes Wound exam; the patient has a linear wound roughly 10 cm long on the right anterior leg. This is mostly covered with thick necrotic tissue. This required debridement with a #3 curet after which the wound was really quite healthy-looking with healthy looking granulation. There is no evidence of  surrounding infection however the patient has evidence of chronic venous insufficiency with hemosiderin deposition and probably lymphedema. I see no evidence of an acute DVT Electronic Signature(s) Signed: 07/31/2016 5:08:44 PM By: Linton Ham MD Entered By: Linton Ham on 07/31/2016 11:01:56 Maurice Delgado ([id number]) -------------------------------------------------------------------------------- Physician Orders Details Patient Name: Maurice Delgado Date of Service: 07/31/2016 9:00 AM Medical Record Number: [id number] Patient Account Number: [id number] Date of Birth/Sex: July 17, 1933 (83 y.o. Male) Treating RN: Afful, RN, BSN, Velva Harman Primary Care Provider: Gala Romney Other Clinician: Referring Provider: Loralie Champagne Treating Provider/Extender: Tito Dine in Treatment: 0 Verbal / Phone Orders: No Diagnosis Coding Wound Cleansing Wound #1 Right,Anterior Lower Leg o Cleanse wound with mild soap and water o May shower with protection. o No tub bath. Anesthetic Wound #1 Right,Anterior Lower Leg o Topical Lidocaine 4% cream applied to wound bed prior to debridement Skin Barriers/Peri-Wound Care Wound #1 Right,Anterior Lower Leg o Moisturizing lotion o Triamcinolone Acetonide Ointment Primary Wound Dressing Wound #1 Right,Anterior Lower Leg o Santyl Ointment Secondary Dressing Wound #1 Right,Anterior Lower Leg o ABD pad Dressing Change Frequency o Change dressing every week Follow-up Appointments Wound #1 Right,Anterior Lower Leg o Return Appointment in 1 week. Edema Control Wound #1 Right,Anterior Lower Leg o 3 Layer Compression System - Right Lower Extremity o Elevate legs to the level of the heart and pump ankles as often as possible KAYL, MARTINEZ ([id number]) Additional Orders / Instructions Wound #1 Right,Anterior Lower Leg o Activity as tolerated Electronic Signature(s) Signed: 07/31/2016 4:42:38 PM By:  Regan Lemming BSN, RN Signed: 07/31/2016 5:08:44 PM By: Linton Ham MD Entered By: Regan Lemming on 07/31/2016 10:26:02 Maurice Delgado ([id number]) -------------------------------------------------------------------------------- Problem List Details Patient Name: Maurice Delgado Date of Service: 07/31/2016 9:00 AM Medical Record Number: [id number] Patient Account Number: [id number] Date of Birth/Sex: [birth year] (83 y.o. Male) Treating RN: Afful, RN, BSN, Velva Harman Primary Care Provider: Gala Romney Other Clinician: Referring Provider: Loralie Champagne Treating Provider/Extender: Tito Dine in Treatment: 0 Active Problems ICD-10 Encounter Code Description Active Date Diagnosis L97.211 Non-pressure chronic ulcer of right calf limited to 07/31/2016 Yes breakdown of skin I87.331 Chronic venous hypertension (idiopathic) with ulcer and 07/31/2016 Yes inflammation of right lower extremity I89.0 Lymphedema, not elsewhere classified 07/31/2016 Yes Inactive Problems Resolved Problems Electronic Signature(s) Signed: 07/31/2016 5:08:44 PM By: Linton Ham  MD Entered By: Linton Ham on 07/31/2016 10:50:42 Maurice Delgado ([id number]) -------------------------------------------------------------------------------- Progress Note Details Patient Name: Maurice Delgado Date of Service: 07/31/2016 9:00 AM Medical Record Number: [id number] Patient Account Number: [id number] Date of Birth/Sex: Jun 25, 1934 (83 y.o. Male) Treating RN: Afful, RN, BSN, Velva Harman Primary Care Provider: Gala Romney Other Clinician: Referring Provider: Loralie Champagne Treating Provider/Extender: Tito Dine in Treatment: 0 Subjective Chief Complaint Information obtained from Patient 07/31/16; patient is here for review of wound on his right anterior leg this been there for 2 months History of Present Illness (HPI) 07/31/16; this is an 83 year old man with multiple medical problems including  chronic stage 3b congestive heart failure with an EF of 20-25%. He has a pacemaker in place. He also has COPD on chronic oxygen history of atrial fibrillation on chronic anticoagulation secondary to history of DVT with Coumadin. She was noted during his last visit to his cardiologist on 07/23/16 to have a wound on the right lower extremity which was weeping. He was referred here for review of this. Also noted at the time that he has nonpalpable pedal pulses and an arterial Doppler was ordered however I don't see the results of this yet. He tells me that he has had a wound on his leg for about 2 months. He is not doing anything specific to this but was recently given Santyl ointment which she is applying. It would appear that he has chronic venous insufficiency issues although he is not been using compression at home. Wound History Patient presents with 1 open wound that has been present for approximately weeks. Laboratory tests have been performed in the last month. Patient reportedly has tested positive for an antibiotic resistant organism. Patient experiences the following problems associated with their wounds: swelling. Patient History Information obtained from Chart. Allergies No known allergies Family History Heart Disease - Mother, Hypertension - Mother, No family history of Cancer, Diabetes, Hereditary Spherocytosis, Kidney Disease, Lung Disease, Seizures, Stroke, Thyroid Problems, Tuberculosis. Social History Former smoker, Marital Status - Married, Alcohol Use - Never, Drug Use - Prior History, Caffeine Use - Rarely. Medical History JERRIUS, RICCIARDELLI ([id number]) Hematologic/Lymphatic Patient has history of Lymphedema Respiratory Patient has history of Chronic Obstructive Pulmonary Disease (COPD), Sleep Apnea Cardiovascular Patient has history of Arrhythmia, Congestive Heart Failure, Deep Vein Thrombosis, Hypertension, Peripheral Arterial Disease, Peripheral Venous  Disease Endocrine Denies history of Type I Diabetes, Type II Diabetes Integumentary (Skin) Denies history of History of Burn, History of pressure wounds Musculoskeletal Patient has history of Gout Oncologic Denies history of Received Chemotherapy, Received Radiation Psychiatric Denies history of Anorexia/bulimia, Confinement Anxiety Medical And Surgical History Notes Cardiovascular COmplete Heart block, PAcemaker, hyperlipedemia Gastrointestinal GERD Genitourinary CKD stage 3 Musculoskeletal SCIATICA Review of Systems (ROS) Constitutional Symptoms (General Health) The patient has no complaints or symptoms. Eyes The patient has no complaints or symptoms. Ear/Nose/Mouth/Throat The patient has no complaints or symptoms. Hematologic/Lymphatic Complains or has symptoms of Bleeding / Clotting Disorders. Respiratory Complains or has symptoms of Shortness of Breath. Cardiovascular Complains or has symptoms of LE edema. Gastrointestinal The patient has no complaints or symptoms. Endocrine The patient has no complaints or symptoms. Immunological The patient has no complaints or symptoms. Integumentary (Skin) Complains or has symptoms of Wounds, Bleeding or bruising tendency, Breakdown, Swelling. Musculoskeletal Complains or has symptoms of Muscle Weakness. MALAK, TREMEL ([id number]) Neurologic Complains or has symptoms of Focal/Weakness. Oncologic The patient has no complaints or symptoms. Psychiatric The patient has no complaints or symptoms. Objective Constitutional Sitting or standing Blood  Pressure is within target range for patient.. Pulse regular and within target range for patient.Marland Kitchen Respirations regular, non-labored and within target range.. Temperature is normal and within the target range for the patient.. Patient has chronic illnesses but is awake and conversational. He is accompanied by his wife. Vitals Time Taken: 9:41 AM, Height: 74 in, Source: Stated,  Weight: 296 lbs, Source: Measured, BMI: 38, Temperature: 97.8 F, Pulse: 84 bpm, Respiratory Rate: 20 breaths/min, Blood Pressure: 130/90 mmHg. Eyes Conjunctivae clear. No discharge.Marland Kitchen Respiratory Using oxygen at 2 L work of breathing appears normal.. Very shallow air entry.. Cardiovascular Irregular heart sounds no murmurs JVP is not elevated.. Femoral and popliteal pulses are palpable. Pedal pulses absent bilaterally.. Lymphatic None palpable in the popliteal or inguinal area. Neurological Reduced vibration and light touch. The patient is not a diabetic but he appears to have neuropathy symptoms. Psychiatric No evidence of depression, anxiety, or agitation. Calm, cooperative, and communicative. Appropriate interactions and affect.. General Notes: Wound exam; the patient has a linear wound roughly 10 cm long on the right anterior leg. This is mostly covered with thick necrotic tissue. This required debridement with a #3 curet after which the wound was really quite healthy-looking with healthy looking granulation. There is no evidence of surrounding infection however the patient has evidence of chronic venous insufficiency with hemosiderin MELLISH, Shaydon ([id number]) deposition and probably lymphedema. I see no evidence of an acute DVT Integumentary (Hair, Skin) Wound #1 status is Open. Original cause of wound was Gradually Appeared. The wound is located on the Right,Anterior Lower Leg. The wound measures 10cm length x 2cm width x 0.1cm depth; 15.708cm^2 area and 1.571cm^3 volume. The wound is limited to skin breakdown. There is no tunneling or undermining noted. There is a large amount of serosanguineous drainage noted. The wound margin is distinct with the outline attached to the wound base. There is small (1-33%) pink granulation within the wound bed. There is a large (67-100%) amount of necrotic tissue within the wound bed including Eschar and Adherent Slough. The periwound skin  appearance exhibited: Hemosiderin Staining. Periwound temperature was noted as No Abnormality. Assessment Active Problems ICD-10 L97.211 - Non-pressure chronic ulcer of right calf limited to breakdown of skin I87.331 - Chronic venous hypertension (idiopathic) with ulcer and inflammation of right lower extremity I89.0 - Lymphedema, not elsewhere classified Procedures Wound #1 Wound #1 is a Venous Leg Ulcer located on the Right,Anterior Lower Leg . There was a Skin/Subcutaneous Tissue Debridement [id number]) debridement with total area of 20 sq cm performed by Ricard Dillon, MD. with the following instrument(s): Curette to remove Non-Viable tissue/material including Exudate, Fibrin/Slough, Eschar, and Subcutaneous after achieving pain control using Lidocaine 4% Topical Solution. A time out was conducted at 10:20, prior to the start of the procedure. A Minimum amount of bleeding was controlled with Pressure. The procedure was tolerated well with a pain level of 0 throughout and a pain level of 0 following the procedure. Post Debridement Measurements: 10cm length x 2cm width x 0.1cm depth; 1.571cm^3 volume. Character of Wound/Ulcer Post Debridement requires further debridement. Severity of Tissue Post Debridement is: Fat layer exposed. Post procedure Diagnosis Wound #1: Same as Pre-Procedure MATHEW, BRUSTAD ([id number]) Plan Wound Cleansing: Wound #1 Right,Anterior Lower Leg: Cleanse wound with mild soap and water May shower with protection. No tub bath. Anesthetic: Wound #1 Right,Anterior Lower Leg: Topical Lidocaine 4% cream applied to wound bed prior to debridement Skin Barriers/Peri-Wound Care: Wound #1 Right,Anterior Lower Leg: Moisturizing lotion Triamcinolone Acetonide Ointment Primary Wound  Dressing: Wound #1 Right,Anterior Lower Leg: Santyl Ointment Secondary Dressing: Wound #1 Right,Anterior Lower Leg: ABD pad Dressing Change Frequency: Change dressing every  week Follow-up Appointments: Wound #1 Right,Anterior Lower Leg: Return Appointment in 1 week. Edema Control: Wound #1 Right,Anterior Lower Leg: 3 Layer Compression System - Right Lower Extremity Elevate legs to the level of the heart and pump ankles as often as possible Additional Orders / Instructions: Wound #1 Right,Anterior Lower Leg: Activity as tolerated #1 I think it is reasonable to continue the Santyl dry gauze and after some thought I put him in a Profore light wrap. The amount of edema here was so extensive I didn't think that anything less would suffice. Arterial studies have been ordered by his cardiologist but I don't see these results. #2 he has both chronic venous insufficiency which is very significant bilaterally and likely secondary lymphedema stage III. He is going to need compression after this wound heals and I talked about this in some detail #3 severe chronic systolic heart failure no evidence of biventricular failure based on exam. The edema in his legs is nonpitting and I think most of this is probably secondary lymphedema BRYOR, STRIANO ([id number]) Electronic Signature(s) Signed: 07/31/2016 5:08:44 PM By: Linton Ham MD Entered By: Linton Ham on 07/31/2016 11:03:22 Maurice Delgado ([id number]) -------------------------------------------------------------------------------- ROS/PFSH Details Patient Name: Maurice Delgado Date of Service: 07/31/2016 9:00 AM Medical Record Number: [id number] Patient Account Number: [id number] Date of Birth/Sex: July 10, 1933 (83 y.o. Male) Treating RN: Afful, RN, BSN, Velva Harman Primary Care Provider: Gala Romney Other Clinician: Referring Provider: Loralie Champagne Treating Provider/Extender: Tito Dine in Treatment: 0 Information Obtained From Chart Wound History Do you currently have one or more open woundso Yes How many open wounds do you currently haveo 1 Approximately how long have you had your  woundso weeks Have you had other problems associated with your woundso Swelling Hematologic/Lymphatic Complaints and Symptoms: Positive for: Bleeding / Clotting Disorders Medical History: Positive for: Lymphedema Respiratory Complaints and Symptoms: Positive for: Shortness of Breath Medical History: Positive for: Chronic Obstructive Pulmonary Disease (COPD); Sleep Apnea Cardiovascular Complaints and Symptoms: Positive for: LE edema Medical History: Positive for: Arrhythmia; Congestive Heart Failure; Deep Vein Thrombosis; Hypertension; Peripheral Arterial Disease; Peripheral Venous Disease Past Medical History Notes: COmplete Heart block, PAcemaker, hyperlipedemia Integumentary (Skin) Complaints and Symptoms: Positive for: Wounds; Bleeding or bruising tendency; Breakdown; Swelling Medical History: Negative for: History of Burn; History of pressure wounds VALJEAN, OR ([id number]) Musculoskeletal Complaints and Symptoms: Positive for: Muscle Weakness Medical History: Positive for: Gout Past Medical History Notes: SCIATICA Neurologic Complaints and Symptoms: Positive for: Focal/Weakness Constitutional Symptoms (General Health) Complaints and Symptoms: No Complaints or Symptoms Eyes Complaints and Symptoms: No Complaints or Symptoms Ear/Nose/Mouth/Throat Complaints and Symptoms: No Complaints or Symptoms Gastrointestinal Complaints and Symptoms: No Complaints or Symptoms Medical History: Past Medical History Notes: GERD Endocrine Complaints and Symptoms: No Complaints or Symptoms Medical History: Negative for: Type I Diabetes; Type II Diabetes Genitourinary Medical History: Past Medical History Notes: CKD stage 3 Frier, Bari ([id number]) Immunological Complaints and Symptoms: No Complaints or Symptoms Oncologic Complaints and Symptoms: No Complaints or Symptoms Medical History: Negative for: Received Chemotherapy; Received  Radiation Psychiatric Complaints and Symptoms: No Complaints or Symptoms Medical History: Negative for: Anorexia/bulimia; Confinement Anxiety Family and Social History Cancer: No; Diabetes: No; Heart Disease: Yes - Mother; Hereditary Spherocytosis: No; Hypertension: Yes - Mother; Kidney Disease: No; Lung Disease: No; Seizures: No; Stroke: No; Thyroid Problems: No; Tuberculosis: No; Former smoker; Marital Status - Married; Alcohol  Use: Never; Drug Use: Prior History; Caffeine Use: Rarely; Financial Concerns: No; Food, Clothing or Shelter Needs: No; Support System Lacking: No; Transportation Concerns: No; Living Will: No Electronic Signature(s) Signed: 07/31/2016 4:42:38 PM By: Regan Lemming BSN, RN Signed: 07/31/2016 5:08:44 PM By: Linton Ham MD Entered By: Regan Lemming on 07/31/2016 08:32:58 Maurice Delgado ([id number]) -------------------------------------------------------------------------------- SuperBill Details Patient Name: Maurice Delgado Date of Service: 07/31/2016 Medical Record Number: [id number] Patient Account Number: [id number] Date of Birth/Sex: 06-Jan-1934 (84 y.o. Male) Treating RN: Afful, RN, BSN, [place] Primary Care Provider: Gala Romney Other Clinician: Referring Provider: Loralie Champagne Treating Provider/Extender: Tito Dine in Treatment: 0 Diagnosis Coding ICD-10 Codes Code Description [id number] Non-pressure chronic ulcer of right calf limited to breakdown of skin Chronic venous hypertension (idiopathic) with ulcer and inflammation of right lower I87.331 extremity I89.0 Lymphedema, not elsewhere classified Facility Procedures CPT4 Code Description: [id number] - WOUND CARE VISIT-LEV 3 EST PT Modifier: Quantity: 1 CPT4 Code Description: [id number] - DEB SUBQ TISSUE 20 SQ CM/< ICD-10 Description Diagnosis L97.211 Non-pressure chronic ulcer of right calf limited to Modifier: breakdown of Quantity: 1 skin Physician Procedures CPT4:  Description Modifier Quantity Code [id number] - WC PHYS LEVEL 4 - NEW PT 1 ICD-10 Description Diagnosis L97.211 Non-pressure chronic ulcer of right calf limited to breakdown of skin I87.331 Chronic venous hypertension (idiopathic) with ulcer and  inflammation of right lower extremity CPT4: [id number] - WC PHYS SUBQ TISS 20 SQ CM 1 ICD-10 Description Diagnosis L97.211 Non-pressure chronic ulcer of right calf limited to breakdown of skin Electronic Signature(s) Signed: 07/31/2016 5:08:44 PM By: Linton Ham MD [place], Charleen Kirks ([id number]) Entered By: Linton Ham on 07/31/2016 11:03:53

## 2016-08-07 ENCOUNTER — Ambulatory Visit (INDEPENDENT_AMBULATORY_CARE_PROVIDER_SITE_OTHER): Payer: Medicare Other | Admitting: *Deleted

## 2016-08-07 ENCOUNTER — Other Ambulatory Visit: Payer: Self-pay | Admitting: Internal Medicine

## 2016-08-07 ENCOUNTER — Encounter: Payer: Medicare Other | Attending: Internal Medicine | Admitting: Internal Medicine

## 2016-08-07 DIAGNOSIS — Z950 Presence of cardiac pacemaker: Secondary | ICD-10-CM | POA: Diagnosis not present

## 2016-08-07 DIAGNOSIS — I890 Lymphedema, not elsewhere classified: Secondary | ICD-10-CM | POA: Insufficient documentation

## 2016-08-07 DIAGNOSIS — S81802A Unspecified open wound, left lower leg, initial encounter: Secondary | ICD-10-CM | POA: Diagnosis not present

## 2016-08-07 DIAGNOSIS — J449 Chronic obstructive pulmonary disease, unspecified: Secondary | ICD-10-CM | POA: Insufficient documentation

## 2016-08-07 DIAGNOSIS — S81801A Unspecified open wound, right lower leg, initial encounter: Secondary | ICD-10-CM | POA: Diagnosis not present

## 2016-08-07 DIAGNOSIS — Z86718 Personal history of other venous thrombosis and embolism: Secondary | ICD-10-CM | POA: Diagnosis not present

## 2016-08-07 DIAGNOSIS — I87331 Chronic venous hypertension (idiopathic) with ulcer and inflammation of right lower extremity: Secondary | ICD-10-CM | POA: Diagnosis not present

## 2016-08-07 DIAGNOSIS — Z5181 Encounter for therapeutic drug level monitoring: Secondary | ICD-10-CM

## 2016-08-07 DIAGNOSIS — I480 Paroxysmal atrial fibrillation: Secondary | ICD-10-CM

## 2016-08-07 DIAGNOSIS — Z7901 Long term (current) use of anticoagulants: Secondary | ICD-10-CM | POA: Diagnosis not present

## 2016-08-07 DIAGNOSIS — L97211 Non-pressure chronic ulcer of right calf limited to breakdown of skin: Secondary | ICD-10-CM | POA: Diagnosis not present

## 2016-08-07 DIAGNOSIS — I4891 Unspecified atrial fibrillation: Secondary | ICD-10-CM | POA: Insufficient documentation

## 2016-08-07 LAB — POCT INR: INR: 3.1

## 2016-08-08 ENCOUNTER — Other Ambulatory Visit: Payer: Self-pay | Admitting: Internal Medicine

## 2016-08-08 DIAGNOSIS — L98499 Non-pressure chronic ulcer of skin of other sites with unspecified severity: Secondary | ICD-10-CM

## 2016-08-08 NOTE — Progress Notes (Addendum)
[place], SCAFF ([id number]) Visit Report for 08/07/2016 Arrival Information Details Patient Name: Maurice Delgado, Maurice Delgado Date of Service: 08/07/2016 1:45 PM Medical Record Number: [id number] Patient Account Number: [id number] Date of Birth/Sex: 10/28/1933 (83 y.o. Male) Treating RN: Baruch Gouty, RN, BSN, Velva Harman Primary Care Christiona Siddique: Gala Romney Other Clinician: Referring Breckan Cafiero: Gala Romney Treating Savaya Hakes/Extender: Tito Dine in Treatment: 1 Visit Information History Since Last Visit All ordered tests and consults were completed: No Patient Arrived: Gilford Rile Added or deleted any medications: No Arrival Time: 13:52 Any new allergies or adverse reactions: No Accompanied By: wife Had a fall or experienced change in No Transfer Assistance: None activities of daily living that may affect Patient Identification Yes risk of falls: Verified: Signs or symptoms of abuse/neglect since last No Secondary Verification Yes visito Process Completed: Hospitalized since last visit: No Patient Has Alerts: Yes Has Dressing in Place as Prescribed: Yes Patient Alerts: Patient on Blood Thinner Has Compression in Place as Prescribed: Yes COUMADIN,PACEMAKER Pain Present Now: No Electronic Signature(s) Signed: 08/07/2016 3:11:21 PM By: Regan Lemming BSN, RN Entered By: Regan Lemming on 08/07/2016 13:52:35 Maurice Delgado ([id number]) -------------------------------------------------------------------------------- Compression Therapy Details Patient Name: Maurice Delgado Date of Service: 08/07/2016 1:45 PM Medical Record Number: [id number] Patient Account Number: [id number] Date of Birth/Sex: [birth year] (83 y.o. Male) Treating RN: Cornell Barman Primary Care Charm Stenner: Gala Romney Other Clinician: Referring Raylynn Hersh: Gala Romney Treating Danee Soller/Extender: Ricard Dillon Weeks in Treatment: 1 Compression Therapy Performed for Wound Wound #1 Right,Anterior Lower  Leg Assessment: Performed By: Clinician Cornell Barman, RN Compression Type: Three Layer Post Procedure Diagnosis Same as Pre-procedure Electronic Signature(s) Signed: 08/13/2016 4:41:51 PM By: Gretta Cool, RN, BSN, Kim RN, BSN Entered By: Gretta Cool, RN, BSN, Kim on 08/13/2016 09:22:16 Maurice Delgado ([id number]) -------------------------------------------------------------------------------- Compression Therapy Details Patient Name: Maurice Delgado Date of Service: 08/07/2016 1:45 PM Medical Record Number: [id number] Patient Account Number: [id number] Date of Birth/Sex: [birth year] (83 y.o. Male) Treating RN: Cornell Barman Primary Care Mylasia Vorhees: Gala Romney Other Clinician: Referring Cailan General: Gala Romney Treating Sihaam Chrobak/Extender: Tito Dine in Treatment: 1 Compression Therapy Performed for Wound Wound #2 Left,Anterior Lower Leg Assessment: Performed By: Clinician Cornell Barman, RN Compression Type: Three Layer Post Procedure Diagnosis Same as Pre-procedure Electronic Signature(s) Signed: 08/13/2016 4:41:51 PM By: Gretta Cool, RN, BSN, Kim RN, BSN Entered By: Gretta Cool, RN, BSN, Kim on 08/13/2016 09:22:16 Maurice Delgado ([id number]) -------------------------------------------------------------------------------- Encounter Discharge Information Details Patient Name: Maurice Delgado Date of Service: 08/07/2016 1:45 PM Medical Record Number: [id number] Patient Account Number: [id number] Date of Birth/Sex: 23-Jan-1934 (83 y.o. Male) Treating RN: Baruch Gouty, RN, BSN, [place] Primary Care Rosa Wyly: Gala Romney Other Clinician: Referring Dymphna Wadley: Gala Romney Treating Raoul Ciano/Extender: Tito Dine in Treatment: 1 Encounter Discharge Information Items Discharge Pain Level: 0 Discharge Condition: Stable Ambulatory Status: Walker Discharge Destination: Home Transportation: Private Auto Accompanied By: wife Schedule Follow-up Appointment: No Medication Reconciliation  completed No and provided to Patient/Care Tyrelle Raczka: Provided on Clinical Summary of Care: 08/07/2016 Form Type Recipient Paper Patient AI Electronic Signature(s) Signed: 08/07/2016 2:51:43 PM By: Ruthine Dose Entered By: Ruthine Dose on 08/07/2016 14:51:43 Maurice Delgado ([id number]) -------------------------------------------------------------------------------- Lower Extremity Assessment Details Patient Name: Maurice Delgado Date of Service: 08/07/2016 1:45 PM Medical Record Number: [id number] Patient Account Number: [id number] Date of Birth/Sex: 03/28/1934 (83 y.o. Male) Treating RN: Baruch Gouty, RN, BSN, [place] Primary Care Eddy Termine: Gala Romney Other Clinician: Referring Margie Urbanowicz: Gala Romney Treating Konnie Noffsinger/Extender: Ricard Dillon Weeks in Treatment: 1 Edema Assessment Assessed: [Left: No] [Right: No] E[Left: dema] [Right: :] Calf Left: Right: Point of Measurement:  41 cm From Medial Instep 55 cm 47 cm Ankle Left: Right: Point of Measurement: 10 cm From Medial Instep 34 cm 28 cm Vascular Assessment Claudication: Claudication Assessment [Left:None] [Right:None] Pulses: Dorsalis Pedis Palpable: [Left:No] [Right:No] Posterior Tibial Extremity colors, hair growth, and conditions: Extremity Color: [Left:Hyperpigmented] [Right:Hyperpigmented] Hair Growth on Extremity: [Left:No] [Right:No] Temperature of Extremity: [Left:Warm] [Right:Warm] Capillary Refill: [Left:< 3 seconds] [Right:< 3 seconds] Toe Nail Assessment Left: Right: Thick: Yes Yes Discolored: Yes Yes Deformed: No No Improper Length and Hygiene: Yes Yes Notes heel to knee 50cm Electronic Signature(s) MAKHI, FRIEDRICHSEN ([id number]) Signed: 08/07/2016 3:11:21 PM By: Regan Lemming BSN, RN Entered By: Regan Lemming on 08/07/2016 14:22:35 Maurice Delgado ([id number]) -------------------------------------------------------------------------------- Multi Wound Chart Details Patient Name: Maurice Delgado Date of Service: 08/07/2016 1:45 PM Medical Record Number: [id number] Patient Account Number: [id number] Date of Birth/Sex: 11-30-1933 (83 y.o. Male) Treating RN: Baruch Gouty, RN, BSN, [place] Primary Care Nyemah Watton: Gala Romney Other Clinician: Referring Kavion Mancinas: Gala Romney Treating Srah Ake/Extender: Ricard Dillon Weeks in Treatment: 1 Vital Signs Height(in): 74 Pulse(bpm): 90 Weight(lbs): 296 Blood Pressure 116/64 (mmHg): Body Mass Index(BMI): 38 Temperature(F): 97.5 Respiratory Rate 20 (breaths/min): Photos: [1:No Photos] [2:No Photos] [N/A:N/A] Wound Location: [1:Right Lower Leg - Anterior Left Lower Leg - Anterior] [N/A:N/A] Wounding Event: [1:Gradually Appeared] [2:Gradually Appeared] [N/A:N/A] Primary Etiology: [1:Venous Leg Ulcer] [2:Lymphedema] [N/A:N/A] Comorbid History: [1:Lymphedema, Chronic Obstructive Pulmonary Disease (COPD), Sleep Disease (COPD), Sleep Apnea, Arrhythmia, Congestive Heart Failure, Congestive Heart Failure, Deep Vein Thrombosis, Deep Vein Thrombosis, Hypertension, Peripheral  Hypertension, Peripheral Arterial Disease, Peripheral Venous Disease, Gout] [2:Lymphedema, Chronic Obstructive Pulmonary Apnea, Arrhythmia, Arterial Disease, Peripheral Venous Disease, Gout] [N/A:N/A] Date Acquired: [1:07/01/2016] [2:08/07/2016] [N/A:N/A] Weeks of Treatment: [1:1] [2:0] [N/A:N/A] Wound Status: [1:Open] [2:Open] [N/A:N/A] Measurements L x W x D 13.3x2x0.1 [2:1x1x0.1] [N/A:N/A] (cm) Area (cm) : [1:20.892] [2:0.785] [N/A:N/A] Volume (cm) : [1:2.089] [2:0.079] [N/A:N/A] % Reduction in Area: [1:-33.00%] [2:N/A] [N/A:N/A] % Reduction in Volume: -33.00% [2:N/A] [N/A:N/A] Classification: [1:Full Thickness Without Exposed Support Structures] [2:Partial Thickness] [N/A:N/A] Exudate Amount: [1:Large] [2:Small] [N/A:N/A] Exudate Type: [1:Serosanguineous] [2:Serous] [N/A:N/A] Exudate Color: [1:red, brown] [2:amber] [N/A:N/A] Wound Margin: [1:Distinct,  outline attached Flat and Intact] [N/A:N/A] Granulation Amount: Medium (34-66%) None Present (0%) N/A Granulation Quality: Pink N/A N/A Necrotic Amount: Medium (34-66%) Large (67-100%) N/A Necrotic Tissue: Adherent Slough Eschar N/A Exposed Structures: Fascia: No Fascia: No N/A Fat Layer (Subcutaneous Fat Layer (Subcutaneous Tissue) Exposed: No Tissue) Exposed: No Tendon: No Tendon: No Muscle: No Muscle: No Joint: No Joint: No Bone: No Bone: No Limited to Skin Limited to Skin Breakdown Breakdown Epithelialization: Small (1-33%) None N/A Periwound Skin Texture: No Abnormalities Noted No Abnormalities Noted N/A Periwound Skin No Abnormalities Noted No Abnormalities Noted N/A Moisture: Periwound Skin Color: Hemosiderin Staining: Yes Hemosiderin Staining: Yes N/A Mottled: Yes Temperature: No Abnormality No Abnormality N/A Tenderness on No No N/A Palpation: Wound Preparation: Ulcer Cleansing: Ulcer Cleansing: Other: N/A Rinsed/Irrigated with surg scrubs and water Saline, Other: surge scrub and water Topical Anesthetic Applied: None Topical Anesthetic Applied: Other: lidocaine 4% Treatment Notes Electronic Signature(s) Signed: 08/07/2016 4:28:51 PM By: Linton Ham MD Previous Signature: 08/07/2016 2:09:52 PM Version By: Regan Lemming BSN, RN Entered By: Linton Ham on 08/07/2016 14:38:48 Maurice Delgado ([id number]) -------------------------------------------------------------------------------- Multi-Disciplinary Care Plan Details Patient Name: Maurice Delgado Date of Service: 08/07/2016 1:45 PM Medical Record Number: [id number] Patient Account Number: [id number] Date of Birth/Sex: 1933/07/09 (83 y.o. Male) Treating RN: Baruch Gouty, RN, BSN, [place] Primary Care Neil Brickell: Gala Romney Other Clinician: Referring Zakkary Thibault: Gala Romney Treating Caraline Deutschman/Extender: Ricard Dillon  Weeks in Treatment: 1 Active Inactive ` Abuse / Safety / Falls / Self Care  Management Nursing Diagnoses: Impaired home maintenance Impaired physical mobility Knowledge deficit related to: safety; personal, health (wound), emergency Potential for falls Self care deficit: actual or potential Goals: Patient will remain injury free Date Initiated: 07/31/2016 Target Resolution Date: 10/24/2016 Goal Status: Active Patient/caregiver will verbalize understanding of skin care regimen Date Initiated: 07/31/2016 Target Resolution Date: 10/24/2016 Goal Status: Active Patient/caregiver will verbalize/demonstrate measures taken to improve the patient's personal safety Date Initiated: 07/31/2016 Target Resolution Date: 10/24/2016 Goal Status: Active Patient/caregiver will verbalize/demonstrate measures taken to prevent injury and/or falls Date Initiated: 07/31/2016 Target Resolution Date: 10/24/2016 Goal Status: Active Interventions: Assess fall risk on admission and as needed Assess: immobility, friction, shearing, incontinence upon admission and as needed Assess impairment of mobility on admission and as needed per policy Assess self care needs on admission and as needed Provide education on basic hygiene Provide education on personal and home safety Provide education on safe transfers Treatment Activities: Education provided on Basic Hygiene : 07/31/2016 VIRAAT, SOUDER ([id number]) Notes: ` Orientation to the Wound Care Program Nursing Diagnoses: Knowledge deficit related to the wound healing center program Goals: Patient/caregiver will verbalize understanding of the [place] Program Date Initiated: 07/31/2016 Target Resolution Date: 10/24/2016 Goal Status: Active Interventions: Provide education on orientation to the wound center Notes: ` Venous Leg Ulcer Nursing Diagnoses: Knowledge deficit related to disease process and management Potential for venous Insuffiency (use before diagnosis confirmed) Goals: Non-invasive venous studies are  completed as ordered Date Initiated: 07/31/2016 Target Resolution Date: 10/24/2016 Goal Status: Active Patient will maintain optimal edema control Date Initiated: 07/31/2016 Target Resolution Date: 10/24/2016 Goal Status: Active Patient/caregiver will verbalize understanding of disease process and disease management Date Initiated: 07/31/2016 Target Resolution Date: 10/24/2016 Goal Status: Active Verify adequate tissue perfusion prior to therapeutic compression application Date Initiated: 07/31/2016 Target Resolution Date: 10/24/2016 Goal Status: Active Interventions: Assess peripheral edema status every visit. Compression as ordered Provide education on venous insufficiency Treatment Activities: Therapeutic compression applied : 07/31/2016 DUREL, TIMIAN ([id number]) Notes: ` Wound/Skin Impairment Nursing Diagnoses: Impaired tissue integrity Knowledge deficit related to ulceration/compromised skin integrity Goals: Patient/caregiver will verbalize understanding of skin care regimen Date Initiated: 07/31/2016 Target Resolution Date: 10/24/2016 Goal Status: Active Ulcer/skin breakdown will have a volume reduction of 30% by week 4 Date Initiated: 07/31/2016 Target Resolution Date: 10/24/2016 Goal Status: Active Ulcer/skin breakdown will have a volume reduction of 50% by week 8 Date Initiated: 07/31/2016 Target Resolution Date: 10/24/2016 Goal Status: Active Ulcer/skin breakdown will have a volume reduction of 80% by week 12 Date Initiated: 07/31/2016 Target Resolution Date: 10/24/2016 Goal Status: Active Ulcer/skin breakdown will heal within 14 weeks Date Initiated: 07/31/2016 Target Resolution Date: 10/24/2016 Goal Status: Active Interventions: Assess patient/caregiver ability to obtain necessary supplies Assess patient/caregiver ability to perform ulcer/skin care regimen upon admission and as needed Assess ulceration(s) every visit Provide education on ulcer and skin  care Treatment Activities: Patient referred to home care : 07/31/2016 Skin care regimen initiated : 07/31/2016 Topical wound management initiated : 07/31/2016 Notes: Electronic Signature(s) Signed: 08/07/2016 3:11:21 PM By: Regan Lemming BSN, RN Previous Signature: 08/07/2016 2:09:44 PM Version By: Regan Lemming BSN, RN Entered By: Regan Lemming on 08/07/2016 14:14:57 SHAKEEL, DRZEWIECKI ([id number]) Maurice Delgado ([id number]) -------------------------------------------------------------------------------- Pain Assessment Details Patient Name: Maurice Delgado Date of Service: 08/07/2016 1:45 PM Medical Record Number: [id number] Patient Account Number: [id number] Date of Birth/Sex: 1933-12-01 (83 y.o. Male) Treating RN: Baruch Gouty, RN, BSN, Velva Harman  Primary Care Sama Arauz: Gala Romney Other Clinician: Referring Jennette Leask: Gala Romney Treating Oval Moralez/Extender: Tito Dine in Treatment: 1 Active Problems Location of Pain Severity and Description of Pain Patient Has Paino No Site Locations With Dressing Change: No Pain Management and Medication Current Pain Management: Electronic Signature(s) Signed: 08/07/2016 3:11:21 PM By: Regan Lemming BSN, RN Entered By: Regan Lemming on 08/07/2016 13:52:41 Maurice Delgado ([id number]) -------------------------------------------------------------------------------- Patient/Caregiver Education Details Patient Name: Maurice Delgado Date of Service: 08/07/2016 1:45 PM Medical Record Number: [id number] Patient Account Number: [id number] Date of Birth/Gender: 1933-07-23 (83 y.o. Male) Treating RN: Baruch Gouty, RN, BSN, [place] Primary Care Physician: Gala Romney Other Clinician: Referring Physician: Gala Romney Treating Physician/Extender: Tito Dine in Treatment: 1 Education Assessment Education Provided To: Patient Education Topics Provided Basic Hygiene: Methods: Explain/Verbal Responses: State content  correctly Safety: Methods: Explain/Verbal Responses: State content correctly Venous: Methods: Explain/Verbal Responses: State content correctly Welcome [place]: Methods: Explain/Verbal Responses: State content correctly Wound/Skin Impairment: Methods: Explain/Verbal Responses: State content correctly Electronic Signature(s) Signed: 08/07/2016 3:11:21 PM By: Regan Lemming BSN, RN Entered By: Regan Lemming on 08/07/2016 14:51:46 Maurice Delgado ([id number]) -------------------------------------------------------------------------------- Wound Assessment Details Patient Name: Maurice Delgado Date of Service: 08/07/2016 1:45 PM Medical Record Number: [id number] Patient Account Number: [id number] Date of Birth/Sex: 1934-06-05 (82 y.o. Male) Treating RN: Baruch Gouty, RN, BSN, [place] Primary Care Anjana Cheek: Gala Romney Other Clinician: Referring Delene Morais: Gala Romney Treating Sanad Fearnow/Extender: Ricard Dillon Weeks in Treatment: 1 Wound Status Wound Number: 1 Primary Venous Leg Ulcer Etiology: Wound Location: Right Lower Leg - Anterior Wound Open Wounding Event: Gradually Appeared Status: Date Acquired: 07/01/2016 Comorbid Lymphedema, Chronic Obstructive Weeks Of Treatment: 1 History: Pulmonary Disease (COPD), Sleep Clustered Wound: No Apnea, Arrhythmia, Congestive Heart Failure, Deep Vein Thrombosis, Hypertension, Peripheral Arterial Disease, Peripheral Venous Disease, Gout Photos Photo Uploaded By: Regan Lemming on 08/07/2016 15:07:00 Wound Measurements Length: (cm) 13.3 Width: (cm) 2 Depth: (cm) 0.1 Area: (cm) 20.892 Volume: (cm) 2.089 % Reduction in Area: -33% % Reduction in Volume: -33% Epithelialization: Small (1-33%) Tunneling: No Undermining: No Wound Description Full Thickness Without Exposed Foul Odor Aft Classification: Support Structures Slough/Fibrin Wound Margin: Distinct, outline attached Exudate Large Amount: Exudate Type:  Serosanguineous Exudate Color: red, brown DRAY, LEREW ([id number]) er Cleansing: No o No Wound Bed Granulation Amount: Medium (34-66%) Exposed Structure Granulation Quality: Pink Fascia Exposed: No Necrotic Amount: Medium (34-66%) Fat Layer (Subcutaneous Tissue) Exposed: No Necrotic Quality: Adherent Slough Tendon Exposed: No Muscle Exposed: No Joint Exposed: No Bone Exposed: No Limited to Skin Breakdown Periwound Skin Texture Texture Color No Abnormalities Noted: No No Abnormalities Noted: No Hemosiderin Staining: Yes Moisture No Abnormalities Noted: No Temperature / Pain Temperature: No Abnormality Wound Preparation Ulcer Cleansing: Rinsed/Irrigated with Saline, Other: surge scrub and water, Topical Anesthetic Applied: Other: lidocaine 4%, Treatment Notes Wound #1 (Right, Anterior Lower Leg) 3. Peri-wound Care: Barrier cream Moisturizing lotion Other peri-wound care (specify in notes) 4. Dressing Applied: Aquacel Ag 5. Secondary Dressing Applied ABD Pad 7. Secured with 3 Layer Compression System - Bilateral Notes TCA Electronic Signature(s) Signed: 08/07/2016 3:11:21 PM By: Regan Lemming BSN, RN Entered By: Regan Lemming on 08/07/2016 14:06:58 Maurice Delgado ([id number]) -------------------------------------------------------------------------------- Wound Assessment Details Patient Name: Maurice Delgado Date of Service: 08/07/2016 1:45 PM Medical Record Number: [id number] Patient Account Number: [id number] Date of Birth/Sex: 07-May-1934 (83 y.o. Male) Treating RN: Baruch Gouty, RN, BSN, [place] Primary Care Adley Castello: Gala Romney Other Clinician: Referring Annissa Andreoni: Gala Romney Treating Arasely Akkerman/Extender: Ricard Dillon Weeks in Treatment: 1 Wound Status  Wound Number: 2 Primary Lymphedema Etiology: Wound Location: Left Lower Leg - Anterior Wound Open Wounding Event: Gradually Appeared Status: Date Acquired: 08/07/2016 Comorbid Lymphedema, Chronic  Obstructive Weeks Of Treatment: 0 History: Pulmonary Disease (COPD), Sleep Clustered Wound: No Apnea, Arrhythmia, Congestive Heart Failure, Deep Vein Thrombosis, Hypertension, Peripheral Arterial Disease, Peripheral Venous Disease, Gout Photos Photo Uploaded By: Regan Lemming on 08/07/2016 15:07:01 Wound Measurements Length: (cm) 1 Width: (cm) 1 Depth: (cm) 0.1 Area: (cm) 0.785 Volume: (cm) 0.079 % Reduction in Area: % Reduction in Volume: Epithelialization: None Tunneling: No Undermining: No Wound Description Classification: Partial Thickness Wound Margin: Flat and Intact Exudate Amount: Small Exudate Type: Serous Exudate Color: amber Foul Odor After Cleansing: No Slough/Fibrino No Wound Bed MALAKI, TABACCO ([id number]) Granulation Amount: None Present (0%) Exposed Structure Necrotic Amount: Large (67-100%) Fascia Exposed: No Necrotic Quality: Eschar Fat Layer (Subcutaneous Tissue) Exposed: No Tendon Exposed: No Muscle Exposed: No Joint Exposed: No Bone Exposed: No Limited to Skin Breakdown Periwound Skin Texture Texture Color No Abnormalities Noted: No No Abnormalities Noted: No Hemosiderin Staining: Yes Moisture Mottled: Yes No Abnormalities Noted: No Temperature / Pain Temperature: No Abnormality Wound Preparation Ulcer Cleansing: Other: surg scrubs and water, Topical Anesthetic Applied: None Treatment Notes Wound #2 (Left, Anterior Lower Leg) 3. Peri-wound Care: Barrier cream Moisturizing lotion Other peri-wound care (specify in notes) 4. Dressing Applied: Aquacel Ag 5. Secondary Dressing Applied ABD Pad 7. Secured with 3 Layer Compression System - Bilateral Notes TCA Electronic Signature(s) Signed: 08/07/2016 3:11:21 PM By: Regan Lemming BSN, RN Entered By: Regan Lemming on 08/07/2016 14:24:42 Maurice Delgado ([id number]) -------------------------------------------------------------------------------- Vitals Details Patient Name:  Maurice Delgado Date of Service: 08/07/2016 1:45 PM Medical Record Number: [id number] Patient Account Number: [id number] Date of Birth/Sex: 1933-07-17 (83 y.o. Male) Treating RN: Afful, RN, BSN, [place] Primary Care Vaishnav Demartin: Gala Romney Other Clinician: Referring Nadalie Laughner: Gala Romney Treating Erika Hussar/Extender: Ricard Dillon Weeks in Treatment: 1 Vital Signs Time Taken: 13:52 Temperature (F): 97.5 Height (in): 74 Pulse (bpm): 90 Weight (lbs): 296 Respiratory Rate (breaths/min): 20 Body Mass Index (BMI): 38 Blood Pressure (mmHg): 116/64 Reference Range: 80 - 120 mg / dl Electronic Signature(s) Signed: 08/07/2016 3:11:21 PM By: Regan Lemming BSN, RN Entered By: Regan Lemming on 08/07/2016 13:57:03

## 2016-08-08 NOTE — Progress Notes (Addendum)
REASON, BLAN ([id number]) Visit Report for 08/07/2016 Chief Complaint Document Details Patient Name: Maurice Delgado, Maurice Delgado Date of Service: 08/07/2016 1:45 PM Medical Record Number: [id number] Patient Account Number: [id number] Date of Birth/Sex: July 04, 1933 (83 y.o. Male) Treating RN: Baruch Gouty, RN, BSN, [place] Primary Care Provider: Gala Romney Other Clinician: Referring Provider: Gala Romney Treating Provider/Extender: Ricard Dillon Weeks in Treatment: 1 Information Obtained from: Patient Chief Complaint 07/31/16; patient is here for review of wound on his right anterior leg this been there for 2 months Electronic Signature(s) Signed: 08/07/2016 4:28:51 PM By: Linton Ham MD Entered By: Linton Ham on 08/07/2016 14:39:04 Maurice Delgado ([id number]) -------------------------------------------------------------------------------- HPI Details Patient Name: Maurice Delgado Date of Service: 08/07/2016 1:45 PM Medical Record Number: [id number] Patient Account Number: [id number] Date of Birth/Sex: 1933/09/25 (83 y.o. Male) Treating RN: Baruch Gouty, RN, BSN, [place] Primary Care Provider: Gala Romney Other Clinician: Referring Provider: Gala Romney Treating Provider/Extender: Ricard Dillon Weeks in Treatment: 1 History of Present Illness HPI Description: 07/31/16; this is an 83 year old man with multiple medical problems including chronic stage 3b congestive heart failure with an EF of 20-25%. He has a pacemaker in place. He also has COPD on chronic oxygen history of atrial fibrillation on chronic anticoagulation secondary to history of DVT with Coumadin. She was noted during his last visit to his cardiologist on 07/23/16 to have a wound on the right lower extremity which was weeping. He was referred here for review of this. Also noted at the time that he has nonpalpable pedal pulses and an arterial Doppler was ordered however I don't see the results of this yet. He tells me  that he has had a wound on his leg for about 2 months. He is not doing anything specific to this but was recently given Santyl ointment which she is applying. It would appear that he has chronic venous insufficiency issues although he is not been using compression at home. 08/17/16; this is a frail man with stage IIIB congestive heart failure with an ejection fraction of 20-25%. He has COPD on chronic oxygen. He came here last week with an open wound on his right anterior leg was weeping edema. Based on assessment I felt he had chronic venous insufficiency with secondary lymphedema. He had already had arterial Dopplers ordered by his consultant cardiologist before he came here although he is not had test done. He had a 3 layer wrap on the right leg which she appears to have tolerated quite well. In the meantime the unwrapped left leg is much bigger with a surface eschar anteriorly. Electronic Signature(s) Signed: 08/07/2016 4:28:51 PM By: Linton Ham MD Entered By: Linton Ham on 08/07/2016 14:40:54 Maurice Delgado ([id number]) -------------------------------------------------------------------------------- Physical Exam Details Patient Name: Maurice Delgado Date of Service: 08/07/2016 1:45 PM Medical Record Number: [id number] Patient Account Number: [id number] Date of Birth/Sex: 1933/10/26 (83 y.o. Male) Treating RN: Baruch Gouty, RN, BSN, [place] Primary Care Provider: Gala Romney Other Clinician: Referring Provider: Gala Romney Treating Provider/Extender: Ricard Dillon Weeks in Treatment: 1 Constitutional Sitting or standing Blood Pressure is within target range for patient.. Pulse regular and within target range for patient.Marland Kitchen Respirations regular, non-labored and within target range.. Temperature is normal and within the target range for the patient.. Patient's appearance is neat and clean. Appears in no acute distress. Well nourished and well  developed.Marland Kitchen Respiratory Respiratory effort is easy and symmetric bilaterally. Rate is normal at rest and on room air.. Shallower air entry, no adventitious sounds. Cardiovascular A. fib no murmurs no S3 JVP not elevated.  Pedal pulses absent bilaterally.. Edema present in both extremities. I think most of the edema is related to venous insufficiency and secondary lymphedema rather than heart failure at least at the bedside.. Notes Wound exam; the patient has a linear wound on the right anterior leg. There is been a lot of healing here already in the base of this looks healthy. I see no need for ongoing Santyl use area oOn the left leg he has a small surface eschar on the upper anterior leg. This looks threatening enough/spontaneous enough to wrap the left leg. He is definitely going to need to be measured for compression stockings and if this fails external compression pumps Electronic Signature(s) Signed: 08/07/2016 4:28:51 PM By: Linton Ham MD Entered By: Linton Ham on 08/07/2016 14:46:52 Maurice Delgado ([id number]) -------------------------------------------------------------------------------- Physician Orders Details Patient Name: Maurice Delgado Date of Service: 08/07/2016 1:45 PM Medical Record Number: [id number] Patient Account Number: [id number] Date of Birth/Sex: 1933-10-19 (83 y.o. Male) Treating RN: Baruch Gouty, RN, BSN, [place] Primary Care Provider: Gala Romney Other Clinician: Referring Provider: Gala Romney Treating Provider/Extender: Tito Dine in Treatment: 1 Verbal / Phone Orders: No Diagnosis Coding Wound Cleansing Wound #1 Right,Anterior Lower Leg o Cleanse wound with mild soap and water o May shower with protection. o No tub bath. Wound #2 Left,Anterior Lower Leg o Cleanse wound with mild soap and water o May shower with protection. o No tub bath. Anesthetic Wound #1 Right,Anterior Lower Leg o Topical Lidocaine 4%  cream applied to wound bed prior to debridement Wound #2 Left,Anterior Lower Leg o Topical Lidocaine 4% cream applied to wound bed prior to debridement Skin Barriers/Peri-Wound Care Wound #1 Right,Anterior Lower Leg o Moisturizing lotion o Triamcinolone Acetonide Ointment Wound #2 Left,Anterior Lower Leg o Moisturizing lotion o Triamcinolone Acetonide Ointment Primary Wound Dressing Wound #1 Right,Anterior Lower Leg o Aquacel Ag Wound #2 Left,Anterior Lower Leg o Aquacel Ag Secondary Dressing Wound #1 Right,Anterior Lower Leg Cryer, [place]patrick ([id number]) o ABD pad Wound #2 Left,Anterior Lower Leg o ABD pad Dressing Change Frequency Wound #2 Left,Anterior Lower Leg o Change dressing every week Follow-up Appointments Wound #1 Right,Anterior Lower Leg o Return Appointment in 1 week. Edema Control Wound #1 Right,Anterior Lower Leg o 3 Layer Compression System - Bilateral o Elevate legs to the level of the heart and pump ankles as often as possible Wound #2 Left,Anterior Lower Leg o 3 Layer Compression System - Bilateral o Elevate legs to the level of the heart and pump ankles as often as possible Additional Orders / Instructions Wound #1 Right,Anterior Lower Leg o Activity as tolerated Wound #2 Left,Anterior Lower Leg o Activity as tolerated Services and Therapies o Arterial Studies- Bilateral - Unable to palpate pulses Electronic Signature(s) Signed: 08/08/2016 4:38:12 PM By: Regan Lemming BSN, RN Signed: 08/14/2016 7:47:55 AM By: Linton Ham MD Previous Signature: 08/07/2016 3:11:21 PM Version By: Regan Lemming BSN, RN Previous Signature: 08/07/2016 4:28:51 PM Version By: Linton Ham MD Entered By: Regan Lemming on 08/08/2016 12:38:05 Maurice Delgado ([id number]) -------------------------------------------------------------------------------- Problem List Details Patient Name: Maurice Delgado Date of Service: 08/07/2016 1:45  PM Medical Record Number: [id number] Patient Account Number: [id number] Date of Birth/Sex: 1933-08-07 (83 y.o. 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 I87.331 Chronic venous hypertension (idiopathic) with ulcer  and 07/31/2016 Yes inflammation of right lower extremity I89.0 Lymphedema, not elsewhere classified 07/31/2016 Yes Inactive Problems Resolved Problems Electronic Signature(s) Signed: 08/07/2016 4:28:51 PM By: Linton Ham MD Entered By: Linton Ham on 08/07/2016 14:34:50 Maurice Delgado ([id number]) -------------------------------------------------------------------------------- Progress Note Details Patient Name: Maurice Delgado Date of Service: 08/07/2016 1:45 PM Medical Record Number: [id number] Patient Account Number: [id number] Date of Birth/Sex: 08-17-1933 (83 y.o. Male) Treating RN: Baruch Gouty, RN, BSN, [place] Primary Care Provider: Gala Romney Other Clinician: Referring Provider: Gala Romney Treating Provider/Extender: Ricard Dillon Weeks in Treatment: 1 Subjective Chief Complaint Information obtained from Patient 07/31/16; patient is here for review of wound on his right anterior leg this been there for 2 months History of Present Illness (HPI) 07/31/16; this is an 83 year old man with multiple medical problems including chronic stage 3b congestive heart failure with an EF of 20-25%. He has a pacemaker in place. He also has COPD on chronic oxygen history of atrial fibrillation on chronic anticoagulation secondary to history of DVT with Coumadin. She was noted during his last visit to his cardiologist on 07/23/16 to have a wound on the  right lower extremity which was weeping. He was referred here for review of this. Also noted at the time that he has nonpalpable pedal pulses and an arterial Doppler was ordered however I don't see the results of this yet. He tells me that he has had a wound on his leg for about 2 months. He is not doing anything specific to this but was recently given Santyl ointment which she is applying. It would appear that he has chronic venous insufficiency issues although he is not been using compression at home. 08/17/16; this is a frail man with stage IIIB congestive heart failure with an ejection fraction of 20-25%. He has COPD on chronic oxygen. He came here last week with an open wound on his right anterior leg was weeping edema. Based on assessment I felt he had chronic venous insufficiency with secondary lymphedema. He had already had arterial Dopplers ordered by his consultant cardiologist before he came here although he is not had test done. He had a 3 layer wrap on the right leg which she appears to have tolerated quite well. In the meantime the unwrapped left leg is much bigger with a surface eschar anteriorly. Objective Constitutional Sitting or standing Blood Pressure is within target range for patient.. Pulse regular and within target range for patient.Marland Kitchen Respirations regular, non-labored and within target range.. Temperature is normal and within the target range for the patient.. Patient's appearance is neat and clean. Appears in no acute distress. Well nourished and well developed.. Vitals Time Taken: 1:52 PM, Height: 74 in, Weight: 296 lbs, BMI: 38, Temperature: 97.5 F, Pulse: 90 bpm, Respiratory Rate: 20 breaths/min, Blood Pressure: 116/64 mmHg. LORENCE, DUSING ([id number]) Respiratory Respiratory effort is easy and symmetric bilaterally. Rate is normal at rest and on room air.. Shallower air entry, no adventitious sounds. Cardiovascular A. fib no murmurs no S3 JVP not elevated.  Pedal pulses absent bilaterally.. Edema present in both extremities. I think most of the edema is related to venous insufficiency and secondary lymphedema rather than heart failure at least at the bedside.. General Notes: Wound exam; the patient has a linear wound on the right anterior leg. There is been a lot of healing here already in the base of this looks healthy. I see no need for ongoing Santyl use area On the left leg he has a small surface eschar on the upper anterior  leg. This looks threatening enough/spontaneous enough to wrap the left leg. He is definitely going to need to be measured for compression stockings and if this fails external compression pumps Integumentary (Hair, Skin) Wound #1 status is Open. Original cause of wound was Gradually Appeared. The wound is located on the Right,Anterior Lower Leg. The wound measures 13.3cm length x 2cm width x 0.1cm depth; 20.892cm^2 area and 2.089cm^3 volume. The wound is limited to skin breakdown. There is no tunneling or undermining noted. There is a large amount of serosanguineous drainage noted. The wound margin is distinct with the outline attached to the wound base. There is medium (34-66%) pink granulation within the wound bed. There is a medium (34-66%) amount of necrotic tissue within the wound bed including Adherent Slough. The periwound skin appearance exhibited: Hemosiderin Staining. Periwound temperature was noted as No Abnormality. Wound #2 status is Open. Original cause of wound was Gradually Appeared. The wound is located on the Left,Anterior Lower Leg. The wound measures 1cm length x 1cm width x 0.1cm depth; 0.785cm^2 area and 0.079cm^3 volume. The wound is limited to skin breakdown. There is no tunneling or undermining noted. There is a small amount of serous drainage noted. The wound margin is flat and intact. There is no granulation within the wound bed. There is a large (67-100%) amount of necrotic tissue within the  wound bed including Eschar. The periwound skin appearance exhibited: Hemosiderin Staining, Mottled. Periwound temperature was noted as No Abnormality. Assessment Active Problems ICD-10 L97.211 - Non-pressure chronic ulcer of right calf limited to breakdown of skin I87.331 - Chronic venous hypertension (idiopathic) with ulcer and inflammation of right lower extremity I89.0 - Lymphedema, not elsewhere classified HOWE, CHARLESWORTH ([id number]) Procedures Wound #1 Wound #1 is a Venous Leg Ulcer located on the Right,Anterior Lower Leg . There was a Three Layer Compression Therapy Procedure by Cornell Barman, RN. Post procedure Diagnosis Wound #1: Same as Pre-Procedure Wound #2 Wound #2 is a Lymphedema located on the Left,Anterior Lower Leg . There was a Three Layer Compression Therapy Procedure by Cornell Barman, RN. Post procedure Diagnosis Wound #2: Same as Pre-Procedure Plan Wound Cleansing: Wound #1 Right,Anterior Lower Leg: Cleanse wound with mild soap and water May shower with protection. No tub bath. Wound #2 Left,Anterior Lower Leg: Cleanse wound with mild soap and water May shower with protection. No tub bath. Anesthetic: Wound #1 Right,Anterior Lower Leg: Topical Lidocaine 4% cream applied to wound bed prior to debridement Wound #2 Left,Anterior Lower Leg: Topical Lidocaine 4% cream applied to wound bed prior to debridement Skin Barriers/Peri-Wound Care: Wound #1 Right,Anterior Lower Leg: Moisturizing lotion Triamcinolone Acetonide Ointment Wound #2 Left,Anterior Lower Leg: Moisturizing lotion Triamcinolone Acetonide Ointment Primary Wound Dressing: Wound #1 Right,Anterior Lower Leg: Aquacel Ag Wound #2 Left,Anterior Lower Leg: Aquacel Ag Secondary Dressing: THINH, BEUTLER ([id number]) Wound #1 Right,Anterior Lower Leg: ABD pad Wound #2 Left,Anterior Lower Leg: ABD pad Dressing Change Frequency: Wound #2 Left,Anterior Lower Leg: Change dressing every  week Follow-up Appointments: Wound #1 Right,Anterior Lower Leg: Return Appointment in 1 week. Edema Control: Wound #1 Right,Anterior Lower Leg: 3 Layer Compression System - Bilateral Elevate legs to the level of the heart and pump ankles as often as possible Wound #2 Left,Anterior Lower Leg: 3 Layer Compression System - Bilateral Elevate legs to the level of the heart and pump ankles as often as possible Additional Orders / Instructions: Wound #1 Right,Anterior Lower Leg: Activity as tolerated Wound #2 Left,Anterior Lower Leg: Activity as tolerated Services and Therapies ordered were:  Arterial Studies- Bilateral - Unable to palpate pulses Aquacel Ag to both areas on the right lower and left upper leg he will need stocking no clear evidence of chf at the bedside Electronic Signature(s) Signed: 08/13/2016 4:52:01 PM By: Gretta Cool, RN, BSN, Kim RN, BSN Signed: 08/14/2016 7:47:55 AM By: Linton Ham MD Previous Signature: 08/08/2016 12:39:04 PM Version By: Regan Lemming BSN, RN Previous Signature: 08/07/2016 4:28:51 PM Version By: Linton Ham MD Entered By: Gretta Cool, RN, BSN, Kim on 08/13/2016 16:52:01 Maurice Delgado ([id number]) -------------------------------------------------------------------------------- SuperBill Details Patient Name: Maurice Delgado Date of Service: 08/07/2016 Medical Record Number: [id number] Patient Account Number: [id number] Date of Birth/Sex: [birth year] (83 y.o. Male) Treating RN: Baruch Gouty, RN, BSN, [place] Primary Care Provider: Gala Romney Other Clinician: Referring Provider: Gala Romney Treating Provider/Extender: Ricard Dillon Service Line: Outpatient Weeks in Treatment: 1 Diagnosis Coding ICD-10 Codes Code Description L97.211 Non-pressure chronic ulcer of right calf limited to breakdown of skin Chronic venous hypertension (idiopathic) with ulcer and inflammation of right lower I87.331 extremity I89.0 Lymphedema, not elsewhere  classified Facility Procedures CPT4: Description Modifier Quantity Code [id number] BILATERAL: Application of multi-layer venous compression 1 system; leg (below knee), including ankle and foot. Physician Procedures CPT4: Description Modifier Quantity Code [id number] - WC PHYS LEVEL 3 - EST PT 1 ICD-10 Description Diagnosis L97.211 Non-pressure chronic ulcer of right calf limited to breakdown of skin I87.331 Chronic venous hypertension (idiopathic) with ulcer and  inflammation of right lower extremity I89.0 Lymphedema, not elsewhere classified Electronic Signature(s) Signed: 08/07/2016 3:11:21 PM By: Regan Lemming BSN, RN Signed: 08/07/2016 4:28:51 PM By: Linton Ham MD Entered By: Regan Lemming on 08/07/2016 14:50:13

## 2016-08-09 ENCOUNTER — Ambulatory Visit: Payer: Medicare Other

## 2016-08-09 DIAGNOSIS — L98499 Non-pressure chronic ulcer of skin of other sites with unspecified severity: Secondary | ICD-10-CM | POA: Diagnosis not present

## 2016-08-12 ENCOUNTER — Telehealth (HOSPITAL_COMMUNITY): Payer: Self-pay | Admitting: *Deleted

## 2016-08-12 ENCOUNTER — Encounter: Payer: Self-pay | Admitting: *Deleted

## 2016-08-12 ENCOUNTER — Ambulatory Visit: Payer: Medicare Other | Admitting: Cardiovascular Disease

## 2016-08-12 DIAGNOSIS — I5022 Chronic systolic (congestive) heart failure: Secondary | ICD-10-CM

## 2016-08-12 NOTE — Telephone Encounter (Signed)
Notes Recorded by Scarlette Calico, RN on 08/12/2016 at 4:43 PM EST Pt aware, will repeat Thur 2/15 ------  Notes Recorded by Kennieth Rad, RN on 08/05/2016 at 2:47 PM EST Called but had to leave message asking for him to call us back. ------  Notes Recorded by Larey Dresser, MD on 08/01/2016 at 3:35 PM EST OK ------  Notes Recorded by Larey Dresser, MD on 08/01/2016 at 3:06 PM EST Creatinine a bit higher. Repeat BMET in 1 week.

## 2016-08-13 ENCOUNTER — Encounter: Payer: Medicare Other | Admitting: Internal Medicine

## 2016-08-13 DIAGNOSIS — L97211 Non-pressure chronic ulcer of right calf limited to breakdown of skin: Secondary | ICD-10-CM | POA: Diagnosis not present

## 2016-08-13 DIAGNOSIS — Z950 Presence of cardiac pacemaker: Secondary | ICD-10-CM | POA: Diagnosis not present

## 2016-08-13 DIAGNOSIS — I87331 Chronic venous hypertension (idiopathic) with ulcer and inflammation of right lower extremity: Secondary | ICD-10-CM | POA: Diagnosis not present

## 2016-08-13 DIAGNOSIS — I890 Lymphedema, not elsewhere classified: Secondary | ICD-10-CM | POA: Diagnosis not present

## 2016-08-13 DIAGNOSIS — L97819 Non-pressure chronic ulcer of other part of right lower leg with unspecified severity: Secondary | ICD-10-CM | POA: Diagnosis not present

## 2016-08-13 DIAGNOSIS — I87313 Chronic venous hypertension (idiopathic) with ulcer of bilateral lower extremity: Secondary | ICD-10-CM | POA: Diagnosis not present

## 2016-08-13 DIAGNOSIS — J449 Chronic obstructive pulmonary disease, unspecified: Secondary | ICD-10-CM | POA: Diagnosis not present

## 2016-08-13 DIAGNOSIS — L97829 Non-pressure chronic ulcer of other part of left lower leg with unspecified severity: Secondary | ICD-10-CM | POA: Diagnosis not present

## 2016-08-13 DIAGNOSIS — Z86718 Personal history of other venous thrombosis and embolism: Secondary | ICD-10-CM | POA: Diagnosis not present

## 2016-08-14 NOTE — Progress Notes (Signed)
TYJON, NELLI ([id number]) Visit Report for 08/13/2016 Chief Complaint Document Details Patient Name: Maurice Delgado, Maurice Delgado Date of Service: 08/13/2016 8:15 AM Medical Record Number: [id number] Patient Account Number: [id number] Date of Birth/Sex: 06/29/1934 (83 y.o. Male) Treating RN: Baruch Gouty, RN, BSN, [place] Primary Care Provider: Gala Romney Other Clinician: Referring Provider: Gala Romney Treating Provider/Extender: Ricard Dillon Weeks in Treatment: 1 Information Obtained from: Patient Chief Complaint 07/31/16; patient is here for review of wound on his right anterior leg this been there for 2 months Electronic Signature(s) Signed: 08/14/2016 7:47:19 AM By: Linton Ham MD Entered By: Linton Ham on 08/13/2016 09:21:55 Maurice Delgado ([id number]) -------------------------------------------------------------------------------- HPI Details Patient Name: Maurice Delgado Date of Service: 08/13/2016 8:15 AM Medical Record Number: [id number] Patient Account Number: [id number] Date of Birth/Sex: Sep 09, 1933 (83 y.o. Male) Treating RN: Baruch Gouty, RN, BSN, [place] Primary Care Provider: Gala Romney Other Clinician: Referring Provider: Gala Romney Treating Provider/Extender: Ricard Dillon Weeks in Treatment: 1 History of Present Illness HPI Description: 07/31/16; this is an 83 year old man with multiple medical problems including chronic stage 3b congestive heart failure with an EF of 20-25%. He has a pacemaker in place. He also has COPD on chronic oxygen history of atrial fibrillation on chronic anticoagulation secondary to history of DVT with Coumadin. She was noted during his last visit to his cardiologist on 07/23/16 to have a wound on the right lower extremity which was weeping. He was referred here for review of this. Also noted at the time that he has nonpalpable pedal pulses and an arterial Doppler was ordered however I don't see the results of this yet. He tells  me that he has had a wound on his leg for about 2 months. He is not doing anything specific to this but was recently given Santyl ointment which she is applying. It would appear that he has chronic venous insufficiency issues although he is not been using compression at home. 08/07/16; this is a frail man with stage IIIB congestive heart failure with an ejection fraction of 20-25%. He has COPD on chronic oxygen. He came here last week with an open wound on his right anterior leg was weeping edema. Based on assessment I felt he had chronic venous insufficiency with secondary lymphedema. He had already had arterial Dopplers ordered by his consultant cardiologist before he came here although he is not had test done. He had a 3 layer wrap on the right leg which she appears to have tolerated quite well. In the meantime the unwrapped left leg is much bigger with a surface eschar anteriorly. 08/13/16; patient arrives today with the wound on his right anterior leg totally epithelialized. His new area on the left anterior leg from last week is also almost fully epithelialized but still looks somewhat vulnerable to reopening. He has severe bilateral chronic venous insufficiency secondary lymphedema. On top of this he has stage IIIB congestive heart failure with an ejection fraction of 20-25%. He has no real complaints today except the inability to take a shower with wraps on his bilateral legs Electronic Signature(s) Signed: 08/14/2016 7:47:19 AM By: Linton Ham MD Entered By: Linton Ham on 08/13/2016 09:23:20 Maurice Delgado ([id number]) -------------------------------------------------------------------------------- Physical Exam Details Patient Name: Maurice Delgado Date of Service: 08/13/2016 8:15 AM Medical Record Number: [id number] Patient Account Number: [id number] Date of Birth/Sex: 28-Mar-1934 (83 y.o. Male) Treating RN: Baruch Gouty, RN, BSN, [place] Primary Care Provider: Gala Romney Other  Clinician: Referring Provider: Gala Romney Treating Provider/Extender: Ricard Dillon Weeks in Treatment: 1 Constitutional Sitting or standing Blood  Pressure is within target range for patient.. Pulse regular and within target range for patient.Marland Kitchen Respirations regular, non-labored and within target range.. Temperature is normal and within the target range for the patient.. Patient's appearance is neat and clean. Appears in no acute distress. Well nourished and well developed.. Eyes Conjunctivae clear. No discharge.Marland Kitchen Respiratory Respiratory effort is easy and symmetric bilaterally. Rate is normal at rest and on room air.. Cardiovascular Pedal pulses are palpable bilaterally. Edema present in both extremities. Left greater than right although his edema today is controlled. Lymphatic None palpable in the popliteal or inguinal area. Psychiatric Patient appears depressed today.. Notes Wound exam; the area on the right anterior leg is totally epithelialized and resolved. He has a small round area on the left anterior leg upper aspect which is epithelializing as well. He has severe bilateral venous insufficiency with likely secondary lymphedema left greater than right leg. This edema is going to need to be controlled if we are to keep him from having recurrent skin breakdown. I don't think he has a primary arterial issue Electronic Signature(s) Signed: 08/14/2016 7:47:19 AM By: Linton Ham MD Entered By: Linton Ham on 08/13/2016 [id number] Maurice Delgado ([id number]) -------------------------------------------------------------------------------- Physician Orders Details Patient Name: Maurice Delgado Date of Service: 08/13/2016 8:15 AM Medical Record Number: [id number] Patient Account Number: [id number] Date of Birth/Sex: [birth year] (83 y.o. Male) Treating RN: Cornell Barman Primary Care Provider: Gala Romney Other Clinician: Referring Provider: Gala Romney Treating  Provider/Extender: Tito Dine in Treatment: 1 Verbal / Phone Orders: No Diagnosis Coding ICD-10 Coding Code Description L97.211 Non-pressure chronic ulcer of right calf limited to breakdown of skin Chronic venous hypertension (idiopathic) with ulcer and inflammation of right lower I87.331 extremity I89.0 Lymphedema, not elsewhere classified Wound Cleansing Wound #1 Right,Anterior Lower Leg o Cleanse wound with mild soap and water o May shower with protection. o No tub bath. Wound #2 Left,Anterior Lower Leg o Cleanse wound with mild soap and water o May shower with protection. o No tub bath. Primary Wound Dressing Wound #1 Right,Anterior Lower Leg o Aquacel Ag Wound #2 Left,Anterior Lower Leg o Aquacel Ag Secondary Dressing Wound #1 Right,Anterior Lower Leg o ABD pad Wound #2 Left,Anterior Lower Leg o ABD pad Dressing Change Frequency Wound #1 Right,Anterior Lower Leg o Change dressing every week Maurice Delgado, Maurice Delgado ([id number]) Wound #2 Left,Anterior Lower Leg o Change dressing every week Follow-up Appointments Wound #1 Right,Anterior Lower Leg o Return Appointment in 1 week. Wound #2 Left,Anterior Lower Leg o Return Appointment in 1 week. Edema Control Wound #1 Right,Anterior Lower Leg o 3 Layer Compression System - Bilateral o Elevate legs to the level of the heart and pump ankles as often as possible Wound #2 Left,Anterior Lower Leg o 3 Layer Compression System - Bilateral o Elevate legs to the level of the heart and pump ankles as often as possible Additional Orders / Instructions Wound #1 Right,Anterior Lower Leg o Activity as tolerated Wound #2 Left,Anterior Lower Leg o Activity as tolerated Electronic Signature(s) Signed: 08/13/2016 4:41:51 PM By: Gretta Cool, RN, BSN, Kim RN, BSN Signed: 08/14/2016 7:47:19 AM By: Linton Ham MD Entered By: Gretta Cool, RN, BSN, Kim on 08/13/2016 09:24:35 Maurice Delgado  ([id number]) -------------------------------------------------------------------------------- Problem List Details Patient Name: Maurice Delgado Date of Service: 08/13/2016 8:15 AM Medical Record Number: [id number] Patient Account Number: [id number] Date of Birth/Sex: 1934-04-24 (83 y.o. Male) Treating RN: Baruch Gouty, RN, BSN, 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 right calf limited to 07/31/2016 Yes breakdown of skin I87.331 Chronic venous hypertension (idiopathic) with ulcer and 07/31/2016 Yes inflammation of right lower extremity I89.0 Lymphedema, not elsewhere classified 07/31/2016 Yes Inactive Problems Resolved Problems Electronic Signature(s) Signed: 08/14/2016 7:47:19 AM By: Linton Ham MD Entered By: Linton Ham on 08/13/2016 09:21:25 Maurice Delgado ([id number]) -------------------------------------------------------------------------------- Progress Note Details Patient Name: Maurice Delgado Date of Service: 08/13/2016 8:15 AM Medical Record Number: [id number] Patient Account Number: [id number] Date of Birth/Sex: 1933/10/20 (83 y.o. Male) Treating RN: Baruch Gouty, RN, BSN, [place] Primary Care Provider: Gala Romney Other Clinician: Referring Provider: Gala Romney Treating Provider/Extender: Ricard Dillon Weeks in Treatment: 1 Subjective Chief Complaint Information obtained from Patient 07/31/16; patient is here for review of wound on his right anterior leg this been there for 2 months History of Present Illness (HPI) 07/31/16; this is an 83 year old man with multiple medical problems including chronic stage 3b congestive heart failure with an EF of 20-25%. He has a pacemaker in place. He also has COPD on chronic oxygen history  of atrial fibrillation on chronic anticoagulation secondary to history of DVT with Coumadin. She was noted during his last visit to his cardiologist on 07/23/16 to have a wound on the right lower extremity which was weeping. He was referred here for review of this. Also noted at the time that he has nonpalpable pedal pulses and an arterial Doppler was ordered however I don't see the results of this yet. He tells me that he has had a wound on his leg for about 2 months. He is not doing anything specific to this but was recently given Santyl ointment which she is applying. It would appear that he has chronic venous insufficiency issues although he is not been using compression at home. 08/07/16; this is a frail man with stage IIIB congestive heart failure with an ejection fraction of 20-25%. He has COPD on chronic oxygen. He came here last week with an open wound on his right anterior leg was weeping edema. Based on assessment I felt he had chronic venous insufficiency with secondary lymphedema. He had already had arterial Dopplers ordered by his consultant cardiologist before he came here although he is not had test done. He had a 3 layer wrap on the right leg which she appears to have tolerated quite well. In the meantime the unwrapped left leg is much bigger with a surface eschar anteriorly. 08/13/16; patient arrives today with the wound on his right anterior leg totally epithelialized. His new area on the left anterior leg from last week is also almost fully epithelialized but still looks somewhat vulnerable to reopening. He has severe bilateral chronic venous insufficiency secondary lymphedema. On top of this he has stage IIIB congestive heart failure with an ejection fraction of 20-25%. He has no real complaints today except the inability to take a shower with wraps on his bilateral legs Objective Constitutional Sitting or standing Blood Pressure is within target range for patient.. Pulse regular  and within target range for patient.Marland Kitchen Respirations regular, non-labored and within target range.. Temperature is normal and within the target range for the patient.. Patient's appearance is neat and clean. Appears in no acute distress. Well Maurice Delgado, Maurice Delgado ([id number]) nourished and well developed.. Vitals Time Taken: 8:27 AM, Height: 74 in, Weight: 296 lbs, BMI: 38, Temperature: 97.8 F, Pulse: 75 bpm, Respiratory Rate: 18 breaths/min, Blood Pressure: 101/65 mmHg. General Notes: Patient on 2L of O2  Eyes Conjunctivae clear. No discharge.Marland Kitchen Respiratory Respiratory effort is easy and symmetric bilaterally. Rate is normal at rest and on room air.. Cardiovascular Pedal pulses are palpable bilaterally. Edema present in both extremities. Left greater than right although his edema today is controlled. Lymphatic None palpable in the popliteal or inguinal area. Psychiatric Patient appears depressed today.. General Notes: Wound exam; the area on the right anterior leg is totally epithelialized and resolved. He has a small round area on the left anterior leg upper aspect which is epithelializing as well. He has severe bilateral venous insufficiency with likely secondary lymphedema left greater than right leg. This edema is going to need to be controlled if we are to keep him from having recurrent skin breakdown. I don't think he has a primary arterial issue Integumentary (Hair, Skin) Wound #1 status is Open. Original cause of wound was Gradually Appeared. The wound is located on the Right,Anterior Lower Leg. The wound measures 0.1cm length x 0.1cm width x 0.1cm depth; 0.008cm^2 area and 0.001cm^3 volume. Wound #2 status is Open. Original cause of wound was Gradually Appeared. The wound is located on the Left,Anterior Lower Leg. The wound measures 0.1cm length x 0.1cm width x 0.1cm depth; 0.008cm^2 area and 0.001cm^3 volume. Assessment Active Problems ICD-10 L97.211 - Non-pressure chronic  ulcer of right calf limited to breakdown of skin I87.331 - Chronic venous hypertension (idiopathic) with ulcer and inflammation of right lower extremity I89.0 - Lymphedema, not elsewhere classified Maurice Delgado, Maurice Delgado ([id number]) Plan Wound Cleansing: Wound #1 Right,Anterior Lower Leg: Cleanse wound with mild soap and water May shower with protection. No tub bath. Wound #2 Left,Anterior Lower Leg: Cleanse wound with mild soap and water May shower with protection. No tub bath. Primary Wound Dressing: Wound #1 Right,Anterior Lower Leg: Aquacel Ag Wound #2 Left,Anterior Lower Leg: Aquacel Ag Secondary Dressing: Wound #1 Right,Anterior Lower Leg: ABD pad Wound #2 Left,Anterior Lower Leg: ABD pad Dressing Change Frequency: Wound #1 Right,Anterior Lower Leg: Change dressing every week Wound #2 Left,Anterior Lower Leg: Change dressing every week Follow-up Appointments: Wound #1 Right,Anterior Lower Leg: Return Appointment in 1 week. Wound #2 Left,Anterior Lower Leg: Return Appointment in 1 week. Edema Control: Wound #1 Right,Anterior Lower Leg: 3 Layer Compression System - Bilateral Elevate legs to the level of the heart and pump ankles as often as possible Wound #2 Left,Anterior Lower Leg: 3 Layer Compression System - Bilateral Elevate legs to the level of the heart and pump ankles as often as possible Additional Orders / Instructions: Wound #1 Right,Anterior Lower Leg: Activity as tolerated Wound #2 Left,Anterior Lower Leg: Activity as tolerated Delgado, Maurice ([id number]) #1 I'm going to wrap both legs again today. Dress the small wound on the left leg. We have ordered compression stockings bilaterally 30-40 mmHg but they are not in yet. #2 given this man's severe cardiomyopathy, I don't think he is eligible for surgery of any form. #3 I've talked to him about lubricating the skin, using his compression stockings and elevating his legs. If this fails to prevent  recurrent ulceration he will need external compression pumps Electronic Signature(s) Signed: 08/14/2016 7:47:19 AM By: Linton Ham MD Entered By: Linton Ham on 08/13/2016 [id number] Maurice Delgado ([id number]) -------------------------------------------------------------------------------- SuperBill Details Patient Name: Maurice Delgado Date of Service: 08/13/2016 Medical Record Number: [id number] Patient Account Number: [id number] Date of Birth/Sex: [birth year] (83 y.o. Male) Treating RN: Baruch Gouty, RN, BSN, [place] Primary Care Provider: Gala Romney Other Clinician: Referring Provider: Gala Romney Treating Provider/Extender: Ricard Dillon Service Line: Outpatient Weeks in Treatment:  1 Diagnosis Coding ICD-10 Codes Code Description [id number] Non-pressure chronic ulcer of right calf limited to breakdown of skin Chronic venous hypertension (idiopathic) with ulcer and inflammation of right lower I87.331 extremity I89.0 Lymphedema, not elsewhere classified L97.221 Non-pressure chronic ulcer of left calf limited to breakdown of skin Physician Procedures CPT4 Code Description: [id number] - WC PHYS LEVEL 3 - EST PT ICD-10 Description Diagnosis L97.211 Non-pressure chronic ulcer of right calf limited to L97.221 Non-pressure chronic ulcer of left calf limited to Modifier: breakdown of breakdown of s Quantity: 1 skin kin Electronic Signature(s) Signed: 08/14/2016 7:47:19 AM By: Linton Ham MD Entered By: Linton Ham on 08/13/2016 09:29:39

## 2016-08-14 NOTE — Progress Notes (Signed)
DEUCE, CALVERLEY ([id number]) Visit Report for 08/13/2016 Arrival Information Details Patient Name: Maurice Delgado, Maurice Delgado Date of Service: 08/13/2016 8:15 AM Medical Record Number: [id number] Patient Account Number: [id number] Date of Birth/Sex: 1933/10/11 (83 y.o. Male) Treating RN: Cornell Barman Primary Care Avin Gibbons: Gala Romney Other Clinician: Referring Shereda Graw: Gala Romney Treating Leobardo Granlund/Extender: Tito Dine in Treatment: 1 Visit Information History Since Last Visit Added or deleted any medications: No Patient Arrived: [place] Any new allergies or adverse reactions: No Arrival Time: 08:26 Had a fall or experienced change in No Accompanied By: wife activities of daily living that may affect Transfer Assistance: None risk of falls: Patient Identification Yes Signs or symptoms of abuse/neglect since last No Verified: visito Secondary Verification Yes Hospitalized since last visit: No Process Completed: Has Dressing in Place as Prescribed: Yes Patient Has Alerts: Yes Has Compression in Place as Prescribed: Yes Patient Alerts: Patient on Blood Thinner Pain Present Now: No COUMADIN,PACEMAKER Electronic Signature(s) Signed: 08/13/2016 4:41:51 PM By: Gretta Cool, RN, BSN, Kim RN, BSN Entered By: Gretta Cool, RN, BSN, Kim on 08/13/2016 08:27:15 Maurice Delgado ([id number]) -------------------------------------------------------------------------------- Encounter Discharge Information Details Patient Name: Maurice Delgado Date of Service: 08/13/2016 8:15 AM Medical Record Number: [id number] Patient Account Number: [id number] Date of Birth/Sex: Jan 22, 1934 (83 y.o. Male) Treating RN: Baruch Gouty, RN, BSN, [place] Primary Care Ivyrose Hashman: Gala Romney Other Clinician: Referring Karlin Binion: Gala Romney Treating Jolleen Seman/Extender: Tito Dine in Treatment: 1 Encounter Discharge Information Items Schedule Follow-up Appointment: No Medication Reconciliation  completed No and provided to Patient/Care Ruhani Umland: Provided on Clinical Summary of Care: 08/13/2016 Form Type Recipient Paper Patient AI Electronic Signature(s) Signed: 08/13/2016 9:27:32 AM By: Ruthine Dose Entered By: Ruthine Dose on 08/13/2016 09:27:32 Maurice Delgado ([id number]) -------------------------------------------------------------------------------- Lower Extremity Assessment Details Patient Name: Maurice Delgado Date of Service: 08/13/2016 8:15 AM Medical Record Number: [id number] Patient Account Number: [id number] Date of Birth/Sex: [birth year] (83 y.o. Male) Treating RN: Cornell Barman Primary Care Natascha Edmonds: Gala Romney Other Clinician: Referring Brennan Litzinger: Gala Romney Treating Serenitee Fuertes/Extender: Ricard Dillon Weeks in Treatment: 1 Edema Assessment Assessed: [Left: No] [Right: No] E[Left: dema] [Right: :] Calf Left: Right: Point of Measurement: 41 cm From Medial Instep 52 cm 45.5 cm Ankle Left: Right: Point of Measurement: 10 cm From Medial Instep 29.5 cm 29 cm Vascular Assessment Pulses: Dorsalis Pedis Palpable: [Left:Yes] [Right:Yes] Doppler Audible: [Left:Yes] [Right:Yes] Posterior Tibial Extremity colors, hair growth, and conditions: Extremity Color: [Left:Hyperpigmented] [Right:Hyperpigmented] Hair Growth on Extremity: [Left:No] [Right:No] Temperature of Extremity: [Left:Warm] [Right:Warm] Capillary Refill: [Left:< 3 seconds] [Right:< 3 seconds] Dependent Rubor: [Left:No] [Right:No] Blanched when Elevated: [Left:No] [Right:No] Lipodermatosclerosis: [Left:No] [Right:No] Toe Nail Assessment Left: Right: Thick: Yes Yes Discolored: Yes Yes Deformed: Yes Yes Improper Length and Hygiene: Yes Yes Electronic Signature(sWELLMAN, Maurice Delgado ([id number]) Signed: 08/13/2016 4:41:51 PM By: Gretta Cool, RN, BSN, Kim RN, BSN Entered By: Gretta Cool, RN, BSN, Kim on 08/13/2016 08:43:58 Maurice Delgado  ([id number]) -------------------------------------------------------------------------------- Multi Wound Chart Details Patient Name: Maurice Delgado Date of Service: 08/13/2016 8:15 AM Medical Record Number: [id number] Patient Account Number: [id number] Date of Birth/Sex: August 28, 1933 (83 y.o. Male) Treating RN: Baruch Gouty, RN, BSN, [place] Primary Care Tavio Biegel: Gala Romney Other Clinician: Referring Chaniece Barbato: Gala Romney Treating Brandie Lopes/Extender: Ricard Dillon Weeks in Treatment: 1 Vital Signs Height(in): 74 Pulse(bpm): 75 Weight(lbs): 296 Blood Pressure 101/65 (mmHg): Body Mass Index(BMI): 38 Temperature(F): 97.8 Respiratory Rate 18 (breaths/min): Photos: [1:No Photos] [2:No Photos] [N/A:N/A] Wound Location: [1:Right, Anterior Lower Leg] [2:Left, Anterior Lower Leg] [N/A:N/A] Wounding Event: [1:Gradually Appeared] [2:Gradually Appeared] [N/A:N/A] Primary Etiology: [1:Venous Leg Ulcer] [2:Lymphedema] [N/A:N/A] Date  Acquired: [1:07/01/2016] [2:08/07/2016] [N/A:N/A] Weeks of Treatment: [1:1] [2:0] [N/A:N/A] Wound Status: [1:Open] [2:Open] [N/A:N/A] Measurements L x W x D 0.1x0.1x0.1 [2:0.1x0.1x0.1] [N/A:N/A] (cm) Area (cm) : [1:0.008] [2:0.008] [N/A:N/A] Volume (cm) : [1:0.001] [2:0.001] [N/A:N/A] % Reduction in Area: [1:99.90%] [2:99.00%] [N/A:N/A] % Reduction in Volume: 99.90% [2:98.70%] [N/A:N/A] Classification: [1:Full Thickness Without Exposed Support Structures] [2:Partial Thickness] [N/A:N/A] Periwound Skin Texture: No Abnormalities Noted [2:No Abnormalities Noted] [N/A:N/A] Periwound Skin [1:No Abnormalities Noted] [2:No Abnormalities Noted] [N/A:N/A] Moisture: Periwound Skin Color: No Abnormalities Noted [2:No Abnormalities Noted] [N/A:N/A] Tenderness on [1:No] [2:No] [N/A:N/A] Treatment Notes Electronic Signature(s) Signed: 08/13/2016 4:41:51 PM By: Gretta Cool, RN, BSN, Kim RN, BSN Entered By: Gretta Cool, RN, BSN, Kim on 08/13/2016 09:23:15 Maurice Delgado  ([id number]) Maurice Delgado ([id number]) -------------------------------------------------------------------------------- Multi-Disciplinary Care Plan Details Patient Name: Maurice Delgado Date of Service: 08/13/2016 8:15 AM Medical Record Number: [id number] Patient Account Number: [id number] Date of Birth/Sex: 01-06-1934 (83 y.o. Male) Treating RN: Cornell Barman Primary Care Rickey Sadowski: Gala Romney Other Clinician: Referring Quinne Pires: Gala Romney Treating Eufelia Veno/Extender: Ricard Dillon Weeks in Treatment: 1 Active Inactive ` Abuse / Safety / Falls / Self Care Management Nursing Diagnoses: Impaired home maintenance Impaired physical mobility Knowledge deficit related to: safety; personal, health (wound), emergency Potential for falls Self care deficit: actual or potential Goals: Patient will remain injury free Date Initiated: 07/31/2016 Target Resolution Date: 10/24/2016 Goal Status: Active Patient/caregiver will verbalize understanding of skin care regimen Date Initiated: 07/31/2016 Target Resolution Date: 10/24/2016 Goal Status: Active Patient/caregiver will verbalize/demonstrate measures taken to improve the patient's personal safety Date Initiated: 07/31/2016 Target Resolution Date: 10/24/2016 Goal Status: Active Patient/caregiver will verbalize/demonstrate measures taken to prevent injury and/or falls Date Initiated: 07/31/2016 Target Resolution Date: 10/24/2016 Goal Status: Active Interventions: Assess fall risk on admission and as needed Assess: immobility, friction, shearing, incontinence upon admission and as needed Assess impairment of mobility on admission and as needed per policy Assess self care needs on admission and as needed Provide education on basic hygiene Provide education on personal and home safety Provide education on safe transfers Treatment Activities: Education provided on Basic Hygiene : 08/07/2016 Maurice Delgado, Maurice Delgado  ([id number]) Notes: ` Orientation to the Wound Care Program Nursing Diagnoses: Knowledge deficit related to the wound healing center program Goals: Patient/caregiver will verbalize understanding of the [place] Date Initiated: 07/31/2016 Target Resolution Date: 10/24/2016 Goal Status: Active Interventions: Provide education on orientation to the wound center Notes: ` Venous Leg Ulcer Nursing Diagnoses: Knowledge deficit related to disease process and management Potential for venous Insuffiency (use before diagnosis confirmed) Goals: Non-invasive venous studies are completed as ordered Date Initiated: 07/31/2016 Target Resolution Date: 10/24/2016 Goal Status: Active Patient will maintain optimal edema control Date Initiated: 07/31/2016 Target Resolution Date: 10/24/2016 Goal Status: Active Patient/caregiver will verbalize understanding of disease process and disease management Date Initiated: 07/31/2016 Target Resolution Date: 10/24/2016 Goal Status: Active Verify adequate tissue perfusion prior to therapeutic compression application Date Initiated: 07/31/2016 Target Resolution Date: 10/24/2016 Goal Status: Active Interventions: Assess peripheral edema status every visit. Compression as ordered Provide education on venous insufficiency Treatment Activities: Therapeutic compression applied : 07/31/2016 Maurice Delgado, Maurice Delgado ([id number]) Notes: ` Wound/Skin Impairment Nursing Diagnoses: Impaired tissue integrity Knowledge deficit related to ulceration/compromised skin integrity Goals: Patient/caregiver will verbalize understanding of skin care regimen Date Initiated: 07/31/2016 Target Resolution Date: 10/24/2016 Goal Status: Active Ulcer/skin breakdown will have a volume reduction of 30% by week 4 Date Initiated: 07/31/2016 Target Resolution Date: 10/24/2016 Goal Status: Active Ulcer/skin breakdown will have a volume reduction of 50% by  week 8 Date Initiated:  07/31/2016 Target Resolution Date: 10/24/2016 Goal Status: Active Ulcer/skin breakdown will have a volume reduction of 80% by week 12 Date Initiated: 07/31/2016 Target Resolution Date: 10/24/2016 Goal Status: Active Ulcer/skin breakdown will heal within 14 weeks Date Initiated: 07/31/2016 Target Resolution Date: 10/24/2016 Goal Status: Active Interventions: Assess patient/caregiver ability to obtain necessary supplies Assess patient/caregiver ability to perform ulcer/skin care regimen upon admission and as needed Assess ulceration(s) every visit Provide education on ulcer and skin care Treatment Activities: Patient referred to home care : 07/31/2016 Skin care regimen initiated : 07/31/2016 Topical wound management initiated : 07/31/2016 Notes: Electronic Signature(s) Signed: 08/13/2016 4:41:51 PM By: Gretta Cool, RN, BSN, Kim RN, BSN Entered By: Gretta Cool, RN, BSN, Kim on 08/13/2016 09:22:57 Maurice Delgado ([id number]) -------------------------------------------------------------------------------- Pain Assessment Details Patient Name: Maurice Delgado Date of Service: 08/13/2016 8:15 AM Medical Record Number: [id number] Patient Account Number: [id number] Date of Birth/Sex: 02-24-1934 (83 y.o. Male) Treating RN: Cornell Barman Primary Care Derald Lorge: Gala Romney Other Clinician: Referring Keagon Glascoe: Gala Romney Treating Cortavious Nix/Extender: Ricard Dillon Weeks in Treatment: 1 Active Problems Location of Pain Severity and Description of Pain Patient Has Paino No Site Locations With Dressing Change: No Pain Management and Medication Current Pain Management: Electronic Signature(s) Signed: 08/13/2016 4:41:51 PM By: Gretta Cool, RN, BSN, Kim RN, BSN Entered By: Gretta Cool, RN, BSN, Kim on 08/13/2016 08:27:41 Maurice Delgado ([id number]) -------------------------------------------------------------------------------- Wound Assessment Details Patient Name: Maurice Delgado Date of Service:  08/13/2016 8:15 AM Medical Record Number: [id number] Patient Account Number: [id number] Date of Birth/Sex: 1933/09/15 (84 y.o. Male) Treating RN: Cornell Barman Primary Care Heidemarie Goodnow: Gala Romney Other Clinician: Referring Solara Goodchild: Gala Romney Treating Jacelynn Hayton/Extender: Ricard Dillon Weeks in Treatment: 1 Wound Status Wound Number: 1 Primary Etiology: Venous Leg Ulcer Wound Location: Right, Anterior Lower Leg Wound Status: Open Wounding Event: Gradually Appeared Date Acquired: 07/01/2016 Weeks Of Treatment: 1 Clustered Wound: No Photos Photo Uploaded By: Gretta Cool, RN, BSN, Kim on 08/13/2016 16:23:17 Wound Measurements Length: (cm) 0.1 Width: (cm) 0.1 Depth: (cm) 0.1 Area: (cm) 0.008 Volume: (cm) 0.001 % Reduction in Area: 99.9% % Reduction in Volume: 99.9% Wound Description Full Thickness Without Exposed Classification: Support Structures Periwound Skin Texture Texture Color No Abnormalities Noted: No No Abnormalities Noted: No Moisture No Abnormalities Noted: No Electronic Signature(s) Signed: 08/13/2016 4:41:51 PM By: Gretta Cool, RN, BSN, Kim RN, BSN Entered By: Gretta Cool, RN, BSN, Kim on 08/13/2016 09:22:48 Maurice Delgado, Maurice Delgado ([id number]) Maurice Delgado ([id number]) -------------------------------------------------------------------------------- Wound Assessment Details Patient Name: Maurice Delgado Date of Service: 08/13/2016 8:15 AM Medical Record Number: [id number] Patient Account Number: [id number] Date of Birth/Sex: Oct 05, 1933 (83 y.o. Male) Treating RN: Cornell Barman Primary Care Ashana Tullo: Gala Romney Other Clinician: Referring Wilbert Hayashi: Gala Romney Treating Donnah Levert/Extender: Ricard Dillon Weeks in Treatment: 1 Wound Status Wound Number: 2 Primary Etiology: Lymphedema Wound Location: Left, Anterior Lower Leg Wound Status: Open Wounding Event: Gradually Appeared Date Acquired: 08/07/2016 Weeks Of Treatment: 0 Clustered Wound:  No Photos Photo Uploaded By: Gretta Cool, RN, BSN, Kim on 08/13/2016 16:23:17 Wound Measurements Length: (cm) 0.1 Width: (cm) 0.1 Depth: (cm) 0.1 Area: (cm) 0.008 Volume: (cm) 0.001 % Reduction in Area: 99% % Reduction in Volume: 98.7% Wound Description Classification: Partial Thickness Periwound Skin Texture Texture Color No Abnormalities Noted: No No Abnormalities Noted: No Moisture No Abnormalities Noted: No Electronic Signature(s) Signed: 08/13/2016 4:41:51 PM By: Gretta Cool, RN, BSN, Kim RN, BSN Entered By: Gretta Cool, RN, BSN, Kim on 08/13/2016 09:22:49 Maurice Delgado ([id number]) -------------------------------------------------------------------------------- Vitals Details Patient Name: Maurice Delgado Date of Service: 08/13/2016 8:15 AM  Medical Record Number: [id number] Patient Account Number: [id number] Date of Birth/Sex: 1933/12/05 (83 y.o. Male) Treating RN: Cornell Barman Primary Care Nicandro Perrault: Gala Romney Other Clinician: Referring Anel Purohit: Gala Romney Treating Kyrstyn Greear/Extender: Tito Dine in Treatment: 1 Vital Signs Time Taken: 08:27 Temperature (F): 97.8 Height (in): 74 Pulse (bpm): 75 Weight (lbs): 296 Respiratory Rate (breaths/min): 18 Body Mass Index (BMI): 38 Blood Pressure (mmHg): 101/65 Reference Range: 80 - 120 mg / dl Notes Patient on 2L of O2 Electronic Signature(s) Signed: 08/13/2016 4:41:51 PM By: Gretta Cool, RN, BSN, Kim RN, BSN Entered By: Gretta Cool, RN, BSN, Kim on 08/13/2016 [id number]

## 2016-08-15 ENCOUNTER — Ambulatory Visit (HOSPITAL_COMMUNITY)
Admission: RE | Admit: 2016-08-15 | Discharge: 2016-08-15 | Disposition: A | Discharge status: Home / Self Care | Payer: Medicare Other | Source: Ambulatory Visit | Attending: Internal Medicine | Admitting: Internal Medicine

## 2016-08-15 DIAGNOSIS — I5022 Chronic systolic (congestive) heart failure: Secondary | ICD-10-CM

## 2016-08-15 LAB — BASIC METABOLIC PANEL
ANION GAP: 9 (ref 5–15)
BUN: 44 mg/dL — ABNORMAL HIGH (ref 6–20)
CALCIUM: 9.7 mg/dL (ref 8.9–10.3)
CHLORIDE: 99 mmol/L — AB (ref 101–111)
CO2: 33 mmol/L — AB (ref 22–32)
Creatinine, Ser: 1.94 mg/dL — ABNORMAL HIGH (ref 0.61–1.24)
GFR calc non Af Amer: 31 mL/min — ABNORMAL LOW (ref >60)
GFR, EST AFRICAN AMERICAN: 36 mL/min — AB (ref >60)
Glucose, Bld: 116 mg/dL — ABNORMAL HIGH (ref 65–99)
Potassium: 3.9 mmol/L (ref 3.5–5.1)
SODIUM: 141 mmol/L (ref 135–145)

## 2016-08-19 ENCOUNTER — Encounter (HOSPITAL_COMMUNITY): Payer: Medicare Other

## 2016-08-20 ENCOUNTER — Encounter: Payer: Medicare Other | Admitting: Internal Medicine

## 2016-08-20 DIAGNOSIS — Z86718 Personal history of other venous thrombosis and embolism: Secondary | ICD-10-CM | POA: Diagnosis not present

## 2016-08-20 DIAGNOSIS — Z950 Presence of cardiac pacemaker: Secondary | ICD-10-CM | POA: Diagnosis not present

## 2016-08-20 DIAGNOSIS — J449 Chronic obstructive pulmonary disease, unspecified: Secondary | ICD-10-CM | POA: Diagnosis not present

## 2016-08-20 DIAGNOSIS — I87331 Chronic venous hypertension (idiopathic) with ulcer and inflammation of right lower extremity: Secondary | ICD-10-CM | POA: Diagnosis not present

## 2016-08-20 DIAGNOSIS — I890 Lymphedema, not elsewhere classified: Secondary | ICD-10-CM | POA: Diagnosis not present

## 2016-08-20 DIAGNOSIS — I872 Venous insufficiency (chronic) (peripheral): Secondary | ICD-10-CM | POA: Diagnosis not present

## 2016-08-20 DIAGNOSIS — L97211 Non-pressure chronic ulcer of right calf limited to breakdown of skin: Secondary | ICD-10-CM | POA: Diagnosis not present

## 2016-08-20 DIAGNOSIS — S81801A Unspecified open wound, right lower leg, initial encounter: Secondary | ICD-10-CM | POA: Diagnosis not present

## 2016-08-20 DIAGNOSIS — S81802A Unspecified open wound, left lower leg, initial encounter: Secondary | ICD-10-CM | POA: Diagnosis not present

## 2016-08-21 NOTE — Progress Notes (Signed)
PHI, OLCZAK ([id number]) Visit Report for 08/20/2016 Chief Complaint Document Details Patient Name: Maurice Delgado, Maurice Delgado Date of Service: 08/20/2016 8:15 AM Medical Record Number: [id number] Patient Account Number: [id number] Date of Birth/Sex: 1933-12-26 (83 y.o. Male) Treating RN: Baruch Gouty, RN, BSN, Velva Harman Primary Care Provider: Gala Romney Other Clinician: Referring Provider: Gala Romney Treating Provider/Extender: Ricard Dillon Weeks in Treatment: 2 Information Obtained from: Patient Chief Complaint 07/31/16; patient is here for review of wound on his right anterior leg this been there for 2 months Electronic Signature(s) Signed: 08/21/2016 9:58:44 AM By: Linton Ham MD Entered By: Linton Ham on 08/20/2016 08:56:47 Maurice Delgado ([id number]) -------------------------------------------------------------------------------- HPI Details Patient Name: Maurice Delgado Date of Service: 08/20/2016 8:15 AM Medical Record Number: [id number] Patient Account Number: [id number] Date of Birth/Sex: 1934/02/06 (83 y.o. Male) Treating RN: Baruch Gouty, RN, BSN, [place] Primary Care Provider: Gala Romney Other Clinician: Referring Provider: Gala Romney Treating Provider/Extender: Tito Dine in Treatment: 2 History of Present Illness HPI Description: 07/31/16; this is an 83 year old man with multiple medical problems including chronic stage 3b congestive heart failure with an EF of 20-25%. He has a pacemaker in place. He also has COPD on chronic oxygen history of atrial fibrillation on chronic anticoagulation secondary to history of DVT with Coumadin. She was noted during his last visit to his cardiologist on 07/23/16 to have a wound on the right lower extremity which was weeping. He was referred here for review of this. Also noted at the time that he has nonpalpable pedal pulses and an arterial Doppler was ordered however I don't see the results of this yet. He tells  me that he has had a wound on his leg for about 2 months. He is not doing anything specific to this but was recently given Santyl ointment which she is applying. It would appear that he has chronic venous insufficiency issues although he is not been using compression at home. 08/07/16; this is a frail man with stage IIIB congestive heart failure with an ejection fraction of 20-25%. He has COPD on chronic oxygen. He came here last week with an open wound on his right anterior leg was weeping edema. Based on assessment I felt he had chronic venous insufficiency with secondary lymphedema. He had already had arterial Dopplers ordered by his consultant cardiologist before he came here although he is not had test done. He had a 3 layer wrap on the right leg which she appears to have tolerated quite well. In the meantime the unwrapped left leg is much bigger with a surface eschar anteriorly. 08/13/16; patient arrives today with the wound on his right anterior leg totally epithelialized. His new area on the left anterior leg from last week is also almost fully epithelialized but still looks somewhat vulnerable to reopening. He has severe bilateral chronic venous insufficiency secondary lymphedema. On top of this he has stage IIIB congestive heart failure with an ejection fraction of 20-25%. He has no real complaints today except the inability to take a shower with wraps on his bilateral legs 08/20/16; the area on his right anterior leg remains fully epithelialized. Still has a small open area on the left lateral leg although this is better and dimensions than last week. He has severe bilateral chronic venous insufficiency, secondary lymphedema. He also has stage IIIB congestive heart failure with an ejection fraction of 20-25% he does not weigh himself. He has is stockings which I think we can put on the right leg although I would still like to wrap the  left leg. Arterial studies wre quite normal Electronic  Signature(s) Signed: 08/21/2016 9:58:44 AM By: Linton Ham MD Entered By: Linton Ham on 08/20/2016 09:02:11 Maurice Delgado ([id number]) -------------------------------------------------------------------------------- Physical Exam Details Patient Name: Maurice Delgado Date of Service: 08/20/2016 8:15 AM Medical Record Number: [id number] Patient Account Number: [id number] Date of Birth/Sex: December 07, 1933 (83 y.o. Male) Treating RN: Baruch Gouty, RN, BSN, Velva Harman Primary Care Provider: Gala Romney Other Clinician: Referring Provider: Gala Romney Treating Provider/Extender: Ricard Dillon Weeks in Treatment: 2 Constitutional Sitting or standing Blood Pressure is within target range for patient.. Pulse regular and within target range for patient.Marland Kitchen Respirations regular, non-labored and within target range.. Temperature is normal and within the target range for the patient.. Patient's appearance is neat and clean. Appears in no acute distress. Well nourished and well developed.. Eyes Conjunctivae clear. No discharge.Marland Kitchen Respiratory Respiratory effort is easy and symmetric bilaterally. Rate is normal at rest and on room air.. Cardiovascular Heart rhythm and rate regular, without murmur or gallop. No elevated JVP no S3. Edema present in both extremities. Left much greater than right some of this is pitting. Lymphatic Nonpalpable popliteal or inguinal area. Notes Wound exam; the area on the right anterior leg is fully closed. The edema control here is much better than the left side as well. He has a small open area about the size of a dime on the left anterior leg. Granulation is healthy. His peripheral pulses are palpable. Electronic Signature(s) Signed: 08/21/2016 9:58:44 AM By: Linton Ham MD Entered By: Linton Ham on 08/20/2016 08:59:45 Maurice Delgado ([id number]) -------------------------------------------------------------------------------- Physician Orders  Details Patient Name: Maurice Delgado Date of Service: 08/20/2016 8:15 AM Medical Record Number: [id number] Patient Account Number: [id number] Date of Birth/Sex: 1933/09/28 (83 y.o. Male) Treating RN: Baruch Gouty, RN, BSN, Velva Harman Primary Care Provider: Gala Romney Other Clinician: Referring Provider: Gala Romney Treating Provider/Extender: Tito Dine in Treatment: 2 Verbal / Phone Orders: No Diagnosis Coding Wound Cleansing Wound #2 Left,Anterior Lower Leg o Cleanse wound with mild soap and water o May shower with protection. o No tub bath. Skin Barriers/Peri-Wound Care Wound #2 Left,Anterior Lower Leg o Moisturizing lotion Primary Wound Dressing Wound #2 Left,Anterior Lower Leg o Aquacel Ag Secondary Dressing Wound #2 Left,Anterior Lower Leg o ABD pad Dressing Change Frequency Wound #2 Left,Anterior Lower Leg o Change dressing every week Follow-up Appointments Wound #2 Left,Anterior Lower Leg o Return Appointment in 1 week. Edema Control Wound #2 Left,Anterior Lower Leg o 3 Layer Compression System - Left Lower Extremity o Patient to wear own compression stockings - Juzo ucer pro to left leg o Elevate legs to the level of the heart and pump ankles as often as possible Additional Orders / Instructions Wound #2 Left,Anterior Lower Leg o Activity as tolerated Maurice Delgado, Maurice Delgado ([id number]) Electronic Signature(s) Signed: 08/20/2016 4:45:11 PM By: Regan Lemming BSN, RN Signed: 08/21/2016 9:58:44 AM By: Linton Ham MD Entered By: Regan Lemming on 08/20/2016 08:52:47 Maurice Delgado ([id number]) -------------------------------------------------------------------------------- Problem List Details Patient Name: Maurice Delgado Date of Service: 08/20/2016 8:15 AM Medical Record Number: [id number] Patient Account Number: [id number] Date of Birth/Sex: 09-14-1933 (83 y.o. 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 I87.331 Chronic venous hypertension (idiopathic) with ulcer and 07/31/2016 Yes inflammation of right lower extremity I89.0 Lymphedema, not elsewhere  classified 07/31/2016 Yes Inactive Problems Resolved Problems Electronic Signature(s) Signed: 08/21/2016 9:58:44 AM By: Linton Ham MD Entered By: Linton Ham on 08/20/2016 08:56:17 Maurice Delgado ([id number]) -------------------------------------------------------------------------------- Progress Note Details Patient Name: Maurice Delgado Date of Service: 08/20/2016 8:15 AM Medical Record Number: [id number] Patient Account Number: [id number] Date of Birth/Sex: Mar 10, 1934 (83 y.o. Male) Treating RN: Baruch Gouty, RN, BSN, Velva Harman Primary Care Provider: Gala Romney Other Clinician: Referring Provider: Gala Romney Treating Provider/Extender: Tito Dine in Treatment: 2 Subjective Chief Complaint Information obtained from Patient 07/31/16; patient is here for review of wound on his right anterior leg this been there for 2 months History of Present Illness (HPI) 07/31/16; this is an 83 year old man with multiple medical problems including chronic stage 3b congestive heart failure with an EF of 20-25%. He has a pacemaker in place. He also has COPD on chronic oxygen history of atrial fibrillation on chronic anticoagulation secondary to history of DVT with Coumadin. She was noted during his last visit to his cardiologist on 07/23/16 to have a wound on the right lower extremity which was weeping. He was referred here for review of this. Also noted at the time that he has nonpalpable pedal pulses and an arterial Doppler was  ordered however I don't see the results of this yet. He tells me that he has had a wound on his leg for about 2 months. He is not doing anything specific to this but was recently given Santyl ointment which she is applying. It would appear that he has chronic venous insufficiency issues although he is not been using compression at home. 08/07/16; this is a frail man with stage IIIB congestive heart failure with an ejection fraction of 20-25%. He has COPD on chronic oxygen. He came here last week with an open wound on his right anterior leg was weeping edema. Based on assessment I felt he had chronic venous insufficiency with secondary lymphedema. He had already had arterial Dopplers ordered by his consultant cardiologist before he came here although he is not had test done. He had a 3 layer wrap on the right leg which she appears to have tolerated quite well. In the meantime the unwrapped left leg is much bigger with a surface eschar anteriorly. 08/13/16; patient arrives today with the wound on his right anterior leg totally epithelialized. His new area on the left anterior leg from last week is also almost fully epithelialized but still looks somewhat vulnerable to reopening. He has severe bilateral chronic venous insufficiency secondary lymphedema. On top of this he has stage IIIB congestive heart failure with an ejection fraction of 20-25%. He has no real complaints today except the inability to take a shower with wraps on his bilateral legs 08/20/16; the area on his right anterior leg remains fully epithelialized. Still has a small open area on the left lateral leg although this is better and dimensions than last week. He has severe bilateral chronic venous insufficiency, secondary lymphedema. He also has stage IIIB congestive heart failure with an ejection fraction of 20-25% he does not weigh himself. He has is stockings which I think we can put on the right leg although I would still like to  wrap the left leg. Arterial studies wre quite normal Objective Maurice Delgado, Maurice Delgado ([id number]) Constitutional Sitting or standing Blood Pressure is within target range for patient.. Pulse regular and within target range for patient.Marland Kitchen Respirations regular, non-labored and within target range.. Temperature is normal and within the target range for the patient.. Patient's appearance is neat  and clean. Appears in no acute distress. Well nourished and well developed.. Vitals Time Taken: 8:25 AM, Height: 74 in, Weight: 296 lbs, BMI: 38, Temperature: 97.6 F, Pulse: 73 bpm, Respiratory Rate: 18 breaths/min, Blood Pressure: 113/65 mmHg. Eyes Conjunctivae clear. No discharge.Marland Kitchen Respiratory Respiratory effort is easy and symmetric bilaterally. Rate is normal at rest and on room air.. Cardiovascular Heart rhythm and rate regular, without murmur or gallop. No elevated JVP no S3. Edema present in both extremities. Left much greater than right some of this is pitting. Lymphatic Nonpalpable popliteal or inguinal area. General Notes: Wound exam; the area on the right anterior leg is fully closed. The edema control here is much better than the left side as well. He has a small open area about the size of a dime on the left anterior leg. Granulation is healthy. His peripheral pulses are palpable. Integumentary (Hair, Skin) Wound #1 status is Open. Original cause of wound was Gradually Appeared. The wound is located on the Right,Anterior Lower Leg. The wound measures 0cm length x 0cm width x 0cm depth; 0cm^2 area and 0cm^3 volume. The wound is limited to skin breakdown. There is no tunneling or undermining noted. There is a none present amount of drainage noted. The wound margin is distinct with the outline attached to the wound base. There is no granulation within the wound bed. There is no necrotic tissue within the wound bed. The periwound skin appearance did not exhibit: Callus, Crepitus, Excoriation,  Induration, Rash, Scarring, Dry/Scaly, Maceration, Atrophie Blanche, Cyanosis, Ecchymosis, Hemosiderin Staining, Mottled, Pallor, Rubor, Erythema. Periwound temperature was noted as No Abnormality. Wound #2 status is Open. Original cause of wound was Gradually Appeared. The wound is located on the Left,Anterior Lower Leg. The wound measures 0.5cm length x 0.5cm width x 0.1cm depth; 0.196cm^2 area and 0.02cm^3 volume. The wound is limited to skin breakdown. There is no tunneling or undermining noted. There is a small amount of serosanguineous drainage noted. The wound margin is distinct with the outline attached to the wound base. There is large (67-100%) pink, friable granulation within the wound bed. There is no necrotic tissue within the wound bed. The periwound skin appearance did not exhibit: Callus, Crepitus, Excoriation, Induration, Rash, Scarring, Dry/Scaly, Maceration, Atrophie Blanche, Cyanosis, Ecchymosis, Hemosiderin Staining, Mottled, Pallor, Rubor, Erythema. Periwound temperature was noted as No Abnormality. Maurice Delgado, Maurice Delgado ([id number]) Assessment Active Problems ICD-10 [phone] - Non-pressure chronic ulcer of right calf limited to breakdown of skin I87.331 - Chronic venous hypertension (idiopathic) with ulcer and inflammation of right lower extremity I89.0 - Lymphedema, not elsewhere classified Plan Wound Cleansing: Wound #2 Left,Anterior Lower Leg: Cleanse wound with mild soap and water May shower with protection. No tub bath. Skin Barriers/Peri-Wound Care: Wound #2 Left,Anterior Lower Leg: Moisturizing lotion Primary Wound Dressing: Wound #2 Left,Anterior Lower Leg: Aquacel Ag Secondary Dressing: Wound #2 Left,Anterior Lower Leg: ABD pad Dressing Change Frequency: Wound #2 Left,Anterior Lower Leg: Change dressing every week Follow-up Appointments: Wound #2 Left,Anterior Lower Leg: Return Appointment in 1 week. Edema Control: Wound #2 Left,Anterior Lower  Leg: 3 Layer Compression System - Left Lower Extremity Patient to wear own compression stockings - Juzo ucer pro to left leg Elevate legs to the level of the heart and pump ankles as often as possible Additional Orders / Instructions: Wound #2 Left,Anterior Lower Leg: Activity as tolerated Maurice Delgado, Maurice Delgado ([id number]) #1 we will continue with silver alginate to the left leg, ABDs. #2 his arterial studies were quite satisfactory, if we need more aggressive compression then  I think we can move to a 4 layer wrap #3 the patient does not follow his congestive heart failure very reliably. He has very significant edema in the left greater than right leg. Some of this is venous insufficiency and some lymphedema but he clearly has pitting edema. I don't see any obvious heart failure at the bedside however Electronic Signature(s) Signed: 08/20/2016 9:03:04 AM By: Linton Ham MD Entered By: Linton Ham on 08/20/2016 09:03:03 Maurice Delgado ([id number]) -------------------------------------------------------------------------------- SuperBill Details Patient Name: Maurice Delgado Date of Service: 08/20/2016 Medical Record Number: [id number] Patient Account Number: [id number] Date of Birth/Sex: 05-26-1934 (83 y.o. Male) Treating RN: Baruch Gouty, RN, BSN, [place] Primary Care Provider: Gala Romney Other Clinician: Referring Provider: Gala Romney Treating Provider/Extender: Ricard Dillon Service Line: Outpatient Weeks in Treatment: 2 Diagnosis Coding ICD-10 Codes Code Description [phone] Non-pressure chronic ulcer of right calf limited to breakdown of skin Chronic venous hypertension (idiopathic) with ulcer and inflammation of right lower I87.331 extremity I89.0 Lymphedema, not elsewhere classified L97.221 Non-pressure chronic ulcer of left calf limited to breakdown of skin Facility Procedures CPT4: Description Modifier Quantity Code [id number] (Facility Use Only) [phone] -  [facility] 1 LEG Physician Procedures CPT4 Code Description: [id number] - WC PHYS LEVEL 3 - EST PT ICD-10 Description Diagnosis I89.0 Lymphedema, not elsewhere classified L97.221 Non-pressure chronic ulcer of left calf limited to Modifier: breakdown of s Quantity: 1 kin Electronic Signature(s) Signed: 08/21/2016 9:58:44 AM By: Linton Ham MD Entered By: Linton Ham on 08/20/2016 09:03:33

## 2016-08-21 NOTE — Progress Notes (Signed)
ARDIAN, NAWAZ ([id number]) Visit Report for 08/20/2016 Arrival Information Details Patient Name: Maurice Delgado, Maurice Delgado Date of Service: 08/20/2016 8:15 AM Medical Record Number: [id number] Patient Account Number: [id number] Date of Birth/Sex: [birth year] (83 y.o. Male) Treating RN: Baruch Gouty, RN, BSN, [place] Primary Care Loneta Tamplin: Gala Romney Other Clinician: Referring Kaisyn Reinhold: Gala Romney Treating Antwione Picotte/Extender: Tito Dine in Treatment: 2 Visit Information History Since Last Visit All ordered tests and consults were completed: No Patient Arrived: Maurice Delgado Added or deleted any medications: No Arrival Time: 08:20 Any new allergies or adverse reactions: No Accompanied By: wife Had a fall or experienced change in No Transfer Assistance: None activities of daily living that may affect Patient Identification Yes risk of falls: Verified: Signs or symptoms of abuse/neglect since last No Secondary Verification Yes visito Process Completed: Has Dressing in Place as Prescribed: Yes Patient Has Alerts: Yes Pain Present Now: No Patient Alerts: Patient on [place] Signature(s) Signed: 08/20/2016 4:45:11 PM By: Regan Lemming BSN, RN Entered By: Regan Lemming on 08/20/2016 08:25:17 Maurice Delgado ([id number]) -------------------------------------------------------------------------------- Encounter Discharge Information Details Patient Name: Maurice Delgado Date of Service: 08/20/2016 8:15 AM Medical Record Number: [id number] Patient Account Number: [id number] Date of Birth/Sex: 07-19-1933 (83 y.o. Male) Treating RN: Baruch Gouty, RN, BSN, [place] Primary Care Katherinne Mofield: Gala Romney Other Clinician: Referring Alaster Asfaw: Gala Romney Treating Brayan Votaw/Extender: Tito Dine in Treatment: 2 Encounter Discharge Information Items Discharge Pain Level: 0 Discharge Condition: Stable Ambulatory Status: Walker Discharge Destination:  Home Transportation: Private Auto Accompanied By: wife Schedule Follow-up Appointment: No Medication Reconciliation completed No and provided to Patient/Care Khaliel Morey: Provided on Clinical Summary of Care: 08/20/2016 Form Type Recipient Paper Patient AI Electronic Signature(s) Signed: 08/20/2016 9:14:34 AM By: Ruthine Dose Entered By: Ruthine Dose on 08/20/2016 09:14:34 Maurice Delgado ([id number]) -------------------------------------------------------------------------------- Lower Extremity Assessment Details Patient Name: Maurice Delgado Date of Service: 08/20/2016 8:15 AM Medical Record Number: [id number] Patient Account Number: [id number] Date of Birth/Sex: 02/24/1934 (83 y.o. Male) Treating RN: Afful, RN, BSN, [place] Primary Care Judyann Casasola: Gala Romney Other Clinician: Referring Oreatha Fabry: Gala Romney Treating Tri Chittick/Extender: Ricard Dillon Weeks in Treatment: 2 Edema Assessment Assessed: [Left: No] [Right: No] Edema: [Left: No] [Right: No] Calf Left: Right: Point of Measurement: 41 cm From Medial Instep 50.5 cm 48.5 cm Ankle Left: Right: Point of Measurement: 10 cm From Medial Instep 29 cm 28 cm Vascular Assessment Claudication: Claudication Assessment [Left:None] [Right:None] Pulses: Dorsalis Pedis Palpable: [Left:Yes] [Right:Yes] Posterior Tibial Extremity colors, hair growth, and conditions: Extremity Color: [Left:Mottled] [Right:Mottled] Hair Growth on Extremity: [Left:No] [Right:No] Temperature of Extremity: [Left:Warm] [Right:Warm] Capillary Refill: [Left:< 3 seconds] [Right:< 3 seconds] Toe Nail Assessment Left: Right: Thick: Yes Yes Discolored: Yes Yes Deformed: No No Improper Length and Hygiene: Yes Yes Electronic Signature(s) Signed: 08/20/2016 4:45:11 PM By: Regan Lemming BSN, RN Entered By: Regan Lemming on 08/20/2016 08:32:26 Maurice Delgado ([id number] Einstein, Charleen Kirks  ([id number]) -------------------------------------------------------------------------------- Multi Wound Chart Details Patient Name: Maurice Delgado Date of Service: 08/20/2016 8:15 AM Medical Record Number: [id number] Patient Account Number: [id number] Date of Birth/Sex: [birth year] (83 y.o. Male) Treating RN: Baruch Gouty, RN, BSN, [place] Primary Care Yazir Koerber: Gala Romney Other Clinician: Referring Espyn Radwan: Gala Romney Treating Allexis Bordenave/Extender: Ricard Dillon Weeks in Treatment: 2 Vital Signs Height(in): 74 Pulse(bpm): 73 Weight(lbs): 296 Blood Pressure 113/65 (mmHg): Body Mass Index(BMI): 38 Temperature(F): 97.6 Respiratory Rate 18 (breaths/min): Photos: [1:No Photos] [2:No Photos] [N/A:N/A] Wound Location: [1:Right Lower Leg - Anterior Left Lower Leg - Anterior] [N/A:N/A] Wounding Event: [1:Gradually Appeared] [2:Gradually Appeared] [N/A:N/A] Primary Etiology: [1:Venous Leg Ulcer] [  2:Lymphedema] [N/A:N/A] Comorbid History: [1:Lymphedema, Chronic Obstructive Pulmonary Disease (COPD), Sleep Disease (COPD), Sleep Apnea, Arrhythmia, Congestive Heart Failure, Congestive Heart Failure, Deep Vein Thrombosis, Deep Vein Thrombosis, Hypertension, Peripheral  Hypertension, Peripheral Arterial Disease, Peripheral Venous Disease, Gout] [2:Lymphedema, Chronic Obstructive Pulmonary Apnea, Arrhythmia, Arterial Disease, Peripheral Venous Disease, Gout] [N/A:N/A] Date Acquired: [1:07/01/2016] [2:08/07/2016] [N/A:N/A] Weeks of Treatment: [1:2] [2:1] [N/A:N/A] Wound Status: [1:Open] [2:Open] [N/A:N/A] Measurements L x W x D 0x0x0 [2:0.5x0.5x0.1] [N/A:N/A] (cm) Area (cm) : [1:0] [2:0.196] [N/A:N/A] Volume (cm) : [1:0] [2:0.02] [N/A:N/A] % Reduction in Area: [1:100.00%] [2:75.00%] [N/A:N/A] % Reduction in Volume: 100.00% [2:74.70%] [N/A:N/A] Classification: [1:Full Thickness Without Exposed Support Structures] [2:Partial Thickness] [N/A:N/A] Exudate Amount: [1:None Present] [2:Small]  [N/A:N/A] Exudate Type: [1:N/A] [2:Serosanguineous] [N/A:N/A] Exudate Color: [1:N/A] [2:red, brown] [N/A:N/A] Wound Margin: [1:Distinct, outline attached Distinct, outline attached] [N/A:N/A] Granulation Amount: None Present (0%) Large (67-100%) N/A Granulation Quality: N/A Pink, Friable N/A Necrotic Amount: None Present (0%) None Present (0%) N/A Exposed Structures: Fascia: No Fascia: No N/A Fat Layer (Subcutaneous Fat Layer (Subcutaneous Tissue) Exposed: No Tissue) Exposed: No Tendon: No Tendon: No Muscle: No Muscle: No Joint: No Joint: No Bone: No Bone: No Limited to Skin Limited to Skin Breakdown Breakdown Epithelialization: Large (67-100%) Medium (34-66%) N/A Periwound Skin Texture: Excoriation: No Excoriation: No N/A Induration: No Induration: No Callus: No Callus: No Crepitus: No Crepitus: No Rash: No Rash: No Scarring: No Scarring: No Periwound Skin Maceration: No Maceration: No N/A Moisture: Dry/Scaly: No Dry/Scaly: No Periwound Skin Color: Atrophie Blanche: No Atrophie Blanche: No N/A Cyanosis: No Cyanosis: No Ecchymosis: No Ecchymosis: No Erythema: No Erythema: No Hemosiderin Staining: No Hemosiderin Staining: No Mottled: No Mottled: No Pallor: No Pallor: No Rubor: No Rubor: No Temperature: No Abnormality No Abnormality N/A Tenderness on No No N/A Palpation: Wound Preparation: Ulcer Cleansing: Other: Ulcer Cleansing: Other: N/A surg scrub and water surg scrub and water Topical Anesthetic Topical Anesthetic Applied: None Applied: None Treatment Notes Electronic Signature(s) Signed: 08/21/2016 9:58:44 AM By: Linton Ham MD Entered By: Linton Ham on 08/20/2016 08:56:25 Maurice Delgado ([id number]) -------------------------------------------------------------------------------- Multi-Disciplinary Care Plan Details Patient Name: Maurice Delgado Date of Service: 08/20/2016 8:15 AM Medical Record Number: [id number] Patient  Account Number: [id number] Date of Birth/Sex: [birth year] (83 y.o. Male) Treating RN: Baruch Gouty, RN, BSN, [place] Primary Care Ngozi Alvidrez: Gala Romney Other Clinician: Referring Bostyn Kunkler: Gala Romney Treating Jayah Balthazar/Extender: Ricard Dillon Weeks in Treatment: 2 Active Inactive ` Abuse / Safety / Falls / Self Care Management Nursing Diagnoses: Impaired home maintenance Impaired physical mobility Knowledge deficit related to: safety; personal, health (wound), emergency Potential for falls Self care deficit: actual or potential Goals: Patient will remain injury free Date Initiated: 07/31/2016 Target Resolution Date: 10/24/2016 Goal Status: Active Patient/caregiver will verbalize understanding of skin care regimen Date Initiated: 07/31/2016 Target Resolution Date: 10/24/2016 Goal Status: Active Patient/caregiver will verbalize/demonstrate measures taken to improve the patient's personal safety Date Initiated: 07/31/2016 Target Resolution Date: 10/24/2016 Goal Status: Active Patient/caregiver will verbalize/demonstrate measures taken to prevent injury and/or falls Date Initiated: 07/31/2016 Target Resolution Date: 10/24/2016 Goal Status: Active Interventions: Assess fall risk on admission and as needed Assess: immobility, friction, shearing, incontinence upon admission and as needed Assess impairment of mobility on admission and as needed per policy Assess self care needs on admission and as needed Provide education on basic hygiene Provide education on personal and home safety Provide education on safe transfers Treatment Activities: Education provided on Basic Hygiene : 07/31/2016 Maurice Delgado, Maurice Delgado ([id number]) Notes: ` Orientation to the Wound Care Program Nursing Diagnoses:  Knowledge deficit related to the wound healing center program Goals: Patient/caregiver will verbalize understanding of the [place] Date Initiated: 07/31/2016 Target Resolution Date:  10/24/2016 Goal Status: Active Interventions: Provide education on orientation to the wound center Notes: ` Venous Leg Ulcer Nursing Diagnoses: Knowledge deficit related to disease process and management Potential for venous Insuffiency (use before diagnosis confirmed) Goals: Non-invasive venous studies are completed as ordered Date Initiated: 07/31/2016 Target Resolution Date: 10/24/2016 Goal Status: Active Patient will maintain optimal edema control Date Initiated: 07/31/2016 Target Resolution Date: 10/24/2016 Goal Status: Active Patient/caregiver will verbalize understanding of disease process and disease management Date Initiated: 07/31/2016 Target Resolution Date: 10/24/2016 Goal Status: Active Verify adequate tissue perfusion prior to therapeutic compression application Date Initiated: 07/31/2016 Target Resolution Date: 10/24/2016 Goal Status: Active Interventions: Assess peripheral edema status every visit. Compression as ordered Provide education on venous insufficiency Treatment Activities: Therapeutic compression applied : 07/31/2016 Maurice Delgado, Maurice Delgado ([id number]) Notes: ` Wound/Skin Impairment Nursing Diagnoses: Impaired tissue integrity Knowledge deficit related to ulceration/compromised skin integrity Goals: Patient/caregiver will verbalize understanding of skin care regimen Date Initiated: 07/31/2016 Target Resolution Date: 10/24/2016 Goal Status: Active Ulcer/skin breakdown will have a volume reduction of 30% by week 4 Date Initiated: 07/31/2016 Target Resolution Date: 10/24/2016 Goal Status: Active Ulcer/skin breakdown will have a volume reduction of 50% by week 8 Date Initiated: 07/31/2016 Target Resolution Date: 10/24/2016 Goal Status: Active Ulcer/skin breakdown will have a volume reduction of 80% by week 12 Date Initiated: 07/31/2016 Target Resolution Date: 10/24/2016 Goal Status: Active Ulcer/skin breakdown will heal within 14 weeks Date Initiated:  07/31/2016 Target Resolution Date: 10/24/2016 Goal Status: Active Interventions: Assess patient/caregiver ability to obtain necessary supplies Assess patient/caregiver ability to perform ulcer/skin care regimen upon admission and as needed Assess ulceration(s) every visit Provide education on ulcer and skin care Treatment Activities: Patient referred to home care : 07/31/2016 Skin care regimen initiated : 07/31/2016 Topical wound management initiated : 07/31/2016 Notes: Electronic Signature(s) Signed: 08/20/2016 4:45:11 PM By: Regan Lemming BSN, RN Entered By: Regan Lemming on 08/20/2016 08:42:10 Maurice Delgado ([id number]) -------------------------------------------------------------------------------- Pain Assessment Details Patient Name: Maurice Delgado Date of Service: 08/20/2016 8:15 AM Medical Record Number: [id number] Patient Account Number: [id number] Date of Birth/Sex: [birth year] (83 y.o. Male) Treating RN: Baruch Gouty, RN, BSN, [place] Primary Care Tieler Cournoyer: Gala Romney Other Clinician: Referring Cheick Suhr: Gala Romney Treating Zaahir Pickney/Extender: Ricard Dillon Weeks in Treatment: 2 Active Problems Location of Pain Severity and Description of Pain Patient Has Paino No Site Locations With Dressing Change: No Pain Management and Medication Current Pain Management: Electronic Signature(s) Signed: 08/20/2016 4:45:11 PM By: Regan Lemming BSN, RN Entered By: Regan Lemming on 08/20/2016 08:25:24 Maurice Delgado ([id number]) -------------------------------------------------------------------------------- Patient/Caregiver Education Details Patient Name: Maurice Delgado Date of Service: 08/20/2016 8:15 AM Medical Record Number: [id number] Patient Account Number: [id number] Date of Birth/Gender: Oct 20, 1933 (83 y.o. Male) Treating RN: Baruch Gouty, RN, BSN, [place] Primary Care Physician: Gala Romney Other Clinician: Referring Physician: Gala Romney Treating Physician/Extender:  Tito Dine in Treatment: 2 Education Assessment Education Provided To: Patient Education Topics Provided Basic Hygiene: Methods: Explain/Verbal Responses: State content correctly Safety: Methods: Explain/Verbal Responses: State content correctly Venous: Methods: Explain/Verbal Responses: State content correctly Welcome To [facility] [place]: Methods: Explain/Verbal Responses: State content correctly Wound/Skin Impairment: Methods: Explain/Verbal Responses: State content correctly Electronic Signature(s) Signed: 08/20/2016 4:45:11 PM By: Regan Lemming BSN, RN Entered By: Regan Lemming on 08/20/2016 09:13:22 Maurice Delgado ([id number]) -------------------------------------------------------------------------------- Wound Assessment Details Patient Name: Maurice Delgado Date of Service: 08/20/2016 8:15 AM  Medical Record Number: [id number] Patient Account Number: [id number] Date of Birth/Sex: 19-Jan-1934 (83 y.o. Male) Treating RN: Afful, RN, BSN, [place] Primary Care Bernhard Koskinen: Gala Romney Other Clinician: Referring Maeci Kalbfleisch: Gala Romney Treating Roe Koffman/Extender: Ricard Dillon Weeks in Treatment: 2 Wound Status Wound Number: 1 Primary Venous Leg Ulcer Etiology: Wound Location: Right Lower Leg - Anterior Wound Open Wounding Event: Gradually Appeared Status: Date Acquired: 07/01/2016 Comorbid Lymphedema, Chronic Obstructive Weeks Of Treatment: 2 History: Pulmonary Disease (COPD), Sleep Clustered Wound: No Apnea, Arrhythmia, Congestive Heart Failure, Deep Vein Thrombosis, Hypertension, Peripheral Arterial Disease, Peripheral Venous Disease, Gout Wound Measurements Length: (cm) 0 % Reduction Width: (cm) 0 % Reduction Depth: (cm) 0 Epithelializ Area: (cm) 0 Tunneling: Volume: (cm) 0 Undermining in Area: 100% in Volume: 100% ation: Large (67-100%) No : No Wound Description Full Thickness Without Exposed Classification: Support  Structures Wound Margin: Distinct, outline attached Exudate None Present Amount: Foul Odor After Cleansing: No Slough/Fibrino No Wound Bed Granulation Amount: None Present (0%) Exposed Structure Necrotic Amount: None Present (0%) Fascia Exposed: No Fat Layer (Subcutaneous Tissue) Exposed: No Tendon Exposed: No Muscle Exposed: No Joint Exposed: No Bone Exposed: No Limited to Skin Breakdown Periwound Skin Texture Texture Color No Abnormalities Noted: No No Abnormalities Noted: No Maurice Delgado, Maurice Delgado ([id number]) Callus: No Atrophie Blanche: No Crepitus: No Cyanosis: No Excoriation: No Ecchymosis: No Induration: No Erythema: No Rash: No Hemosiderin Staining: No Scarring: No Mottled: No Pallor: No Moisture Rubor: No No Abnormalities Noted: No Dry / Scaly: No Temperature / Pain Maceration: No Temperature: No Abnormality Wound Preparation Ulcer Cleansing: Other: surg scrub and water, Topical Anesthetic Applied: None Electronic Signature(s) Signed: 08/20/2016 4:45:11 PM By: Regan Lemming BSN, RN Entered By: Regan Lemming on 08/20/2016 08:40:50 Maurice Delgado ([id number]) -------------------------------------------------------------------------------- Wound Assessment Details Patient Name: Maurice Delgado Date of Service: 08/20/2016 8:15 AM Medical Record Number: [id number] Patient Account Number: [id number] Date of Birth/Sex: [birth year] (83 y.o. Male) Treating RN: Baruch Gouty, RN, BSN, [place] Primary Care Sharyon Peitz: Gala Romney Other Clinician: Referring Murphy Duzan: Gala Romney Treating Brandun Pinn/Extender: Ricard Dillon Weeks in Treatment: 2 Wound Status Wound Number: 2 Primary Lymphedema Etiology: Wound Location: Left Lower Leg - Anterior Wound Open Wounding Event: Gradually Appeared Status: Date Acquired: 08/07/2016 Comorbid Lymphedema, Chronic Obstructive Weeks Of Treatment: 1 History: Pulmonary Disease (COPD), Sleep Clustered Wound: No Apnea, Arrhythmia,  Congestive Heart Failure, Deep Vein Thrombosis, Hypertension, Peripheral Arterial Disease, Peripheral Venous Disease, Gout Wound Measurements Length: (cm) 0.5 Width: (cm) 0.5 Depth: (cm) 0.1 Area: (cm) 0.196 Volume: (cm) 0.02 % Reduction in Area: 75% % Reduction in Volume: 74.7% Epithelialization: Medium (34-66%) Tunneling: No Undermining: No Wound Description Classification: Partial Thickness Wound Margin: Distinct, outline attached Exudate Amount: Small Exudate Type: Serosanguineous Exudate Color: red, brown Foul Odor After Cleansing: No Slough/Fibrino No Wound Bed Granulation Amount: Large (67-100%) Exposed Structure Granulation Quality: Pink, Friable Fascia Exposed: No Necrotic Amount: None Present (0%) Fat Layer (Subcutaneous Tissue) Exposed: No Tendon Exposed: No Muscle Exposed: No Joint Exposed: No Bone Exposed: No Limited to Skin Breakdown Periwound Skin Texture Texture Color No Abnormalities Noted: No No Abnormalities Noted: No Maurice Delgado, Maurice Delgado ([id number]) Callus: No Atrophie Blanche: No Crepitus: No Cyanosis: No Excoriation: No Ecchymosis: No Induration: No Erythema: No Rash: No Hemosiderin Staining: No Scarring: No Mottled: No Pallor: No Moisture Rubor: No No Abnormalities Noted: No Dry / Scaly: No Temperature / Pain Maceration: No Temperature: No Abnormality Wound Preparation Ulcer Cleansing: Other: surg scrub and water, Topical Anesthetic Applied: None Treatment Notes Wound #2 (Left, Anterior Lower Leg) 1. Cleansed  with: Cleanse wound with antibacterial soap and water 3. Peri-wound Care: Moisturizing lotion 4. Dressing Applied: Aquacel Ag 5. Secondary Dressing Applied ABD Pad 7. Secured with 3 Layer Compression System - Left Lower Extremity Notes TCA Electronic Signature(s) Signed: 08/20/2016 4:45:11 PM By: Regan Lemming BSN, RN Entered By: Regan Lemming on 08/20/2016 08:41:42 Maurice Delgado  ([id number]) -------------------------------------------------------------------------------- Vitals Details Patient Name: Maurice Delgado Date of Service: 08/20/2016 8:15 AM Medical Record Number: [id number] Patient Account Number: [id number] Date of Birth/Sex: [birth year] (83 y.o. Male) Treating RN: Afful, RN, BSN, [place] Primary Care Clodagh Odenthal: Gala Romney Other Clinician: Referring Hasina Kreager: Gala Romney Treating Bria Sparr/Extender: Tito Dine in Treatment: 2 Vital Signs Time Taken: 08:25 Temperature (F): 97.6 Height (in): 74 Pulse (bpm): 73 Weight (lbs): 296 Respiratory Rate (breaths/min): 18 Body Mass Index (BMI): 38 Blood Pressure (mmHg): 113/65 Reference Range: 80 - 120 mg / dl Electronic Signature(s) Signed: 08/20/2016 4:45:11 PM By: Regan Lemming BSN, RN Entered By: Regan Lemming on 08/20/2016 08:25:46

## 2016-08-22 ENCOUNTER — Ambulatory Visit (HOSPITAL_COMMUNITY)
Admission: RE | Admit: 2016-08-22 | Discharge: 2016-08-22 | Disposition: A | Discharge status: Home / Self Care | Payer: Medicare Other | Source: Ambulatory Visit | Attending: Internal Medicine | Admitting: Internal Medicine

## 2016-08-22 ENCOUNTER — Other Ambulatory Visit: Payer: Self-pay | Admitting: *Deleted

## 2016-08-22 VITALS — BP 130/90 | HR 90 | Wt 293.2 lb

## 2016-08-22 DIAGNOSIS — I130 Hypertensive heart and chronic kidney disease with heart failure and stage 1 through stage 4 chronic kidney disease, or unspecified chronic kidney disease: Secondary | ICD-10-CM | POA: Diagnosis not present

## 2016-08-22 DIAGNOSIS — I872 Venous insufficiency (chronic) (peripheral): Secondary | ICD-10-CM | POA: Diagnosis not present

## 2016-08-22 DIAGNOSIS — M109 Gout, unspecified: Secondary | ICD-10-CM | POA: Insufficient documentation

## 2016-08-22 DIAGNOSIS — Z79899 Other long term (current) drug therapy: Secondary | ICD-10-CM | POA: Insufficient documentation

## 2016-08-22 DIAGNOSIS — Z9981 Dependence on supplemental oxygen: Secondary | ICD-10-CM | POA: Insufficient documentation

## 2016-08-22 DIAGNOSIS — J449 Chronic obstructive pulmonary disease, unspecified: Secondary | ICD-10-CM | POA: Insufficient documentation

## 2016-08-22 DIAGNOSIS — Z95810 Presence of automatic (implantable) cardiac defibrillator: Secondary | ICD-10-CM | POA: Diagnosis not present

## 2016-08-22 DIAGNOSIS — I429 Cardiomyopathy, unspecified: Secondary | ICD-10-CM | POA: Insufficient documentation

## 2016-08-22 DIAGNOSIS — N183 Chronic kidney disease, stage 3 unspecified: Secondary | ICD-10-CM

## 2016-08-22 DIAGNOSIS — Z87891 Personal history of nicotine dependence: Secondary | ICD-10-CM | POA: Insufficient documentation

## 2016-08-22 DIAGNOSIS — L97919 Non-pressure chronic ulcer of unspecified part of right lower leg with unspecified severity: Secondary | ICD-10-CM | POA: Diagnosis not present

## 2016-08-22 DIAGNOSIS — Z7901 Long term (current) use of anticoagulants: Secondary | ICD-10-CM | POA: Insufficient documentation

## 2016-08-22 DIAGNOSIS — I519 Heart disease, unspecified: Secondary | ICD-10-CM | POA: Diagnosis not present

## 2016-08-22 DIAGNOSIS — I959 Hypotension, unspecified: Secondary | ICD-10-CM | POA: Insufficient documentation

## 2016-08-22 DIAGNOSIS — I890 Lymphedema, not elsewhere classified: Secondary | ICD-10-CM | POA: Insufficient documentation

## 2016-08-22 DIAGNOSIS — Z8249 Family history of ischemic heart disease and other diseases of the circulatory system: Secondary | ICD-10-CM | POA: Diagnosis not present

## 2016-08-22 DIAGNOSIS — Z86718 Personal history of other venous thrombosis and embolism: Secondary | ICD-10-CM | POA: Insufficient documentation

## 2016-08-22 DIAGNOSIS — I480 Paroxysmal atrial fibrillation: Secondary | ICD-10-CM | POA: Insufficient documentation

## 2016-08-22 DIAGNOSIS — Z9889 Other specified postprocedural states: Secondary | ICD-10-CM | POA: Insufficient documentation

## 2016-08-22 DIAGNOSIS — G4733 Obstructive sleep apnea (adult) (pediatric): Secondary | ICD-10-CM | POA: Diagnosis not present

## 2016-08-22 DIAGNOSIS — K219 Gastro-esophageal reflux disease without esophagitis: Secondary | ICD-10-CM | POA: Diagnosis not present

## 2016-08-22 DIAGNOSIS — Z823 Family history of stroke: Secondary | ICD-10-CM | POA: Diagnosis not present

## 2016-08-22 DIAGNOSIS — I5022 Chronic systolic (congestive) heart failure: Secondary | ICD-10-CM | POA: Diagnosis not present

## 2016-08-22 MED ORDER — DIGOXIN 125 MCG PO TABS: 125.0000 ug | ORAL_TABLET | Freq: Every morning | ORAL | 3 refills | Status: DC

## 2016-08-22 MED ORDER — WARFARIN SODIUM 5 MG PO TABS: ORAL_TABLET | ORAL | 1 refills | Status: DC

## 2016-08-22 NOTE — Progress Notes (Signed)
Patient ID: Maurice Delgado, male   DOB: [birth year], 83 y.o.   MRN: [id number]    Advanced Heart Failure Clinic Note   PCP: Dr. Jonelle Sidle HF Cardiology: Dr Aundra Dubin Nephrology: Dr Deterding  83 yo with history of chronic systolic CHF due to NICM, CHB s/p Medtronic CRT-D 2002 with generator change 2011, HTN, history of DVT on chronic anticoag with coumadin, COPD, PAF, CKD stage III and hx of OSA.  He went into atrial fibrillation in 7/17 and had TEE-guided DCCV.  He remains in NSR today.    He presents today for regular follow up.  Feeling good since last visit. Being seen at wound clinic at [facility].  Has UNNA boots and stockings placed.  ABIs done.  Breathing has been 02.  Wears 2 liters 02 continuously.  Says he hasn't been using his inhalers very often. Sleeps in recliner chronically. Legs remain very overloaded.  Hasn't taken meds yet this am. Mostly sits in chair at home. Not very active. Uses walker to get to mail box. Has to stop. No chest pain, lightheadedness or dizziness.    Labs (1/17): K 3.6, creatinine 1.56 Labs 07/20/2015: K 4.5 Creatinine 2.33, digoxin 0.5  Labs (2/17): K 4.3, creatinine 2.24 Labs (3/17) K, 4.4, Creatinine 1.73, Dig 0.5 Lab (10/12/2015): INR 2.4  Labs (10/17/2015): K 4.1 Creatinine 1.77 Labs (01/2016): dig level 0.5 K 4.2 Creatinine 1.44 Labs (10/17): K 3.8, creatinine 1.71  ECG: NSR, BiV pacing, QTc 500 msec  PMH: 1. Sciatica 2. H/o DVT: Recurrent.  Has IVC filter 3. Complete heart block: Medtronic CRT-D device placed 2002.  4. Atrial fibrillation: Paroxysmal. On sotalol to maintain NSR.  - TEE-guided DCCV in 7/17.  5. OSA: No longer uses CPAP.  6. Chronic systolic CHF: Echo (123456) with EF 20-25%, mildly dilated LV, normal RV size and systolic function, mild MR.  Nonischemic cardiomyopathy.  Has MDT CRT-D device.  - Hypotension with low dose Bidil.  - TEE (7/17): EF 30%, diffuse hypokinesis, mildly dilated RV with mildly decreased systolic function.  7. COPD: On  home oxygen.  8. History of retroperitoneal hemorrhage.  9. CKD: Stage III.  10. Gout 11. GERD 12. Atrial tachycardia: s/p ablation 2014.  13. Sleep study negative  Social History   Social History  . Marital status: Married    Spouse name: N/A  . Number of children: N/A  . Years of education: N/A   Occupational History  . reitred     from education   Social History Main Topics  . Smoking status: Former Smoker    Packs/day: 1.50    Years: 30.00    Types: Cigarettes  . Smokeless tobacco: Never Used     Comment: 06/21/2013 "quit smoking ~ 1986"  . Alcohol use No     Comment: 06/21/2013 "quit drinking ~ 1970"  . Drug use: Yes    Types: Marijuana     Comment: 06/21/2013 "stopped using marijuana ~ 1970"  . Sexual activity: Not Currently   Other Topics Concern  . Not on file   Social History Narrative   Married and has 1 son.    Lives in [place].  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   Family History  Problem Relation Age of Onset  . Heart disease Mother     mother also had Rheumatism.  Marland Kitchen Heart attack Neg Hx   . Stroke Neg Hx   . Hypertension Mother    ROS: All systems reviewed and negative except as per HPI.   Current  Outpatient Prescriptions  Medication Sig Dispense Refill  . Ascorbic Acid (VITAMIN C) 1000 MG tablet Take 1,000 mg by mouth every morning.     . budesonide-formoterol (SYMBICORT) 160-4.5 MCG/ACT inhaler Inhale 2 puffs into the lungs 2 (two) times daily. 3 Inhaler 1  . calcitRIOL (ROCALTROL) 0.5 MCG capsule Take 0.5 mcg by mouth every other day.     . carvedilol (COREG) 6.25 MG tablet Take 1 tablet (6.25 mg total) by mouth 2 (two) times daily. 60 tablet 6  . Febuxostat (ULORIC) 80 MG TABS Take 80 mg by mouth every morning.     . levalbuterol (XOPENEX HFA) 45 MCG/ACT inhaler Inhale 2 puffs into the lungs every 6 (six) hours as needed for wheezing.    . pregabalin (LYRICA) 150 MG capsule Take 150 mg by mouth 2 (two) times daily.      . sotalol (BETAPACE) 160  MG tablet Take 1 tablet (160 mg total) by mouth daily. 90 tablet 3  . spironolactone (ALDACTONE) 25 MG tablet Take 0.5 tablets (12.5 mg total) by mouth daily. 15 tablet 3  . Tamsulosin HCl (FLOMAX) 0.4 MG CAPS Take 0.4 mg by mouth every morning.     . tiotropium (SPIRIVA) 18 MCG inhalation capsule Place 1 capsule (18 mcg total) into inhaler and inhale every morning. 90 capsule 1  . torsemide (DEMADEX) 20 MG tablet Take 4 tablets (80 mg) in the morning and 2 tablets (40 mg) in the afternoon 120 tablet 3  . warfarin (COUMADIN) 5 MG tablet Take as directed by coumadin clinic (Patient taking differently: 5-7.5 mg. Take 7.5 mg daily except 5 mg on Tuesday) 150 tablet 0  . digoxin (DIGITEK) 0.125 MG tablet Take 1 tablet (125 mcg total) by mouth every morning. (Patient not taking: Reported on 08/22/2016) 90 tablet 3   No current facility-administered medications for this encounter.    BP 130/90   Pulse 90   Wt 293 lb 3.2 oz (133 kg)   SpO2 (!) 89% Comment: ON 2l  BMI 36.65 kg/m    Wt Readings from Last 3 Encounters:  08/22/16 293 lb 3.2 oz (133 kg)  07/23/16 292 lb 12.8 oz (132.8 kg)  04/11/16 286 lb 12.8 oz (130.1 kg)    General: NAD. Obese. Ambulated into clinic using walker.    Neck: JVP 8-9 cm. no thyromegaly or thyroid nodule.  Lungs: Diminished throughout on 2 liters [place].  CV: Nondisplaced PMI.  Heart regular S1/S2, no S3/S4, no murmur. 1-2+ edema to knees. Wrapped bilaterally. Compression hose on R leg. Unable to palpate pedal pulses.  Abdomen: Obese, NT, ND, no HSM. No bruits or masses. +BS  Skin: Intact without lesions or rashes.  Neurologic: Alert and oriented x 3.  Psych: Normal affect. Extremities: No clubbing or cyanosis. HEENT: Normal.   Assessment/Plan: 1. Atrial fibrillation: Paroxysmal. TEE-guided DCCV in 7/17.  - Continue sotalol. Allergic to amiodarone. Continue warfarin per coumadin clinic.  2. COPD: On home oxygen 2L chronically.  COPD may play a significant role in  his symptoms.  3. Chronic systolic CHF: EF 0000000, nonischemic cardiomyopathy.  MDT CRT-D device.  NYHA class III symptoms.   - Volume status looks OK on exam. Picture complicated by marked lymphedema and chronic venous insufficiency.   - Continue torsemide 80 qam/40 qpm. - Bidil was stopped due to symptomatic low BP.    - Resume digoxin 0.125 mg daily. Check level in 2 weeks with trough.     - No ACEI/ARB for now with elevated creatinine. -  Continue Coreg 6.25 mg bid. Up-titration limited by symptomatic low BP. Has not had meds yet today.  - Continue spironolactone 12.5 mg daily. - Encouraged to start cardiac rehab. Referred at last visit.  4. CKD: Stage III. Creatinine baseline ~1.7.   - Stable on BMET last week. No repeat today.  5. Hx of DVT: On warfarin as above.  6. Right lower leg ulceration:  - Per wound center, chronic venous insufficiency with secondary lymphedema.  - Following at wound care in [place] - TBI 0.94 on right and 0.88 on left - Ankle/Brachial Index 1.18 R 1.30 Left  Continue treatment for leg wounds/lymphedema.  Volume status OK. Continue current medications. Needs to increase activity and lose weight.   Follow up 4 weeks.  Shirley Friar, PA-C  08/22/2016

## 2016-08-22 NOTE — Progress Notes (Signed)
Advanced Heart Failure Medication Review by a Pharmacist  Does the patient  feel that his/her medications are working for him/her?  yes  Has the patient been experiencing any side effects to the medications prescribed?  no  Does the patient measure his/her own blood pressure or blood glucose at home?  no   Does the patient have any problems obtaining medications due to transportation or finances?   no  Understanding of regimen: good Understanding of indications: good Potential of compliance: good Patient understands to avoid NSAIDs. Patient understands to avoid decongestants.  Issues to address at subsequent visits: None   Pharmacist comments: Maurice Delgado is a pleasant 83 yo M presenting with his wife. He reports good compliance with his regimen but does admit to not taking his digoxin for at least a month 2/2 thinking that someone called him to tell him to hold it. No other medication-related questions or concerns for me at this time.   Ruta Hinds. Velva Harman, PharmD, BCPS, CPP Clinical Pharmacist Pager: [phone] Phone: [phone] 08/22/2016 12:08 PM      Time with patient: 10 minutes Preparation and documentation time: 2 minutes Total time: 12 minutes

## 2016-08-22 NOTE — Patient Instructions (Signed)
Please restart digoxin 0.125 mg (1 tablet) ONCE DAILY.   Lab work in 2 weeks. Please do NOT take your digoxin on the day of your labs.   Appointment with Oda Kilts, PA-C in 4 weeks.

## 2016-08-27 ENCOUNTER — Encounter: Payer: Medicare Other | Admitting: Internal Medicine

## 2016-08-27 DIAGNOSIS — I872 Venous insufficiency (chronic) (peripheral): Secondary | ICD-10-CM | POA: Diagnosis not present

## 2016-08-27 DIAGNOSIS — J449 Chronic obstructive pulmonary disease, unspecified: Secondary | ICD-10-CM | POA: Diagnosis not present

## 2016-08-27 DIAGNOSIS — L97211 Non-pressure chronic ulcer of right calf limited to breakdown of skin: Secondary | ICD-10-CM | POA: Diagnosis not present

## 2016-08-27 DIAGNOSIS — Z950 Presence of cardiac pacemaker: Secondary | ICD-10-CM | POA: Diagnosis not present

## 2016-08-27 DIAGNOSIS — S81802A Unspecified open wound, left lower leg, initial encounter: Secondary | ICD-10-CM | POA: Diagnosis not present

## 2016-08-27 DIAGNOSIS — I87331 Chronic venous hypertension (idiopathic) with ulcer and inflammation of right lower extremity: Secondary | ICD-10-CM | POA: Diagnosis not present

## 2016-08-27 DIAGNOSIS — Z86718 Personal history of other venous thrombosis and embolism: Secondary | ICD-10-CM | POA: Diagnosis not present

## 2016-08-27 DIAGNOSIS — I890 Lymphedema, not elsewhere classified: Secondary | ICD-10-CM | POA: Diagnosis not present

## 2016-08-29 ENCOUNTER — Other Ambulatory Visit (HOSPITAL_COMMUNITY): Payer: Self-pay | Admitting: Pharmacist

## 2016-08-29 DIAGNOSIS — I5022 Chronic systolic (congestive) heart failure: Secondary | ICD-10-CM

## 2016-08-29 MED ORDER — TORSEMIDE 20 MG PO TABS: ORAL_TABLET | ORAL | 3 refills | Status: DC

## 2016-08-29 MED ORDER — SPIRONOLACTONE 25 MG PO TABS: 12.5000 mg | ORAL_TABLET | Freq: Every day | ORAL | 3 refills | Status: DC

## 2016-08-29 MED ORDER — CARVEDILOL 6.25 MG PO TABS: 6.2500 mg | ORAL_TABLET | Freq: Two times a day (BID) | ORAL | 3 refills | Status: DC

## 2016-08-29 MED ORDER — DIGOXIN 125 MCG PO TABS: 125.0000 ug | ORAL_TABLET | Freq: Every morning | ORAL | 3 refills | Status: DC

## 2016-08-29 NOTE — Progress Notes (Signed)
Maurice Delgado, Maurice Delgado ([id number]) Visit Report for 08/27/2016 Arrival Information Details Patient Name: Maurice Delgado, Maurice Delgado Date of Service: 08/27/2016 8:15 AM Medical Record Number: [id number] Patient Account Number: [id number] Date of Birth/Sex: 08/01/1933 (83 y.o. Male) Treating RN: Baruch Gouty, RN, BSN, [place] Primary Care Jonesha Tsuchiya: Gala Romney Other Clinician: Referring Hien Perreira: Gala Romney Treating Arnesia Vincelette/Extender: Tito Dine in Treatment: 3 Visit Information History Since Last Visit All ordered tests and consults were completed: No Patient Arrived: Gilford Rile Added or deleted any medications: No Arrival Time: 08:20 Any new allergies or adverse reactions: No Accompanied By: wife Had a fall or experienced change in No Transfer Assistance: None activities of daily living that may affect Patient Identification Yes risk of falls: Verified: Signs or symptoms of abuse/neglect since last No Secondary Verification Yes visito Process Completed: Hospitalized since last visit: No Patient Has Alerts: Yes Has Dressing in Place as Prescribed: Yes Patient Alerts: Patient on Blood Thinner Has Compression in Place as Prescribed: Yes COUMADIN,PACEMAKER Pain Present Now: No Electronic Signature(s) Signed: 08/27/2016 3:48:34 PM By: Regan Lemming BSN, RN Entered By: Regan Lemming on 08/27/2016 08:20:55 Maurice Delgado ([id number]) -------------------------------------------------------------------------------- Encounter Discharge Information Details Patient Name: Maurice Delgado Date of Service: 08/27/2016 8:15 AM Medical Record Number: [id number] Patient Account Number: [id number] Date of Birth/Sex: January 24, 1934 (83 y.o. Male) Treating RN: Baruch Gouty, RN, BSN, [place] Primary Care Laszlo Ellerby: Gala Romney Other Clinician: Referring Nikolai Wilczak: Gala Romney Treating Estil Vallee/Extender: Tito Dine in Treatment: 3 Encounter Discharge Information Items Discharge Pain Level:  0 Discharge Condition: Stable Ambulatory Status: Walker Discharge Destination: Home Transportation: Private Auto Accompanied By: wife Schedule Follow-up Appointment: No Medication Reconciliation completed No and provided to Patient/Care Jettson Crable: Provided on Clinical Summary of Care: 08/27/2016 Form Type Recipient Paper Patient AI Electronic Signature(s) Signed: 08/27/2016 9:16:06 AM By: Regan Lemming BSN, RN Previous Signature: 08/27/2016 9:02:57 AM Version By: Ruthine Dose Entered By: Regan Lemming on 08/27/2016 09:16:06 Maurice Delgado ([id number]) -------------------------------------------------------------------------------- Lower Extremity Assessment Details Patient Name: Maurice Delgado Date of Service: 08/27/2016 8:15 AM Medical Record Number: [id number] Patient Account Number: [id number] Date of Birth/Sex: 07-29-1933 (83 y.o. Male) Treating RN: Baruch Gouty, RN, BSN, [place] Primary Care Kaiser Belluomini: Gala Romney Other Clinician: Referring Estrellita Lasky: Gala Romney Treating Anilah Huck/Extender: Ricard Dillon Weeks in Treatment: 3 Edema Assessment Assessed: [Left: No] [Right: No] E[Left: dema] [Right: :] Calf Left: Right: Point of Measurement: 41 cm From Medial Instep 52 cm cm Ankle Left: Right: Point of Measurement: 10 cm From Medial Instep 31 cm cm Vascular Assessment Claudication: Claudication Assessment [Left:None] Pulses: Dorsalis Pedis Palpable: [Left:Yes] Posterior Tibial Extremity colors, hair growth, and conditions: Extremity Color: [Left:Dusky] Hair Growth on Extremity: [Left:No] Temperature of Extremity: [Left:Warm] Capillary Refill: [Left:< 3 seconds] Toe Nail Assessment Left: Right: Thick: Yes Discolored: Yes Deformed: Yes Improper Length and Hygiene: Yes Electronic Signature(s) Signed: 08/27/2016 3:48:34 PM By: Regan Lemming BSN, RN Entered By: Regan Lemming on 08/27/2016 08:31:49 Maurice Delgado ([id number]) Maurice Delgado, Maurice Delgado  ([id number]) -------------------------------------------------------------------------------- Multi Wound Chart Details Patient Name: Maurice Delgado Date of Service: 08/27/2016 8:15 AM Medical Record Number: [id number] Patient Account Number: [id number] Date of Birth/Sex: 1934-01-12 (83 y.o. Male) Treating RN: Baruch Gouty, RN, BSN, [place] Primary Care Twana Wileman: Gala Romney Other Clinician: Referring Johnatan Baskette: Gala Romney Treating Floree Zuniga/Extender: Ricard Dillon Weeks in Treatment: 3 Vital Signs Height(in): 74 Pulse(bpm): 72 Weight(lbs): 296 Blood Pressure 112/66 (mmHg): Body Mass Index(BMI): 38 Temperature(F): 97.6 Respiratory Rate 16 (breaths/min): Photos: [2:No Photos] [N/A:N/A] Wound Location: [2:Left Lower Leg - Anterior] [N/A:N/A] Wounding Event: [2:Gradually Appeared] [N/A:N/A] Primary Etiology: [2:Lymphedema] [N/A:N/A] Comorbid History: [  2:Lymphedema, Chronic Obstructive Pulmonary Disease (COPD), Sleep Apnea, Arrhythmia, Congestive Heart Failure, Deep Vein Thrombosis, Hypertension, Peripheral Arterial Disease, Peripheral Venous Disease, Gout] [N/A:N/A] Date Acquired: [2:08/07/2016] [N/A:N/A] Weeks of Treatment: [2:2] [N/A:N/A] Wound Status: [2:Open] [N/A:N/A] Measurements L x W x D 1.5x1.3x0.1 [N/A:N/A] (cm) Area (cm) : [2:1.532] [N/A:N/A] Volume (cm) : [2:0.153] [N/A:N/A] % Reduction in Area: [2:-95.20%] [N/A:N/A] % Reduction in Volume: -93.70% [N/A:N/A] Classification: [2:Partial Thickness] [N/A:N/A] Exudate Amount: [2:Small] [N/A:N/A] Exudate Type: [2:Serosanguineous] [N/A:N/A] Exudate Color: [2:red, brown] [N/A:N/A] Wound Margin: [2:Distinct, outline attached] [N/A:N/A] Granulation Amount: [2:Large (67-100%)] [N/A:N/A] Granulation Quality: [2:Pink, Friable] [N/A:N/A] Necrotic Amount: None Present (0%) N/A N/A Exposed Structures: Fascia: No N/A N/A Fat Layer (Subcutaneous Tissue) Exposed: No Tendon: No Muscle: No Joint: No Bone: No Limited to  Skin Breakdown Epithelialization: Medium (34-66%) N/A N/A Periwound Skin Texture: Excoriation: No N/A N/A Induration: No Callus: No Crepitus: No Rash: No Scarring: No Periwound Skin Maceration: No N/A N/A Moisture: Dry/Scaly: No Periwound Skin Color: Atrophie Blanche: No N/A N/A Cyanosis: No Ecchymosis: No Erythema: No Hemosiderin Staining: No Mottled: No Pallor: No Rubor: No Temperature: No Abnormality N/A N/A Tenderness on No N/A N/A Palpation: Wound Preparation: Ulcer Cleansing: Other: N/A N/A surg scrub and water Topical Anesthetic Applied: None Treatment Notes Electronic Signature(s) Signed: 08/28/2016 3:56:26 PM By: Linton Ham MD Entered By: Linton Ham on 08/27/2016 09:10:24 Maurice Delgado ([id number]) -------------------------------------------------------------------------------- Multi-Disciplinary Care Plan Details Patient Name: Maurice Delgado Date of Service: 08/27/2016 8:15 AM Medical Record Number: [id number] Patient Account Number: [id number] Date of Birth/Sex: [birth year] (83 y.o. Male) Treating RN: Baruch Gouty, RN, BSN, [place] Primary Care Lenny Bouchillon: Gala Romney Other Clinician: Referring Anisia Leija: Gala Romney Treating [place]/Extender: Tito Dine in Treatment: 3 Active Inactive ` Abuse / Safety / Falls / Self Care Management Nursing Diagnoses: Impaired home maintenance Impaired physical mobility Knowledge deficit related to: safety; personal, health (wound), emergency Potential for falls Self care deficit: actual or potential Goals: Patient will remain injury free Date Initiated: 07/31/2016 Target Resolution Date: 10/24/2016 Goal Status: Active Patient/caregiver will verbalize understanding of skin care regimen Date Initiated: 07/31/2016 Target Resolution Date: 10/24/2016 Goal Status: Active Patient/caregiver will verbalize/demonstrate measures taken to improve the patient's personal safety Date Initiated:  07/31/2016 Target Resolution Date: 10/24/2016 Goal Status: Active Patient/caregiver will verbalize/demonstrate measures taken to prevent injury and/or falls Date Initiated: 07/31/2016 Target Resolution Date: 10/24/2016 Goal Status: Active Interventions: Assess fall risk on admission and as needed Assess: immobility, friction, shearing, incontinence upon admission and as needed Assess impairment of mobility on admission and as needed per policy Assess self care needs on admission and as needed Provide education on basic hygiene Provide education on personal and home safety Provide education on safe transfers Treatment Activities: Education provided on Basic Hygiene : 08/20/2016 Maurice Delgado, Maurice Delgado ([id number]) Notes: ` Orientation to the Wound Care Program Nursing Diagnoses: Knowledge deficit related to the wound healing center program Goals: Patient/caregiver will verbalize understanding of the [place] Program Date Initiated: 07/31/2016 Target Resolution Date: 10/24/2016 Goal Status: Active Interventions: Provide education on orientation to the wound center Notes: ` Venous Leg Ulcer Nursing Diagnoses: Knowledge deficit related to disease process and management Potential for venous Insuffiency (use before diagnosis confirmed) Goals: Non-invasive venous studies are completed as ordered Date Initiated: 07/31/2016 Target Resolution Date: 10/24/2016 Goal Status: Active Patient will maintain optimal edema control Date Initiated: 07/31/2016 Target Resolution Date: 10/24/2016 Goal Status: Active Patient/caregiver will verbalize understanding of disease process and disease management Date Initiated: 07/31/2016 Target Resolution Date: 10/24/2016 Goal Status: Active Verify  adequate tissue perfusion prior to therapeutic compression application Date Initiated: 07/31/2016 Target Resolution Date: 10/24/2016 Goal Status: Active Interventions: Assess peripheral edema status every  visit. Compression as ordered Provide education on venous insufficiency Treatment Activities: Therapeutic compression applied : 07/31/2016 Maurice Delgado, Maurice Delgado ([id number]) Notes: ` Wound/Skin Impairment Nursing Diagnoses: Impaired tissue integrity Knowledge deficit related to ulceration/compromised skin integrity Goals: Patient/caregiver will verbalize understanding of skin care regimen Date Initiated: 07/31/2016 Target Resolution Date: 10/24/2016 Goal Status: Active Ulcer/skin breakdown will have a volume reduction of 30% by week 4 Date Initiated: 07/31/2016 Target Resolution Date: 10/24/2016 Goal Status: Active Ulcer/skin breakdown will have a volume reduction of 50% by week 8 Date Initiated: 07/31/2016 Target Resolution Date: 10/24/2016 Goal Status: Active Ulcer/skin breakdown will have a volume reduction of 80% by week 12 Date Initiated: 07/31/2016 Target Resolution Date: 10/24/2016 Goal Status: Active Ulcer/skin breakdown will heal within 14 weeks Date Initiated: 07/31/2016 Target Resolution Date: 10/24/2016 Goal Status: Active Interventions: Assess patient/caregiver ability to obtain necessary supplies Assess patient/caregiver ability to perform ulcer/skin care regimen upon admission and as needed Assess ulceration(s) every visit Provide education on ulcer and skin care Treatment Activities: Patient referred to home care : 07/31/2016 Skin care regimen initiated : 07/31/2016 Topical wound management initiated : 07/31/2016 Notes: Electronic Signature(s) Signed: 08/27/2016 3:48:34 PM By: Regan Lemming BSN, RN Entered By: Regan Lemming on 08/27/2016 08:44:56 Maurice Delgado ([id number]) -------------------------------------------------------------------------------- Pain Assessment Details Patient Name: Maurice Delgado Date of Service: 08/27/2016 8:15 AM Medical Record Number: [id number] Patient Account Number: [id number] Date of Birth/Sex: 1934/04/13 (83 y.o. Male) Treating RN:  Baruch Gouty, RN, BSN, [place] Primary Care Isaiah Cianci: Gala Romney Other Clinician: Referring Zackariah Vanderpol: Gala Romney Treating Garan Frappier/Extender: Ricard Dillon Weeks in Treatment: 3 Active Problems Location of Pain Severity and Description of Pain Patient Has Paino No Site Locations With Dressing Change: No Pain Management and Medication Current Pain Management: Electronic Signature(s) Signed: 08/27/2016 3:48:34 PM By: Regan Lemming BSN, RN Entered By: Regan Lemming on 08/27/2016 08:21:02 Maurice Delgado ([id number]) -------------------------------------------------------------------------------- Patient/Caregiver Education Details Patient Name: Maurice Delgado Date of Service: 08/27/2016 8:15 AM Medical Record Number: [id number] Patient Account Number: [id number] Date of Birth/Gender: 1933/12/01 (83 y.o. Male) Treating RN: Baruch Gouty, RN, BSN, [place] Primary Care Physician: Gala Romney Other Clinician: Referring Physician: Gala Romney Treating Physician/Extender: Tito Dine in Treatment: 3 Education Assessment Education Provided To: Patient Education Topics Provided Basic Hygiene: Methods: Explain/Verbal Responses: State content correctly Safety: Methods: Explain/Verbal Responses: State content correctly Venous: Methods: Explain/Verbal Responses: State content correctly Welcome To [facility]: Methods: Explain/Verbal Responses: State content correctly Wound/Skin Impairment: Methods: Explain/Verbal Responses: State content correctly Electronic Signature(s) Signed: 08/27/2016 3:48:34 PM By: Regan Lemming BSN, RN Entered By: Regan Lemming on 08/27/2016 09:17:38 Maurice Delgado ([id number]) -------------------------------------------------------------------------------- Wound Assessment Details Patient Name: Maurice Delgado Date of Service: 08/27/2016 8:15 AM Medical Record Number: [id number] Patient Account Number: [id number] Date of Birth/Sex:  December 20, 1933 (83 y.o. Male) Treating RN: Baruch Gouty, RN, BSN, [place] Primary Care Cahlil Sattar: Gala Romney Other Clinician: Referring Karren Newland: Gala Romney Treating Zekiah Caruth/Extender: Ricard Dillon Weeks in Treatment: 3 Wound Status Wound Number: 2 Primary Lymphedema Etiology: Wound Location: Left Lower Leg - Anterior Wound Open Wounding Event: Gradually Appeared Status: Date Acquired: 08/07/2016 Comorbid Lymphedema, Chronic Obstructive Weeks Of Treatment: 2 History: Pulmonary Disease (COPD), Sleep Clustered Wound: No Apnea, Arrhythmia, Congestive Heart Failure, Deep Vein Thrombosis, Hypertension, Peripheral Arterial Disease, Peripheral Venous Disease, Gout Photos Photo Uploaded By: Regan Lemming on 08/27/2016 15:45:28 Wound Measurements Length: (cm) 1.5 Width: (cm) 1.3 Depth: (cm)  0.1 Area: (cm) 1.532 Volume: (cm) 0.153 % Reduction in Area: -95.2% % Reduction in Volume: -93.7% Epithelialization: Medium (34-66%) Tunneling: No Undermining: No Wound Description Maurice Delgado, Maurice Delgado ([id number]) Classification: Partial Thickness Foul Odor After Cleansing: No Wound Margin: Distinct, outline attached Slough/Fibrino No Exudate Amount: Small Exudate Type: Serosanguineous Exudate Color: red, brown Wound Bed Granulation Amount: Large (67-100%) Exposed Structure Granulation Quality: Pink, Friable Fascia Exposed: No Necrotic Amount: None Present (0%) Fat Layer (Subcutaneous Tissue) Exposed: No Tendon Exposed: No Muscle Exposed: No Joint Exposed: No Bone Exposed: No Limited to Skin Breakdown Periwound Skin Texture Texture Color No Abnormalities Noted: No No Abnormalities Noted: No Callus: No Atrophie Blanche: No Crepitus: No Cyanosis: No Excoriation: No Ecchymosis: No Induration: No Erythema: No Rash: No Hemosiderin Staining: No Scarring: No Mottled: No Pallor: No Moisture Rubor: No No Abnormalities Noted: No Dry / Scaly: No Temperature / Pain Maceration:  No Temperature: No Abnormality Wound Preparation Ulcer Cleansing: Other: surg scrub and water, Topical Anesthetic Applied: None Treatment Notes Wound #2 (Left, Anterior Lower Leg) 1. Cleansed with: Cleanse wound with antibacterial soap and water 3. Peri-wound Care: Moisturizing lotion 4. Dressing Applied: Prisma Ag 5. Secondary Dressing Applied ABD Pad 7. Secured with 4-Layer Compression System - Left Lower Extremity Maurice Delgado, Maurice Delgado ([id number]) Notes [facility] Electronic Signature(s) Signed: 08/27/2016 3:48:34 PM By: Regan Lemming BSN, RN Entered By: Regan Lemming on 08/27/2016 08:30:56 Maurice Delgado ([id number]) -------------------------------------------------------------------------------- Vitals Details Patient Name: Maurice Delgado Date of Service: 08/27/2016 8:15 AM Medical Record Number: [id number] Patient Account Number: [id number] Date of Birth/Sex: 1934/01/31 (83 y.o. Male) Treating RN: Afful, RN, BSN, [place] Primary Care Jilene Spohr: Gala Romney Other Clinician: Referring Holston Oyama: Gala Romney Treating Ascher Schroepfer/Extender: Tito Dine in Treatment: 3 Vital Signs Time Taken: 08:35 Temperature (F): 97.6 Height (in): 74 Pulse (bpm): 72 Weight (lbs): 296 Respiratory Rate (breaths/min): 16 Body Mass Index (BMI): 38 Blood Pressure (mmHg): 112/66 Reference Range: 80 - 120 mg / dl Electronic Signature(s) Signed: 08/27/2016 3:48:34 PM By: Regan Lemming BSN, RN Entered By: Regan Lemming on 08/27/2016 08:44:46

## 2016-08-29 NOTE — Progress Notes (Addendum)
GERARDUS, Delgado ([id number]) Visit Report for 08/27/2016 Chief Complaint Document Details Patient Name: Maurice Delgado, Maurice Delgado Date of Service: 08/27/2016 8:15 AM Medical Record Number: [id number] Patient Account Number: [id number] Date of Birth/Sex: 1934-02-21 (83 y.o. Male) Treating RN: Baruch Gouty, RN, BSN, [place] Primary Care Provider: Gala Romney Other Clinician: Referring Provider: Gala Romney Treating Provider/Extender: Tito Dine in Treatment: 3 Information Obtained from: Patient Chief Complaint 07/31/16; patient is here for review of wound on his right anterior leg this been there for 2 months Electronic Signature(s) Signed: 08/28/2016 3:56:26 PM By: Linton Ham MD Entered By: Linton Ham on 08/27/2016 09:10:33 Maurice Delgado ([id number]) -------------------------------------------------------------------------------- HPI Details Patient Name: Maurice Delgado Date of Service: 08/27/2016 8:15 AM Medical Record Number: [id number] Patient Account Number: [id number] Date of Birth/Sex: 1933-12-13 (83 y.o. Male) Treating RN: Baruch Gouty, RN, BSN, [place] Primary Care Provider: Gala Romney Other Clinician: Referring Provider: Gala Romney Treating Provider/Extender: Ricard Dillon Weeks in Treatment: 3 History of Present Illness HPI Description: 07/31/16; this is an 83 year old man with multiple medical problems including chronic stage 3b congestive heart failure with an EF of 20-25%. He has a pacemaker in place. He also has COPD on chronic oxygen history of atrial fibrillation on chronic anticoagulation secondary to history of DVT with Coumadin. She was noted during his last visit to his cardiologist on 07/23/16 to have a wound on the right lower extremity which was weeping. He was referred here for review of this. Also noted at the time that he has nonpalpable pedal pulses and an arterial Doppler was ordered however I don't see the results of this yet. He tells  me that he has had a wound on his leg for about 2 months. He is not doing anything specific to this but was recently given Santyl ointment which she is applying. It would appear that he has chronic venous insufficiency issues although he is not been using compression at home. 08/07/16; this is a frail man with stage IIIB congestive heart failure with an ejection fraction of 20-25%. He has COPD on chronic oxygen. He came here last week with an open wound on his right anterior leg was weeping edema. Based on assessment I felt he had chronic venous insufficiency with secondary lymphedema. He had already had arterial Dopplers ordered by his consultant cardiologist before he came here although he is not had test done. He had a 3 layer wrap on the right leg which she appears to have tolerated quite well. In the meantime the unwrapped left leg is much bigger with a surface eschar anteriorly. 08/13/16; patient arrives today with the wound on his right anterior leg totally epithelialized. His new area on the left anterior leg from last week is also almost fully epithelialized but still looks somewhat vulnerable to reopening. He has severe bilateral chronic venous insufficiency secondary lymphedema. On top of this he has stage IIIB congestive heart failure with an ejection fraction of 20-25%. He has no real complaints today except the inability to take a shower with wraps on his bilateral legs 08/20/16; the area on his right anterior leg remains fully epithelialized. Still has a small open area on the left lateral leg although this is better and dimensions than last week. He has severe bilateral chronic venous insufficiency, secondary lymphedema. He also has stage IIIB congestive heart failure with an ejection fraction of 20-25% he does not weigh himself. He has is stockings which I think we can put on the right leg although I would still like to wrap the  left leg. Arterial studies wre quite normal 08/27/16; the  area on his right anterior leg remains closed he is wearing a stocking. He still has the small open area on his left anterior lateral leg which is relatively unchanged from last week. He does not have good edema control. He has severe bilateral chronic venous insufficiency secondary lymphedema and also stage IIIB congestive heart failure with an ejection fraction of 20-25% he has a stocking for the left leg although we've been using Profore lites on this. Previous arterial studies were quite normal Electronic Signature(s) Signed: 08/28/2016 3:56:26 PM By: Linton Ham MD Entered By: Linton Ham on 08/27/2016 09:11:37 Maurice Delgado ([id number]) -------------------------------------------------------------------------------- Physical Exam Details Patient Name: Maurice Delgado Date of Service: 08/27/2016 8:15 AM Medical Record Number: [id number] Patient Account Number: [id number] Date of Birth/Sex: 1934/06/04 (83 y.o. Male) Treating RN: Baruch Gouty, RN, BSN, [place] Primary Care Provider: Gala Romney Other Clinician: Referring Provider: Gala Romney Treating Provider/Extender: Ricard Dillon Weeks in Treatment: 3 Constitutional Sitting or standing Blood Pressure is within target range for patient.. Pulse regular and within target range for patient.Marland Kitchen Respirations regular, non-labored and within target range.. Temperature is normal and within the target range for the patient.. Patient's appearance is neat and clean. Appears in no acute distress. Well nourished and well developed.. Eyes Conjunctivae clear. No discharge.Marland Kitchen Respiratory Respiratory effort is easy and symmetric bilaterally. Rate is normal at rest and on room air.. Cardiovascular Pedal pulses are normal on the left. Poor edema control which is pending. Lymphatic None palpable in the popliteal or inguinal area on the left. Psychiatric No evidence of depression, anxiety, or agitation. Calm, cooperative, and  communicative. Appropriate interactions and affect.. Notes Wound exam; fairly benign looking innocuous wound on the left anterior leg. Healthy granulation. He does not have very good edema control around the wound with Profore lites. He has normal arterial studies I think we can increase him to Profore's. Electronic Signature(s) Signed: 08/28/2016 3:56:26 PM By: Linton Ham MD Entered By: Linton Ham on 08/27/2016 09:14:39 Maurice Delgado ([id number]) -------------------------------------------------------------------------------- Physician Orders Details Patient Name: Maurice Delgado Date of Service: 08/27/2016 8:15 AM Medical Record Number: [id number] Patient Account Number: [id number] Date of Birth/Sex: 02/08/1934 (83 y.o. Male) Treating RN: Baruch Gouty, RN, BSN, [place] Primary Care Provider: Gala Romney Other Clinician: Referring Provider: Gala Romney Treating Provider/Extender: Tito Dine in Treatment: 3 Verbal / Phone Orders: No Diagnosis Coding Wound Cleansing Wound #2 Left,Anterior Lower Leg o Cleanse wound with mild soap and water o May shower with protection. o No tub bath. Skin Barriers/Peri-Wound Care Wound #2 Left,Anterior Lower Leg o Moisturizing lotion Primary Wound Dressing Wound #2 Left,Anterior Lower Leg o Prisma Ag Secondary Dressing Wound #2 Left,Anterior Lower Leg o ABD pad Dressing Change Frequency Wound #2 Left,Anterior Lower Leg o Change dressing every week Follow-up Appointments Wound #2 Left,Anterior Lower Leg o Return Appointment in 1 week. Edema Control Wound #2 Left,Anterior Lower Leg o 4-Layer Compression System - Left Lower Extremity o Patient to wear own compression stockings - Juzo ulcer pro to right leg o Elevate legs to the level of the heart and pump ankles as often as possible Additional Orders / Instructions Wound #2 Left,Anterior Lower Leg o Activity as tolerated DICKY, CHACE  ([id number]) Electronic Signature(s) Signed: 08/27/2016 3:48:34 PM By: Regan Lemming BSN, RN Signed: 08/28/2016 3:56:26 PM By: Linton Ham MD Entered By: Regan Lemming on 08/27/2016 09:16:53 Maurice Delgado ([id number]) -------------------------------------------------------------------------------- Problem List Details Patient Name: Maurice Delgado Date of Service: 08/27/2016 8:15 AM Medical  Record Number: [id number] Patient Account Number: [id number] Date of Birth/Sex: 10-Jan-1934 (83 y.o. 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 I87.331 Chronic venous hypertension (idiopathic) with ulcer and 07/31/2016 Yes inflammation of right lower extremity I89.0 Lymphedema, not elsewhere classified 07/31/2016 Yes Inactive Problems Resolved Problems Electronic Signature(s) Signed: 08/28/2016 3:56:26 PM By: Linton Ham MD Entered By: Linton Ham on 08/27/2016 09:10:16 Maurice Delgado ([id number]) -------------------------------------------------------------------------------- Progress Note Details Patient Name: Maurice Delgado Date of Service: 08/27/2016 8:15 AM Medical Record Number: [id number] Patient Account Number: [id number] Date of Birth/Sex: Nov 04, 1933 (83 y.o. Male) Treating RN: Baruch Gouty, RN, BSN, [place] Primary Care Provider: Gala Romney Other Clinician: Referring Provider: Gala Romney Treating Provider/Extender: Ricard Dillon Weeks in Treatment: 3 Subjective Chief Complaint Information obtained from Patient 07/31/16; patient is here for review of wound on his right anterior leg this been there for 2 months History of Present Illness  (HPI) 07/31/16; this is an 83 year old man with multiple medical problems including chronic stage 3b congestive heart failure with an EF of 20-25%. He has a pacemaker in place. He also has COPD on chronic oxygen history of atrial fibrillation on chronic anticoagulation secondary to history of DVT with Coumadin. She was noted during his last visit to his cardiologist on 07/23/16 to have a wound on the right lower extremity which was weeping. He was referred here for review of this. Also noted at the time that he has nonpalpable pedal pulses and an arterial Doppler was ordered however I don't see the results of this yet. He tells me that he has had a wound on his leg for about 2 months. He is not doing anything specific to this but was recently given Santyl ointment which she is applying. It would appear that he has chronic venous insufficiency issues although he is not been using compression at home. 08/07/16; this is a frail man with stage IIIB congestive heart failure with an ejection fraction of 20-25%. He has COPD on chronic oxygen. He came here last week with an open wound on his right anterior leg was weeping edema. Based on assessment I felt he had chronic venous insufficiency with secondary lymphedema. He had already had arterial Dopplers ordered by his consultant cardiologist before he came here although he is not had test done. He had a 3 layer wrap on the right leg which she appears to have tolerated quite well. In the meantime the unwrapped left leg is much bigger with a surface eschar anteriorly. 08/13/16; patient arrives today with the wound on his right anterior leg totally epithelialized. His new area on the left anterior leg from last week is also almost fully epithelialized but still looks somewhat vulnerable to reopening. He has severe bilateral chronic venous insufficiency secondary lymphedema. On top of this he has stage IIIB congestive heart failure with an ejection fraction of  20-25%. He has no real complaints today except the inability to take a shower with wraps on his bilateral legs 08/20/16; the area on his right anterior leg remains fully epithelialized. Still has a small open area on the left lateral leg although this is better and dimensions than last week. He has severe bilateral chronic venous insufficiency, secondary lymphedema. He also has stage IIIB congestive heart failure with an ejection fraction  of 20-25% he does not weigh himself. He has is stockings which I think we can put on the right leg although I would still like to wrap the left leg. Arterial studies wre quite normal 08/27/16; the area on his right anterior leg remains closed he is wearing a stocking. He still has the small open area on his left anterior lateral leg which is relatively unchanged from last week. He does not have good edema control. He has severe bilateral chronic venous insufficiency secondary lymphedema and also stage IIIB congestive heart failure with an ejection fraction of 20-25% he has a stocking for the left leg although we've been using Profore lites on this. Previous arterial studies were quite normal Ciani, Favian ([id number]) Objective Constitutional Sitting or standing Blood Pressure is within target range for patient.. Pulse regular and within target range for patient.Marland Kitchen Respirations regular, non-labored and within target range.. Temperature is normal and within the target range for the patient.. Patient's appearance is neat and clean. Appears in no acute distress. Well nourished and well developed.. Vitals Time Taken: 8:35 AM, Height: 74 in, Weight: 296 lbs, BMI: 38, Temperature: 97.6 F, Pulse: 72 bpm, Respiratory Rate: 16 breaths/min, Blood Pressure: 112/66 mmHg. Eyes Conjunctivae clear. No discharge.Marland Kitchen Respiratory Respiratory effort is easy and symmetric bilaterally. Rate is normal at rest and on room air.. Cardiovascular Pedal pulses are normal on the  left. Poor edema control which is pending. Lymphatic None palpable in the popliteal or inguinal area on the left. Psychiatric No evidence of depression, anxiety, or agitation. Calm, cooperative, and communicative. Appropriate interactions and affect.. General Notes: Wound exam; fairly benign looking innocuous wound on the left anterior leg. Healthy granulation. He does not have very good edema control around the wound with Profore lites. He has normal arterial studies I think we can increase him to Profore's. Integumentary (Hair, Skin) Wound #2 status is Open. Original cause of wound was Gradually Appeared. The wound is located on the Left,Anterior Lower Leg. The wound measures 1.5cm length x 1.3cm width x 0.1cm depth; 1.532cm^2 area and 0.153cm^3 volume. The wound is limited to skin breakdown. There is no tunneling or undermining noted. There is a small amount of serosanguineous drainage noted. The wound margin is distinct with the outline attached to the wound base. There is large (67-100%) pink, friable granulation within the wound bed. There is no necrotic tissue within the wound bed. The periwound skin appearance did not exhibit: Callus, Crepitus, Excoriation, Induration, Rash, Scarring, Dry/Scaly, Maceration, Atrophie Blanche, Cyanosis, Ecchymosis, Hemosiderin Staining, Mottled, Pallor, Rubor, Erythema. Periwound temperature was noted as No Abnormality. KARLA, SUMMERLIN ([id number]) Assessment Active Problems ICD-10 [phone] - Non-pressure chronic ulcer of right calf limited to breakdown of skin I87.331 - Chronic venous hypertension (idiopathic) with ulcer and inflammation of right lower extremity I89.0 - Lymphedema, not elsewhere classified Plan Wound Cleansing: Wound #2 Left,Anterior Lower Leg: Cleanse wound with mild soap and water May shower with protection. No tub bath. Skin Barriers/Peri-Wound Care: Wound #2 Left,Anterior Lower Leg: Moisturizing lotion Primary Wound  Dressing: Wound #2 Left,Anterior Lower Leg: Prisma Ag Secondary Dressing: Wound #2 Left,Anterior Lower Leg: ABD pad Dressing Change Frequency: Wound #2 Left,Anterior Lower Leg: Change dressing every week Follow-up Appointments: Wound #2 Left,Anterior Lower Leg: Return Appointment in 1 week. Edema Control: Wound #2 Left,Anterior Lower Leg: 4-Layer Compression System - Left Lower Extremity Patient to wear own compression stockings - Juzo ulcer pro to right leg Elevate legs to the level of the heart and pump ankles as often as  possible Additional Orders / Instructions: Wound #2 Left,Anterior Lower Leg: Activity as tolerated KEDDRICK, SINNER ([id number]) #1 change the dressing the silver collagen ABDs and increases compression to Profore is from Profore lites I am hopeful to have this healed by next week this looks fairly benign Electronic Signature(s) Signed: 09/02/2016 4:00:45 PM By: Gretta Cool, RN, BSN, Kim RN, BSN Signed: 09/04/2016 8:03:18 AM By: Linton Ham MD Previous Signature: 08/28/2016 3:56:26 PM Version By: Linton Ham MD Entered By: Gretta Cool, RN, BSN, Kim on 09/02/2016 16:00:44 Maurice Delgado ([id number]) -------------------------------------------------------------------------------- SuperBill Details Patient Name: Maurice Delgado Date of Service: 08/27/2016 Medical Record Number: [id number] Patient Account Number: [id number] Date of Birth/Sex: October 11, 1933 (83 y.o. 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

## 2016-08-30 NOTE — Addendum Note (Signed)
Encounter addended by: Shirley Friar, PA-C on: 08/30/2016  8:15 AM<BR>    Actions taken: LOS modified, Follow-up modified

## 2016-09-02 ENCOUNTER — Telehealth (HOSPITAL_COMMUNITY): Payer: Self-pay | Admitting: Internal Medicine

## 2016-09-02 NOTE — Telephone Encounter (Signed)
Verified Medicare A/B and [employer]BS insurance benefits through [employer]port.Marland Kitchen [employer] follows Medicare guidelines....  Reference # [id number] & [phone]... KJ

## 2016-09-03 ENCOUNTER — Encounter: Payer: Medicare Other | Attending: Internal Medicine | Admitting: Internal Medicine

## 2016-09-03 DIAGNOSIS — I87331 Chronic venous hypertension (idiopathic) with ulcer and inflammation of right lower extremity: Secondary | ICD-10-CM | POA: Diagnosis not present

## 2016-09-03 DIAGNOSIS — L97211 Non-pressure chronic ulcer of right calf limited to breakdown of skin: Secondary | ICD-10-CM | POA: Diagnosis not present

## 2016-09-03 DIAGNOSIS — J449 Chronic obstructive pulmonary disease, unspecified: Secondary | ICD-10-CM | POA: Diagnosis not present

## 2016-09-03 DIAGNOSIS — I4891 Unspecified atrial fibrillation: Secondary | ICD-10-CM | POA: Insufficient documentation

## 2016-09-03 DIAGNOSIS — Z7901 Long term (current) use of anticoagulants: Secondary | ICD-10-CM | POA: Insufficient documentation

## 2016-09-03 DIAGNOSIS — I890 Lymphedema, not elsewhere classified: Secondary | ICD-10-CM | POA: Diagnosis not present

## 2016-09-03 DIAGNOSIS — S81802D Unspecified open wound, left lower leg, subsequent encounter: Secondary | ICD-10-CM | POA: Diagnosis not present

## 2016-09-03 DIAGNOSIS — I872 Venous insufficiency (chronic) (peripheral): Secondary | ICD-10-CM | POA: Diagnosis not present

## 2016-09-03 DIAGNOSIS — Z86718 Personal history of other venous thrombosis and embolism: Secondary | ICD-10-CM | POA: Diagnosis not present

## 2016-09-03 DIAGNOSIS — Z950 Presence of cardiac pacemaker: Secondary | ICD-10-CM | POA: Diagnosis not present

## 2016-09-04 NOTE — Progress Notes (Signed)
MAIKOL, GRASSIA ([id number]) Visit Report for 09/03/2016 Chief Complaint Document Details Patient Name: Maurice Delgado, Maurice Delgado Date of Service: 09/03/2016 8:15 AM Medical Record Number: [id number] Patient Account Number: [id number] Date of Birth/Sex: [birth year] (83 y.o. Male) Treating RN: Baruch Gouty, RN, BSN, [place] Primary Care Provider: Gala Romney Other Clinician: Referring Provider: Gala Romney Treating Provider/Extender: Tito Dine in Treatment: 4 Information Obtained from: Patient Chief Complaint 07/31/16; patient is here for review of wound on his right anterior leg this been there for 2 months Electronic Signature(s) Signed: 09/04/2016 8:02:19 AM By: Linton Ham MD Entered By: Linton Ham on 09/03/2016 08:48:33 Maurice Delgado ([id number]) -------------------------------------------------------------------------------- HPI Details Patient Name: Maurice Delgado Date of Service: 09/03/2016 8:15 AM Medical Record Number: [id number] Patient Account Number: [id number] Date of Birth/Sex: 14-Jun-1934 (83 y.o. Male) Treating RN: Baruch Gouty, RN, BSN, [place] Primary Care Provider: Gala Romney Other Clinician: Referring Provider: Gala Romney Treating Provider/Extender: Ricard Dillon Weeks in Treatment: 4 History of Present Illness HPI Description: 07/31/16; this is an 83 year old man with multiple medical problems including chronic stage 3b congestive heart failure with an EF of 20-25%. He has a pacemaker in place. He also has COPD on chronic oxygen history of atrial fibrillation on chronic anticoagulation secondary to history of DVT with Coumadin. She was noted during his last visit to his cardiologist on 07/23/16 to have a wound on the right lower extremity which was weeping. He was referred here for review of this. Also noted at the time that he has nonpalpable pedal pulses and an arterial Doppler was ordered however I don't see the results of this yet. He tells me  that he has had a wound on his leg for about 2 months. He is not doing anything specific to this but was recently given Santyl ointment which she is applying. It would appear that he has chronic venous insufficiency issues although he is not been using compression at home. 08/07/16; this is a frail man with stage IIIB congestive heart failure with an ejection fraction of 20-25%. He has COPD on chronic oxygen. He came here last week with an open wound on his right anterior leg was weeping edema. Based on assessment I felt he had chronic venous insufficiency with secondary lymphedema. He had already had arterial Dopplers ordered by his consultant cardiologist before he came here although he is not had test done. He had a 3 layer wrap on the right leg which she appears to have tolerated quite well. In the meantime the unwrapped left leg is much bigger with a surface eschar anteriorly. 08/13/16; patient arrives today with the wound on his right anterior leg totally epithelialized. His new area on the left anterior leg from last week is also almost fully epithelialized but still looks somewhat vulnerable to reopening. He has severe bilateral chronic venous insufficiency secondary lymphedema. On top of this he has stage IIIB congestive heart failure with an ejection fraction of 20-25%. He has no real complaints today except the inability to take a shower with wraps on his bilateral legs 08/20/16; the area on his right anterior leg remains fully epithelialized. Still has a small open area on the left lateral leg although this is better and dimensions than last week. He has severe bilateral chronic venous insufficiency, secondary lymphedema. He also has stage IIIB congestive heart failure with an ejection fraction of 20-25% he does not weigh himself. He has is stockings which I think we can put on the right leg although I would still like to wrap the  left leg. Arterial studies wre quite normal 08/27/16; the  area on his right anterior leg remains closed he is wearing a stocking. He still has the small open area on his left anterior lateral leg which is relatively unchanged from last week. He does not have good edema control. He has severe bilateral chronic venous insufficiency secondary lymphedema and also stage IIIB congestive heart failure with an ejection fraction of 20-25% he has a stocking for the left leg although we've been using Profore lites on this. Previous arterial studies were quite normal 09/03/16; right lower leg remains closed he has a stocking on. Edema is controlled small area on his left lateral leg is fully epithelialized. His edema control and the left leg is better. The patient has chronic venous insufficiency with secondary lymphedema also stage IIIB congestive heart failure. All of this appears to be doing well. Last week we changed him to a Profore from a Profore light he tolerated this well Electronic Signature(s) Signed: 09/04/2016 8:02:19 AM By: Linton Ham MD [place], Maurice Delgado ([id number]) Entered By: Linton Ham on 09/03/2016 08:49:25 Maurice Delgado ([id number]) -------------------------------------------------------------------------------- Physical Exam Details Patient Name: Maurice Delgado Date of Service: 09/03/2016 8:15 AM Medical Record Number: [id number] Patient Account Number: [id number] Date of Birth/Sex: 1933/11/03 (83 y.o. Male) Treating RN: Baruch Gouty, RN, BSN, [place] Primary Care Provider: Gala Romney Other Clinician: Referring Provider: Gala Romney Treating Provider/Extender: Ricard Dillon Weeks in Treatment: 4 Constitutional Sitting or standing Blood Pressure is within target range for patient.. Pulse regular and within target range for patient.Marland Kitchen Respirations regular, non-labored and within target range.. Temperature is normal and within the target range for the patient.. Patient's appearance is neat and clean. Appears in no acute  distress. Well nourished and well developed.. Eyes Conjunctivae clear. No discharge.Marland Kitchen Respiratory Respiratory effort is easy and symmetric bilaterally. Rate is normal at rest and on room air.. Cardiovascular Pedal pulses on the left are normal. Chronic venous insufficiency with secondary lymphedema the edema is well controlled today. Lymphatic None palpable in the left popliteal or inguinal. Psychiatric No evidence of depression, anxiety, or agitation. Calm, cooperative, and communicative. Appropriate interactions and affect.. Notes Wound exam; fairly benign looking area on the lateral left leg that was open last week is now fully epithelialized. He has nice edema control in both legs, better edema control with the left. Electronic Signature(s) Signed: 09/04/2016 8:02:19 AM By: Linton Ham MD Entered By: Linton Ham on 09/03/2016 08:50:54 Maurice Delgado ([id number]) -------------------------------------------------------------------------------- Physician Orders Details Patient Name: Maurice Delgado Date of Service: 09/03/2016 8:15 AM Medical Record Number: [id number] Patient Account Number: [id number] Date of Birth/Sex: 02/29/1934 (83 y.o. Male) Treating RN: Baruch Gouty, RN, BSN, [place] Primary Care Provider: Gala Romney Other Clinician: Referring Provider: Gala Romney Treating Provider/Extender: Tito Dine in Treatment: 4 Verbal / Phone Orders: No Diagnosis Coding Edema Control o Patient to wear own compression stockings o Elevate legs to the level of the heart and pump ankles as often as possible Discharge From [facility] Services o Discharge from [place] Completed Electronic Signature(s) Signed: 09/03/2016 5:41:40 PM By: Regan Lemming BSN, RN Signed: 09/04/2016 8:02:19 AM By: Linton Ham MD Entered By: Regan Lemming on 09/03/2016 08:47:56 Maurice Delgado  ([id number]) -------------------------------------------------------------------------------- Problem List Details Patient Name: Maurice Delgado Date of Service: 09/03/2016 8:15 AM Medical Record Number: [id number] Patient Account Number: [id number] Date of Birth/Sex: 1933/08/10 (83 y.o. 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 I87.331 Chronic venous hypertension (idiopathic) with ulcer and 07/31/2016 Yes inflammation of right lower extremity I89.0 Lymphedema, not elsewhere classified 07/31/2016 Yes Inactive Problems Resolved Problems Electronic Signature(s) Signed: 09/04/2016 8:02:19 AM By: Linton Ham MD Entered By: Linton Ham on 09/03/2016 08:48:10 Maurice Delgado ([id number]) -------------------------------------------------------------------------------- Progress Note Details Patient Name: Maurice Delgado Date of Service: 09/03/2016 8:15 AM Medical Record Number: [id number] Patient Account Number: [id number] Date of Birth/Sex: 1934/06/29 (83 y.o. Male) Treating RN: Baruch Gouty, RN, BSN, [place] Primary Care Provider: Gala Romney Other Clinician: Referring Provider: Gala Romney Treating Provider/Extender: Ricard Dillon Weeks in Treatment: 4 Subjective Chief Complaint Information obtained from Patient 07/31/16; patient is here for review of wound on his right anterior leg this been there for 2 months History of Present Illness (HPI) 07/31/16; this is an 83 year old man with multiple medical problems including chronic stage 3b congestive heart failure with an EF of 20-25%. He has a pacemaker in place. He also has COPD on chronic oxygen history of  atrial fibrillation on chronic anticoagulation secondary to history of DVT with Coumadin. She was noted during his last visit to his cardiologist on 07/23/16 to have a wound on the right lower extremity which was weeping. He was referred here for review of this. Also noted at the time that he has nonpalpable pedal pulses and an arterial Doppler was ordered however I don't see the results of this yet. He tells me that he has had a wound on his leg for about 2 months. He is not doing anything specific to this but was recently given Santyl ointment which she is applying. It would appear that he has chronic venous insufficiency issues although he is not been using compression at home. 08/07/16; this is a frail man with stage IIIB congestive heart failure with an ejection fraction of 20-25%. He has COPD on chronic oxygen. He came here last week with an open wound on his right anterior leg was weeping edema. Based on assessment I felt he had chronic venous insufficiency with secondary lymphedema. He had already had arterial Dopplers ordered by his consultant cardiologist before he came here although he is not had test done. He had a 3 layer wrap on the right leg which she appears to have tolerated quite well. In the meantime the unwrapped left leg is much bigger with a surface eschar anteriorly. 08/13/16; patient arrives today with the wound on his right anterior leg totally epithelialized. His new area on the left anterior leg from last week is also almost fully epithelialized but still looks somewhat vulnerable to reopening. He has severe bilateral chronic venous insufficiency secondary lymphedema. On top of this he has stage IIIB congestive heart failure with an ejection fraction of 20-25%. He has no real complaints today except the inability to take a shower with wraps on his bilateral legs 08/20/16; the area on his right anterior leg remains fully epithelialized. Still has a small open area on the  left lateral leg although this is better and dimensions than last week. He has severe bilateral chronic venous insufficiency, secondary lymphedema. He also has stage IIIB congestive heart failure with an ejection fraction of 20-25% he does not weigh himself. He has is stockings which I think we can put on the right leg although I would still like to wrap the left leg. Arterial studies wre  quite normal 08/27/16; the area on his right anterior leg remains closed he is wearing a stocking. He still has the small open area on his left anterior lateral leg which is relatively unchanged from last week. He does not have good edema control. He has severe bilateral chronic venous insufficiency secondary lymphedema and also stage IIIB congestive heart failure with an ejection fraction of 20-25% he has a stocking for the left leg although we've been using Profore lites on this. Previous arterial studies were quite normal 09/03/16; right lower leg remains closed he has a stocking on. Edema is controlled small area on his left lateral leg is fully epithelialized. His edema control and the left leg is better. The patient has chronic venous Maurice Delgado, Maurice Delgado ([id number]) insufficiency with secondary lymphedema also stage IIIB congestive heart failure. All of this appears to be doing well. Last week we changed him to a Profore from a Profore light he tolerated this well Objective Constitutional Sitting or standing Blood Pressure is within target range for patient.. Pulse regular and within target range for patient.Marland Kitchen Respirations regular, non-labored and within target range.. Temperature is normal and within the target range for the patient.. Patient's appearance is neat and clean. Appears in no acute distress. Well nourished and well developed.. Vitals Time Taken: 8:28 AM, Height: 74 in, Weight: 296 lbs, BMI: 38, Temperature: 97.8 F, Pulse: 71 bpm, Respiratory Rate: 17 breaths/min, Blood Pressure: 105/65  mmHg. Eyes Conjunctivae clear. No discharge.Marland Kitchen Respiratory Respiratory effort is easy and symmetric bilaterally. Rate is normal at rest and on room air.. Cardiovascular Pedal pulses on the left are normal. Chronic venous insufficiency with secondary lymphedema the edema is well controlled today. Lymphatic None palpable in the left popliteal or inguinal. Psychiatric No evidence of depression, anxiety, or agitation. Calm, cooperative, and communicative. Appropriate interactions and affect.. General Notes: Wound exam; fairly benign looking area on the lateral left leg that was open last week is now fully epithelialized. He has nice edema control in both legs, better edema control with the left. Integumentary (Hair, Skin) Wound #2 status is Healed - Epithelialized. Original cause of wound was Gradually Appeared. The wound is located on the Left,Anterior Lower Leg. The wound measures 0cm length x 0cm width x 0cm depth; 0cm^2 area and 0cm^3 volume. The wound is limited to skin breakdown. There is no tunneling or undermining noted. There is a none present amount of drainage noted. The wound margin is distinct with the outline attached to the wound base. There is large (67-100%) pink, friable granulation within the wound bed. There is no necrotic tissue within the wound bed. The periwound skin appearance exhibited: Dry/Scaly, Hemosiderin Staining, Mottled. The periwound skin appearance did not exhibit: Callus, Crepitus, Excoriation, Induration, Rash, Scarring, Maceration, Atrophie Blanche, Cyanosis, Ecchymosis, Pallor, Maurice Delgado, Maurice ([id number]) Rubor, Erythema. Periwound temperature was noted as No Abnormality. Assessment Active Problems ICD-10 L97.211 - Non-pressure chronic ulcer of right calf limited to breakdown of skin I87.331 - Chronic venous hypertension (idiopathic) with ulcer and inflammation of right lower extremity I89.0 - Lymphedema, not elsewhere classified Plan Edema  Control: Patient to wear own compression stockings Elevate legs to the level of the heart and pump ankles as often as possible Discharge From [facility] Services: Discharge from [place] Completed #1 continued to wear on compression stockings bilaterally #2 I've counseled them on lubricating his legs daily #3 continue to observe and control lower extremity edema which in this man's case is multifactorial is crucial to keeping wound free.  We have discussed this 4 patient can be discharged. 5 At some point I could see this man requiring pumps however he seems stable from a wound point of view Electronic Signature(s) Signed: 09/04/2016 8:02:19 AM By: Linton Ham MD Entered By: Linton Ham on 09/03/2016 08:53:26 Maurice Delgado, Maurice Delgado ([id number]) Maurice Delgado, Maurice Delgado ([id number]) -------------------------------------------------------------------------------- SuperBill Details Patient Name: Maurice Delgado Date of Service: 09/03/2016 Medical Record Number: [id number] Patient Account Number: [id number] Date of Birth/Sex: 10-Oct-1933 (83 y.o. Male) Treating RN: Baruch Gouty, RN, BSN, [place] Primary Care Provider: Gala Romney Other Clinician: Referring Provider: Gala Romney Treating Provider/Extender: Ricard Dillon Service Line: Outpatient Weeks in Treatment: 4 Diagnosis Coding ICD-10 Codes Code Description [id number] Non-pressure chronic ulcer of right calf limited to breakdown of skin Chronic venous hypertension (idiopathic) with ulcer and inflammation of right lower I87.331 extremity I89.0 Lymphedema, not elsewhere classified Facility Procedures CPT4 Code: [id number] Description: 10071 - WOUND CARE VISIT-LEV 2 EST PT Modifier: Quantity: 1 Physician Procedures CPT4: Description Modifier Quantity Code [id number] - WC PHYS LEVEL 3 - EST PT 1 ICD-10 Description Diagnosis L97.211 Non-pressure chronic ulcer of right calf limited to breakdown of skin I87.331 Chronic venous  hypertension (idiopathic) with ulcer and  inflammation of right lower extremity Electronic Signature(s) Signed: 09/03/2016 5:41:40 PM By: Regan Lemming BSN, RN Signed: 09/04/2016 8:02:19 AM By: Linton Ham MD Entered By: Regan Lemming on 09/03/2016 08:58:45

## 2016-09-04 NOTE — Progress Notes (Signed)
DEVANTE, CAPANO ([id number]) Visit Report for 09/03/2016 Arrival Information Details Patient Name: Maurice Delgado, Maurice Delgado Date of Service: 09/03/2016 8:15 AM Medical Record Number: [id number] Patient Account Number: [id number] Date of Birth/Sex: 05/07/1934 (83 y.o. Male) Treating RN: Baruch Gouty, RN, BSN, [place] Har[place] Primary Care Travarius Lange: Gala Romney Other Clinician: Referring Mayah Urquidi: Gala Romney Treating Takeesha Isley/Extender: Tito Dine in Treatment: 4 Visit Information History Since Last Visit All ordered tests and consults were completed: No Patient Arrived: Gilford Rile Added or deleted any medications: No Arrival Time: 08:22 Any new allergies or adverse reactions: No Accompanied By: wife Had a fall or experienced change in No Transfer Assistance: None activities of daily living that may affect Patient Identification Yes risk of falls: Verified: Signs or symptoms of abuse/neglect since last No Secondary Verification Yes visito Process Completed: Hospitalized since last visit: No Patient Has Alerts: Yes Has Dressing in Place as Prescribed: Yes Patient Alerts: Patient on Blood Thinner Has Compression in Place as Prescribed: Yes COUMADIN,PACEMAKER Pain Present Now: No Electronic Signature(s) Signed: 09/03/2016 5:41:40 PM By: Regan Lemming BSN, RN Entered By: Regan Lemming on 09/03/2016 08:23:33 Maurice Delgado ([id number]) -------------------------------------------------------------------------------- Clinic Level of Care Assessment Details Patient Name: Maurice Delgado Date of Service: 09/03/2016 8:15 AM Medical Record Number: [id number] Patient Account Number: [id number] Date of Birth/Sex: July 15, 1933 (83 y.o. Male) Treating RN: Baruch Gouty, RN, BSN, [place] Primary Care Taiten Brawn: Gala Romney Other Clinician: Referring Gailyn Crook: Gala Romney Treating Karyme Mcconathy/Extender: Tito Dine in Treatment: 4 Clinic Level of Care Assessment Items TOOL 4 Quantity Score []  -  Use when only an EandM is performed on FOLLOW-UP visit 0 ASSESSMENTS - Nursing Assessment / Reassessment X - Reassessment of Co-morbidities (includes updates in patient status) 1 10 X - Reassessment of Adherence to Treatment Plan 1 5 ASSESSMENTS - Wound and Skin Assessment / Reassessment X - Simple Wound Assessment / Reassessment - one wound 1 5 []  - Complex Wound Assessment / Reassessment - multiple wounds 0 []  - Dermatologic / Skin Assessment (not related to wound area) 0 ASSESSMENTS - Focused Assessment []  - Circumferential Edema Measurements - multi extremities 0 []  - Nutritional Assessment / Counseling / Intervention 0 X - Lower Extremity Assessment (monofilament, tuning fork, pulses) 1 5 []  - Peripheral Arterial Disease Assessment (using hand held doppler) 0 ASSESSMENTS - Ostomy and/or Continence Assessment and Care []  - Incontinence Assessment and Management 0 []  - Ostomy Care Assessment and Management (repouching, etc.) 0 PROCESS - Coordination of Care X - Simple Patient / Family Education for ongoing care 1 15 []  - Complex (extensive) Patient / Family Education for ongoing care 0 []  - Staff obtains Programmer, systems, Records, Test Results / Process Orders 0 []  - Staff telephones HHA, Nursing Homes / Clarify orders / etc 0 []  - Routine Transfer to another Facility (non-emergent condition) 0 EZEL, VALLONE ([id number]) []  - Routine Hospital Admission (non-emergent condition) 0 []  - New Admissions / Biomedical engineer / Ordering NPWT, Apligraf, etc. 0 []  - Emergency Hospital Admission (emergent condition) 0 []  - Simple Discharge Coordination 0 []  - Complex (extensive) Discharge Coordination 0 PROCESS - Special Needs []  - Pediatric / Minor Patient Management 0 []  - Isolation Patient Management 0 []  - Hearing / Language / Visual special needs 0 []  - Assessment of Community assistance (transportation, D/C planning, etc.) 0 []  - Additional assistance / Altered mentation 0 []  -  Support Surface(s) Assessment (bed, cushion, seat, etc.) 0 INTERVENTIONS - Wound Cleansing / Measurement X - Simple Wound Cleansing - one wound 1 5 []  - Complex Wound  Cleansing - multiple wounds 0 X - Wound Imaging (photographs - any number of wounds) 1 5 []  - Wound Tracing (instead of photographs) 0 []  - Simple Wound Measurement - one wound 0 []  - Complex Wound Measurement - multiple wounds 0 INTERVENTIONS - Wound Dressings []  - Small Wound Dressing one or multiple wounds 0 []  - Medium Wound Dressing one or multiple wounds 0 []  - Large Wound Dressing one or multiple wounds 0 []  - Application of Medications - topical 0 []  - Application of Medications - injection 0 INTERVENTIONS - Miscellaneous []  - External ear exam 0 ANTRONE, WALLA ([id number]) []  - Specimen Collection (cultures, biopsies, blood, body fluids, etc.) 0 []  - Specimen(s) / Culture(s) sent or taken to Lab for analysis 0 []  - Patient Transfer (multiple staff / Harrel Lemon Lift / Similar devices) 0 []  - Simple Staple / Suture removal (25 or less) 0 []  - Complex Staple / Suture removal (26 or more) 0 []  - Hypo / Hyperglycemic Management (close monitor of Blood Glucose) 0 []  - Ankle / Brachial Index (ABI) - do not check if billed separately 0 X - Vital Signs 1 5 Has the patient been seen at the hospital within the last three years: Yes Total Score: 55 Level Of Care: New/Established - Level 2 Electronic Signature(s) Signed: 09/03/2016 5:41:40 PM By: Regan Lemming BSN, RN Entered By: Regan Lemming on 09/03/2016 08:58:00 Maurice Delgado ([id number]) -------------------------------------------------------------------------------- Encounter Discharge Information Details Patient Name: Maurice Delgado Date of Service: 09/03/2016 8:15 AM Medical Record Number: [id number] Patient Account Number: [id number] Date of Birth/Sex: 1934/07/01 (83 y.o. Male) Treating RN: Baruch Gouty, RN, BSN, [place] Primary Care Jalynn Betzold: Gala Romney Other  Clinician: Referring Ladeidra Borys: Gala Romney Treating Namari Breton/Extender: Tito Dine in Treatment: 4 Encounter Discharge Information Items Discharge Pain Level: 0 Discharge Condition: Stable Ambulatory Status: Walker Discharge Destination: Home Transportation: Private Auto Accompanied By: wife Schedule Follow-up Appointment: No Medication Reconciliation completed No and provided to Patient/Care Jennilee Demarco: Provided on Clinical Summary of Care: 09/03/2016 Form Type Recipient Paper Patient AI Electronic Signature(s) Signed: 09/03/2016 4:33:21 PM By: Regan Lemming BSN, RN Previous Signature: 09/03/2016 9:00:26 AM Version By: Ruthine Dose Entered By: Regan Lemming on 09/03/2016 16:33:21 Maurice Delgado ([id number]) -------------------------------------------------------------------------------- Lower Extremity Assessment Details Patient Name: Maurice Delgado Date of Service: 09/03/2016 8:15 AM Medical Record Number: [id number] Patient Account Number: [id number] Date of Birth/Sex: Nov 10, 1933 (83 y.o. Male) Treating RN: Baruch Gouty, RN, BSN, [place] Primary Care Raychell Holcomb: Gala Romney Other Clinician: Referring Luzelena Heeg: Gala Romney Treating Angelia Hazell/Extender: Ricard Dillon Weeks in Treatment: 4 Edema Assessment Assessed: [Left: No] [Right: No] E[Left: dema] [Right: :] Calf Left: Right: Point of Measurement: 41 cm From Medial Instep 49 cm cm Ankle Left: Right: Point of Measurement: 10 cm From Medial Instep 29 cm cm Vascular Assessment Claudication: Claudication Assessment [Left:None] Pulses: Dorsalis Pedis Palpable: [Left:Yes] Posterior Tibial Extremity colors, hair growth, and conditions: Extremity Color: [Left:Dusky] Hair Growth on Extremity: [Left:No] Temperature of Extremity: [Left:Warm] Capillary Refill: [Left:< 3 seconds] Toe Nail Assessment Left: Right: Thick: Yes Discolored: Yes Deformed: No Improper Length and Hygiene: Yes Electronic  Signature(s) Signed: 09/03/2016 5:41:40 PM By: Regan Lemming BSN, RN Entered By: Regan Lemming on 09/03/2016 08:41:00 Maurice Delgado ([id number]) Maurice Delgado ([id number]) -------------------------------------------------------------------------------- Multi Wound Chart Details Patient Name: Maurice Delgado Date of Service: 09/03/2016 8:15 AM Medical Record Number: [id number] Patient Account Number: [id number] Date of Birth/Sex: 08/11/1933 (83 y.o. Male) Treating RN: Baruch Gouty, RN, BSN, [place] Primary Care Yishai Rehfeld: Gala Romney Other Clinician: Referring Paeton Latouche: Gala Romney Treating Anelise Staron/Extender: Dellia Nims  MICHAEL G Weeks in Treatment: 4 Vital Signs Height(in): 74 Pulse(bpm): 71 Weight(lbs): 296 Blood Pressure 105/65 (mmHg): Body Mass Index(BMI): 38 Temperature(F): 97.8 Respiratory Rate 17 (breaths/min): Photos: [2:No Photos] [N/A:N/A] Wound Location: [2:Left Lower Leg - Anterior] [N/A:N/A] Wounding Event: [2:Gradually Appeared] [N/A:N/A] Primary Etiology: [2:Lymphedema] [N/A:N/A] Comorbid History: [2:Lymphedema, Chronic Obstructive Pulmonary Disease (COPD), Sleep Apnea, Arrhythmia, Congestive Heart Failure, Deep Vein Thrombosis, Hypertension, Peripheral Arterial Disease, Peripheral Venous Disease, Gout] [N/A:N/A] Date Acquired: [2:08/07/2016] [N/A:N/A] Weeks of Treatment: [2:3] [N/A:N/A] Wound Status: [2:Healed - Epithelialized] [N/A:N/A] Measurements L x W x D 0x0x0 [N/A:N/A] (cm) Area (cm) : [2:0] [N/A:N/A] Volume (cm) : [2:0] [N/A:N/A] % Reduction in Area: [2:100.00%] [N/A:N/A] % Reduction in Volume: 100.00% [N/A:N/A] Classification: [2:Partial Thickness] [N/A:N/A] Exudate Amount: [2:None Present] [N/A:N/A] Wound Margin: [2:Distinct, outline attached] [N/A:N/A] Granulation Amount: [2:Large (67-100%)] [N/A:N/A] Granulation Quality: [2:Pink, Friable] [N/A:N/A] Necrotic Amount: [2:None Present (0%)] [N/A:N/A] Exposed Structures: [N/A:N/A] Fascia: No Fat  Layer (Subcutaneous Tissue) Exposed: No Tendon: No Muscle: No Joint: No Bone: No Limited to Skin Breakdown Epithelialization: Large (67-100%) N/A N/A Periwound Skin Texture: Excoriation: No N/A N/A Induration: No Callus: No Crepitus: No Rash: No Scarring: No Periwound Skin Dry/Scaly: Yes N/A N/A Moisture: Maceration: No Periwound Skin Color: Hemosiderin Staining: Yes N/A N/A Mottled: Yes Atrophie Blanche: No Cyanosis: No Ecchymosis: No Erythema: No Pallor: No Rubor: No Temperature: No Abnormality N/A N/A Tenderness on No N/A N/A Palpation: Wound Preparation: Ulcer Cleansing: Other: N/A N/A surg scrub and water Topical Anesthetic Applied: None Treatment Notes Electronic Signature(s) Signed: 09/04/2016 8:02:19 AM By: Linton Ham MD Entered By: Linton Ham on 09/03/2016 08:48:20 Maurice Delgado ([id number]) -------------------------------------------------------------------------------- Multi-Disciplinary Care Plan Details Patient Name: Maurice Delgado Date of Service: 09/03/2016 8:15 AM Medical Record Number: [id number] Patient Account Number: [id number] Date of Birth/Sex: 04/02/1934 (83 y.o. Male) Treating RN: Baruch Gouty, RN, BSN, [place] Primary Care Dayan Desa: Gala Romney Other Clinician: Referring Eiko Mcgowen: Gala Romney Treating Roarke Marciano/Extender: Ricard Dillon Weeks in Treatment: 4 Active Inactive Electronic Signature(s) Signed: 09/03/2016 5:41:40 PM By: Regan Lemming BSN, RN Entered By: Regan Lemming on 09/03/2016 08:47:21 Maurice Delgado ([id number]) -------------------------------------------------------------------------------- Pain Assessment Details Patient Name: Maurice Delgado Date of Service: 09/03/2016 8:15 AM Medical Record Number: [id number] Patient Account Number: [id number] Date of Birth/Sex: 05/13/1934 (83 y.o. Male) Treating RN: Baruch Gouty, RN, BSN, [place] Primary Care Nolita Kutter: Gala Romney Other Clinician: Referring Kolt Mcwhirter: Gala Romney Treating Naryah Clenney/Extender: Ricard Dillon Weeks in Treatment: 4 Active Problems Location of Pain Severity and Description of Pain Patient Has Paino No Site Locations With Dressing Change: No Pain Management and Medication Current Pain Management: Electronic Signature(s) Signed: 09/03/2016 5:41:40 PM By: Regan Lemming BSN, RN Entered By: Regan Lemming on 09/03/2016 08:23:46 Maurice Delgado ([id number]) -------------------------------------------------------------------------------- Patient/Caregiver Education Details Patient Name: Maurice Delgado Date of Service: 09/03/2016 8:15 AM Medical Record Number: [id number] Patient Account Number: [id number] Date of Birth/Gender: 08-25-1933 (83 y.o. Male) Treating RN: Baruch Gouty, RN, BSN, [place] Primary Care Physician: Gala Romney Other Clinician: Referring Physician: Gala Romney Treating Physician/Extender: Tito Dine in Treatment: 4 Education Assessment Education Provided To: Patient Education Topics Provided Basic Hygiene: Methods: Explain/Verbal Responses: State content correctly Venous: Handouts: Controlling Swelling with Compression Stockings Methods: Explain/Verbal Responses: State content correctly Electronic Signature(s) Signed: 09/03/2016 5:41:40 PM By: Regan Lemming BSN, RN Entered By: Regan Lemming on 09/03/2016 16:33:54 Maurice Delgado ([id number]) -------------------------------------------------------------------------------- Wound Assessment Details Patient Name: Maurice Delgado Date of Service: 09/03/2016 8:15 AM Medical Record Number: [id number] Patient Account Number: [id number] Date of Birth/Sex: 04-22-1934 (83 y.o. Male) Treating RN: Baruch Gouty, RN,  BSN, Velva Harman Primary Care Lucious Zou: Gala Romney Other Clinician: Referring Merrisa Skorupski: Gala Romney Treating Nikalas Bramel/Extender: Tito Dine in Treatment: 4 Wound Status Wound Number: 2 Primary Lymphedema Etiology: Wound Location:  Left Lower Leg - Anterior Wound Healed - Epithelialized Wounding Event: Gradually Appeared Status: Date Acquired: 08/07/2016 Comorbid Lymphedema, Chronic Obstructive Weeks Of Treatment: 3 History: Pulmonary Disease (COPD), Sleep Clustered Wound: No Apnea, Arrhythmia, Congestive Heart Failure, Deep Vein Thrombosis, Hypertension, Peripheral Arterial Disease, Peripheral Venous Disease, Gout Photos Photo Uploaded By: Regan Lemming on 09/03/2016 16:26:10 Wound Measurements Length: (cm) 0 % Reduction i Width: (cm) 0 % Reduction i Depth: (cm) 0 Epithelializa Area: (cm) 0 Tunneling: Volume: (cm) 0 Undermining: n Area: 100% n Volume: 100% tion: Large (67-100%) No No Wound Description AAYUSH, GELPI ([id number]) Classification: Partial Thickness Foul Odor After Cleansing: No Wound Margin: Distinct, outline attached Slough/Fibrino No Exudate Amount: None Present Wound Bed Granulation Amount: Large (67-100%) Exposed Structure Granulation Quality: Pink, Friable Fascia Exposed: No Necrotic Amount: None Present (0%) Fat Layer (Subcutaneous Tissue) Exposed: No Tendon Exposed: No Muscle Exposed: No Joint Exposed: No Bone Exposed: No Limited to Skin Breakdown Periwound Skin Texture Texture Color No Abnormalities Noted: No No Abnormalities Noted: No Callus: No Atrophie Blanche: No Crepitus: No Cyanosis: No Excoriation: No Ecchymosis: No Induration: No Erythema: No Rash: No Hemosiderin Staining: Yes Scarring: No Mottled: Yes Pallor: No Moisture Rubor: No No Abnormalities Noted: No Dry / Scaly: Yes Temperature / Pain Maceration: No Temperature: No Abnormality Wound Preparation Ulcer Cleansing: Other: surg scrub and water, Topical Anesthetic Applied: None Electronic Signature(s) Signed: 09/03/2016 5:41:40 PM By: Regan Lemming BSN, RN Entered By: Regan Lemming on 09/03/2016 08:46:47 Maurice Delgado  ([id number]) -------------------------------------------------------------------------------- Vitals Details Patient Name: Maurice Delgado Date of Service: 09/03/2016 8:15 AM Medical Record Number: [id number] Patient Account Number: [id number] Date of Birth/Sex: 1933-09-29 (83 y.o. Male) Treating RN: Baruch Gouty, RN, BSN, [place] Primary Care Brighten Orndoff: Gala Romney Other Clinician: Referring Abbagail Scaff: Gala Romney Treating Dynasti Kerman/Extender: Ricard Dillon Weeks in Treatment: 4 Vital Signs Time Taken: 08:28 Temperature (F): 97.8 Height (in): 74 Pulse (bpm): 71 Weight (lbs): 296 Respiratory Rate (breaths/min): 17 Body Mass Index (BMI): 38 Blood Pressure (mmHg): 105/65 Reference Range: 80 - 120 mg / dl Electronic Signature(s) Signed: 09/03/2016 5:41:40 PM By: Regan Lemming BSN, RN Entered By: Regan Lemming on 09/03/2016 08:30:17

## 2016-09-05 ENCOUNTER — Ambulatory Visit (INDEPENDENT_AMBULATORY_CARE_PROVIDER_SITE_OTHER): Payer: Medicare Other | Admitting: *Deleted

## 2016-09-05 DIAGNOSIS — Z5181 Encounter for therapeutic drug level monitoring: Secondary | ICD-10-CM

## 2016-09-05 DIAGNOSIS — I480 Paroxysmal atrial fibrillation: Secondary | ICD-10-CM | POA: Diagnosis not present

## 2016-09-05 LAB — POCT INR: INR: 2.5

## 2016-09-06 ENCOUNTER — Other Ambulatory Visit: Payer: Self-pay | Admitting: *Deleted

## 2016-09-06 MED ORDER — WARFARIN SODIUM 5 MG PO TABS: ORAL_TABLET | ORAL | 0 refills | Status: DC

## 2016-09-10 DIAGNOSIS — N2581 Secondary hyperparathyroidism of renal origin: Secondary | ICD-10-CM | POA: Diagnosis not present

## 2016-09-10 DIAGNOSIS — D631 Anemia in chronic kidney disease: Secondary | ICD-10-CM | POA: Diagnosis not present

## 2016-09-10 DIAGNOSIS — J449 Chronic obstructive pulmonary disease, unspecified: Secondary | ICD-10-CM | POA: Diagnosis not present

## 2016-09-10 DIAGNOSIS — I480 Paroxysmal atrial fibrillation: Secondary | ICD-10-CM | POA: Diagnosis not present

## 2016-09-10 DIAGNOSIS — I129 Hypertensive chronic kidney disease with stage 1 through stage 4 chronic kidney disease, or unspecified chronic kidney disease: Secondary | ICD-10-CM | POA: Diagnosis not present

## 2016-09-10 DIAGNOSIS — N183 Chronic kidney disease, stage 3 (moderate): Secondary | ICD-10-CM | POA: Diagnosis not present

## 2016-09-12 ENCOUNTER — Other Ambulatory Visit (HOSPITAL_COMMUNITY): Payer: Self-pay | Admitting: Pharmacist

## 2016-09-12 DIAGNOSIS — I5022 Chronic systolic (congestive) heart failure: Secondary | ICD-10-CM

## 2016-09-12 MED ORDER — SOTALOL HCL 160 MG PO TABS: 160.0000 mg | ORAL_TABLET | Freq: Every day | ORAL | 3 refills | Status: DC

## 2016-09-12 MED ORDER — SOTALOL HCL 160 MG PO TABS: 160.0000 mg | ORAL_TABLET | Freq: Every day | ORAL | 0 refills | Status: DC

## 2016-09-16 ENCOUNTER — Telehealth (HOSPITAL_COMMUNITY): Payer: Self-pay | Admitting: Pharmacist

## 2016-09-16 MED ORDER — CARVEDILOL 3.125 MG PO TABS: 3.1250 mg | ORAL_TABLET | Freq: Two times a day (BID) | ORAL | 3 refills | Status: DC

## 2016-09-16 NOTE — Telephone Encounter (Signed)
Mr. Borawski called stating that he received a 90 day Rx for carvedilol 6.25 mg BID but that he has still only been taking 3.125 mg BID (should have been changed to 6.25 mg BID in August of 2017). I have advised him to continue the 3.125 mg BID until his appointment this Thursday. He also stated that he never received the sotalol that [facility]. [facility] not [facility] yet so he will call today and I informed him that sometimes the mail order pharmacies have a lag time processing Rx's. I have asked him to call me if either pharmacy states that they did not receive the Rx's and will call them myself.   Ruta Hinds. Velva Harman, PharmD, BCPS, CPP Clinical Pharmacist Pager: [phone] Phone: [phone] 09/16/2016 10:15 AM

## 2016-09-19 ENCOUNTER — Ambulatory Visit (HOSPITAL_COMMUNITY)
Admission: RE | Admit: 2016-09-19 | Discharge: 2016-09-19 | Disposition: A | Discharge status: Home / Self Care | Payer: Medicare Other | Source: Ambulatory Visit | Attending: Internal Medicine | Admitting: Internal Medicine

## 2016-09-19 VITALS — BP 110/70 | HR 93 | Wt 287.4 lb

## 2016-09-19 DIAGNOSIS — K219 Gastro-esophageal reflux disease without esophagitis: Secondary | ICD-10-CM | POA: Insufficient documentation

## 2016-09-19 DIAGNOSIS — I5022 Chronic systolic (congestive) heart failure: Secondary | ICD-10-CM | POA: Insufficient documentation

## 2016-09-19 DIAGNOSIS — Z9981 Dependence on supplemental oxygen: Secondary | ICD-10-CM | POA: Insufficient documentation

## 2016-09-19 DIAGNOSIS — Z9889 Other specified postprocedural states: Secondary | ICD-10-CM | POA: Diagnosis not present

## 2016-09-19 DIAGNOSIS — M109 Gout, unspecified: Secondary | ICD-10-CM | POA: Insufficient documentation

## 2016-09-19 DIAGNOSIS — L97519 Non-pressure chronic ulcer of other part of right foot with unspecified severity: Secondary | ICD-10-CM | POA: Insufficient documentation

## 2016-09-19 DIAGNOSIS — Z87891 Personal history of nicotine dependence: Secondary | ICD-10-CM | POA: Insufficient documentation

## 2016-09-19 DIAGNOSIS — Z86718 Personal history of other venous thrombosis and embolism: Secondary | ICD-10-CM

## 2016-09-19 DIAGNOSIS — I429 Cardiomyopathy, unspecified: Secondary | ICD-10-CM | POA: Diagnosis not present

## 2016-09-19 DIAGNOSIS — I519 Heart disease, unspecified: Secondary | ICD-10-CM | POA: Diagnosis not present

## 2016-09-19 DIAGNOSIS — G4733 Obstructive sleep apnea (adult) (pediatric): Secondary | ICD-10-CM | POA: Insufficient documentation

## 2016-09-19 DIAGNOSIS — I739 Peripheral vascular disease, unspecified: Secondary | ICD-10-CM | POA: Insufficient documentation

## 2016-09-19 DIAGNOSIS — Z79899 Other long term (current) drug therapy: Secondary | ICD-10-CM | POA: Insufficient documentation

## 2016-09-19 DIAGNOSIS — L97509 Non-pressure chronic ulcer of other part of unspecified foot with unspecified severity: Secondary | ICD-10-CM | POA: Diagnosis not present

## 2016-09-19 DIAGNOSIS — IMO0002 Reserved for concepts with insufficient information to code with codable children: Secondary | ICD-10-CM

## 2016-09-19 DIAGNOSIS — Z8249 Family history of ischemic heart disease and other diseases of the circulatory system: Secondary | ICD-10-CM | POA: Insufficient documentation

## 2016-09-19 DIAGNOSIS — Z7901 Long term (current) use of anticoagulants: Secondary | ICD-10-CM | POA: Diagnosis not present

## 2016-09-19 DIAGNOSIS — N183 Chronic kidney disease, stage 3 unspecified: Secondary | ICD-10-CM

## 2016-09-19 DIAGNOSIS — J449 Chronic obstructive pulmonary disease, unspecified: Secondary | ICD-10-CM | POA: Diagnosis not present

## 2016-09-19 DIAGNOSIS — I442 Atrioventricular block, complete: Secondary | ICD-10-CM | POA: Diagnosis not present

## 2016-09-19 DIAGNOSIS — L98499 Non-pressure chronic ulcer of skin of other sites with unspecified severity: Secondary | ICD-10-CM | POA: Diagnosis not present

## 2016-09-19 DIAGNOSIS — Z823 Family history of stroke: Secondary | ICD-10-CM | POA: Diagnosis not present

## 2016-09-19 DIAGNOSIS — I480 Paroxysmal atrial fibrillation: Secondary | ICD-10-CM

## 2016-09-19 DIAGNOSIS — L97909 Non-pressure chronic ulcer of unspecified part of unspecified lower leg with unspecified severity: Secondary | ICD-10-CM | POA: Insufficient documentation

## 2016-09-19 LAB — BASIC METABOLIC PANEL
Anion gap: 9 (ref 5–15)
BUN: 44 mg/dL — ABNORMAL HIGH (ref 6–20)
CHLORIDE: 97 mmol/L — AB (ref 101–111)
CO2: 33 mmol/L — AB (ref 22–32)
Calcium: 9.3 mg/dL (ref 8.9–10.3)
Creatinine, Ser: 2.29 mg/dL — ABNORMAL HIGH (ref 0.61–1.24)
GFR calc Af Amer: 29 mL/min — ABNORMAL LOW (ref >60)
GFR calc non Af Amer: 25 mL/min — ABNORMAL LOW (ref >60)
GLUCOSE: 122 mg/dL — AB (ref 65–99)
POTASSIUM: 3.9 mmol/L (ref 3.5–5.1)
Sodium: 139 mmol/L (ref 135–145)

## 2016-09-19 LAB — CBC
HEMATOCRIT: 39.9 % (ref 39.0–52.0)
HEMOGLOBIN: 12 g/dL — AB (ref 13.0–17.0)
MCH: 26.5 pg (ref 26.0–34.0)
MCHC: 30.1 g/dL (ref 30.0–36.0)
MCV: 88.1 fL (ref 78.0–100.0)
Platelets: 124 10*3/uL — ABNORMAL LOW (ref 150–400)
RBC: 4.53 MIL/uL (ref 4.22–5.81)
RDW: 15 % (ref 11.5–15.5)
WBC: 5.2 10*3/uL (ref 4.0–10.5)

## 2016-09-19 LAB — DIGOXIN LEVEL: Digoxin Level: 0.3 ng/mL — ABNORMAL LOW (ref 0.8–2.0)

## 2016-09-19 MED ORDER — DOXYCYCLINE HYCLATE 100 MG PO TABS: 100.0000 mg | ORAL_TABLET | Freq: Two times a day (BID) | ORAL | 0 refills | Status: DC

## 2016-09-19 NOTE — Patient Instructions (Signed)
Routine lab work today. Will notify you of abnormal results, otherwise no news is good news!  START Doxycycline 100 mg tablet twice daily for 1 week. Take until bottle empty.  Follow up 2-3 months with Dr. Aundra Dubin.  Do the following things EVERYDAY: 1) Weigh yourself in the morning before breakfast. Write it down and keep it in a log. 2) Take your medicines as prescribed 3) Eat low salt foods-Limit salt (sodium) to 2000 mg per day.  4) Stay as active as you can everyday 5) Limit all fluids for the day to less than 2 liters

## 2016-09-19 NOTE — Progress Notes (Signed)
Advanced Heart Failure Medication Review by a Pharmacist  Does the patient  feel that his/her medications are working for him/her?  no  Has the patient been experiencing any side effects to the medications prescribed?  yes - dizziness  Does the patient measure his/her own blood pressure or blood glucose at home?  no   Does the patient have any problems obtaining medications due to transportation or finances?   no  Understanding of regimen: fair Understanding of indications: fair Potential of compliance: good Patient understands to avoid NSAIDs. Patient understands to avoid decongestants.  Issues to address at subsequent visits: None   Pharmacist comments: Maurice Delgado is a 83 yo male presenting with his wife to advanced HF clinic today. He brought in a medication list, but states that the carvedilol dose is wrong on the list. His list states carvedilol 6.25 mg bid, however he states that he is not taking the higher dose. He is confused on the dose exactly. He did not take his morning digoxin or torsemide doses this morning. He endorses good compliance with his medications, and has no other questions or concerns for me today.   Belia Heman, PharmD PGY1 Pharmacy Resident [phone] (Pager) 09/19/2016 3:01 PM  Time with patient: 10 min Preparation and documentation time: 2 min Total time: 12 min

## 2016-09-19 NOTE — Progress Notes (Signed)
Patient ID: Maurice Delgado, male   DOB: 07-Dec-1933, 83 y.o.   MRN: [id number]    Advanced Heart Failure Clinic Note   PCP: Dr. Jonelle Sidle HF Cardiology: Dr Aundra Dubin Nephrology: Dr Deterding  Maurice Delgado is a 83 y.o. male with history of chronic systolic CHF due to NICM, CHB s/p Medtronic CRT-D 2002 with generator change 2011, HTN, history of DVT on chronic anticoag with coumadin, COPD, PAF, CKD stage III and hx of OSA.  He went into atrial fibrillation in 7/17 and had TEE-guided DCCV.  H  Pt presents today for regular follow up. At last visit digoxin resumed. Weight down 6 lbs since last visit. Has been feeling lightheaded recently. Feet have been more painful from his lymphedema. No longer following with wound care center as legs have improved. Sleeps in the recliner most nights for comfort, denies orthopnea. Didn't take torsemide this am.    Feeling good since last visit. Being seen at wound clinic at [facility].  Has UNNA boots and stockings placed.  ABIs done.  Breathing has been 02.  Wears 2 liters 02 continuously.  Says he hasn't been using his inhalers very often. Sleeps in recliner chronically. Legs remain very overloaded.  Hasn't taken meds yet this am. Mostly sits in chair at home. Not very active. Uses walker to get to mail box. Has to stop. No chest pain, lightheadedness or dizziness. States he has had a "sore" on his foot for the past few weeks.     Labs (1/17): K 3.6, creatinine 1.56 Labs 07/20/2015: K 4.5 Creatinine 2.33, digoxin 0.5  Labs (2/17): K 4.3, creatinine 2.24 Labs (3/17) K, 4.4, Creatinine 1.73, Dig 0.5 Lab (10/12/2015): INR 2.4  Labs (10/17/2015): K 4.1 Creatinine 1.77 Labs (01/2016): dig level 0.5 K 4.2 Creatinine 1.44 Labs (10/17): K 3.8, creatinine 1.71  ECG: NSR, BiV pacing, QTc 500 msec  PMH: 1. Sciatica 2. H/o DVT: Recurrent.  Has IVC filter 3. Complete heart block: Medtronic CRT-D device placed 2002.  4. Atrial fibrillation: Paroxysmal. On sotalol to maintain  NSR.  - TEE-guided DCCV in 7/17.  5. OSA: No longer uses CPAP.  6. Chronic systolic CHF: Echo (7/35) with EF 20-25%, mildly dilated LV, normal RV size and systolic function, mild MR.  Nonischemic cardiomyopathy.  Has MDT CRT-D device.  - Hypotension with low dose Bidil.  - TEE (7/17): EF 30%, diffuse hypokinesis, mildly dilated RV with mildly decreased systolic function.  7. COPD: On home oxygen.  8. History of retroperitoneal hemorrhage.  9. CKD: Stage III.  10. Gout 11. GERD 12. Atrial tachycardia: s/p ablation 2014.  13. Sleep study negative  Social History   Social History  . Marital status: Married    Spouse name: N/A  . Number of children: N/A  . Years of education: N/A   Occupational History  . reitred     from education   Social History Main Topics  . Smoking status: Former Smoker    Packs/day: 1.50    Years: 30.00    Types: Cigarettes  . Smokeless tobacco: Never Used     Comment: 06/21/2013 "quit smoking ~ 1986"  . Alcohol use No     Comment: 06/21/2013 "quit drinking ~ 1970"  . Drug use: Yes    Types: Marijuana     Comment: 06/21/2013 "stopped using marijuana ~ 1970"  . Sexual activity: Not Currently   Other Topics Concern  . Not on file   Social History Narrative   Married and has 1 son.  Lives in [place].  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   Family History  Problem Relation Age of Onset  . Heart disease Mother     mother also had Rheumatism.  Marland Kitchen Heart attack Neg Hx   . Stroke Neg Hx   . Hypertension Mother    ROS: All systems reviewed and negative except as per HPI.   Current Outpatient Prescriptions  Medication Sig Dispense Refill  . Ascorbic Acid (VITAMIN C) 1000 MG tablet Take 1,000 mg by mouth every morning.     . budesonide-formoterol (SYMBICORT) 160-4.5 MCG/ACT inhaler Inhale 2 puffs into the lungs 2 (two) times daily. 3 Inhaler 1  . calcitRIOL (ROCALTROL) 0.5 MCG capsule Take 0.5 mcg by mouth every other day.     . carvedilol (COREG) 3.125  MG tablet Take 1 tablet (3.125 mg total) by mouth 2 (two) times daily. 180 tablet 3  . digoxin (DIGITEK) 0.125 MG tablet Take 1 tablet (125 mcg total) by mouth every morning. 90 tablet 3  . Febuxostat (ULORIC) 80 MG TABS Take 80 mg by mouth every morning.     . levalbuterol (XOPENEX HFA) 45 MCG/ACT inhaler Inhale 2 puffs into the lungs every 6 (six) hours as needed for wheezing.    . pregabalin (LYRICA) 150 MG capsule Take 150 mg by mouth 2 (two) times daily.      . sotalol (BETAPACE) 160 MG tablet Take 1 tablet (160 mg total) by mouth daily. 90 tablet 3  . spironolactone (ALDACTONE) 25 MG tablet Take 0.5 tablets (12.5 mg total) by mouth daily. 45 tablet 3  . Tamsulosin HCl (FLOMAX) 0.4 MG CAPS Take 0.4 mg by mouth every morning.     . tiotropium (SPIRIVA) 18 MCG inhalation capsule Place 1 capsule (18 mcg total) into inhaler and inhale every morning. 90 capsule 1  . torsemide (DEMADEX) 20 MG tablet Take 4 tablets (80 mg) in the morning and 2 tablets (40 mg) in the afternoon 540 tablet 3  . warfarin (COUMADIN) 5 MG tablet Take 7.5 mg daily except 5 mg on Tuesday 15 tablet 0   No current facility-administered medications for this encounter.    BP 110/70 (BP Location: Left Arm, Patient Position: Sitting, Cuff Size: Normal)   Pulse 93   Wt 287 lb 6.4 oz (130.4 kg)   SpO2 98%   BMI 35.92 kg/m    Wt Readings from Last 3 Encounters:  09/19/16 287 lb 6.4 oz (130.4 kg)  08/22/16 293 lb 3.2 oz (133 kg)  07/23/16 292 lb 12.8 oz (132.8 kg)    General: Obese, NAD, ambulated into clinic using a walker.     Neck: JVP 7-8 cm. No thyromegaly or nodule noted.   Lungs: Slightly decreased basilar sounds, Normal effort. CV: Nondisplaced PMI.  Heart regular S1/S2, no S3/S4, no murmur.    Abdomen: Obese, soft, NT, ND, no HSM. No bruits or masses. +BS  Skin: Intact without lesions or rashes.  Neurologic: Alert and oriented x 3.  Psych: Normal affect. Extremities: No clubbing or cyanosis.Trace to 1+ edema.  Bilaterally compression hose.  R foot with bandage. Serosanguinous drainage, no obvious purulence, foul smell, or erythema on my exam.  HEENT: Normal.   Assessment/Plan: 1. Atrial fibrillation: Paroxysmal. TEE-guided DCCV in 7/17.  - Continue sotalol. Allergic to amiodarone.  - Continue coumadin per coumadin clinic.   2. COPD:  - Stable on home 02 2 L via [place]. Likely a significant contributor to his dyspnea.   3. Chronic systolic CHF: EF 14-48%,  nonischemic cardiomyopathy.  MDT CRT-D device.   - NYHA class III symptoms.  - Volume status stable on exam.  - Continue torsemide 80 mg q am and 40 mg q pm.    - Has not tolerated Bidil due to symptomatic low BP.  - Continue digoxin 0.125 mg daily. Will check trough level today.      - No ACEI/ARB for now with elevated creatinine. - Pt is unsure coreg 3.125 mg BID or 6.25 mg BID. I have asked him to call us and let us know.  - Continue spironolactone 12.5 mg daily. - Reinforced fluid restriction to < 2 L daily, sodium restriction to less than 2000 mg daily, and the importance of daily weights.   4. CKD: Stage III. Creatinine baseline ~1.7.   - BMET today.   5. Hx of DVT:  - Coumadin as above.   6. PVD   - TBI 0.94 on right and 0.88 on left - Ankle/Brachial Index 1.18 R 1.30 Left - No change to current plan.  7. Right foot ulcer - Poorly healing ulcer R foot after pt picked at his heel several weeks ago.   - With purulence will start on doxycycline 100 mg BID x 7 days.  - Will need to follow up with PCP if not resolved with ABX.   Volume status stable on exam. Meds as above. ABX for foot ulcer. Pt stable from HF perspective.  Follow up 2-3 months.   Shirley Friar, PA-C  09/19/2016   Greater than 50% of the 25 minute visit was spent in counseling/coordination of care regarding consultation with Pharm-D, disease state education, and medication reconciliation.

## 2016-09-20 ENCOUNTER — Telehealth (HOSPITAL_COMMUNITY): Payer: Self-pay | Admitting: Cardiology

## 2016-09-20 DIAGNOSIS — I509 Heart failure, unspecified: Secondary | ICD-10-CM

## 2016-09-20 NOTE — Telephone Encounter (Signed)
-----   Message from Shirley Friar, PA-C sent at 09/20/2016  9:27 AM EDT ----- Relatively stable.   Can we recheck BMET 2 weeks?   Legrand Como [address]" [place], PA-C 09/20/2016 9:27 AM

## 2016-09-24 ENCOUNTER — Ambulatory Visit: Payer: Medicare Other | Admitting: Pulmonary Disease

## 2016-09-27 NOTE — Progress Notes (Deleted)
Electrophysiology Office Note Date: 09/27/2016  ID:  Maurice, Delgado 04/02/1934, MRN [id number]  PCP: Barbette Merino, MD Primary Cardiologist: Tamala Julian Electrophysiologist: Allred  CC: Routine ICD follow-up  Maurice Delgado is a 83 y.o. male seen today for Dr Rayann Heman.  He presents today for routine electrophysiology followup.  Since last being seen in our clinic, the patient reports doing relatively well. He underwent TEE guided DCCV in July of last year.  He continues to be followed at [facility] wound clinic for [facility] boot placement as well as closely with the AHF clinic. He denies chest pain, palpitations, nausea, vomiting, dizziness, syncope, edema, weight gain, or early satiety.  He has not had ICD shocks.   Device History: MDT CRTD implanted 2002 for NICM, CHF; gen change 2011 and 2016 History of appropriate therapy: No History of AAD therapy: Yes - Sotalol for AF   Past Medical History:  Diagnosis Date  . Atrial tachycardia ([place])    a. s/p ablation 05/2013.  Marland Kitchen Chronic kidney disease (CKD), stage III (moderate)   . Chronic respiratory failure ([place])    a. On home O2 - 2-3L 24/7  . Chronic systolic CHF (congestive heart failure) ([place])   . COPD (chronic obstructive pulmonary disease) ([place])   . DVT (deep venous thrombosis) ([place])    a. Recurrent DVTs. b. s/p IVC filter 2007. c. DVTs dx 05/2013 - started back on Coumadin  . GERD (gastroesophageal reflux disease)   . Gout   . Hemoptysis    a. H/o of hemoptysis with pulmonary fibrosis (patient has refused biopsies in the past).  Marland Kitchen HTN (hypertension)   . 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  . Left fibular fracture    a. 03/2013 - nonsurgical mgmt.  . Nonischemic cardiomyopathy ([place])    a. EF 35%. b. s/p CRT-D device implanted - initially 2002 in [place], gen change 2011 (MDT).  . OSA on CPAP   . PAF (paroxysmal atrial fibrillation) ([place])   . Retroperitoneal bleed    a. Remotely on Coumadin (per Dr. Jackalyn Lombard prior office note).     Past Surgical History:  Procedure Laterality Date  . APPENDECTOMY  1940's   "I think so"  . BIV ICD GENERTAOR CHANGE OUT N/A 08/30/2014   Procedure: BIV ICD GENERTAOR CHANGE OUT;  Surgeon: Thompson Grayer, MD;  Location: [facility] CATH LAB;  Service: Cardiovascular;  Laterality: N/A;  . CARDIAC DEFIBRILLATOR PLACEMENT     initial BiV ICD 09/22/00 in [place].  Gen change (MDT) by Dr Leonia Reeves 10/30/09  . CARDIOVERSION N/A 01/09/2016   Procedure: CARDIOVERSION;  Surgeon: Larey Dresser, MD;  Location: [facility] ENDOSCOPY;  Service: Cardiovascular;  Laterality: N/A;  . FOOT SURGERY Right    "removed a knot off top of my foot" (06/21/2013)  . SUPRAVENTRICULAR TACHYCARDIA ABLATION N/A 06/25/2013   Procedure: SUPRAVENTRICULAR TACHYCARDIA ABLATION;  Surgeon: Evans Lance, MD;  Location: [facility] CATH LAB;  Service: Cardiovascular;  Laterality: N/A;  . TEE WITHOUT CARDIOVERSION N/A 01/09/2016   Procedure: TRANSESOPHAGEAL ECHOCARDIOGRAM (TEE);  Surgeon: Larey Dresser, MD;  Location: [facility];  Service: Cardiovascular;  Laterality: N/A;  . TONSILLECTOMY  1940's  . VENA CAVA FILTER PLACEMENT  2007   [name] 05/25/2013 (06/21/2013)  . VIDEO BRONCHOSCOPY  02/26/2012   Procedure: VIDEO BRONCHOSCOPY WITHOUT FLUORO;  Surgeon: Kathee Delton, MD;  Location: [facility] ENDOS[facility];  Service: Cardiopulmonary;  Laterality: Bilateral;    Current Outpatient Prescriptions  Medication Sig Dispense Refill  . Ascorbic Acid (VITAMIN C) 1000 MG  tablet Take 1,000 mg by mouth every morning.     . budesonide-formoterol (SYMBICORT) 160-4.5 MCG/ACT inhaler Inhale 2 puffs into the lungs 2 (two) times Delgado. 3 Inhaler 1  . calcitRIOL (ROCALTROL) 0.5 MCG capsule Take 0.5 mcg by mouth every other day.     . carvedilol (COREG) 3.125 MG tablet Take 1 tablet (3.125 mg total) by mouth 2 (two) times Delgado. 180 tablet 3  . digoxin (DIGITEK) 0.125 MG tablet Take 1 tablet (125 mcg total) by mouth every morning. 90 tablet 3  . doxycycline (VIBRA-TABS) 100 MG tablet  Take 1 tablet (100 mg total) by mouth 2 (two) times Delgado. 14 tablet 0  . Febuxostat (ULORIC) 80 MG TABS Take 80 mg by mouth every morning.     . levalbuterol (XOPENEX HFA) 45 MCG/ACT inhaler Inhale 2 puffs into the lungs every 6 (six) hours as needed for wheezing.    . pregabalin (LYRICA) 150 MG capsule Take 150 mg by mouth 2 (two) times Delgado.      . sotalol (BETAPACE) 160 MG tablet Take 1 tablet (160 mg total) by mouth Delgado. 90 tablet 3  . spironolactone (ALDACTONE) 25 MG tablet Take 0.5 tablets (12.5 mg total) by mouth Delgado. 45 tablet 3  . Tamsulosin HCl (FLOMAX) 0.4 MG CAPS Take 0.4 mg by mouth every morning.     . tiotropium (SPIRIVA) 18 MCG inhalation capsule Place 1 capsule (18 mcg total) into inhaler and inhale every morning. 90 capsule 1  . torsemide (DEMADEX) 20 MG tablet Take 4 tablets (80 mg) in the morning and 2 tablets (40 mg) in the afternoon 540 tablet 3  . warfarin (COUMADIN) 5 MG tablet Take 7.5 mg Delgado except 5 mg on Tuesday 15 tablet 0   No current facility-administered medications for this visit.     Allergies:   Amiodarone   Social History: Social History   Social History  . Marital status: Married    Spouse name: N/A  . Number of children: N/A  . Years of education: N/A   Occupational History  . reitred     from education   Social History Main Topics  . Smoking status: Former Smoker    Packs/day: 1.50    Years: 30.00    Types: Cigarettes  . Smokeless tobacco: Never Used     Comment: 06/21/2013 "quit smoking ~ 1986"  . Alcohol use No     Comment: 06/21/2013 "quit drinking ~ 1970"  . Drug use: Yes    Types: Marijuana     Comment: 06/21/2013 "stopped using marijuana ~ 1970"  . Sexual activity: Not Currently   Other Topics Concern  . Not on file   Social History Narrative   Married and has 1 son.    Lives in [place].  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    Family History: Family History  Problem Relation Age of Onset  . Heart disease Mother      mother also had Rheumatism.  Marland Kitchen Heart attack Neg Hx   . Stroke Neg Hx   . Hypertension Mother     Review of Systems: All other systems reviewed and are otherwise negative except as noted above.   Physical Exam: VS:  There were no vitals taken for this visit. , BMI There is no height or weight on file to calculate BMI.  GEN- The patient is elderly and chronically ill appearing, alert and oriented x 3 today.   HEENT: normocephalic, atraumatic; sclera clear, conjunctiva pink; hearing intact; oropharynx clear;  neck supple  Lungs- normal work of breathing, diminished breath sounds throughout, on continuous home O2 Heart- Regular rate and rhythm (paced) GI- soft, non-tender, non-distended, bowel sounds present  Extremities- no clubbing, cyanosis, *** edema MS- no significant deformity or atrophy Skin- warm and dry, no rash or lesion; ICD pocket well healed Psych- euthymic mood, full affect Neuro- strength and sensation are intact  ICD interrogation- reviewed in detail today,  See PACEART report  EKG:  EKG is ordered today. The ekg ordered today shows AV pacing, stable QTc  Recent Labs: 07/23/2016: B Natriuretic Peptide 268.9 09/19/2016: BUN 44; Creatinine, Ser 2.29; Hemoglobin 12.0; Platelets 124; Potassium 3.9; Sodium 139   Wt Readings from Last 3 Encounters:  09/19/16 287 lb 6.4 oz (130.4 kg)  08/22/16 293 lb 3.2 oz (133 kg)  07/23/16 292 lb 12.8 oz (132.8 kg)     Other studies Reviewed: Additional studies/ records that were reviewed today include: Dr Jackalyn Lombard office notes  Assessment and Plan:  1.  Chronic systolic dysfunction Euvolemic on exam Stable Class III symptoms Normal ICD function See Pace Art report No changes today Enroll in [facility] clinic   2.  Atrial tachycardia/persistent atrial fibrillation Continue Sotalol - predominately maintaining SR by device interrogation today Recent BMET, EKG stable  Continue Warfarin for CHADS2VASC of 4  3.  Complete heart  block Normal device function No changes today  4.  CRI Followed by renal   Follow up with [facility], ICM clinic, AHF clinic as scheduled, Dr Rayann Heman 12 months   Signed, Chanetta Marshall, NP 09/27/2016 10:13 AM  [facility] [address] [place] [address] [phone] (office) [phone] (fax)

## 2016-09-30 ENCOUNTER — Encounter: Payer: Medicare Other | Admitting: Nurse Practitioner

## 2016-10-01 ENCOUNTER — Encounter: Payer: Self-pay | Admitting: Cardiology

## 2016-10-02 ENCOUNTER — Encounter: Payer: Self-pay | Admitting: Nurse Practitioner

## 2016-10-04 ENCOUNTER — Telehealth (HOSPITAL_COMMUNITY): Payer: Self-pay | Admitting: Internal Medicine

## 2016-10-04 NOTE — Telephone Encounter (Signed)
I called and left message on voicemail to call office about scheduling for Cardiac Rehab program. Contact information left on patient voicemail.

## 2016-10-07 ENCOUNTER — Telehealth (HOSPITAL_COMMUNITY): Payer: Self-pay | Admitting: Internal Medicine

## 2016-10-07 NOTE — Telephone Encounter (Signed)
Patient returned my call. Spoke with patient about where would he would like to attend [facility] or [facility], since patient lives in [place]. Patient prefers to come to [facility] and wanted me to send him a brochure about the program before he makes his decision. I have mailed the brochure for [facility] per patient request.

## 2016-10-09 ENCOUNTER — Telehealth: Payer: Self-pay | Admitting: Cardiology

## 2016-10-09 NOTE — Telephone Encounter (Signed)
Pt said he received a letter stating that he needs a device check.

## 2016-10-10 ENCOUNTER — Ambulatory Visit (INDEPENDENT_AMBULATORY_CARE_PROVIDER_SITE_OTHER): Payer: Medicare Other | Admitting: *Deleted

## 2016-10-10 DIAGNOSIS — Z5181 Encounter for therapeutic drug level monitoring: Secondary | ICD-10-CM | POA: Diagnosis not present

## 2016-10-10 DIAGNOSIS — I480 Paroxysmal atrial fibrillation: Secondary | ICD-10-CM | POA: Diagnosis not present

## 2016-10-10 LAB — POCT INR: INR: 3.6

## 2016-10-14 ENCOUNTER — Ambulatory Visit (INDEPENDENT_AMBULATORY_CARE_PROVIDER_SITE_OTHER): Payer: Medicare Other | Admitting: *Deleted

## 2016-10-14 DIAGNOSIS — I428 Other cardiomyopathies: Secondary | ICD-10-CM

## 2016-10-15 LAB — CUP PACEART REMOTE DEVICE CHECK
Battery Remaining Longevity: 58 mo
Brady Statistic AP VS Percent: 0.46 %
Brady Statistic AS VP Percent: 15.64 %
Brady Statistic AS VS Percent: 0.5 %
Brady Statistic RA Percent Paced: 81.56 %
HIGH POWER IMPEDANCE MEASURED VALUE: 49 Ohm
HighPow Impedance: 63 Ohm
Implantable Lead Implant Date: 20020325
Implantable Lead Location: 753858
Implantable Lead Location: 753860
Implantable Lead Model: 5076
Implantable Pulse Generator Implant Date: 20160301
Lead Channel Impedance Value: 266 Ohm
Lead Channel Impedance Value: 266 Ohm
Lead Channel Impedance Value: 418 Ohm
Lead Channel Impedance Value: 475 Ohm
Lead Channel Impedance Value: 722 Ohm
Lead Channel Pacing Threshold Amplitude: 1 V
Lead Channel Pacing Threshold Pulse Width: 0.4 ms
Lead Channel Sensing Intrinsic Amplitude: 1.125 mV
Lead Channel Sensing Intrinsic Amplitude: 9.5 mV
Lead Channel Setting Pacing Amplitude: 2 V
Lead Channel Setting Pacing Pulse Width: 0.4 ms
MDC IDC LEAD IMPLANT DT: 20020325
MDC IDC LEAD IMPLANT DT: 20020325
MDC IDC LEAD LOCATION: 753859
MDC IDC MSMT BATTERY VOLTAGE: 2.96 V
MDC IDC MSMT LEADCHNL RA IMPEDANCE VALUE: 399 Ohm
MDC IDC MSMT LEADCHNL RA SENSING INTR AMPL: 1.125 mV
MDC IDC MSMT LEADCHNL RV PACING THRESHOLD AMPLITUDE: 1 V
MDC IDC MSMT LEADCHNL RV PACING THRESHOLD PULSEWIDTH: 0.4 ms
MDC IDC MSMT LEADCHNL RV SENSING INTR AMPL: 9.5 mV
MDC IDC SESS DTM: 20180416213108
MDC IDC SET LEADCHNL LV PACING AMPLITUDE: 1.5 V
MDC IDC SET LEADCHNL LV PACING PULSEWIDTH: 0.5 ms
MDC IDC SET LEADCHNL RV PACING AMPLITUDE: 2.5 V
MDC IDC SET LEADCHNL RV SENSING SENSITIVITY: 0.3 mV
MDC IDC STAT BRADY AP VP PERCENT: 83.4 %
MDC IDC STAT BRADY RV PERCENT PACED: 96.32 %

## 2016-10-15 NOTE — Progress Notes (Signed)
Remote ICD transmission.   

## 2016-10-15 NOTE — Progress Notes (Signed)
Electrophysiology Office Note Date: 10/17/2016  ID:  Maurice Delgado, DOB [birth year], MRN [id number]  PCP: Barbette Merino, MD Primary Cardiologist: Tamala Julian Electrophysiologist: Allred  CC: Routine ICD follow-up  Maurice Delgado is a 83 y.o. male seen today for Dr Rayann Heman.  He presents today for routine electrophysiology followup.  Since last being seen in our clinic, the patient reports doing relatively well. He underwent TEE guided DCCV in July of 2017.  He continues to be followed at [facility] wound clinic for [facility] boot placement as well as closely with the AHF clinic. He denies chest pain, palpitations, nausea, vomiting, dizziness, syncope, edema, weight gain, or early satiety.  He has not had ICD shocks.   Device History: MDT CRTD implanted 2002 for NICM, CHF; gen change 2011 and 2016 History of appropriate therapy: No History of AAD therapy: Yes - Sotalol for AF   Past Medical History:  Diagnosis Date  . Atrial tachycardia ([place])    a. s/p ablation 05/2013.  Marland Kitchen Chronic kidney disease (CKD), stage III (moderate)   . Chronic respiratory failure ([place])    a. On home O2 - 2-3L 24/7  . Chronic systolic CHF (congestive heart failure) ([place])   . COPD (chronic obstructive pulmonary disease) ([place])   . DVT (deep venous thrombosis) ([place])    a. Recurrent DVTs. b. s/p IVC filter 2007. c. DVTs dx 05/2013 - started back on Coumadin  . GERD (gastroesophageal reflux disease)   . Gout   . Hemoptysis    a. H/o of hemoptysis with pulmonary fibrosis (patient has refused biopsies in the past).  Marland Kitchen HTN (hypertension)   . 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  . Left fibular fracture    a. 03/2013 - nonsurgical mgmt.  . Nonischemic cardiomyopathy ([place])    a. EF 35%. b. s/p CRT-D device implanted - initially 2002 in [place], gen change 2011 (MDT).  . OSA on CPAP   . PAF (paroxysmal atrial fibrillation) ([place])   . Retroperitoneal bleed    a. Remotely on Coumadin (per Dr. Jackalyn Lombard prior office note).    Past Surgical History:  Procedure Laterality Date  . APPENDECTOMY  1940's   "I think so"  . BIV ICD GENERTAOR CHANGE OUT N/A 08/30/2014   Procedure: BIV ICD GENERTAOR CHANGE OUT;  Surgeon: Thompson Grayer, MD;  Location: [facility] CATH LAB;  Service: Cardiovascular;  Laterality: N/A;  . CARDIAC DEFIBRILLATOR PLACEMENT     initial BiV ICD 09/22/00 in [place].  Gen change (MDT) by Dr Leonia Reeves 10/30/09  . CARDIOVERSION N/A 01/09/2016   Procedure: CARDIOVERSION;  Surgeon: Larey Dresser, MD;  Location: [facility] ENDOSCOPY;  Service: Cardiovascular;  Laterality: N/A;  . FOOT SURGERY Right    "removed a knot off top of my foot" (06/21/2013)  . SUPRAVENTRICULAR TACHYCARDIA ABLATION N/A 06/25/2013   Procedure: SUPRAVENTRICULAR TACHYCARDIA ABLATION;  Surgeon: Evans Lance, MD;  Location: [facility] CATH LAB;  Service: Cardiovascular;  Laterality: N/A;  . TEE WITHOUT CARDIOVERSION N/A 01/09/2016   Procedure: TRANSESOPHAGEAL ECHOCARDIOGRAM (TEE);  Surgeon: Larey Dresser, MD;  Location: [facility];  Service: Cardiovascular;  Laterality: N/A;  . TONSILLECTOMY  1940's  . VENA CAVA FILTER PLACEMENT  2007   [name] 05/25/2013 (06/21/2013)  . VIDEO BRONCHOSCOPY  02/26/2012   Procedure: VIDEO BRONCHOSCOPY WITHOUT FLUORO;  Surgeon: Kathee Delton, MD;  Location: [facility] ENDOSCOPY;  Service: Cardiopulmonary;  Laterality: Bilateral;    Current Outpatient Prescriptions  Medication Sig Dispense Refill  . Ascorbic Acid (VITAMIN C) 1000 MG tablet Take  1,000 mg by mouth every morning.     . budesonide-formoterol (SYMBICORT) 160-4.5 MCG/ACT inhaler Inhale 2 puffs into the lungs 2 (two) times daily. 3 Inhaler 1  . calcitRIOL (ROCALTROL) 0.5 MCG capsule Take 0.5 mcg by mouth every other day.     . carvedilol (COREG) 3.125 MG tablet Take 1 tablet (3.125 mg total) by mouth 2 (two) times daily. 180 tablet 3  . digoxin (DIGITEK) 0.125 MG tablet Take 1 tablet (125 mcg total) by mouth every morning. 90 tablet 3  . doxycycline (VIBRA-TABS) 100 MG tablet  Take 1 tablet (100 mg total) by mouth 2 (two) times daily. 14 tablet 0  . Febuxostat (ULORIC) 80 MG TABS Take 80 mg by mouth every morning.     . levalbuterol (XOPENEX HFA) 45 MCG/ACT inhaler Inhale 2 puffs into the lungs every 6 (six) hours as needed for wheezing.    . pregabalin (LYRICA) 150 MG capsule Take 150 mg by mouth 2 (two) times daily.      . sotalol (BETAPACE) 160 MG tablet Take 1 tablet (160 mg total) by mouth daily. 90 tablet 3  . spironolactone (ALDACTONE) 25 MG tablet Take 0.5 tablets (12.5 mg total) by mouth daily. 45 tablet 3  . Tamsulosin HCl (FLOMAX) 0.4 MG CAPS Take 0.4 mg by mouth every morning.     . tiotropium (SPIRIVA) 18 MCG inhalation capsule Place 1 capsule (18 mcg total) into inhaler and inhale every morning. 90 capsule 1  . torsemide (DEMADEX) 20 MG tablet Take 4 tablets (80 mg) in the morning and 2 tablets (40 mg) in the afternoon 540 tablet 3  . warfarin (COUMADIN) 5 MG tablet Take 7.5 mg daily except 5 mg on Tuesday 15 tablet 0   No current facility-administered medications for this visit.     Allergies:   Amiodarone   Social History: Social History   Social History  . Marital status: Married    Spouse name: N/A  . Number of children: N/A  . Years of education: N/A   Occupational History  . reitred     from education   Social History Main Topics  . Smoking status: Former Smoker    Packs/day: 1.50    Years: 30.00    Types: Cigarettes  . Smokeless tobacco: Never Used     Comment: 06/21/2013 "quit smoking ~ 1986"  . Alcohol use No     Comment: 06/21/2013 "quit drinking ~ 1970"  . Drug use: Yes    Types: Marijuana     Comment: 06/21/2013 "stopped using marijuana ~ 1970"  . Sexual activity: Not Currently   Other Topics Concern  . Not on file   Social History Narrative   Married and has 1 son.    Lives in [place].  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    Family History: Family History  Problem Relation Age of Onset  . Heart disease Mother      mother also had Rheumatism.  Marland Kitchen Hypertension Mother   . Heart attack Neg Hx   . Stroke Neg Hx     Review of Systems: All other systems reviewed and are otherwise negative except as noted above.   Physical Exam: VS:  BP 120/66   Pulse 89   Ht 6\' 2"  (1.88 m)   Wt 292 lb (132.5 kg)   SpO2 90%   BMI 37.49 kg/m  , BMI Body mass index is 37.49 kg/m.  GEN- The patient is elderly and chronically ill appearing, alert and oriented x  3 today.   HEENT: normocephalic, atraumatic; sclera clear, conjunctiva pink; hearing intact; oropharynx clear; neck supple  Lungs- normal work of breathing, diminished breath sounds throughout, on continuous home O2 Heart- Regular rate and rhythm (paced) GI- soft, non-tender, non-distended, bowel sounds present  Extremities- no clubbing, cyanosis, +dependent edema MS- no significant deformity or atrophy Skin- warm and dry, no rash or lesion; ICD pocket well healed Psych- euthymic mood, full affect Neuro- strength and sensation are intact  ICD interrogation- reviewed in detail today,  See PACEART report  EKG:  EKG is not ordered today.  Recent Labs: 07/23/2016: B Natriuretic Peptide 268.9 09/19/2016: BUN 44; Creatinine, Ser 2.29; Hemoglobin 12.0; Platelets 124; Potassium 3.9; Sodium 139   Wt Readings from Last 3 Encounters:  10/17/16 292 lb (132.5 kg)  09/19/16 287 lb 6.4 oz (130.4 kg)  08/22/16 293 lb 3.2 oz (133 kg)     Other studies Reviewed: Additional studies/ records that were reviewed today include: Dr Jackalyn Lombard office notes  Assessment and Plan:  1.  Chronic systolic dysfunction Euvolemic on exam Stable Class III symptoms Normal ICD function See Pace Art report No changes today  2.  Atrial tachycardia/persistent atrial fibrillation Continue Sotalol - predominately maintaining SR by device interrogation today. QTc stable 07/2016 Continue Warfarin for CHADS2VASC of 4 BMET today  3.  Complete heart block Normal device function No  changes today  4.  CRI Followed by renal   Follow up with [facility], ICM clinic, AHF clinic as scheduled, Dr Rayann Heman 12 months   Signed, Chanetta Marshall, NP 10/17/2016 12:03 PM  [facility] [address] [place] [address] [phone] (office) [phone] (fax)

## 2016-10-16 ENCOUNTER — Encounter: Payer: Self-pay | Admitting: Cardiology

## 2016-10-17 ENCOUNTER — Encounter: Payer: Self-pay | Admitting: Nurse Practitioner

## 2016-10-17 ENCOUNTER — Ambulatory Visit (INDEPENDENT_AMBULATORY_CARE_PROVIDER_SITE_OTHER): Payer: Medicare Other | Admitting: Nurse Practitioner

## 2016-10-17 VITALS — BP 120/66 | HR 89 | Ht 74.0 in | Wt 292.0 lb

## 2016-10-17 DIAGNOSIS — I5022 Chronic systolic (congestive) heart failure: Secondary | ICD-10-CM

## 2016-10-17 DIAGNOSIS — I4819 Other persistent atrial fibrillation: Secondary | ICD-10-CM

## 2016-10-17 DIAGNOSIS — I481 Persistent atrial fibrillation: Secondary | ICD-10-CM

## 2016-10-17 DIAGNOSIS — I442 Atrioventricular block, complete: Secondary | ICD-10-CM

## 2016-10-17 DIAGNOSIS — I480 Paroxysmal atrial fibrillation: Secondary | ICD-10-CM | POA: Diagnosis not present

## 2016-10-17 LAB — CUP PACEART INCLINIC DEVICE CHECK
Implantable Lead Implant Date: 20020325
Implantable Lead Implant Date: 20020325
Implantable Lead Location: 753859
Implantable Lead Model: 5076
MDC IDC LEAD IMPLANT DT: 20020325
MDC IDC LEAD LOCATION: 753858
MDC IDC LEAD LOCATION: 753860
MDC IDC PG IMPLANT DT: 20160301
MDC IDC SESS DTM: 20180419112705

## 2016-10-17 LAB — BASIC METABOLIC PANEL
BUN/Creatinine Ratio: 17 (ref 10–24)
BUN: 31 mg/dL — ABNORMAL HIGH (ref 8–27)
CO2: 30 mmol/L — ABNORMAL HIGH (ref 18–29)
CREATININE: 1.8 mg/dL — AB (ref 0.76–1.27)
Calcium: 9.6 mg/dL (ref 8.6–10.2)
Chloride: 98 mmol/L (ref 96–106)
GFR calc non Af Amer: 35 mL/min/{1.73_m2} — ABNORMAL LOW (ref >59)
GFR, EST AFRICAN AMERICAN: 40 mL/min/{1.73_m2} — AB (ref >59)
GLUCOSE: 112 mg/dL — AB (ref 65–99)
Potassium: 4.3 mmol/L (ref 3.5–5.2)
SODIUM: 145 mmol/L — AB (ref 134–144)

## 2016-10-17 NOTE — Patient Instructions (Signed)
Medication Instructions:  None Ordered   Labwork: Your physician recommends that you return for lab work today for BMET  Testing/Procedures: None Ordered   Follow-Up: Remote monitoring is used to monitor your Pacemaker from home. This monitoring reduces the number of office visits required to check your device to one time per year. It allows [place] to keep an eye on the functioning of your device to ensure it is working properly. You are scheduled for a device check from home on 01/16/2017. You may send your transmission at any time that day. If you have a wireless device, the transmission will be sent automatically. After your physician reviews your transmission, you will receive a postcard with your next transmission date.  Your physician wants you to follow-up in: 1 year with Dr. Rayann Heman. You will receive a reminder letter in the mail two months in advance. If you don't receive a letter, please call our office to schedule the follow-up appointment.   Any Other Special Instructions Will Be Listed Below (If Applicable).     If you need a refill on your cardiac medications before your next appointment, please call your pharmacy.

## 2016-10-22 ENCOUNTER — Telehealth (HOSPITAL_COMMUNITY): Payer: Self-pay | Admitting: Internal Medicine

## 2016-10-22 NOTE — Telephone Encounter (Signed)
Pt called back states his wife wants him to go to [facility] due to closer to home, faxed over referral to [place] at [facility]... KJ

## 2016-10-23 ENCOUNTER — Other Ambulatory Visit: Payer: Self-pay | Admitting: *Deleted

## 2016-10-24 ENCOUNTER — Ambulatory Visit (INDEPENDENT_AMBULATORY_CARE_PROVIDER_SITE_OTHER): Payer: Medicare Other | Admitting: Pharmacist

## 2016-10-24 DIAGNOSIS — I480 Paroxysmal atrial fibrillation: Secondary | ICD-10-CM

## 2016-10-24 DIAGNOSIS — Z5181 Encounter for therapeutic drug level monitoring: Secondary | ICD-10-CM

## 2016-10-24 LAB — POCT INR: INR: 2.8

## 2016-11-04 ENCOUNTER — Ambulatory Visit (INDEPENDENT_AMBULATORY_CARE_PROVIDER_SITE_OTHER): Payer: Medicare Other | Admitting: Pulmonary Disease

## 2016-11-04 ENCOUNTER — Encounter: Payer: Self-pay | Admitting: Pulmonary Disease

## 2016-11-04 VITALS — BP 92/62 | HR 63 | Ht 75.0 in | Wt 292.4 lb

## 2016-11-04 DIAGNOSIS — J432 Centrilobular emphysema: Secondary | ICD-10-CM

## 2016-11-04 DIAGNOSIS — J9611 Chronic respiratory failure with hypoxia: Secondary | ICD-10-CM | POA: Diagnosis not present

## 2016-11-04 MED ORDER — UMECLIDINIUM-VILANTEROL 62.5-25 MCG/INH IN AEPB: 1.0000 | INHALATION_SPRAY | Freq: Every day | RESPIRATORY_TRACT | 0 refills | Status: AC

## 2016-11-04 MED ORDER — UMECLIDINIUM-VILANTEROL 62.5-25 MCG/INH IN AEPB: 1.0000 | INHALATION_SPRAY | Freq: Every day | RESPIRATORY_TRACT | 0 refills | Status: DC

## 2016-11-04 NOTE — Patient Instructions (Signed)
Anoro one puff daily  Don't use spiriva or symbicort while using anoro  Call if you want to refill anoro >> will then send in 3 month supply  Follow up in 1 year

## 2016-11-04 NOTE — Addendum Note (Signed)
Addended by: Virl Cagey on: 11/04/2016 04:32 PM   Modules accepted: Orders

## 2016-11-04 NOTE — Progress Notes (Signed)
Current Outpatient Prescriptions on File Prior to Visit  Medication Sig  . Ascorbic Acid (VITAMIN C) 1000 MG tablet Take 1,000 mg by mouth every morning.   . budesonide-formoterol (SYMBICORT) 160-4.5 MCG/ACT inhaler Inhale 2 puffs into the lungs 2 (two) times daily.  . calcitRIOL (ROCALTROL) 0.5 MCG capsule Take 0.5 mcg by mouth every other day.   . carvedilol (COREG) 3.125 MG tablet Take 1 tablet (3.125 mg total) by mouth 2 (two) times daily.  . digoxin (DIGITEK) 0.125 MG tablet Take 1 tablet (125 mcg total) by mouth every morning.  . Febuxostat (ULORIC) 80 MG TABS Take 80 mg by mouth every morning.   . levalbuterol (XOPENEX HFA) 45 MCG/ACT inhaler Inhale 2 puffs into the lungs every 6 (six) hours as needed for wheezing.  . pregabalin (LYRICA) 150 MG capsule Take 150 mg by mouth 2 (two) times daily.    . sotalol (BETAPACE) 160 MG tablet Take 1 tablet (160 mg total) by mouth daily.  Marland Kitchen spironolactone (ALDACTONE) 25 MG tablet Take 0.5 tablets (12.5 mg total) by mouth daily.  . Tamsulosin HCl (FLOMAX) 0.4 MG CAPS Take 0.4 mg by mouth every morning.   . tiotropium (SPIRIVA) 18 MCG inhalation capsule Place 1 capsule (18 mcg total) into inhaler and inhale every morning.  . torsemide (DEMADEX) 20 MG tablet Take 4 tablets (80 mg) in the morning and 2 tablets (40 mg) in the afternoon  . warfarin (COUMADIN) 5 MG tablet Take 7.5 mg daily except 5 mg on Tuesday   No current facility-administered medications on file prior to visit.     Chief Complaint  Patient presents with  . Follow-up    Pt denies any new breathing issues. Still using 2 liters O2.      Sleep tests PSG 08/11/05 >> RDI 33.8, SaO2 low 74% PSG 05/17/15 >> AHI 1, SpO2 low 96%.  Used 3 liters oxygen  Pulmonary tests PFT 06/14/08 >> FEV1 2.24 (71%), FEV1% 51, TLC 7.62 (106%), DLCO 40%, no BD V/Q 05/25/13 >> very low prob PE  Cardiac tests Echo 07/06/15 >> EF 20 to 41%, grade 2 diastolic dysfx  Past medical history Non ischemic CM,  complete heart block s/p BiV ICD, Atrial fibrillation/Atrial tachycardia, HTN, recurrent DVT, Gout, CKD, Hypothyroidism  Past surgical history, Family history, Social history, Allergies reviewed  Vital signs BP 92/62 (BP Location: Left Arm, Cuff Size: Normal)   Pulse 63   Ht 6\' 3"  (1.905 m)   Wt 292 lb 6.4 oz (132.6 kg)   SpO2 97%   BMI 36.55 kg/m   History of Present Illness: Maurice Delgado is a 83 y.o. male former smoker COPD with emphysema and chronic respiratory failure with hypoxia.  He is not very active.  He gets winded easily.  He is using 3 liters oxygen 24/7.  He is not having cough, wheeze, or sputum.  He hasn't been on Abx or prednisone recently for his breathing.   Physical Exam:  General - pleasant, wearing oxygen Eyes - pupils reactive ENT - no sinus tenderness, no oral exudate, no LAN, wears dentures Cardiac - regular, no murmur Chest - no wheeze, rales Abd - soft, non tender Ext - 1+ edema Skin - no rashes Neuro - normal strength Psych - normal mood    Assessment/Plan:   COPD with emphysema. - will change him to LAMA/LABA combination with anoro and d/c ICS - he will call after trying anoro for one month >> if things are going well, then will send three  month supply - continue prn xopenex  Chronic respiratory failure with hypoxia. - continue 3 liters oxygen 24/7  Deconditioning. - discussed the benefits for a monitored exercise regimen   Patient Instructions  Anoro one puff daily  Don't use spiriva or symbicort while using anoro  Call if you want to refill anoro >> will then send in 3 month supply  Follow up in 1 year   Maurice Mires, MD [facility]Critical [facility]/Sleep Pager:  [phone] 11/04/2016, 4:12 PM

## 2016-11-21 ENCOUNTER — Ambulatory Visit (INDEPENDENT_AMBULATORY_CARE_PROVIDER_SITE_OTHER): Payer: Medicare Other | Admitting: Pharmacist

## 2016-11-21 DIAGNOSIS — Z5181 Encounter for therapeutic drug level monitoring: Secondary | ICD-10-CM

## 2016-11-21 DIAGNOSIS — I480 Paroxysmal atrial fibrillation: Secondary | ICD-10-CM | POA: Diagnosis not present

## 2016-11-21 LAB — POCT INR: INR: 2.6

## 2016-11-26 DIAGNOSIS — I5020 Unspecified systolic (congestive) heart failure: Secondary | ICD-10-CM | POA: Diagnosis not present

## 2016-11-26 DIAGNOSIS — D481 Neoplasm of uncertain behavior of connective and other soft tissue: Secondary | ICD-10-CM | POA: Diagnosis not present

## 2016-11-26 DIAGNOSIS — N184 Chronic kidney disease, stage 4 (severe): Secondary | ICD-10-CM | POA: Diagnosis not present

## 2016-11-26 DIAGNOSIS — J449 Chronic obstructive pulmonary disease, unspecified: Secondary | ICD-10-CM | POA: Diagnosis not present

## 2016-12-02 ENCOUNTER — Ambulatory Visit (HOSPITAL_COMMUNITY)
Admission: RE | Admit: 2016-12-02 | Discharge: 2016-12-02 | Disposition: A | Discharge status: Home / Self Care | Payer: Medicare Other | Source: Ambulatory Visit | Attending: Cardiology | Admitting: Cardiology

## 2016-12-02 VITALS — BP 127/82 | HR 85 | Wt 295.0 lb

## 2016-12-02 DIAGNOSIS — I480 Paroxysmal atrial fibrillation: Secondary | ICD-10-CM | POA: Diagnosis not present

## 2016-12-02 DIAGNOSIS — M109 Gout, unspecified: Secondary | ICD-10-CM | POA: Insufficient documentation

## 2016-12-02 DIAGNOSIS — G4733 Obstructive sleep apnea (adult) (pediatric): Secondary | ICD-10-CM | POA: Insufficient documentation

## 2016-12-02 DIAGNOSIS — Z87891 Personal history of nicotine dependence: Secondary | ICD-10-CM | POA: Insufficient documentation

## 2016-12-02 DIAGNOSIS — K219 Gastro-esophageal reflux disease without esophagitis: Secondary | ICD-10-CM | POA: Diagnosis not present

## 2016-12-02 DIAGNOSIS — I5022 Chronic systolic (congestive) heart failure: Secondary | ICD-10-CM | POA: Diagnosis not present

## 2016-12-02 DIAGNOSIS — I428 Other cardiomyopathies: Secondary | ICD-10-CM | POA: Diagnosis not present

## 2016-12-02 DIAGNOSIS — Z79899 Other long term (current) drug therapy: Secondary | ICD-10-CM | POA: Insufficient documentation

## 2016-12-02 DIAGNOSIS — I519 Heart disease, unspecified: Secondary | ICD-10-CM | POA: Diagnosis not present

## 2016-12-02 DIAGNOSIS — Z95810 Presence of automatic (implantable) cardiac defibrillator: Secondary | ICD-10-CM | POA: Diagnosis not present

## 2016-12-02 DIAGNOSIS — Z86718 Personal history of other venous thrombosis and embolism: Secondary | ICD-10-CM | POA: Insufficient documentation

## 2016-12-02 DIAGNOSIS — I442 Atrioventricular block, complete: Secondary | ICD-10-CM | POA: Insufficient documentation

## 2016-12-02 DIAGNOSIS — J449 Chronic obstructive pulmonary disease, unspecified: Secondary | ICD-10-CM | POA: Insufficient documentation

## 2016-12-02 DIAGNOSIS — N183 Chronic kidney disease, stage 3 (moderate): Secondary | ICD-10-CM | POA: Insufficient documentation

## 2016-12-02 DIAGNOSIS — Z7901 Long term (current) use of anticoagulants: Secondary | ICD-10-CM | POA: Insufficient documentation

## 2016-12-02 DIAGNOSIS — Z9981 Dependence on supplemental oxygen: Secondary | ICD-10-CM | POA: Diagnosis not present

## 2016-12-02 LAB — BASIC METABOLIC PANEL
Anion gap: 9 (ref 5–15)
BUN: 40 mg/dL — ABNORMAL HIGH (ref 6–20)
CO2: 33 mmol/L — ABNORMAL HIGH (ref 22–32)
Calcium: 9.8 mg/dL (ref 8.9–10.3)
Chloride: 99 mmol/L — ABNORMAL LOW (ref 101–111)
Creatinine, Ser: 2.23 mg/dL — ABNORMAL HIGH (ref 0.61–1.24)
GFR calc Af Amer: 30 mL/min — ABNORMAL LOW (ref >60)
GFR calc non Af Amer: 26 mL/min — ABNORMAL LOW (ref >60)
Glucose, Bld: 112 mg/dL — ABNORMAL HIGH (ref 65–99)
Potassium: 4.5 mmol/L (ref 3.5–5.1)
Sodium: 141 mmol/L (ref 135–145)

## 2016-12-02 LAB — DIGOXIN LEVEL: Digoxin Level: 0.7 ng/mL — ABNORMAL LOW (ref 0.8–2.0)

## 2016-12-02 MED ORDER — CARVEDILOL 6.25 MG PO TABS: 6.2500 mg | ORAL_TABLET | Freq: Two times a day (BID) | ORAL | 3 refills | Status: AC

## 2016-12-02 MED ORDER — TORSEMIDE 20 MG PO TABS: ORAL_TABLET | ORAL | 3 refills | Status: DC

## 2016-12-02 NOTE — Patient Instructions (Signed)
Increase Carvedilol to 6.25 mg Twice daily   Increase Torsemide to 80 mg (4 tabs) in AM and 60 mg (3 tabs) in PM  Labs today  You have been referred to Cardiac Rehab at [facility], they will contact you to schedule  We will contact you in 3 months to schedule your next appointment.

## 2016-12-03 NOTE — Progress Notes (Signed)
Patient ID: Maurice Delgado, male   DOB: 01/23/1934, 83 y.o.   MRN: [id number]    Advanced Heart Failure Clinic Note   PCP: Dr. Jonelle Sidle HF Cardiology: Dr Aundra Dubin Nephrology: Dr Deterding  Maurice Delgado is a 83 y.o. male with history of chronic systolic CHF due to NICM, CHB s/p Medtronic CRT-D 2002 with generator change 2011, HTN, history of DVT on chronic anticoag with coumadin, COPD on home O2, PAF, CKD stage III.  He went into atrial fibrillation in 7/17 and had TEE-guided DCCV.    Had foot wound at last appointment but this seems to have healed. Weight is up about 8 lbs.  He walks with a walker.  He is quickly winded when he walks if he does not use his oxygen.  Generally no dyspnea when walking if he uses oxygen.  No lightheadedness or chest pain.  No orthopnea/PND.   Optivol: fluid index < threshold, stable impedance, rare atrial fibrillation (short episodes), >99% BiV pacing, minimal daily activity.   Labs (1/17): K 3.6, creatinine 1.56 Labs 07/20/2015: K 4.5 Creatinine 2.33, digoxin 0.5  Labs (2/17): K 4.3, creatinine 2.24 Labs (3/17) K, 4.4, Creatinine 1.73, Dig 0.5 Lab (10/12/2015): INR 2.4  Labs (10/17/2015): K 4.1 Creatinine 1.77 Labs (01/2016): dig level 0.5 K 4.2 Creatinine 1.44 Labs (10/17): K 3.8, creatinine 1.71 Labs (3/18): digoxin 0.3 Labs (4/18): K 4.3, creatinine 1.8  ECG: Personally reviewed, NSR, BiV pacing, QTc 497 msec  PMH: 1. Sciatica 2. H/o DVT: Recurrent.  Has IVC filter 3. Complete heart block: Medtronic CRT-D device placed 2002.  4. Atrial fibrillation: Paroxysmal. On sotalol to maintain NSR.  - TEE-guided DCCV in 7/17.  5. OSA: No longer uses CPAP.  6. Chronic systolic CHF: Echo (0/10) with EF 20-25%, mildly dilated LV, normal RV size and systolic function, mild MR.  Nonischemic cardiomyopathy.  Has MDT CRT-D device.  - Hypotension with low dose Bidil.  - TEE (7/17): EF 30%, diffuse hypokinesis, mildly dilated RV with mildly decreased systolic function.   7. COPD: On home oxygen.  8. History of retroperitoneal hemorrhage.  9. CKD: Stage III.  10. Gout 11. GERD 12. Atrial tachycardia: s/p ablation 2014.  13. Sleep study negative 14. ABIs (2/18): Normal.   Social History   Social History  . Marital status: Married    Spouse name: N/A  . Number of children: N/A  . Years of education: N/A   Occupational History  . reitred     from education   Social History Main Topics  . Smoking status: Former Smoker    Packs/day: 1.50    Years: 30.00    Types: Cigarettes  . Smokeless tobacco: Never Used     Comment: 06/21/2013 "quit smoking ~ 1986"  . Alcohol use No     Comment: 06/21/2013 "quit drinking ~ 1970"  . Drug use: Yes    Types: Marijuana     Comment: 06/21/2013 "stopped using marijuana ~ 1970"  . Sexual activity: Not Currently   Other Topics Concern  . Not on file   Social History Narrative   Married and has 1 son.    Lives in [place].  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   Family History  Problem Relation Age of Onset  . Heart disease Mother        mother also had Rheumatism.  Marland Kitchen Hypertension Mother   . Heart attack Neg Hx   . Stroke Neg Hx    ROS: All systems reviewed and negative except as per  HPI.   Current Outpatient Prescriptions  Medication Sig Dispense Refill  . Ascorbic Acid (VITAMIN C) 1000 MG tablet Take 1,000 mg by mouth every morning.     . calcitRIOL (ROCALTROL) 0.5 MCG capsule Take 0.5 mcg by mouth every other day.     . carvedilol (COREG) 6.25 MG tablet Take 1 tablet (6.25 mg total) by mouth 2 (two) times daily. 180 tablet 3  . digoxin (DIGITEK) 0.125 MG tablet Take 1 tablet (125 mcg total) by mouth every morning. 90 tablet 3  . Febuxostat (ULORIC) 80 MG TABS Take 80 mg by mouth every morning.     . pregabalin (LYRICA) 150 MG capsule Take 150 mg by mouth 2 (two) times daily.      . sotalol (BETAPACE) 160 MG tablet Take 1 tablet (160 mg total) by mouth daily. 90 tablet 3  . spironolactone (ALDACTONE) 25 MG  tablet Take 0.5 tablets (12.5 mg total) by mouth daily. 45 tablet 3  . Tamsulosin HCl (FLOMAX) 0.4 MG CAPS Take 0.4 mg by mouth every morning.     . torsemide (DEMADEX) 20 MG tablet Take 4 tablets (80 mg) in the morning and 3 tablets (60 mg) in the afternoon 630 tablet 3  . umeclidinium-vilanterol (ANORO ELLIPTA) 62.5-25 MCG/INH AEPB Inhale 1 puff into the lungs daily. 1 each 0  . warfarin (COUMADIN) 5 MG tablet Take 7.5 mg daily except 5 mg on Tuesday 15 tablet 0   No current facility-administered medications for this encounter.    BP 127/82   Pulse 85   Wt 295 lb (133.8 kg)   SpO2 95% Comment: on 2L of O2  BMI 36.87 kg/m    Wt Readings from Last 3 Encounters:  12/02/16 295 lb (133.8 kg)  11/04/16 292 lb 6.4 oz (132.6 kg)  10/17/16 292 lb (132.5 kg)    General: Obese, NAD, ambulated into clinic using a walker.     Neck: JVP 8 cm. No thyromegaly or nodule noted.   Lungs: Slightly decreased basilar sounds, Normal effort. CV: Nondisplaced PMI.  Heart regular S1/S2, no S3/S4, no murmur.    Abdomen: Obese, soft, NT, ND, no HSM. No bruits or masses. +BS  Skin: Intact without lesions or rashes.  Neurologic: Alert and oriented x 3.  Psych: Normal affect. Extremities: No clubbing or cyanosis. 1+ edema to knees bilaterally.  HEENT: Normal.   Assessment/Plan: 1. Atrial fibrillation: Paroxysmal. TEE-guided DCCV in 7/17. He is in NSR today. Rare AF on device assessment today.  - Continue sotalol, QTc acceptable. Allergic to amiodarone.  - Continue coumadin per coumadin clinic.   2. COPD: Stable on home 02 2 L via [place]. Likely a significant contributor to his dyspnea.   3. Chronic systolic CHF: EF 40-98%, nonischemic cardiomyopathy.  MDT CRT-D device. NYHA class III symptoms.  He is mildly volume overloaded on exam. He is not very active.  - Increase torsemide to 80 qam/60 qpm. Check BMET today and again in 2 wks.     - Has not tolerated Bidil due to symptomatic low BP.  - Continue digoxin  0.125 mg daily. Will check level today.      - No ACEI/ARB for now with elevated creatinine. - Increase Coreg to 6.25 mg bid.   - Continue spironolactone 12.5 mg daily.   - Needs to get more active, refer to cardiac rehab at [facility].  4. CKD: Stage III. Creatinine baseline ~1.7-1.8.   - BMET today.   5. Hx of DVT: Coumadin as above.  Followup in 3 months.   Loralie Champagne, MD  12/03/2016

## 2016-12-04 ENCOUNTER — Telehealth (HOSPITAL_COMMUNITY): Payer: Self-pay | Admitting: *Deleted

## 2016-12-04 DIAGNOSIS — I5022 Chronic systolic (congestive) heart failure: Secondary | ICD-10-CM

## 2016-12-04 NOTE — Telephone Encounter (Signed)
-----   Message from Larey Dresser, MD sent at 12/02/2016 11:02 PM EDT ----- Not far from baseline. Make sure to repeat BMET 7 days.

## 2016-12-06 ENCOUNTER — Other Ambulatory Visit: Payer: Self-pay | Admitting: Pulmonary Disease

## 2016-12-09 ENCOUNTER — Inpatient Hospital Stay (HOSPITAL_COMMUNITY): Admission: RE | Admit: 2016-12-09 | Payer: Medicare Other | Source: Ambulatory Visit

## 2016-12-10 ENCOUNTER — Other Ambulatory Visit: Payer: Self-pay | Admitting: *Deleted

## 2016-12-10 ENCOUNTER — Ambulatory Visit (HOSPITAL_COMMUNITY)
Admission: RE | Admit: 2016-12-10 | Discharge: 2016-12-10 | Disposition: A | Discharge status: Home / Self Care | Payer: Medicare Other | Source: Ambulatory Visit | Attending: Internal Medicine | Admitting: Internal Medicine

## 2016-12-10 DIAGNOSIS — I5022 Chronic systolic (congestive) heart failure: Secondary | ICD-10-CM | POA: Diagnosis not present

## 2016-12-10 LAB — BASIC METABOLIC PANEL
Anion gap: 8 (ref 5–15)
BUN: 31 mg/dL — AB (ref 6–20)
CHLORIDE: 99 mmol/L — AB (ref 101–111)
CO2: 33 mmol/L — AB (ref 22–32)
CREATININE: 2.02 mg/dL — AB (ref 0.61–1.24)
Calcium: 9.6 mg/dL (ref 8.9–10.3)
GFR calc Af Amer: 34 mL/min — ABNORMAL LOW (ref >60)
GFR calc non Af Amer: 29 mL/min — ABNORMAL LOW (ref >60)
Glucose, Bld: 114 mg/dL — ABNORMAL HIGH (ref 65–99)
Potassium: 4.1 mmol/L (ref 3.5–5.1)
SODIUM: 140 mmol/L (ref 135–145)

## 2016-12-10 MED ORDER — WARFARIN SODIUM 5 MG PO TABS: ORAL_TABLET | ORAL | 0 refills | Status: DC

## 2016-12-10 NOTE — Telephone Encounter (Signed)
Refill done as requested by pt. 

## 2016-12-11 ENCOUNTER — Other Ambulatory Visit: Payer: Self-pay | Admitting: Internal Medicine

## 2016-12-17 ENCOUNTER — Ambulatory Visit (INDEPENDENT_AMBULATORY_CARE_PROVIDER_SITE_OTHER): Payer: Medicare Other | Admitting: *Deleted

## 2016-12-17 DIAGNOSIS — I480 Paroxysmal atrial fibrillation: Secondary | ICD-10-CM | POA: Diagnosis not present

## 2016-12-17 DIAGNOSIS — Z5181 Encounter for therapeutic drug level monitoring: Secondary | ICD-10-CM

## 2016-12-17 LAB — POCT INR: INR: 3.6

## 2016-12-31 ENCOUNTER — Ambulatory Visit (INDEPENDENT_AMBULATORY_CARE_PROVIDER_SITE_OTHER): Payer: Medicare Other | Admitting: *Deleted

## 2016-12-31 DIAGNOSIS — Z5181 Encounter for therapeutic drug level monitoring: Secondary | ICD-10-CM

## 2016-12-31 DIAGNOSIS — I480 Paroxysmal atrial fibrillation: Secondary | ICD-10-CM

## 2016-12-31 LAB — POCT INR: INR: 4

## 2017-01-13 ENCOUNTER — Ambulatory Visit (INDEPENDENT_AMBULATORY_CARE_PROVIDER_SITE_OTHER): Payer: Medicare Other | Admitting: *Deleted

## 2017-01-13 DIAGNOSIS — I428 Other cardiomyopathies: Secondary | ICD-10-CM

## 2017-01-13 NOTE — Progress Notes (Signed)
Remote ICD transmission.   

## 2017-01-14 ENCOUNTER — Ambulatory Visit (INDEPENDENT_AMBULATORY_CARE_PROVIDER_SITE_OTHER): Payer: Medicare Other | Admitting: *Deleted

## 2017-01-14 DIAGNOSIS — I480 Paroxysmal atrial fibrillation: Secondary | ICD-10-CM

## 2017-01-14 DIAGNOSIS — Z5181 Encounter for therapeutic drug level monitoring: Secondary | ICD-10-CM

## 2017-01-14 LAB — CUP PACEART REMOTE DEVICE CHECK
Battery Remaining Longevity: 56 mo
Brady Statistic AP VP Percent: 91.27 %
Brady Statistic AP VS Percent: 0.14 %
Brady Statistic AS VP Percent: 8.47 %
Brady Statistic AS VS Percent: 0.11 %
Brady Statistic RV Percent Paced: 98.79 %
HighPow Impedance: 50 Ohm
HighPow Impedance: 64 Ohm
Implantable Lead Implant Date: 20020325
Implantable Lead Location: 753859
Implantable Lead Model: 5076
Implantable Pulse Generator Implant Date: 20160301
Lead Channel Impedance Value: 304 Ohm
Lead Channel Impedance Value: 513 Ohm
Lead Channel Impedance Value: 741 Ohm
Lead Channel Pacing Threshold Amplitude: 1 V
Lead Channel Pacing Threshold Pulse Width: 0.4 ms
Lead Channel Pacing Threshold Pulse Width: 0.4 ms
Lead Channel Sensing Intrinsic Amplitude: 1 mV
Lead Channel Sensing Intrinsic Amplitude: 20.125 mV
Lead Channel Setting Pacing Amplitude: 2 V
Lead Channel Setting Pacing Amplitude: 2.5 V
Lead Channel Setting Pacing Pulse Width: 0.5 ms
Lead Channel Setting Sensing Sensitivity: 0.3 mV
MDC IDC LEAD IMPLANT DT: 20020325
MDC IDC LEAD IMPLANT DT: 20020325
MDC IDC LEAD LOCATION: 753858
MDC IDC LEAD LOCATION: 753860
MDC IDC MSMT BATTERY VOLTAGE: 2.95 V
MDC IDC MSMT LEADCHNL LV IMPEDANCE VALUE: 399 Ohm
MDC IDC MSMT LEADCHNL RA IMPEDANCE VALUE: 418 Ohm
MDC IDC MSMT LEADCHNL RA SENSING INTR AMPL: 1 mV
MDC IDC MSMT LEADCHNL RV IMPEDANCE VALUE: 304 Ohm
MDC IDC MSMT LEADCHNL RV PACING THRESHOLD AMPLITUDE: 1.125 V
MDC IDC MSMT LEADCHNL RV SENSING INTR AMPL: 20.125 mV
MDC IDC SESS DTM: 20180716062823
MDC IDC SET LEADCHNL LV PACING AMPLITUDE: 1.5 V
MDC IDC SET LEADCHNL RV PACING PULSEWIDTH: 0.4 ms
MDC IDC STAT BRADY RA PERCENT PACED: 90.31 %

## 2017-01-14 LAB — POCT INR: INR: 3.6

## 2017-01-15 ENCOUNTER — Encounter: Payer: Self-pay | Admitting: Cardiology

## 2017-01-24 ENCOUNTER — Ambulatory Visit (INDEPENDENT_AMBULATORY_CARE_PROVIDER_SITE_OTHER): Payer: Medicare Other | Admitting: *Deleted

## 2017-01-24 DIAGNOSIS — I480 Paroxysmal atrial fibrillation: Secondary | ICD-10-CM | POA: Diagnosis not present

## 2017-01-24 DIAGNOSIS — Z5181 Encounter for therapeutic drug level monitoring: Secondary | ICD-10-CM | POA: Diagnosis not present

## 2017-01-24 LAB — POCT INR: INR: 3.8

## 2017-02-06 ENCOUNTER — Telehealth: Payer: Self-pay | Admitting: Pulmonary Disease

## 2017-02-06 NOTE — Telephone Encounter (Signed)
ATC pt back phone rang busy, unable to leave a vm

## 2017-02-07 ENCOUNTER — Ambulatory Visit (INDEPENDENT_AMBULATORY_CARE_PROVIDER_SITE_OTHER): Payer: Medicare Other | Admitting: *Deleted

## 2017-02-07 DIAGNOSIS — I480 Paroxysmal atrial fibrillation: Secondary | ICD-10-CM

## 2017-02-07 DIAGNOSIS — Z5181 Encounter for therapeutic drug level monitoring: Secondary | ICD-10-CM

## 2017-02-07 LAB — POCT INR: INR: 3.2

## 2017-02-07 NOTE — Telephone Encounter (Signed)
Atc pt X2, line rang a few times and then went silent.   Wcb.

## 2017-02-10 MED ORDER — UMECLIDINIUM-VILANTEROL 62.5-25 MCG/INH IN AEPB: INHALATION_SPRAY | RESPIRATORY_TRACT | 1 refills | Status: AC

## 2017-02-10 NOTE — Telephone Encounter (Signed)
Called spoke with patient and verified that pt is requesting a refill on his Anoro to [facility] Refills have been sent Nothing further needed Will sign off

## 2017-02-12 DIAGNOSIS — I129 Hypertensive chronic kidney disease with stage 1 through stage 4 chronic kidney disease, or unspecified chronic kidney disease: Secondary | ICD-10-CM | POA: Diagnosis not present

## 2017-02-12 DIAGNOSIS — J449 Chronic obstructive pulmonary disease, unspecified: Secondary | ICD-10-CM | POA: Diagnosis not present

## 2017-02-12 DIAGNOSIS — I480 Paroxysmal atrial fibrillation: Secondary | ICD-10-CM | POA: Diagnosis not present

## 2017-02-12 DIAGNOSIS — N2581 Secondary hyperparathyroidism of renal origin: Secondary | ICD-10-CM | POA: Diagnosis not present

## 2017-02-12 DIAGNOSIS — N183 Chronic kidney disease, stage 3 (moderate): Secondary | ICD-10-CM | POA: Diagnosis not present

## 2017-02-12 DIAGNOSIS — D631 Anemia in chronic kidney disease: Secondary | ICD-10-CM | POA: Diagnosis not present

## 2017-02-17 ENCOUNTER — Telehealth: Payer: Self-pay | Admitting: Pulmonary Disease

## 2017-02-17 NOTE — Telephone Encounter (Signed)
Attempted to contact pt. No answer, no option to leave a message. Will try back.  

## 2017-02-18 NOTE — Telephone Encounter (Signed)
Attempted to call the pt but the number listed hangs up before anyone can can answer.  Will try back later.

## 2017-02-19 NOTE — Telephone Encounter (Signed)
Attempted to call the pt but the phone will ring and then hangs up.

## 2017-02-20 NOTE — Telephone Encounter (Signed)
Attempted to call the pt this morning and the phone rings and then hangs up.  Will sign off per protocol

## 2017-02-21 ENCOUNTER — Ambulatory Visit (INDEPENDENT_AMBULATORY_CARE_PROVIDER_SITE_OTHER): Payer: Medicare Other | Admitting: *Deleted

## 2017-02-21 DIAGNOSIS — Z5181 Encounter for therapeutic drug level monitoring: Secondary | ICD-10-CM | POA: Diagnosis not present

## 2017-02-21 DIAGNOSIS — I480 Paroxysmal atrial fibrillation: Secondary | ICD-10-CM

## 2017-02-21 LAB — POCT INR: INR: 2

## 2017-03-04 ENCOUNTER — Ambulatory Visit: Payer: Medicare Other | Admitting: Internal Medicine

## 2017-03-04 ENCOUNTER — Encounter (HOSPITAL_COMMUNITY): Payer: Self-pay

## 2017-03-04 ENCOUNTER — Ambulatory Visit (HOSPITAL_COMMUNITY)
Admission: RE | Admit: 2017-03-04 | Discharge: 2017-03-04 | Disposition: A | Discharge status: Home / Self Care | Payer: Medicare Other | Source: Ambulatory Visit | Attending: Cardiology | Admitting: Cardiology

## 2017-03-04 VITALS — BP 120/66 | HR 72 | Wt 289.2 lb

## 2017-03-04 DIAGNOSIS — K219 Gastro-esophageal reflux disease without esophagitis: Secondary | ICD-10-CM | POA: Insufficient documentation

## 2017-03-04 DIAGNOSIS — E669 Obesity, unspecified: Secondary | ICD-10-CM | POA: Insufficient documentation

## 2017-03-04 DIAGNOSIS — G4733 Obstructive sleep apnea (adult) (pediatric): Secondary | ICD-10-CM | POA: Insufficient documentation

## 2017-03-04 DIAGNOSIS — I480 Paroxysmal atrial fibrillation: Secondary | ICD-10-CM | POA: Insufficient documentation

## 2017-03-04 DIAGNOSIS — I130 Hypertensive heart and chronic kidney disease with heart failure and stage 1 through stage 4 chronic kidney disease, or unspecified chronic kidney disease: Secondary | ICD-10-CM | POA: Diagnosis not present

## 2017-03-04 DIAGNOSIS — I442 Atrioventricular block, complete: Secondary | ICD-10-CM | POA: Diagnosis not present

## 2017-03-04 DIAGNOSIS — Z6836 Body mass index (BMI) 36.0-36.9, adult: Secondary | ICD-10-CM | POA: Insufficient documentation

## 2017-03-04 DIAGNOSIS — J449 Chronic obstructive pulmonary disease, unspecified: Secondary | ICD-10-CM | POA: Diagnosis not present

## 2017-03-04 DIAGNOSIS — Z79899 Other long term (current) drug therapy: Secondary | ICD-10-CM | POA: Insufficient documentation

## 2017-03-04 DIAGNOSIS — M109 Gout, unspecified: Secondary | ICD-10-CM | POA: Insufficient documentation

## 2017-03-04 DIAGNOSIS — Z9981 Dependence on supplemental oxygen: Secondary | ICD-10-CM | POA: Insufficient documentation

## 2017-03-04 DIAGNOSIS — I519 Heart disease, unspecified: Secondary | ICD-10-CM | POA: Diagnosis not present

## 2017-03-04 DIAGNOSIS — Z9889 Other specified postprocedural states: Secondary | ICD-10-CM | POA: Insufficient documentation

## 2017-03-04 DIAGNOSIS — I5022 Chronic systolic (congestive) heart failure: Secondary | ICD-10-CM | POA: Diagnosis not present

## 2017-03-04 DIAGNOSIS — Z87891 Personal history of nicotine dependence: Secondary | ICD-10-CM | POA: Insufficient documentation

## 2017-03-04 DIAGNOSIS — Z95810 Presence of automatic (implantable) cardiac defibrillator: Secondary | ICD-10-CM | POA: Diagnosis not present

## 2017-03-04 DIAGNOSIS — Z86718 Personal history of other venous thrombosis and embolism: Secondary | ICD-10-CM | POA: Insufficient documentation

## 2017-03-04 DIAGNOSIS — Z7901 Long term (current) use of anticoagulants: Secondary | ICD-10-CM | POA: Diagnosis not present

## 2017-03-04 DIAGNOSIS — N183 Chronic kidney disease, stage 3 unspecified: Secondary | ICD-10-CM

## 2017-03-04 DIAGNOSIS — I428 Other cardiomyopathies: Secondary | ICD-10-CM | POA: Diagnosis not present

## 2017-03-04 LAB — BASIC METABOLIC PANEL
Anion gap: 10 (ref 5–15)
BUN: 36 mg/dL — AB (ref 6–20)
CHLORIDE: 99 mmol/L — AB (ref 101–111)
CO2: 33 mmol/L — AB (ref 22–32)
Calcium: 9.5 mg/dL (ref 8.9–10.3)
Creatinine, Ser: 2.29 mg/dL — ABNORMAL HIGH (ref 0.61–1.24)
GFR calc Af Amer: 29 mL/min — ABNORMAL LOW (ref >60)
GFR calc non Af Amer: 25 mL/min — ABNORMAL LOW (ref >60)
Glucose, Bld: 116 mg/dL — ABNORMAL HIGH (ref 65–99)
POTASSIUM: 4.1 mmol/L (ref 3.5–5.1)
SODIUM: 142 mmol/L (ref 135–145)

## 2017-03-04 NOTE — Patient Instructions (Addendum)
Routine lab work today. Will notify you of abnormal results, otherwise no news is good news!  No changes to medication at this time.  Follow up 3 months with Dr. Aundra Dubin. Take all medication as prescribed the day of your appointment. Bring all medications with you to your appointment.  ______________________________________________________________________  ______________________________________________________________________  Do the following things EVERYDAY: 1) Weigh yourself in the morning before breakfast. Write it down and keep it in a log. 2) Take your medicines as prescribed 3) Eat low salt foods-Limit salt (sodium) to 2000 mg per day.  4) Stay as active as you can everyday 5) Limit all fluids for the day to less than 2 liters

## 2017-03-04 NOTE — Progress Notes (Signed)
Patient ID: Maurice Delgado, male   DOB: 17-Sep-1933, 83 y.o.   MRN: [id number]    Advanced Heart Failure Clinic Note   PCP: Dr. Jonelle Sidle HF Cardiology: Dr Aundra Dubin Nephrology: Dr Deterding  [name] is a 83 y.o. male with history of chronic systolic CHF due to NICM, CHB s/p Medtronic CRT-D 2002 with generator change 2011, HTN, history of DVT on chronic anticoag with coumadin, COPD on home O2, PAF, CKD stage III.  He went into atrial fibrillation in 7/17 and had TEE-guided DCCV.    He returns today for HF follow up. He is feeling well. Continues to be SOB with little activity, however this is his baseline and has not worsened. He has chronic 2 pillow orthopnea. Taking all medications as prescribed. Not weighing daily, does not really watch salt intake. Drinking more than 2L a day on some days.   Labs (1/17): K 3.6, creatinine 1.56 Labs 07/20/2015: K 4.5 Creatinine 2.33, digoxin 0.5  Labs (2/17): K 4.3, creatinine 2.24 Labs (3/17) K, 4.4, Creatinine 1.73, Dig 0.5 Lab (10/12/2015): INR 2.4  Labs (10/17/2015): K 4.1 Creatinine 1.77 Labs (01/2016): dig level 0.5 K 4.2 Creatinine 1.44 Labs (10/17): K 3.8, creatinine 1.71 Labs (3/18): digoxin 0.3 Labs (4/18): K 4.3, creatinine 1.8 Labs (6/18): K 4.1, creatinine 2.02  ECG: AV paced. QTC .499   PMH: 1. Sciatica 2. H/o DVT: Recurrent.  Has IVC filter 3. Complete heart block: Medtronic CRT-D device placed 2002.  4. Atrial fibrillation: Paroxysmal. On sotalol to maintain NSR.  - TEE-guided DCCV in 7/17.  5. OSA: No longer uses CPAP.  6. Chronic systolic CHF: Echo (6/30) with EF 20-25%, mildly dilated LV, normal RV size and systolic function, mild MR.  Nonischemic cardiomyopathy.  Has MDT CRT-D device.  - Hypotension with low dose Bidil.  - TEE (7/17): EF 30%, diffuse hypokinesis, mildly dilated RV with mildly decreased systolic function.  7. COPD: On home oxygen.  8. History of retroperitoneal hemorrhage.  9. CKD: Stage III.  10. Gout 11.  GERD 12. Atrial tachycardia: s/p ablation 2014.  13. Sleep study negative 14. ABIs (2/18): Normal.   Social History   Social History  . Marital status: Married    Spouse name: N/A  . Number of children: N/A  . Years of education: N/A   Occupational History  . reitred     from education   Social History Main Topics  . Smoking status: Former Smoker    Packs/day: 1.50    Years: 30.00    Types: Cigarettes  . Smokeless tobacco: Never Used     Comment: 06/21/2013 "quit smoking ~ 1986"  . Alcohol use No     Comment: 06/21/2013 "quit drinking ~ 1970"  . Drug use: Yes    Types: Marijuana     Comment: 06/21/2013 "stopped using marijuana ~ 1970"  . Sexual activity: Not Currently   Other Topics Concern  . Not on file   Social History Narrative   Married and has 1 son.    Lives in [place].  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   Family History  Problem Relation Age of Onset  . Heart disease Mother        mother also had Rheumatism.  Marland Kitchen Hypertension Mother   . Heart attack Neg Hx   . Stroke Neg Hx    ROS: All systems reviewed and negative except as per HPI.   Current Outpatient Prescriptions  Medication Sig Dispense Refill  . Ascorbic Acid (VITAMIN C) 1000 MG  tablet Take 1,000 mg by mouth every morning.     . calcitRIOL (ROCALTROL) 0.5 MCG capsule Take 0.5 mcg by mouth every other day.     . carvedilol (COREG) 6.25 MG tablet Take 1 tablet (6.25 mg total) by mouth 2 (two) times daily. 180 tablet 3  . digoxin (DIGITEK) 0.125 MG tablet Take 1 tablet (125 mcg total) by mouth every morning. 90 tablet 3  . Febuxostat (ULORIC) 80 MG TABS Take 80 mg by mouth every morning.     . pregabalin (LYRICA) 150 MG capsule Take 150 mg by mouth 2 (two) times daily.      . sotalol (BETAPACE) 160 MG tablet Take 1 tablet (160 mg total) by mouth daily. 90 tablet 3  . spironolactone (ALDACTONE) 25 MG tablet Take 0.5 tablets (12.5 mg total) by mouth daily. 45 tablet 3  . Tamsulosin HCl (FLOMAX) 0.4 MG CAPS  Take 0.4 mg by mouth every morning.     . torsemide (DEMADEX) 20 MG tablet Take 4 tablets (80 mg) in the morning and 3 tablets (60 mg) in the afternoon 630 tablet 3  . umeclidinium-vilanterol (ANORO ELLIPTA) 62.5-25 MCG/INH AEPB TAKE 1 PUFF BY MOUTH EVERY DAY 180 each 1  . warfarin (COUMADIN) 5 MG tablet Take as directed by coumadin clinic 150 tablet 0   No current facility-administered medications for this encounter.    BP 120/66   Pulse 72   Wt 289 lb 3.2 oz (131.2 kg)   SpO2 95% Comment: 2L  BMI 36.15 kg/m    Wt Readings from Last 3 Encounters:  03/04/17 289 lb 3.2 oz (131.2 kg)  12/02/16 295 lb (133.8 kg)  11/04/16 292 lb 6.4 oz (132.6 kg)    General: Obese male, NAD. Ambulates with walker.  Neck: Supple. JVP 7-8 cm.  Lungs: Diminished in all lobes. Normal effort.    CV: Nondisplaced PMI.  Heart regular rate and rhythm.    Abdomen: Obese, soft, non tender, non distended. no HSM. No bruits or masses. +BS  Skin: Intact without lesions or rashes.  Neurologic: Alert and oriented x 3.  Psych: Normal affect. Extremities: No clubbing or cyanosis.  HEENT: Normal.   Assessment/Plan: 1. Atrial fibrillation: Paroxysmal. S/p TEE/DCCV 12/2015.  - AV paced. Continue sotalol. QTC .499. He is allergic to amiodarone.  - On warfarin, continue same.   2. COPD: Stable. Follows with Dr. Halford Chessman - Continue home O2.   3. Chronic systolic CHF: EF 51-88%, nonischemic cardiomyopathy.  MDT CRT-D device. - Chronic NYHA III symptoms. - Volume ok on exam.  - Device interrogation shows less than one hour of activity a day.  - Continue torsemide 80 mg in the am/60 mg in the pm.  - has not tolerated Bidil with hypotension in the past.  - Continue digoxin  - No ARB with CKD.  - Continue Coreg 6.25 mg BID.  - Continue Spiro 12.5 mg hs. Will not increase with CKD.   4. CKD: Stage III: - BMET today.   5. Hx of DVT:  - On warfarin as above.   Follow up in 3 months with Dr. Aundra Dubin.   Arbutus Leas,  NP  03/04/2017

## 2017-03-05 ENCOUNTER — Encounter (HOSPITAL_COMMUNITY): Payer: Self-pay

## 2017-03-05 NOTE — Progress Notes (Signed)
Recent BMET from yesterday's CHF clinic OV with Jettie Booze NP-C routed to Alric Seton PA-C with [facility] for FYI/review as mutual patient.  Renee Pain, RN

## 2017-03-06 DIAGNOSIS — H04203 Unspecified epiphora, bilateral lacrimal glands: Secondary | ICD-10-CM | POA: Diagnosis not present

## 2017-03-06 DIAGNOSIS — Z9842 Cataract extraction status, left eye: Secondary | ICD-10-CM | POA: Diagnosis not present

## 2017-03-06 DIAGNOSIS — Z9841 Cataract extraction status, right eye: Secondary | ICD-10-CM | POA: Diagnosis not present

## 2017-03-06 DIAGNOSIS — H16213 Exposure keratoconjunctivitis, bilateral: Secondary | ICD-10-CM | POA: Diagnosis not present

## 2017-03-10 ENCOUNTER — Ambulatory Visit: Payer: Medicare Other

## 2017-03-10 ENCOUNTER — Ambulatory Visit (INDEPENDENT_AMBULATORY_CARE_PROVIDER_SITE_OTHER): Payer: Medicare Other | Admitting: Podiatry

## 2017-03-10 ENCOUNTER — Encounter: Payer: Self-pay | Admitting: Podiatry

## 2017-03-10 VITALS — BP 99/63 | HR 76 | Resp 16

## 2017-03-10 DIAGNOSIS — M79675 Pain in left toe(s): Secondary | ICD-10-CM

## 2017-03-10 DIAGNOSIS — B351 Tinea unguium: Secondary | ICD-10-CM | POA: Diagnosis not present

## 2017-03-10 DIAGNOSIS — R52 Pain, unspecified: Secondary | ICD-10-CM

## 2017-03-10 DIAGNOSIS — M79674 Pain in right toe(s): Secondary | ICD-10-CM

## 2017-03-10 NOTE — Progress Notes (Signed)
   Subjective:    Patient ID: Maurice Delgado, male    DOB: 20-May-1934, 83 y.o.   MRN: [id number]  HPI 83 year old male presents the also concerns of thick, painful, elongated toenails that he cannot trim himself. His been ongoing for several years. Denies any swelling or redness or drainage and the toenail sites. He states the nails to occasionally bleed may try as a cut them. He denies any open sores. He has no other concerns.   Review of Systems  HENT: Positive for hearing loss.   Respiratory: Positive for shortness of breath.   Cardiovascular: Positive for leg swelling.  Gastrointestinal: Positive for abdominal distention.  Endocrine: Positive for polyuria.  Genitourinary: Positive for frequency.  Musculoskeletal: Positive for gait problem.  Neurological: Positive for weakness.  Hematological: Bruises/bleeds easily.  All other systems reviewed and are negative.      Objective:   Physical Exam General: AAO x3, NAD  Dermatological: Nails are hypertrophic, dystrophic, brittle, discolored, elongated 10, particularly the left hallux toenail. No surrounding redness or drainage. Tenderness nails 1-5 bilaterally. No open lesions or pre-ulcerative lesions are identified today.  Vascular: Dorsalis Pedis artery and Posterior Tibial artery pedal pulses are 2/4 bilateral with immedate capillary fill time.  There is no pain with calf compression, swelling, warmth, erythema.   Neruologic: Grossly intact via light touch bilateral.  Protective threshold with Semmes Wienstein monofilament intact to all pedal sites bilateral.   Musculoskeletal: No gross boney pedal deformities bilateral. No pain, crepitus, or limitation noted with foot and ankle range of motion bilateral. Muscular strength 5/5 in all groups tested bilateral.     Assessment & Plan:  83 year old male with symptomatic onychomycosis -Treatment options discussed including all alternatives, risks, and complications -Etiology of  symptoms were discussed -Nails debrided 10 without complications or bleeding. -He was signed treatment for the nails. I prescribed a urea cream to help thin the nail and a topical antifungal. Discussed success rates.  -Daily foot inspection -Follow-up in 3 months for routine care or sooner if any problems arise. In the meantime, encouraged to call the office with any questions, concerns, change in symptoms.   Celesta Gentile, DPM

## 2017-03-11 ENCOUNTER — Encounter: Payer: Medicare Other | Admitting: Internal Medicine

## 2017-03-13 ENCOUNTER — Ambulatory Visit (INDEPENDENT_AMBULATORY_CARE_PROVIDER_SITE_OTHER): Payer: Medicare Other | Admitting: *Deleted

## 2017-03-13 DIAGNOSIS — Z5181 Encounter for therapeutic drug level monitoring: Secondary | ICD-10-CM | POA: Diagnosis not present

## 2017-03-13 DIAGNOSIS — I480 Paroxysmal atrial fibrillation: Secondary | ICD-10-CM

## 2017-03-13 DIAGNOSIS — N183 Chronic kidney disease, stage 3 (moderate): Secondary | ICD-10-CM | POA: Diagnosis not present

## 2017-03-13 LAB — POCT INR: INR: 2.4

## 2017-04-14 ENCOUNTER — Ambulatory Visit (INDEPENDENT_AMBULATORY_CARE_PROVIDER_SITE_OTHER): Payer: Medicare Other | Admitting: *Deleted

## 2017-04-14 DIAGNOSIS — I428 Other cardiomyopathies: Secondary | ICD-10-CM

## 2017-04-14 NOTE — Progress Notes (Signed)
Remote ICD transmission.   

## 2017-04-16 LAB — CUP PACEART REMOTE DEVICE CHECK
Battery Remaining Longevity: 51 mo
Brady Statistic AP VS Percent: 0.11 %
Brady Statistic AS VP Percent: 3.48 %
Brady Statistic AS VS Percent: 0.02 %
Brady Statistic RA Percent Paced: 95.85 %
Date Time Interrogation Session: 20181015062605
HIGH POWER IMPEDANCE MEASURED VALUE: 48 Ohm
HighPow Impedance: 66 Ohm
Implantable Lead Implant Date: 20020325
Implantable Lead Implant Date: 20020325
Implantable Lead Location: 753858
Implantable Lead Location: 753860
Implantable Lead Model: 6945
Implantable Pulse Generator Implant Date: 20160301
Lead Channel Impedance Value: 266 Ohm
Lead Channel Impedance Value: 266 Ohm
Lead Channel Impedance Value: 418 Ohm
Lead Channel Impedance Value: 684 Ohm
Lead Channel Pacing Threshold Amplitude: 1 V
Lead Channel Pacing Threshold Pulse Width: 0.4 ms
Lead Channel Sensing Intrinsic Amplitude: 1.5 mV
Lead Channel Sensing Intrinsic Amplitude: 1.5 mV
Lead Channel Setting Pacing Amplitude: 2.5 V
Lead Channel Setting Pacing Pulse Width: 0.4 ms
Lead Channel Setting Sensing Sensitivity: 0.3 mV
MDC IDC LEAD IMPLANT DT: 20020325
MDC IDC LEAD LOCATION: 753859
MDC IDC MSMT BATTERY VOLTAGE: 2.93 V
MDC IDC MSMT LEADCHNL LV IMPEDANCE VALUE: 475 Ohm
MDC IDC MSMT LEADCHNL RA IMPEDANCE VALUE: 418 Ohm
MDC IDC MSMT LEADCHNL RV PACING THRESHOLD AMPLITUDE: 1 V
MDC IDC MSMT LEADCHNL RV PACING THRESHOLD PULSEWIDTH: 0.4 ms
MDC IDC MSMT LEADCHNL RV SENSING INTR AMPL: 16.75 mV
MDC IDC MSMT LEADCHNL RV SENSING INTR AMPL: 16.75 mV
MDC IDC SET LEADCHNL LV PACING AMPLITUDE: 1.5 V
MDC IDC SET LEADCHNL LV PACING PULSEWIDTH: 0.5 ms
MDC IDC SET LEADCHNL RA PACING AMPLITUDE: 2 V
MDC IDC STAT BRADY AP VP PERCENT: 96.4 %
MDC IDC STAT BRADY RV PERCENT PACED: 98.82 %

## 2017-04-17 ENCOUNTER — Encounter (INDEPENDENT_AMBULATORY_CARE_PROVIDER_SITE_OTHER): Payer: Self-pay

## 2017-04-17 ENCOUNTER — Ambulatory Visit (INDEPENDENT_AMBULATORY_CARE_PROVIDER_SITE_OTHER): Payer: Medicare Other | Admitting: *Deleted

## 2017-04-17 DIAGNOSIS — Z5181 Encounter for therapeutic drug level monitoring: Secondary | ICD-10-CM | POA: Diagnosis not present

## 2017-04-17 DIAGNOSIS — I480 Paroxysmal atrial fibrillation: Secondary | ICD-10-CM | POA: Diagnosis not present

## 2017-04-17 LAB — POCT INR: INR: 2.8

## 2017-04-18 ENCOUNTER — Encounter: Payer: Self-pay | Admitting: Cardiology

## 2017-04-21 ENCOUNTER — Other Ambulatory Visit: Payer: Self-pay | Admitting: *Deleted

## 2017-04-21 MED ORDER — WARFARIN SODIUM 5 MG PO TABS: ORAL_TABLET | ORAL | 0 refills | Status: DC

## 2017-04-21 MED ORDER — WARFARIN SODIUM 5 MG PO TABS: ORAL_TABLET | ORAL | 1 refills | Status: AC

## 2017-04-21 MED ORDER — WARFARIN SODIUM 5 MG PO TABS: ORAL_TABLET | ORAL | 1 refills | Status: DC

## 2017-04-21 NOTE — Telephone Encounter (Addendum)
Pt called to report he needed a refill sent locally since he only has 1/2 tablet left & a 90 day supply via mail order sent. Mail order sent to [facility] initially then called pt to confirm which mail order after noticing he had two in their, confirmed that it should be [facility]. Called [facility] & canceled refill & refill sent to [facility] per pt request.

## 2017-04-30 ENCOUNTER — Other Ambulatory Visit: Payer: Self-pay | Admitting: Cardiology

## 2017-05-29 ENCOUNTER — Ambulatory Visit (INDEPENDENT_AMBULATORY_CARE_PROVIDER_SITE_OTHER): Payer: Medicare Other | Admitting: *Deleted

## 2017-05-29 DIAGNOSIS — I480 Paroxysmal atrial fibrillation: Secondary | ICD-10-CM

## 2017-05-29 DIAGNOSIS — Z86718 Personal history of other venous thrombosis and embolism: Secondary | ICD-10-CM | POA: Diagnosis not present

## 2017-05-29 DIAGNOSIS — Z5181 Encounter for therapeutic drug level monitoring: Secondary | ICD-10-CM | POA: Diagnosis not present

## 2017-05-29 LAB — POCT INR: INR: 1.8

## 2017-05-29 NOTE — Patient Instructions (Signed)
Today Nov 29th take 1.5 tablets then continue taking 1 tablet daily every day except 1.5 tablets on Mondays, Wednesday, and Friday.  Keep dark green leafy veggies consistent weekly.  Recheck 2 weeks.  Call us with any new medications [phone].

## 2017-06-02 ENCOUNTER — Encounter (HOSPITAL_COMMUNITY): Payer: Self-pay | Admitting: Cardiology

## 2017-06-02 ENCOUNTER — Ambulatory Visit (HOSPITAL_COMMUNITY)
Admission: RE | Admit: 2017-06-02 | Discharge: 2017-06-02 | Disposition: A | Discharge status: Home / Self Care | Payer: Medicare Other | Source: Ambulatory Visit | Attending: Cardiology | Admitting: Cardiology

## 2017-06-02 VITALS — BP 96/70 | HR 86 | Wt 290.0 lb

## 2017-06-02 DIAGNOSIS — I130 Hypertensive heart and chronic kidney disease with heart failure and stage 1 through stage 4 chronic kidney disease, or unspecified chronic kidney disease: Secondary | ICD-10-CM | POA: Insufficient documentation

## 2017-06-02 DIAGNOSIS — Z9889 Other specified postprocedural states: Secondary | ICD-10-CM | POA: Diagnosis not present

## 2017-06-02 DIAGNOSIS — Z7901 Long term (current) use of anticoagulants: Secondary | ICD-10-CM | POA: Insufficient documentation

## 2017-06-02 DIAGNOSIS — K219 Gastro-esophageal reflux disease without esophagitis: Secondary | ICD-10-CM | POA: Diagnosis not present

## 2017-06-02 DIAGNOSIS — N183 Chronic kidney disease, stage 3 unspecified: Secondary | ICD-10-CM

## 2017-06-02 DIAGNOSIS — M109 Gout, unspecified: Secondary | ICD-10-CM | POA: Diagnosis not present

## 2017-06-02 DIAGNOSIS — I519 Heart disease, unspecified: Secondary | ICD-10-CM | POA: Diagnosis not present

## 2017-06-02 DIAGNOSIS — Z86718 Personal history of other venous thrombosis and embolism: Secondary | ICD-10-CM | POA: Diagnosis not present

## 2017-06-02 DIAGNOSIS — Z9981 Dependence on supplemental oxygen: Secondary | ICD-10-CM | POA: Insufficient documentation

## 2017-06-02 DIAGNOSIS — Z79899 Other long term (current) drug therapy: Secondary | ICD-10-CM | POA: Insufficient documentation

## 2017-06-02 DIAGNOSIS — I5022 Chronic systolic (congestive) heart failure: Secondary | ICD-10-CM | POA: Diagnosis not present

## 2017-06-02 DIAGNOSIS — I480 Paroxysmal atrial fibrillation: Secondary | ICD-10-CM | POA: Diagnosis not present

## 2017-06-02 DIAGNOSIS — Z95810 Presence of automatic (implantable) cardiac defibrillator: Secondary | ICD-10-CM | POA: Diagnosis not present

## 2017-06-02 DIAGNOSIS — Z87891 Personal history of nicotine dependence: Secondary | ICD-10-CM | POA: Diagnosis not present

## 2017-06-02 DIAGNOSIS — J449 Chronic obstructive pulmonary disease, unspecified: Secondary | ICD-10-CM | POA: Diagnosis not present

## 2017-06-02 DIAGNOSIS — I428 Other cardiomyopathies: Secondary | ICD-10-CM | POA: Diagnosis not present

## 2017-06-02 DIAGNOSIS — G4733 Obstructive sleep apnea (adult) (pediatric): Secondary | ICD-10-CM | POA: Diagnosis not present

## 2017-06-02 LAB — BASIC METABOLIC PANEL
ANION GAP: 10 (ref 5–15)
BUN: 41 mg/dL — ABNORMAL HIGH (ref 6–20)
CHLORIDE: 101 mmol/L (ref 101–111)
CO2: 30 mmol/L (ref 22–32)
Calcium: 9.4 mg/dL (ref 8.9–10.3)
Creatinine, Ser: 2.14 mg/dL — ABNORMAL HIGH (ref 0.61–1.24)
GFR calc Af Amer: 32 mL/min — ABNORMAL LOW (ref >60)
GFR calc non Af Amer: 27 mL/min — ABNORMAL LOW (ref >60)
GLUCOSE: 103 mg/dL — AB (ref 65–99)
POTASSIUM: 4.2 mmol/L (ref 3.5–5.1)
Sodium: 141 mmol/L (ref 135–145)

## 2017-06-02 LAB — CBC
HCT: 40.2 % (ref 39.0–52.0)
HEMOGLOBIN: 12.2 g/dL — AB (ref 13.0–17.0)
MCH: 27.1 pg (ref 26.0–34.0)
MCHC: 30.3 g/dL (ref 30.0–36.0)
MCV: 89.3 fL (ref 78.0–100.0)
Platelets: 136 10*3/uL — ABNORMAL LOW (ref 150–400)
RBC: 4.5 MIL/uL (ref 4.22–5.81)
RDW: 15.9 % — ABNORMAL HIGH (ref 11.5–15.5)
WBC: 5.2 10*3/uL (ref 4.0–10.5)

## 2017-06-02 LAB — DIGOXIN LEVEL: Digoxin Level: 0.4 ng/mL — ABNORMAL LOW (ref 0.8–2.0)

## 2017-06-02 NOTE — Progress Notes (Signed)
Patient ID: Maurice Delgado, male   DOB: [birth year], 83 y.o.   MRN: [id number]    Advanced Heart Failure Clinic Note   PCP: Dr. Jonelle Delgado HF Cardiology: Dr Maurice Delgado Nephrology: Dr Maurice Delgado  Maurice Delgado is a 83 y.o. male with history of chronic systolic CHF due to NICM, CHB s/p Medtronic CRT-D 2002 with generator change 2011, HTN, history of DVT on chronic anticoag with coumadin, COPD on home O2, PAF, CKD stage III.  He went into atrial fibrillation in 7/17 and had TEE-guided DCCV.    Patient returns for followup of CHF.  He is not in atrial fibrillation today (a-paced).  He is very inactive.  He has stable dyspnea when he walks long distances (to mailbox) but does ok around the house.  Short of breath with stairs.  No palpitations.  No chest pain. No orthopnea/PND.  Weight stable.   Medtronic device interrogated: fluid index < threshold, stable impedance, rare atrial fibrillation (short episodes), >99% BiV pacing, minimal daily activity (15 min).   Labs (1/17): K 3.6, creatinine 1.56 Labs 07/20/2015: K 4.5 Creatinine 2.33, digoxin 0.5  Labs (2/17): K 4.3, creatinine 2.24 Labs (3/17) K, 4.4, Creatinine 1.73, Dig 0.5 Lab (10/12/2015): INR 2.4  Labs (10/17/2015): K 4.1 Creatinine 1.77 Labs (01/2016): dig level 0.5 K 4.2 Creatinine 1.44 Labs (10/17): K 3.8, creatinine 1.71 Labs (3/18): digoxin 0.3 Labs (4/18): K 4.3, creatinine 1.8 Labs (6/18): digoxin 0.7 Labs (9/18): K 4.1, creatinine 2.29  ECG: Personally reviewed, a-paced, BiV paced, QTc 501 msec  PMH: 1. Sciatica 2. H/o DVT: Recurrent.  Has IVC filter 3. Complete heart block: Medtronic CRT-D device placed 2002.  4. Atrial fibrillation: Paroxysmal. On sotalol to maintain NSR.  - TEE-guided DCCV in 7/17.  5. OSA: No longer uses CPAP.  6. Chronic systolic CHF: Echo (3/87) with EF 20-25%, mildly dilated LV, normal RV size and systolic function, mild MR.  Nonischemic cardiomyopathy.  Has MDT CRT-D device.  - Hypotension with low dose  Bidil.  - TEE (7/17): EF 30%, diffuse hypokinesis, mildly dilated RV with mildly decreased systolic function.  7. COPD: On home oxygen.  8. History of retroperitoneal hemorrhage.  9. CKD: Stage III.  10. Gout 11. GERD 12. Atrial tachycardia: s/p ablation 2014.  13. Sleep study negative 14. ABIs (2/18): Normal.   Social History   Socioeconomic History  . Marital status: Married    Spouse name: Not on file  . Number of children: Not on file  . Years of education: Not on file  . Highest education level: Not on file  Social Needs  . Financial resource strain: Not on file  . Food insecurity - worry: Not on file  . Food insecurity - inability: Not on file  . Transportation needs - medical: Not on file  . Transportation needs - non-medical: Not on file  Occupational History  . Occupation: reitred    Comment: from education  Tobacco Use  . Smoking status: Former Smoker    Packs/day: 1.50    Years: 30.00    Pack years: 45.00    Types: Cigarettes  . Smokeless tobacco: Never Used  . Tobacco comment: 06/21/2013 "quit smoking ~ 1986"  Substance and Sexual Activity  . Alcohol use: No    Comment: 06/21/2013 "quit drinking ~ 1970"  . Drug use: Yes    Types: Marijuana    Comment: 06/21/2013 "stopped using marijuana ~ 1970"  . Sexual activity: Not Currently  Other Topics Concern  . Not on file  Social  History Narrative   Married and has 1 son.    Lives in [place].  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   Family History  Problem Relation Age of Onset  . Heart disease Mother        mother also had Rheumatism.  Marland Kitchen Hypertension Mother   . Heart attack Neg Hx   . Stroke Neg Hx    ROS: All systems reviewed and negative except as per HPI.   Current Outpatient Medications  Medication Sig Dispense Refill  . Ascorbic Acid (VITAMIN C) 1000 MG tablet Take 1,000 mg by mouth every morning.     . calcitRIOL (ROCALTROL) 0.5 MCG capsule Take 0.5 mcg by mouth every other day.     . carvedilol  (COREG) 6.25 MG tablet Take 1 tablet (6.25 mg total) by mouth 2 (two) times daily. 180 tablet 3  . digoxin (DIGITEK) 0.125 MG tablet Take 1 tablet (125 mcg total) by mouth every morning. 90 tablet 3  . Febuxostat (ULORIC) 80 MG TABS Take 80 mg by mouth every morning.     Salley Scarlet FOR[facility]  Onychomycosis Nail Lacquer -  Fluconazole 2%, Terbinafine 1% DMSO Apply to affected nail once daily Qty. 120 gm 3 refills    . NON FORMULARY [facility]  Urea Cream - 40% Urea Apply 1-2 grams to affected area 3-4 times daily Qty. 120 gm 3 refills    . OXYGEN Inhale into the lungs. Is on Oxygen all day long; uses 2 Liters a day    . pregabalin (LYRICA) 150 MG capsule Take 150 mg by mouth 2 (two) times daily.      . sotalol (BETAPACE) 160 MG tablet Take 1 tablet (160 mg total) by mouth daily. 90 tablet 3  . spironolactone (ALDACTONE) 25 MG tablet Take 0.5 tablets (12.5 mg total) by mouth daily. 45 tablet 3  . Tamsulosin HCl (FLOMAX) 0.4 MG CAPS Take 0.4 mg by mouth every morning.     . torsemide (DEMADEX) 20 MG tablet Take 4 tablets (80 mg) in the morning and 3 tablets (60 mg) in the afternoon 630 tablet 3  . umeclidinium-vilanterol (ANORO ELLIPTA) 62.5-25 MCG/INH AEPB TAKE 1 PUFF BY MOUTH EVERY DAY 180 each 1  . warfarin (COUMADIN) 5 MG tablet Take as directed by coumadin clinic 150 tablet 1   No current facility-administered medications for this encounter.    BP 96/70 (BP Location: Left Arm, Patient Position: Sitting, Cuff Size: Large)   Pulse 86   Wt 290 lb (131.5 kg)   SpO2 92%   BMI 36.25 kg/m    Wt Readings from Last 3 Encounters:  06/02/17 290 lb (131.5 kg)  03/04/17 289 lb 3.2 oz (131.2 kg)  12/02/16 295 lb (133.8 kg)   General: NAD Neck: No JVD, no thyromegaly or thyroid nodule.  Lungs: Clear to auscultation bilaterally with normal respiratory effort. CV: Nondisplaced PMI.  Heart regular S1/S2, no S3/S4, no murmur.  No peripheral edema.  No carotid bruit.  Normal  pedal pulses.  Abdomen: Soft, nontender, no hepatosplenomegaly, no distention.  Skin: Intact without lesions or rashes.  Neurologic: Alert and oriented x 3.  Psych: Normal affect. Extremities: No clubbing or cyanosis.  HEENT: Normal.   Assessment/Plan: 1. Atrial fibrillation: Paroxysmal. TEE-guided DCCV in 7/17. He is a-paced today. Rare AF on device assessment today.  - Continue sotalol, QTc acceptable with paced rhythm. Allergic to amiodarone.  - Continue coumadin per coumadin clinic.   2. COPD: Stable on home 02 2  L via [place]. Likely a significant contributor to his dyspnea.   3. Chronic systolic CHF: EF 67-34%, nonischemic cardiomyopathy.  MDT CRT-D device. NYHA class III symptoms.  He is not volume overloaded by exam or Optivol.  He is minimally active.  - Continue torsemide 80 qam/60 qpm. Check BMET today.     - Has not tolerated Bidil due to symptomatic low BP.  - Continue digoxin 0.125 mg daily. Will check level today.      - No ACEI/ARB for now with elevated creatinine. - Continue Coreg 6.25 mg bid, no room to increase with soft BP.   - Continue spironolactone 12.5 mg daily.   - Needs to get more active, I will refer to cardiac rehab at [facility].  4. CKD: Stage III.   - BMET today.   5. Hx of DVT: Coumadin as above.    Followup in 3 months.   Loralie Champagne, MD  06/02/2017

## 2017-06-02 NOTE — Patient Instructions (Signed)
Labs drawn today (if we do not call you, then your lab work was stable)   You have been referred to Cardiac Rehab at [facility] (they will call you)   Your physician recommends that you schedule a follow-up appointment in: 3 months with Dr. Aundra Dubin

## 2017-06-09 ENCOUNTER — Ambulatory Visit: Payer: Medicare Other | Admitting: Podiatry

## 2017-06-12 ENCOUNTER — Ambulatory Visit (INDEPENDENT_AMBULATORY_CARE_PROVIDER_SITE_OTHER): Payer: Medicare Other | Admitting: *Deleted

## 2017-06-12 DIAGNOSIS — Z5181 Encounter for therapeutic drug level monitoring: Secondary | ICD-10-CM

## 2017-06-12 DIAGNOSIS — I480 Paroxysmal atrial fibrillation: Secondary | ICD-10-CM | POA: Diagnosis not present

## 2017-06-12 LAB — POCT INR: INR: 2.3

## 2017-06-12 NOTE — Patient Instructions (Signed)
Description   Continue taking 1 tablet daily every day except 1.5 tablets on Mondays, Wednesday, and Friday.  Keep dark green leafy veggies consistent weekly.  Recheck 4 weeks.  Call us with any new medications [phone].

## 2017-06-26 ENCOUNTER — Other Ambulatory Visit: Payer: Self-pay | Admitting: Cardiology

## 2017-07-01 ENCOUNTER — Other Ambulatory Visit (HOSPITAL_COMMUNITY): Payer: Self-pay | Admitting: Cardiology

## 2017-07-01 DIAGNOSIS — I5022 Chronic systolic (congestive) heart failure: Secondary | ICD-10-CM

## 2017-07-10 ENCOUNTER — Ambulatory Visit (INDEPENDENT_AMBULATORY_CARE_PROVIDER_SITE_OTHER): Payer: Medicare HMO | Admitting: *Deleted

## 2017-07-10 DIAGNOSIS — Z5181 Encounter for therapeutic drug level monitoring: Secondary | ICD-10-CM

## 2017-07-10 DIAGNOSIS — I480 Paroxysmal atrial fibrillation: Secondary | ICD-10-CM

## 2017-07-10 LAB — POCT INR: INR: 2.2

## 2017-07-10 NOTE — Patient Instructions (Signed)
Description   Continue taking 1 tablet daily every day except 1.5 tablets on Mondays, Wednesdays, and Fridays.  Keep dark green leafy veggies consistent weekly.  Recheck 4 weeks.  Call us with any new medications [phone].

## 2017-07-13 ENCOUNTER — Other Ambulatory Visit (HOSPITAL_COMMUNITY): Payer: Self-pay | Admitting: Cardiology

## 2017-07-14 ENCOUNTER — Encounter: Payer: Medicare HMO | Admitting: *Deleted

## 2017-07-17 ENCOUNTER — Encounter: Payer: Self-pay | Admitting: Cardiology

## 2017-07-29 ENCOUNTER — Inpatient Hospital Stay (HOSPITAL_COMMUNITY)
Admission: EM | Admit: 2017-07-29 | Discharge: 2017-08-04 | DRG: 208 | Disposition: A | Discharge status: Home Health | Payer: Medicare HMO | Attending: Family Medicine | Admitting: Family Medicine

## 2017-07-29 ENCOUNTER — Emergency Department (HOSPITAL_COMMUNITY): Payer: Medicare HMO

## 2017-07-29 ENCOUNTER — Encounter (HOSPITAL_COMMUNITY): Payer: Self-pay | Admitting: Emergency Medicine

## 2017-07-29 ENCOUNTER — Observation Stay (HOSPITAL_COMMUNITY): Payer: Medicare HMO

## 2017-07-29 DIAGNOSIS — G4733 Obstructive sleep apnea (adult) (pediatric): Secondary | ICD-10-CM | POA: Diagnosis present

## 2017-07-29 DIAGNOSIS — Z7901 Long term (current) use of anticoagulants: Secondary | ICD-10-CM

## 2017-07-29 DIAGNOSIS — N184 Chronic kidney disease, stage 4 (severe): Secondary | ICD-10-CM | POA: Diagnosis not present

## 2017-07-29 DIAGNOSIS — Z6836 Body mass index (BMI) 36.0-36.9, adult: Secondary | ICD-10-CM

## 2017-07-29 DIAGNOSIS — G9341 Metabolic encephalopathy: Secondary | ICD-10-CM | POA: Diagnosis present

## 2017-07-29 DIAGNOSIS — D696 Thrombocytopenia, unspecified: Secondary | ICD-10-CM | POA: Diagnosis present

## 2017-07-29 DIAGNOSIS — Z9989 Dependence on other enabling machines and devices: Secondary | ICD-10-CM

## 2017-07-29 DIAGNOSIS — J449 Chronic obstructive pulmonary disease, unspecified: Secondary | ICD-10-CM | POA: Diagnosis present

## 2017-07-29 DIAGNOSIS — R0902 Hypoxemia: Secondary | ICD-10-CM

## 2017-07-29 DIAGNOSIS — E875 Hyperkalemia: Secondary | ICD-10-CM | POA: Diagnosis present

## 2017-07-29 DIAGNOSIS — R069 Unspecified abnormalities of breathing: Secondary | ICD-10-CM

## 2017-07-29 DIAGNOSIS — I10 Essential (primary) hypertension: Secondary | ICD-10-CM | POA: Diagnosis present

## 2017-07-29 DIAGNOSIS — M109 Gout, unspecified: Secondary | ICD-10-CM | POA: Diagnosis present

## 2017-07-29 DIAGNOSIS — R197 Diarrhea, unspecified: Secondary | ICD-10-CM

## 2017-07-29 DIAGNOSIS — J9690 Respiratory failure, unspecified, unspecified whether with hypoxia or hypercapnia: Secondary | ICD-10-CM | POA: Diagnosis present

## 2017-07-29 DIAGNOSIS — R319 Hematuria, unspecified: Secondary | ICD-10-CM

## 2017-07-29 DIAGNOSIS — J9622 Acute and chronic respiratory failure with hypercapnia: Principal | ICD-10-CM | POA: Diagnosis present

## 2017-07-29 DIAGNOSIS — J9621 Acute and chronic respiratory failure with hypoxia: Secondary | ICD-10-CM | POA: Diagnosis present

## 2017-07-29 DIAGNOSIS — Z9289 Personal history of other medical treatment: Secondary | ICD-10-CM

## 2017-07-29 DIAGNOSIS — R112 Nausea with vomiting, unspecified: Secondary | ICD-10-CM | POA: Diagnosis present

## 2017-07-29 DIAGNOSIS — N3001 Acute cystitis with hematuria: Secondary | ICD-10-CM

## 2017-07-29 DIAGNOSIS — R609 Edema, unspecified: Secondary | ICD-10-CM | POA: Diagnosis present

## 2017-07-29 DIAGNOSIS — Z87891 Personal history of nicotine dependence: Secondary | ICD-10-CM

## 2017-07-29 DIAGNOSIS — N390 Urinary tract infection, site not specified: Secondary | ICD-10-CM | POA: Diagnosis present

## 2017-07-29 DIAGNOSIS — E872 Acidosis: Secondary | ICD-10-CM | POA: Diagnosis present

## 2017-07-29 DIAGNOSIS — R55 Syncope and collapse: Secondary | ICD-10-CM | POA: Diagnosis present

## 2017-07-29 DIAGNOSIS — I959 Hypotension, unspecified: Secondary | ICD-10-CM | POA: Diagnosis present

## 2017-07-29 DIAGNOSIS — J9611 Chronic respiratory failure with hypoxia: Secondary | ICD-10-CM | POA: Insufficient documentation

## 2017-07-29 DIAGNOSIS — I130 Hypertensive heart and chronic kidney disease with heart failure and stage 1 through stage 4 chronic kidney disease, or unspecified chronic kidney disease: Secondary | ICD-10-CM | POA: Diagnosis present

## 2017-07-29 DIAGNOSIS — I5043 Acute on chronic combined systolic (congestive) and diastolic (congestive) heart failure: Secondary | ICD-10-CM | POA: Diagnosis present

## 2017-07-29 DIAGNOSIS — Z9119 Patient's noncompliance with other medical treatment and regimen: Secondary | ICD-10-CM

## 2017-07-29 DIAGNOSIS — I248 Other forms of acute ischemic heart disease: Secondary | ICD-10-CM | POA: Diagnosis present

## 2017-07-29 DIAGNOSIS — Z86718 Personal history of other venous thrombosis and embolism: Secondary | ICD-10-CM

## 2017-07-29 DIAGNOSIS — E669 Obesity, unspecified: Secondary | ICD-10-CM | POA: Diagnosis present

## 2017-07-29 DIAGNOSIS — Z950 Presence of cardiac pacemaker: Secondary | ICD-10-CM | POA: Diagnosis present

## 2017-07-29 DIAGNOSIS — N179 Acute kidney failure, unspecified: Secondary | ICD-10-CM | POA: Diagnosis present

## 2017-07-29 DIAGNOSIS — E860 Dehydration: Secondary | ICD-10-CM | POA: Diagnosis present

## 2017-07-29 DIAGNOSIS — Z8249 Family history of ischemic heart disease and other diseases of the circulatory system: Secondary | ICD-10-CM

## 2017-07-29 DIAGNOSIS — I429 Cardiomyopathy, unspecified: Secondary | ICD-10-CM | POA: Diagnosis present

## 2017-07-29 DIAGNOSIS — I878 Other specified disorders of veins: Secondary | ICD-10-CM | POA: Diagnosis present

## 2017-07-29 DIAGNOSIS — I480 Paroxysmal atrial fibrillation: Secondary | ICD-10-CM | POA: Diagnosis present

## 2017-07-29 DIAGNOSIS — E042 Nontoxic multinodular goiter: Secondary | ICD-10-CM | POA: Insufficient documentation

## 2017-07-29 DIAGNOSIS — Z95810 Presence of automatic (implantable) cardiac defibrillator: Secondary | ICD-10-CM

## 2017-07-29 DIAGNOSIS — Z9981 Dependence on supplemental oxygen: Secondary | ICD-10-CM

## 2017-07-29 DIAGNOSIS — J9600 Acute respiratory failure, unspecified whether with hypoxia or hypercapnia: Secondary | ICD-10-CM

## 2017-07-29 HISTORY — DX: Nontoxic multinodular goiter: E04.2

## 2017-07-29 HISTORY — DX: Chronic respiratory failure with hypoxia: J96.11

## 2017-07-29 HISTORY — DX: Chronic kidney disease, stage 4 (severe): N18.4

## 2017-07-29 LAB — BASIC METABOLIC PANEL
Anion gap: 12 (ref 5–15)
BUN: 42 mg/dL — ABNORMAL HIGH (ref 6–20)
CHLORIDE: 99 mmol/L — AB (ref 101–111)
CO2: 29 mmol/L (ref 22–32)
CREATININE: 2.99 mg/dL — AB (ref 0.61–1.24)
Calcium: 9 mg/dL (ref 8.9–10.3)
GFR calc non Af Amer: 18 mL/min — ABNORMAL LOW (ref >60)
GFR, EST AFRICAN AMERICAN: 21 mL/min — AB (ref >60)
Glucose, Bld: 108 mg/dL — ABNORMAL HIGH (ref 65–99)
POTASSIUM: 3.9 mmol/L (ref 3.5–5.1)
SODIUM: 140 mmol/L (ref 135–145)

## 2017-07-29 LAB — PROTIME-INR
INR: 3.35
PROTHROMBIN TIME: 33.7 s — AB (ref 11.4–15.2)

## 2017-07-29 LAB — LACTIC ACID, PLASMA: LACTIC ACID, VENOUS: 2.3 mmol/L — AB (ref 0.5–1.9)

## 2017-07-29 LAB — HEPATIC FUNCTION PANEL
ALT: 14 U/L — ABNORMAL LOW (ref 17–63)
AST: 25 U/L (ref 15–41)
Albumin: 3.1 g/dL — ABNORMAL LOW (ref 3.5–5.0)
Alkaline Phosphatase: 76 U/L (ref 38–126)
BILIRUBIN DIRECT: 0.2 mg/dL (ref 0.1–0.5)
BILIRUBIN INDIRECT: 0.8 mg/dL (ref 0.3–0.9)
Total Bilirubin: 1 mg/dL (ref 0.3–1.2)
Total Protein: 6.5 g/dL (ref 6.5–8.1)

## 2017-07-29 LAB — CBC
HCT: 39.1 % (ref 39.0–52.0)
Hemoglobin: 11.9 g/dL — ABNORMAL LOW (ref 13.0–17.0)
MCH: 27.5 pg (ref 26.0–34.0)
MCHC: 30.4 g/dL (ref 30.0–36.0)
MCV: 90.3 fL (ref 78.0–100.0)
PLATELETS: 92 10*3/uL — AB (ref 150–400)
RBC: 4.33 MIL/uL (ref 4.22–5.81)
RDW: 15.5 % (ref 11.5–15.5)
WBC: 12.7 10*3/uL — AB (ref 4.0–10.5)

## 2017-07-29 LAB — TROPONIN I
TROPONIN I: 0.04 ng/mL — AB (ref <0.03)
Troponin I: 0.04 ng/mL (ref <0.03)

## 2017-07-29 LAB — I-STAT TROPONIN, ED: Troponin i, poc: 0.06 ng/mL (ref 0.00–0.08)

## 2017-07-29 LAB — TSH: TSH: 1.493 u[IU]/mL (ref 0.350–4.500)

## 2017-07-29 LAB — BRAIN NATRIURETIC PEPTIDE: B NATRIURETIC PEPTIDE 5: 242.5 pg/mL — AB (ref 0.0–100.0)

## 2017-07-29 LAB — LIPASE, BLOOD: LIPASE: 45 U/L (ref 11–51)

## 2017-07-29 LAB — GLUCOSE, CAPILLARY: GLUCOSE-CAPILLARY: 110 mg/dL — AB (ref 65–99)

## 2017-07-29 LAB — I-STAT CG4 LACTIC ACID, ED: LACTIC ACID, VENOUS: 2.09 mmol/L — AB (ref 0.5–1.9)

## 2017-07-29 LAB — DIGOXIN LEVEL: Digoxin Level: 0.6 ng/mL — ABNORMAL LOW (ref 0.8–2.0)

## 2017-07-29 LAB — MRSA PCR SCREENING: MRSA BY PCR: NEGATIVE

## 2017-07-29 MED ORDER — DEXTROSE 5 % IV SOLN
1.0000 g | INTRAVENOUS | Status: DC
  Administered 2017-07-29: 1 g via INTRAVENOUS
  Filled 2017-07-29 (×2): qty 10

## 2017-07-29 MED ORDER — CARVEDILOL 6.25 MG PO TABS
6.2500 mg | ORAL_TABLET | Freq: Two times a day (BID) | ORAL | Status: DC
  Administered 2017-07-31: 6.25 mg via ORAL
  Filled 2017-07-29 (×2): qty 1

## 2017-07-29 MED ORDER — TAMSULOSIN HCL 0.4 MG PO CAPS
0.4000 mg | ORAL_CAPSULE | Freq: Every morning | ORAL | Status: DC
  Administered 2017-07-29 – 2017-08-04 (×6): 0.4 mg via ORAL
  Filled 2017-07-29 (×6): qty 1

## 2017-07-29 MED ORDER — ACETAMINOPHEN 650 MG RE SUPP: 650.0000 mg | Freq: Four times a day (QID) | RECTAL | Status: DC | PRN

## 2017-07-29 MED ORDER — CALCITRIOL 0.5 MCG PO CAPS: 0.5000 ug | ORAL_CAPSULE | ORAL | Status: DC

## 2017-07-29 MED ORDER — SODIUM CHLORIDE 0.9 % IV BOLUS (SEPSIS)
250.0000 mL | Freq: Once | INTRAVENOUS | Status: AC
  Administered 2017-07-29: 250 mL via INTRAVENOUS

## 2017-07-29 MED ORDER — ONDANSETRON HCL 4 MG PO TABS: 4.0000 mg | ORAL_TABLET | Freq: Four times a day (QID) | ORAL | Status: DC | PRN

## 2017-07-29 MED ORDER — TRIAMCINOLONE 0.1 % CREAM:EUCERIN CREAM 1:1
TOPICAL_CREAM | Freq: Two times a day (BID) | CUTANEOUS | Status: DC
  Administered 2017-07-29 – 2017-08-04 (×10): via TOPICAL
  Filled 2017-07-29 (×3): qty 1

## 2017-07-29 MED ORDER — SODIUM CHLORIDE 0.9% FLUSH
3.0000 mL | Freq: Two times a day (BID) | INTRAVENOUS | Status: DC
  Administered 2017-07-29 – 2017-08-04 (×10): 3 mL via INTRAVENOUS

## 2017-07-29 MED ORDER — ACETAMINOPHEN 325 MG PO TABS: 650.0000 mg | ORAL_TABLET | Freq: Four times a day (QID) | ORAL | Status: DC | PRN

## 2017-07-29 MED ORDER — FUROSEMIDE 10 MG/ML IJ SOLN
100.0000 mg | INTRAVENOUS | Status: AC
  Administered 2017-07-29: 100 mg via INTRAVENOUS
  Filled 2017-07-29: qty 10

## 2017-07-29 MED ORDER — SODIUM CHLORIDE 0.9 % IV SOLN
INTRAVENOUS | Status: DC
  Administered 2017-07-29 – 2017-07-31 (×4): via INTRAVENOUS

## 2017-07-29 MED ORDER — SOTALOL HCL 80 MG PO TABS
160.0000 mg | ORAL_TABLET | Freq: Every day | ORAL | Status: DC
  Administered 2017-07-31: 160 mg via ORAL
  Filled 2017-07-29 (×3): qty 2

## 2017-07-29 MED ORDER — WARFARIN SODIUM 5 MG PO TABS
5.0000 mg | ORAL_TABLET | Freq: Once | ORAL | Status: AC
  Administered 2017-07-29: 5 mg via ORAL
  Filled 2017-07-29 (×2): qty 1

## 2017-07-29 MED ORDER — UMECLIDINIUM-VILANTEROL 62.5-25 MCG/INH IN AEPB
1.0000 | INHALATION_SPRAY | Freq: Every day | RESPIRATORY_TRACT | Status: DC
  Filled 2017-07-29: qty 14

## 2017-07-29 MED ORDER — DIGOXIN 125 MCG PO TABS
125.0000 ug | ORAL_TABLET | Freq: Every morning | ORAL | Status: DC
  Administered 2017-07-31: 125 ug via ORAL
  Filled 2017-07-29: qty 1

## 2017-07-29 MED ORDER — ONDANSETRON HCL 4 MG/2ML IJ SOLN
4.0000 mg | Freq: Four times a day (QID) | INTRAMUSCULAR | Status: DC | PRN
  Administered 2017-08-01 – 2017-08-02 (×2): 4 mg via INTRAVENOUS
  Filled 2017-07-29 (×2): qty 2

## 2017-07-29 MED ORDER — PREGABALIN 50 MG PO CAPS: 150.0000 mg | ORAL_CAPSULE | Freq: Two times a day (BID) | ORAL | Status: DC

## 2017-07-29 MED ORDER — WARFARIN - PHARMACIST DOSING INPATIENT: Freq: Every day | Status: DC

## 2017-07-29 MED ORDER — FEBUXOSTAT 40 MG PO TABS
80.0000 mg | ORAL_TABLET | Freq: Every morning | ORAL | Status: DC
  Administered 2017-07-29: 80 mg via ORAL
  Filled 2017-07-29 (×2): qty 2

## 2017-07-29 NOTE — ED Provider Notes (Signed)
[facility] EMERGENCY DEPARTMENT Provider Note   CSN: [id number] Arrival date & time: 07/29/17  1044     History   Chief Complaint Chief Complaint  Patient presents with  . Congestive Heart Failure  . Loss of Consciousness  . Hypotension    HPI Maurice Delgado is a 84 y.o. male.  84 year old male with extensive past medical history including CHF, COPD on 2L O2, DVTs, A fib on coumadin, AICD who p/w vomiting, diarrhea, and fall.  The patient was brought in by EMS for a syncopal episode this morning.  He states that around 630 this morning he began having vomiting and diarrhea.  He sat on the toilet and states that he fell asleep but suspect he may have passed out and fell forward and hit his right forehead.  He does note some recent weight gain but states that his chronic shortness of breath has been unchanged recently.  He states that his leg swelling is at baseline.  No fevers.  He denies any chest pain.  No sick contacts or recent travel.   The history is provided by the patient.    Past Medical History:  Diagnosis Date  . Atrial tachycardia ([place])    a. s/p ablation 05/2013.  Marland Kitchen Chronic kidney disease (CKD), stage III (moderate) (HCC)   . Chronic respiratory failure ([place])    a. On home O2 - 2-3L 24/7  . Chronic systolic CHF (congestive heart failure) ([place])   . COPD (chronic obstructive pulmonary disease) ([place])   . DVT (deep venous thrombosis) ([place])    a. Recurrent DVTs. b. s/p IVC filter 2007. c. DVTs dx 05/2013 - started back on Coumadin  . GERD (gastroesophageal reflux disease)   . Gout   . Hemoptysis    a. H/o of hemoptysis with pulmonary fibrosis (patient has refused biopsies in the past).  Marland Kitchen HTN (hypertension)   . 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  . Left fibular fracture    a. 03/2013 - nonsurgical mgmt.  . Nonischemic cardiomyopathy ([place])    a. EF 35%. b. s/p CRT-D device implanted - initially 2002 in [place], gen change 2011 (MDT).  .  OSA on CPAP   . PAF (paroxysmal atrial fibrillation) ([place])   . Retroperitoneal bleed    a. Remotely on Coumadin (per Dr. Jackalyn Lombard prior office note).    Patient Active Problem List   Diagnosis Date Noted  . Lower extremity ulceration ([place]) 09/19/2016  . Chronic venous stasis dermatitis 08/22/2016  . Chronic respiratory failure ([place]) 02/26/2016  . Diastolic dysfunction-grade 2 07/09/2015  . PAF (paroxysmal atrial fibrillation) ([place]) 03/01/2014  . Encounter for therapeutic drug monitoring 07/27/2013  . Atrial tachycardia ([place]) 06/26/2013  . History of DVT (deep vein thrombosis) 06/26/2013  . Anticoagulated on Coumadin 06/26/2013  . NICM (nonischemic cardiomyopathy) ([place]) 06/26/2013  . Dyspnea 05/25/2013  . CKD (chronic kidney disease) stage 3, GFR 30-59 ml/min (HCC) 05/25/2013  . S/P IVC filter 05/25/2013  . Hemoptysis, unspecified 07/23/2011  . Chronic systolic dysfunction of left ventricle 12/09/2010  . Obstructive sleep apnea 12/13/2009  . COPD (chronic obstructive pulmonary disease) ([place]) 09/22/2009    Past Surgical History:  Procedure Laterality Date  . APPENDECTOMY  1940's   "I think so"  . BIV ICD GENERTAOR CHANGE OUT N/A 08/30/2014   Procedure: BIV ICD GENERTAOR CHANGE OUT;  Surgeon: Thompson Grayer, MD;  Location: [facility] CATH LAB;  Service: Cardiovascular;  Laterality: N/A;  . CARDIAC DEFIBRILLATOR PLACEMENT  initial BiV ICD 09/22/00 in [place].  Gen change (MDT) by Dr Leonia Reeves 10/30/09  . CARDIOVERSION N/A 01/09/2016   Procedure: CARDIOVERSION;  Surgeon: Larey Dresser, MD;  Location: [facility] ENDOSCOPY;  Service: Cardiovascular;  Laterality: N/A;  . FOOT SURGERY Right    "removed a knot off top of my foot" (06/21/2013)  . SUPRAVENTRICULAR TACHYCARDIA ABLATION N/A 06/25/2013   Procedure: SUPRAVENTRICULAR TACHYCARDIA ABLATION;  Surgeon: Evans Lance, MD;  Location: [facility] CATH LAB;  Service: Cardiovascular;  Laterality: N/A;  . TEE WITHOUT CARDIOVERSION N/A 01/09/2016   Procedure:  TRANSESOPHAGEAL ECHOCARDIOGRAM (TEE);  Surgeon: Larey Dresser, MD;  Location: [facility];  Service: Cardiovascular;  Laterality: N/A;  . TONSILLECTOMY  1940's  . VENA CAVA FILTER PLACEMENT  2007   [name] 05/25/2013 (06/21/2013)  . VIDEO BRONCHOSCOPY  02/26/2012   Procedure: VIDEO BRONCHOSCOPY WITHOUT FLUORO;  Surgeon: Kathee Delton, MD;  Location: [facility] ENDOSCOPY;  Service: Cardiopulmonary;  Laterality: Bilateral;       Home Medications    Prior to Admission medications   Medication Sig Start Date End Date Taking? Authorizing Provider  Ascorbic Acid (VITAMIN C) 1000 MG tablet Take 1,000 mg by mouth every morning.     [provider]  calcitRIOL (ROCALTROL) 0.5 MCG capsule Take 0.5 mcg by mouth every other day.     [provider]  carvedilol (COREG) 6.25 MG tablet Take 1 tablet (6.25 mg total) by mouth 2 (two) times daily. 12/02/16 03/04/18  Larey Dresser, MD  digoxin (DIGITEK) 0.125 MG tablet Take 1 tablet (125 mcg total) by mouth every morning. 08/29/16   Larey Dresser, MD  Febuxostat (ULORIC) 80 MG TABS Take 80 mg by mouth every morning.     [provider]  NON FORMULARY [facility]  Onychomycosis Nail Lacquer -  Fluconazole 2%, Terbinafine 1% DMSO Apply to affected nail once daily Qty. 120 gm 3 refills    [provider]  NON [place]  Urea Cream - 40% Urea Apply 1-2 grams to affected area 3-4 times daily Qty. 120 gm 3 refills    [provider]  OXYGEN Inhale into the lungs. Is on Oxygen all day long; uses 2 Liters a day    [provider]  pregabalin (LYRICA) 150 MG capsule Take 150 mg by mouth 2 (two) times daily.      [provider]  sotalol (BETAPACE) 160 MG tablet TAKE 1 TABLET BY MOUTH  DAILY 07/02/17   Larey Dresser, MD  spironolactone (ALDACTONE) 25 MG tablet TAKE ONE-HALF TABLET BY  MOUTH DAILY 07/14/17   Bensimhon, Shaune Pascal, MD  Tamsulosin HCl (FLOMAX) 0.4 MG CAPS Take 0.4 mg  by mouth every morning.     [provider]  torsemide (DEMADEX) 20 MG tablet Take 4 tablets (80 mg) in the morning and 3 tablets (60 mg) in the afternoon 12/02/16   Larey Dresser, MD  umeclidinium-vilanterol [facility] ELLIPTA) 62.5-25 MCG/INH AEPB TAKE 1 PUFF BY MOUTH EVERY DAY 02/10/17   Chesley Mires, MD  warfarin (COUMADIN) 5 MG tablet Take as directed by coumadin clinic 04/21/17   Belva Crome, MD    Family History Family History  Problem Relation Age of Onset  . Heart disease Mother        mother also had Rheumatism.  Marland Kitchen Hypertension Mother   . Heart attack Neg Hx   . Stroke Neg Hx     Social History Social History   Tobacco Use  .  Smoking status: Former Smoker    Packs/day: 1.50    Years: 30.00    Pack years: 45.00    Types: Cigarettes  . Smokeless tobacco: Never Used  . Tobacco comment: 06/21/2013 "quit smoking ~ 1986"  Substance Use Topics  . Alcohol use: No    Comment: 06/21/2013 "quit drinking ~ 1970"  . Drug use: Yes    Types: Marijuana    Comment: 06/21/2013 "stopped using marijuana ~ 1970"     Allergies   Amiodarone   Review of Systems Review of Systems All other systems reviewed and are negative except that which was mentioned in HPI   Physical Exam Updated Vital Signs BP 133/77   Pulse (!) 101   Resp 19   SpO2 98%   Physical Exam  Constitutional: He is oriented to person, place, and time. He appears well-developed and well-nourished. No distress.  Chronically ill appearing, NAD  HENT:  Head: Normocephalic.  Nose: Nose normal.  dry mucous membranes, dried emesis on beard  Eyes: Conjunctivae are normal. Pupils are equal, round, and reactive to light.  Pinpoint pupils  Neck: Neck supple.  Cardiovascular: Regular rhythm and normal heart sounds. Tachycardia present.  No murmur heard. Pulmonary/Chest: No respiratory distress.  Increased WOB with tachypnea, diminished BS b/l  Abdominal: Soft. Bowel sounds are normal. He exhibits no  distension. There is no tenderness.  Musculoskeletal: He exhibits edema (3+ pitting BLE).  Neurological: He is alert and oriented to person, place, and time.  Fluent speech  Skin: Skin is warm and dry.  Abrasion R forehead  Psychiatric: He has a normal mood and affect. Judgment normal.  Nursing note and vitals reviewed.    ED Treatments / Results  Labs (all labs ordered are listed, but only abnormal results are displayed) Labs Reviewed  BASIC METABOLIC PANEL - Abnormal; Notable for the following components:      Result Value   Chloride 99 (*)    Glucose, Bld 108 (*)    BUN 42 (*)    Creatinine, Ser 2.99 (*)    GFR calc non Af Amer 18 (*)    GFR calc Af Amer 21 (*)    All other components within normal limits  CBC - Abnormal; Notable for the following components:   WBC 12.7 (*)    Hemoglobin 11.9 (*)    Platelets 92 (*)    All other components within normal limits  BRAIN NATRIURETIC PEPTIDE - Abnormal; Notable for the following components:   B Natriuretic Peptide 242.5 (*)    All other components within normal limits  HEPATIC FUNCTION PANEL - Abnormal; Notable for the following components:   Albumin 3.1 (*)    ALT 14 (*)    All other components within normal limits  PROTIME-INR - Abnormal; Notable for the following components:   Prothrombin Time 33.7 (*)    All other components within normal limits  DIGOXIN LEVEL - Abnormal; Notable for the following components:   Digoxin Level 0.6 (*)    All other components within normal limits  TROPONIN I - Abnormal; Notable for the following components:   Troponin I 0.04 (*)    All other components within normal limits  I-STAT CG4 LACTIC ACID, ED - Abnormal; Notable for the following components:   Lactic Acid, Venous 2.09 (*)    All other components within normal limits  GASTROINTESTINAL PANEL BY PCR, STOOL (REPLACES STOOL CULTURE)  URINE CULTURE  LIPASE, BLOOD  TSH  URINALYSIS, ROUTINE W REFLEX MICROSCOPIC  TROPONIN  I    TROPONIN I  URINALYSIS, ROUTINE W REFLEX MICROSCOPIC  I-STAT TROPONIN, ED  I-STAT CG4 LACTIC ACID, ED    EKG  EKG Interpretation None       Radiology No results found.  Procedures Procedures (including critical care time)  Medications Ordered in ED Medications - No data to display   Initial Impression / Assessment and Plan / ED Course  I have reviewed the triage vital signs and the nursing notes.  Pertinent labs & imaging results that were available during my care of the patient were reviewed by me and considered in my medical decision making (see chart for details).    PT on coumadin, h/o CHF and on O2, p/w syncopal episode at home with head injury after vomiting and diarrhea illness.  He was nontoxic on exam but he was dyspneic.  He had evidence of volume overload.  Obtained above labs as well as chest x-ray and head CT.  Head CT negative acute.  Chest x-ray shows mild CHF changes.  Initial troponin 0.06, creatinine elevated today at 2.99.  LFTs and lipase are reassuring.  INR mildly supratherapeutic at 3.35.  He has no abdominal tenderness and I do not feel he needs any imaging currently.  He remains dyspneic on repeat exam and family notes he has gained weight at home. I am concerned about his pulmonary edema and worsening kidney function in setting of CHF exacerbation.  Gave the patient 100 mg IV Lasix and discussed admission with internal medicine teaching service.  Patient admitted for further diuresis.  Final Clinical Impressions(s) / ED Diagnoses   Final diagnoses:  Chronic respiratory failure with hypoxia (HCC)  CKD (chronic kidney disease), stage IV (HCC)  Multiple thyroid nodules  Hematuria, undiagnosed cause    ED Discharge Orders    None       Little, Wenda Overland, MD 07/29/17 [phone]

## 2017-07-29 NOTE — H&P (Signed)
History and Physical    Maurice Delgado IWL:[id number] DOB: 07-Dec-1933 DOA: 07/29/2017  **Will we will place patient in observation status based on the expectation that the patient will need hospitalization/ hospital care for duration of </= 24 hours  PCP: Elwyn Reach, MD   Attending physician: Lorin Mercy  Patient coming from/Resides with: Private residence/wife  Chief Complaint: Syncopal episode with fall from seated position  HPI: Maurice Delgado is a 84 y.o. male with medical history significant for systolic and diastolic heart failure with an EF of 20-25%, permanent pacemaker with ICD, hypertension, stage IV chronic kidney disease, PAF on warfarin, history of DVT, chronic lower extremity edema, sleep apnea on CPAP, COPD on 2-3 L of oxygen followed by Dr. Halford Chessman in the outpatient setting, and multiple subcentimeter thyroid nodules evaluated by ultrasound in 2011.  Patient reportedly got up to go to the bathroom to void and then at some point either fell asleep or passed out and fell forward and hit the right side of his forehead against an object in the bathroom.  It is unclear as to whether a solitary episode of nausea vomiting with loose stools preceded this episode or occurred afterwards.  EMS was called to the home and patient's blood pressure was noted to be in the 17E systolic.  He was given 400 cc of saline and upon arrival to the ER blood pressure was up to 109.  Patient utilizes 2-3 L of oxygen at home and O2 sats were decreasing to 86% on 2 L.  Sats were up to 96% on 4 L.  Chest x-ray in the ER was read as mild CHF.  BNP was only mildly elevated at 243 and consistent with previous readings.  Lactate was slightly elevated at 2.09.  White count 12,700 differential not obtained.  Platelets 92,000.  INR slightly supratherapeutic at 3.35, digoxin was subtherapeutic at 0.6.  Of note, wife reports that 2 weeks ago patient was diagnosed with a UTI (she does not think a culture was  obtained) and patient was treated with empiric antibiotics and has completed taking these medications.  Patient reportedly has been complaining of significant dysuria and back pain for 24 hours.  Patient has chronic lower extremity edema and there was some concern patient may have had increased weight gain and may have missed some dosages of his heart failure medications.  Because of these findings (loading mild hypoxemia despite usual dose of oxygen) and positive chest x-ray for mild CHF EDP has opted to treat patient as mild systolic CHF exacerbation and has given a one-time dose of 100 mg Lasix IV.  ED Course:  Vital Signs: BP 124/80   Pulse 91   Resp (!) 21   SpO2 90%  Chest x-ray: Mild CHF CT Head: No acute intracranial injury Lab data: Sodium 140, potassium 3.9, chloride 99, CO2 29, glucose 108, BUN 42, creatinine 2.99, calcium 9.0, anion gap 12, albumin 3.1, LFTs not elevated, BNP 243, poc troponin 0.06, lactic acid 2.09, white count 12,700 differential not obtained, hemoglobin 11.9, platelets 92,000, PT 33.7, INR 3.35 Medications and treatments: Lasix 100 mg IV x1  Review of Systems:  In addition to the HPI above,  No Fever-chills, myalgias or other constitutional symptoms No Headache, changes with Vision or hearing, new weakness, tingling, numbness in any extremity, dysarthria or word finding difficulty, gait disturbance or imbalance, tremors or seizure activity No problems swallowing food or Liquids, indigestion/reflux, choking or coughing while eating, abdominal pain with or after eating No Chest  pain, Cough or Shortness of Breath, palpitations, orthopnea or DOE No Abdominal pain, melena,hematochezia, dark tarry stools, constipation No malodorous urine No new skin rashes, lesions, masses or bruises, No new joint pains, aches, swelling or redness No recent unintentional weight gain or loss No polyuria, polydypsia or polyphagia   Past Medical History:  Diagnosis Date  . Atrial  tachycardia ([place])    a. s/p ablation 05/2013.  Marland Kitchen Chronic respiratory failure with hypoxia ([place])    a. On home O2 - 2-3L 24/7  . Chronic systolic CHF (congestive heart failure) ([place])   . CKD (chronic kidney disease), stage IV ([place])   . COPD (chronic obstructive pulmonary disease) ([place])   . DVT (deep venous thrombosis) ([place])    a. Recurrent DVTs. b. s/p IVC filter 2007. c. DVTs dx 05/2013 - started back on Coumadin  . GERD (gastroesophageal reflux disease)   . Gout   . Hemoptysis    a. H/o of hemoptysis with pulmonary fibrosis (patient has refused biopsies in the past).  Marland Kitchen HTN (hypertension)   . 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  . Left fibular fracture    a. 03/2013 - nonsurgical mgmt.  . Multiple thyroid nodules   . Nonischemic cardiomyopathy (HCC)    a. EF 35%. b. s/p CRT-D device implanted - initially 2002 in [place], gen change 2011 (MDT).  . OSA on CPAP   . PAF (paroxysmal atrial fibrillation) ([place])   . Retroperitoneal bleed    a. Remotely on Coumadin (per Dr. Jackalyn Lombard prior office note).    Past Surgical History:  Procedure Laterality Date  . APPENDECTOMY  1940's   "I think so"  . BIV ICD GENERTAOR CHANGE OUT N/A 08/30/2014   Procedure: BIV ICD GENERTAOR CHANGE OUT;  Surgeon: Thompson Grayer, MD;  Location: [facility] CATH LAB;  Service: Cardiovascular;  Laterality: N/A;  . CARDIAC DEFIBRILLATOR PLACEMENT     initial BiV ICD 09/22/00 in [place].  Gen change (MDT) by Dr Leonia Reeves 10/30/09  . CARDIOVERSION N/A 01/09/2016   Procedure: CARDIOVERSION;  Surgeon: Larey Dresser, MD;  Location: [facility] ENDOSCOPY;  Service: Cardiovascular;  Laterality: N/A;  . FOOT SURGERY Right    "removed a knot off top of my foot" (06/21/2013)  . SUPRAVENTRICULAR TACHYCARDIA ABLATION N/A 06/25/2013   Procedure: SUPRAVENTRICULAR TACHYCARDIA ABLATION;  Surgeon: Evans Lance, MD;  Location: [facility] CATH LAB;  Service: Cardiovascular;  Laterality: N/A;  . TEE WITHOUT CARDIOVERSION N/A 01/09/2016   Procedure: TRANSESOPHAGEAL  ECHOCARDIOGRAM (TEE);  Surgeon: Larey Dresser, MD;  Location: [facility];  Service: Cardiovascular;  Laterality: N/A;  . TONSILLECTOMY  1940's  . VENA CAVA FILTER PLACEMENT  2007   [name] 05/25/2013 (06/21/2013)  . VIDEO BRONCHOSCOPY  02/26/2012   Procedure: VIDEO BRONCHOSCOPY WITHOUT FLUORO;  Surgeon: Kathee Delton, MD;  Location: [facility] ENDOSCOPY;  Service: Cardiopulmonary;  Laterality: Bilateral;    Social History   Socioeconomic History  . Marital status: Married    Spouse name: Not on file  . Number of children: Not on file  . Years of education: Not on file  . Highest education level: Not on file  Social Needs  . Financial resource strain: Not on file  . Food insecurity - worry: Not on file  . Food insecurity - inability: Not on file  . Transportation needs - medical: Not on file  . Transportation needs - non-medical: Not on file  Occupational History  . Occupation: reitred    Comment: from education  Tobacco Use  .  Smoking status: Former Smoker    Packs/day: 1.50    Years: 30.00    Pack years: 45.00    Types: Cigarettes  . Smokeless tobacco: Never Used  . Tobacco comment: 06/21/2013 "quit smoking ~ 1986"  Substance and Sexual Activity  . Alcohol use: No    Comment: 06/21/2013 "quit drinking ~ 1970"  . Drug use: Yes    Types: Marijuana    Comment: 06/21/2013 "stopped using marijuana ~ 1970"  . Sexual activity: Not Currently  Other Topics Concern  . Not on file  Social History Narrative   Married and has 1 son.    Lives in [place].  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    Mobility: Utilizes a rolling walker; family as well as primary cardiologist confirm patient extremely inactive-typically sits all day with feet dependent Work history: Not obtained   Allergies  Allergen Reactions  . Amiodarone     hyperthyroidism    Family History  Problem Relation Age of Onset  . Heart disease Mother        mother also had Rheumatism.  Marland Kitchen Hypertension Mother   . Heart  attack Neg Hx   . Stroke Neg Hx     Prior to Admission medications   Medication Sig Start Date End Date Taking? Authorizing Provider  Ascorbic Acid (VITAMIN C) 1000 MG tablet Take 1,000 mg by mouth every morning.    Yes [provider]  calcitRIOL (ROCALTROL) 0.5 MCG capsule Take 0.5 mcg by mouth every other day.    Yes [provider]  carvedilol (COREG) 6.25 MG tablet Take 1 tablet (6.25 mg total) by mouth 2 (two) times daily. 12/02/16 03/04/18 Yes Larey Dresser, MD  digoxin (DIGITEK) 0.125 MG tablet Take 1 tablet (125 mcg total) by mouth every morning. 08/29/16  Yes Larey Dresser, MD  Febuxostat (ULORIC) 80 MG TABS Take 80 mg by mouth every morning.    Yes [provider]  NON [place]  Urea Cream - 40% Urea Apply 1-2 grams to affected area 3-4 times daily Qty. 120 gm 3 refills   Yes [provider]  OXYGEN Inhale into the lungs. Is on Oxygen all day long; uses 2 Liters a day   Yes [provider]  pregabalin (LYRICA) 150 MG capsule Take 150 mg by mouth 2 (two) times daily.     Yes [provider]  sotalol (BETAPACE) 160 MG tablet TAKE 1 TABLET BY MOUTH  DAILY 07/02/17  Yes Larey Dresser, MD  spironolactone (ALDACTONE) 25 MG tablet TAKE ONE-HALF TABLET BY  MOUTH DAILY 07/14/17  Yes Bensimhon, Shaune Pascal, MD  Tamsulosin HCl (FLOMAX) 0.4 MG CAPS Take 0.4 mg by mouth every morning.    Yes [provider]  torsemide (DEMADEX) 20 MG tablet Take 4 tablets (80 mg) in the morning and 3 tablets (60 mg) in the afternoon 12/02/16  Yes Larey Dresser, MD  umeclidinium-vilanterol (ANORO ELLIPTA) 62.5-25 MCG/INH AEPB TAKE 1 PUFF BY MOUTH EVERY DAY 02/10/17  Yes Chesley Mires, MD  warfarin (COUMADIN) 5 MG tablet Take as directed by coumadin clinic Patient taking differently: Take 5-7.5 mg by mouth See admin instructions. Take as directed by coumadin clinic.  Tuesday, Thursday,Saturday, Sunday 5mg  . Monday, Wednesday, Friday  7.5mg  04/21/17  Yes Belva Crome, MD    Physical Exam: Vitals:   07/29/17 1215 07/29/17 1315 07/29/17 1345 07/29/17 1415  BP: 102/70 117/72 119/78 124/80  Pulse: 92 90 91 91  Resp: 17 17  20 (!) 21  SpO2: 96% 94% 95% 90%      Constitutional: NAD, calm, comfortable Eyes: PERRL, lids and conjunctivae normal ENMT: Mucous membranes are dry. Posterior pharynx clear of any exudate or lesions.Age appropriate dentition.  Neck: normal, supple, no masses, no thyromegaly Respiratory: clear to auscultation bilaterally, no wheezing, no crackles. Normal respiratory effort. No accessory muscle use.  Cardiovascular: Irregular rate with primarily ventricular paced rhythm, no murmurs / rubs / gallops.  Marked bilateral lower extremity edema-plus. 2+ pedal pulses. No carotid bruits.  Abdomen: no tenderness, no masses palpated. No hepatosplenomegaly. Bowel sounds positive.  Genitourinary: Complained of mild suprapubic pain upon palpation, evidence of recent bleeding noted on inner thighs without any evidence of penile or scrotal trauma concerning for possible hematuria Musculoskeletal: no clubbing / cyanosis. No joint deformity upper and lower extremities. Good ROM, no contractures. Normal muscle tone.  Skin: no rashes, lesions, ulcers. No induration-small reddened bruise right anterior forehead assistant with report of syncopal episode and accidental trauma to forehead Neurologic: CN 2-12 grossly intact. Sensation intact, DTR normal. Strength 5/5 x all 4 extremities.  Psychiatric: Normal judgment and insight-family reports patient is at baseline mentation. Alert and oriented x 3. Normal mood.    Labs on Admission: I have personally reviewed following labs and imaging studies  CBC: Recent Labs  Lab 07/29/17 1057  WBC 12.7*  HGB 11.9*  HCT 39.1  MCV 90.3  PLT 92*   Basic Metabolic Panel: Recent Labs  Lab 07/29/17 1057  NA 140  K 3.9  CL 99*  CO2 29  GLUCOSE 108*  BUN 42*  CREATININE  2.99*  CALCIUM 9.0   GFR: CrCl cannot be calculated (Unknown ideal weight.). Liver Function Tests: Recent Labs  Lab 07/29/17 1057  AST 25  ALT 14*  ALKPHOS 76  BILITOT 1.0  PROT 6.5  ALBUMIN 3.1*   Recent Labs  Lab 07/29/17 1057  LIPASE 45   No results for input(s): AMMONIA in the last 168 hours. Coagulation Profile: Recent Labs  Lab 07/29/17 1057  INR 3.35   Cardiac Enzymes: No results for input(s): CKTOTAL, CKMB, CKMBINDEX, TROPONINI in the last 168 hours. BNP (last 3 results) No results for input(s): PROBNP in the last 8760 hours. HbA1C: No results for input(s): HGBA1C in the last 72 hours. CBG: No results for input(s): GLUCAP in the last 168 hours. Lipid Profile: No results for input(s): CHOL, HDL, LDLCALC, TRIG, CHOLHDL, LDLDIRECT in the last 72 hours. Thyroid Function Tests: No results for input(s): TSH, T4TOTAL, FREET4, T3FREE, THYROIDAB in the last 72 hours. Anemia Panel: No results for input(s): VITAMINB12, FOLATE, FERRITIN, TIBC, IRON, RETICCTPCT in the last 72 hours. Urine analysis:    Component Value Date/Time   COLORURINE YELLOW 07/22/2012 1651   APPEARANCEUR CLEAR 07/22/2012 1651   LABSPEC 1.007 07/22/2012 1651   PHURINE 7.0 07/22/2012 1651   GLUCOSEU NEGATIVE 07/22/2012 1651   HGBUR TRACE (A) 07/22/2012 1651   BILIRUBINUR NEGATIVE 07/22/2012 1651   KETONESUR NEGATIVE 07/22/2012 1651   PROTEINUR NEGATIVE 07/22/2012 1651   UROBILINOGEN 0.2 07/22/2012 1651   NITRITE NEGATIVE 07/22/2012 1651   LEUKOCYTESUR NEGATIVE 07/22/2012 1651   Sepsis Labs: @LABRCNTIP (procalcitonin:4,lacticidven:4) )No results found for this or any previous visit (from the past 240 hour(s)).   Radiological Exams on Admission: Ct Head Wo Contrast  Result Date: 07/29/2017 CLINICAL DATA:  Recent syncopal episode with head trauma EXAM: CT HEAD WITHOUT CONTRAST TECHNIQUE: Contiguous axial images were obtained from the base of the skull through the vertex  without intravenous  contrast. COMPARISON:  None. FINDINGS: Brain: Scattered chronic white matter ischemic change is identified. No findings to suggest acute hemorrhage, acute infarction or space-occupying mass lesion are noted. Vascular: No hyperdense vessel or unexpected calcification. Skull: Normal. Negative for fracture or focal lesion. Sinuses/Orbits: No acute finding. Other: Very mild soft tissue swelling is noted anteriorly in the right forehead. IMPRESSION: Chronic ischemic change without acute abnormality. Electronically Signed   By: Inez Catalina M.D.   On: 07/29/2017 11:48   Dg Chest Portable 1 View  Result Date: 07/29/2017 CLINICAL DATA:  CHF, syncope EXAM: PORTABLE CHEST 1 VIEW COMPARISON:  01/09/2016 FINDINGS: Mild bilateral interstitial thickening and prominence of the central pulmonary vasculature. No pleural effusion or pneumothorax. Stable cardiomegaly. Three lead cardiac pacemaker is noted. No acute osseous abnormality. IMPRESSION: Findings concerning for mild CHF. Electronically Signed   By: Kathreen Devoid   On: 07/29/2017 11:31    EKG: (Independently reviewed) ventricular paced rhythm with rate of 95 bpm, QTC 495 ms  Assessment/Plan Principal Problem:   Syncope -Patient presents to ER via EMS after apparently having a syncopal episode while seated on the toilet; it is unclear as to whether patient had nausea, vomiting and diarrhea before or after syncopal event-patient denies recollection of straining while attempting to pass bowel movement and states primarily went to bathroom to void -Echocardiogram -Cycle troponin -Neurochecks every 4 hours -Mobilize with assistance -Unclear of vasovagal vs volume depletion/orthostasis-check orthostatic vital signs -He was hypotensive upon arrival of EMS to home with blood pressure improving after administration of 400 cc normal saline-patient may actually benefit from additional volume-does have mildly elevated serum lactate/follow  ** d/w attending and she  agrees pt appears more volume depleted so will give low rate IVFs at 50/hr  Active Problems:   Acute on chronic systolic and diastolic heart failure, NYHA class 1  -Known nonischemic cardiomyopathy followed by Dr. McClean/cardiology heart failure clinic -Mild CHF chest x-ray and stable BNP-EDP opted to give Lasix 100 mg IV x1-for now we will hold Aldactone and home Demadex -Patient has chronic bilateral lower extremity edema and sits with legs dependent therefore this may not be reflective of volume status -No weight obtained in the ER -Continue preadmission carvedilol -Not on ACE I/ARB due to CKD -Obtaining echo in context of syncopal episode prior to arrival    Nausea, vomiting and diarrhea -Apparent solitary occurrence -For completeness of exam will check gastrointestinal PCR panel to rule out possible viral gastro-enteritis -?  Possibly related to inadequately treated UTI (see below)    Acute on chronic respiratory failure with hypoxemia/ COPD with hypoxia  -Patient on 2 L oxygen at home with O2 sats 86% -According to outpatient pulmonology note patient is to be on 3 L at home; he also has chronic dyspnea which pulmonology suspects is related to his COPD -Uncertain if recent mild hypoxemia reflective of CHF exacerbation and suspect this is more related to his underlying severe COPD -Continue preadmission Anoro    HTN (hypertension) -See above regarding antihypertensive medications    CKD (chronic kidney disease) stage 4, GFR 15-29 ml/min  -Current renal function stable and at baseline with only slight increase in serum creatinine -Follow labs after above dose of Lasix given -Continue calcitriol for metabolic bone disease    Paroxysmal atrial fibrillation  -Currently rate controlled and 100% ventricular paced -Pharmacy to manage warfarin-current INR slightly supratherapeutic at 3.35 -Continue Betapace and carvedilol -Continue digoxin noting current level 0.6 -CHA2DS2-VASc=5     UTI (  recent) w/ persistent back pain and ? Hematuria/dysuria -Wife reports patient recently completed unknown antibiotic for UTI-wife does not think culture was obtained -Patient now with suprapubic discomfort, dysuria and back pain as well as what appears to be hematuria with blood noted on medial thighs during genitourinary assessment -No PMH of renal calculi -Obtain urinalysis and culture-prefer voided specimen since we are concerned about possible hematuria i.e. would like to avoid traumatic specimen -Patient does have mild leukocytosis -Bladder scan revealed about 136 cc of urine in bladder time of my evaluation -Renal US ? Renal calculi- can't undergo CT renal stone/requires contrast and pt w/ CKD IV    Chronic edema of legs -As noted above -Patient sits with legs dependent despite having recliner available -SCDs order to assist with mobilization of fluid -Likely not reflective of true volume overload status -For completeness of evaluation will obtain TSH to rule out hypothyroidism    OSA on CPAP    History of DVT in adulthood    Pacemaker      DVT prophylaxis: Warfarin Code Status: Full Family Communication: Wife and daughter Disposition Plan: Home Consults called: None    Kisa Fujii L. ANP-BC [facility] Pager [phone]   If 7PM-7AM, please contact night-coverage [email or web address] Password [facility]  07/29/2017, 2:27 PM

## 2017-07-29 NOTE — Progress Notes (Signed)
Noted BP low 82/55. Lethargic and drowsy. Page sent to Dr Lorin Mercy.

## 2017-07-29 NOTE — ED Notes (Signed)
Crit Lab: Troponin 0.04 MD made aware

## 2017-07-29 NOTE — ED Notes (Signed)
Ebony Hail, admitting NP, at bedside.

## 2017-07-29 NOTE — ED Notes (Signed)
Dr. Rex Kras notified of I stat lactic results by B. Yolanda Bonine, EMT

## 2017-07-29 NOTE — ED Triage Notes (Signed)
Pt brought by EMS for syncopal episode, fell and hit head, bruise to right forehead. Pt was 71T systolic upon arrival. CHF pt, has had recent weight gain. Pt was given 400 ML of NS. BP 953 systolic upon arrival. 96D PIV to [place]. Pt has a pacemaker. Wears 2 L O2 via nasal cannula. O2 sats dropped to 86% with moving him to stretcher. Now 96% on 4L. Pt also endorses nausea vomiting and diarrhea since yesterday. Denies fevers

## 2017-07-29 NOTE — ED Notes (Signed)
attempted to call report

## 2017-07-29 NOTE — Progress Notes (Signed)
ANTICOAGULATION CONSULT NOTE - Initial Consult  Pharmacy Consult for warfarin Indication: atrial fibrillation  Allergies  Allergen Reactions  . Amiodarone     hyperthyroidism    Vital Signs: BP: 102/70 (01/29 1215) Pulse Rate: 92 (01/29 1215)  Labs: Recent Labs    07/29/17 1057  HGB 11.9*  HCT 39.1  PLT 92*  LABPROT 33.7*  INR 3.35  CREATININE 2.99*    CrCl cannot be calculated (Unknown ideal weight.).   Medical History: Past Medical History:  Diagnosis Date  . Atrial tachycardia ([place])    a. s/p ablation 05/2013.  Marland Kitchen Chronic respiratory failure with hypoxia ([place])    a. On home O2 - 2-3L 24/7  . Chronic systolic CHF (congestive heart failure) ([place])   . CKD (chronic kidney disease), stage IV ([place])   . COPD (chronic obstructive pulmonary disease) ([place])   . DVT (deep venous thrombosis) ([place])    a. Recurrent DVTs. b. s/p IVC filter 2007. c. DVTs dx 05/2013 - started back on Coumadin  . GERD (gastroesophageal reflux disease)   . Gout   . Hemoptysis    a. H/o of hemoptysis with pulmonary fibrosis (patient has refused biopsies in the past).  Marland Kitchen HTN (hypertension)   . 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  . Left fibular fracture    a. 03/2013 - nonsurgical mgmt.  . Multiple thyroid nodules   . Nonischemic cardiomyopathy (HCC)    a. EF 35%. b. s/p CRT-D device implanted - initially 2002 in [place], gen change 2011 (MDT).  . OSA on CPAP   . PAF (paroxysmal atrial fibrillation) ([place])   . Retroperitoneal bleed    a. Remotely on Coumadin (per Dr. Jackalyn Lombard prior office note).    Medications: Warfarin 7.5 mg M,W,F and 5 mg all other days prior to admission  Assessment: 84 y.o. Male presented to the ED after a syncopal episode.  PMH includes afib and history of DVT.  CT of head showed no signs of acute hemorrhage.  INR upon admission was slightly supratherapeutic at 3.35 and last dose of warfarin was 1/28 at 1900.  Hemoglobin 11.9 but stable. No reports of  bleeding.  Goal of Therapy:  INR 2-3 Monitor platelets by anticoagulation protocol: Yes   Plan:  -warfarin 5mg  x 1 -monitor daily INR, CBC, and signs/symptoms of bleeding  Thank you for allowing [place] to participate in this patients care.  Mariella Saa, PharmD 07/29/2017 2:11 PM

## 2017-07-29 NOTE — ED Notes (Signed)
Returned from CT.

## 2017-07-29 NOTE — Progress Notes (Signed)
Dr Lorin Mercy returned page. Fluid bolus in progress

## 2017-07-29 NOTE — ED Notes (Signed)
Attempted to call report

## 2017-07-30 ENCOUNTER — Other Ambulatory Visit: Payer: Self-pay

## 2017-07-30 ENCOUNTER — Observation Stay (HOSPITAL_COMMUNITY): Payer: Medicare HMO

## 2017-07-30 DIAGNOSIS — N390 Urinary tract infection, site not specified: Secondary | ICD-10-CM | POA: Diagnosis present

## 2017-07-30 DIAGNOSIS — R609 Edema, unspecified: Secondary | ICD-10-CM | POA: Diagnosis not present

## 2017-07-30 DIAGNOSIS — N179 Acute kidney failure, unspecified: Secondary | ICD-10-CM

## 2017-07-30 DIAGNOSIS — N184 Chronic kidney disease, stage 4 (severe): Secondary | ICD-10-CM | POA: Diagnosis present

## 2017-07-30 DIAGNOSIS — Z9989 Dependence on other enabling machines and devices: Secondary | ICD-10-CM

## 2017-07-30 DIAGNOSIS — I340 Nonrheumatic mitral (valve) insufficiency: Secondary | ICD-10-CM | POA: Diagnosis not present

## 2017-07-30 DIAGNOSIS — J9621 Acute and chronic respiratory failure with hypoxia: Secondary | ICD-10-CM | POA: Diagnosis present

## 2017-07-30 DIAGNOSIS — R319 Hematuria, unspecified: Secondary | ICD-10-CM | POA: Diagnosis present

## 2017-07-30 DIAGNOSIS — J9602 Acute respiratory failure with hypercapnia: Secondary | ICD-10-CM | POA: Diagnosis not present

## 2017-07-30 DIAGNOSIS — I429 Cardiomyopathy, unspecified: Secondary | ICD-10-CM | POA: Diagnosis present

## 2017-07-30 DIAGNOSIS — Z9119 Patient's noncompliance with other medical treatment and regimen: Secondary | ICD-10-CM | POA: Diagnosis not present

## 2017-07-30 DIAGNOSIS — G4733 Obstructive sleep apnea (adult) (pediatric): Secondary | ICD-10-CM | POA: Diagnosis present

## 2017-07-30 DIAGNOSIS — Z86718 Personal history of other venous thrombosis and embolism: Secondary | ICD-10-CM | POA: Diagnosis not present

## 2017-07-30 DIAGNOSIS — R197 Diarrhea, unspecified: Secondary | ICD-10-CM | POA: Diagnosis not present

## 2017-07-30 DIAGNOSIS — D696 Thrombocytopenia, unspecified: Secondary | ICD-10-CM | POA: Diagnosis present

## 2017-07-30 DIAGNOSIS — Z95810 Presence of automatic (implantable) cardiac defibrillator: Secondary | ICD-10-CM | POA: Diagnosis not present

## 2017-07-30 DIAGNOSIS — G9341 Metabolic encephalopathy: Secondary | ICD-10-CM | POA: Diagnosis present

## 2017-07-30 DIAGNOSIS — Z9981 Dependence on supplemental oxygen: Secondary | ICD-10-CM | POA: Diagnosis not present

## 2017-07-30 DIAGNOSIS — G9340 Encephalopathy, unspecified: Secondary | ICD-10-CM | POA: Diagnosis not present

## 2017-07-30 DIAGNOSIS — I5043 Acute on chronic combined systolic (congestive) and diastolic (congestive) heart failure: Secondary | ICD-10-CM | POA: Diagnosis present

## 2017-07-30 DIAGNOSIS — R55 Syncope and collapse: Secondary | ICD-10-CM | POA: Diagnosis not present

## 2017-07-30 DIAGNOSIS — Z6836 Body mass index (BMI) 36.0-36.9, adult: Secondary | ICD-10-CM | POA: Diagnosis not present

## 2017-07-30 DIAGNOSIS — E669 Obesity, unspecified: Secondary | ICD-10-CM | POA: Diagnosis present

## 2017-07-30 DIAGNOSIS — R5381 Other malaise: Secondary | ICD-10-CM | POA: Diagnosis not present

## 2017-07-30 DIAGNOSIS — E875 Hyperkalemia: Secondary | ICD-10-CM | POA: Diagnosis present

## 2017-07-30 DIAGNOSIS — R112 Nausea with vomiting, unspecified: Secondary | ICD-10-CM | POA: Diagnosis not present

## 2017-07-30 DIAGNOSIS — E860 Dehydration: Secondary | ICD-10-CM | POA: Diagnosis present

## 2017-07-30 DIAGNOSIS — I248 Other forms of acute ischemic heart disease: Secondary | ICD-10-CM | POA: Diagnosis present

## 2017-07-30 DIAGNOSIS — Z87891 Personal history of nicotine dependence: Secondary | ICD-10-CM | POA: Diagnosis not present

## 2017-07-30 DIAGNOSIS — E872 Acidosis: Secondary | ICD-10-CM | POA: Diagnosis present

## 2017-07-30 DIAGNOSIS — I480 Paroxysmal atrial fibrillation: Secondary | ICD-10-CM | POA: Diagnosis present

## 2017-07-30 DIAGNOSIS — J9622 Acute and chronic respiratory failure with hypercapnia: Secondary | ICD-10-CM | POA: Diagnosis present

## 2017-07-30 DIAGNOSIS — J9690 Respiratory failure, unspecified, unspecified whether with hypoxia or hypercapnia: Secondary | ICD-10-CM | POA: Diagnosis present

## 2017-07-30 DIAGNOSIS — I130 Hypertensive heart and chronic kidney disease with heart failure and stage 1 through stage 4 chronic kidney disease, or unspecified chronic kidney disease: Secondary | ICD-10-CM | POA: Diagnosis present

## 2017-07-30 DIAGNOSIS — Z950 Presence of cardiac pacemaker: Secondary | ICD-10-CM | POA: Diagnosis not present

## 2017-07-30 DIAGNOSIS — I878 Other specified disorders of veins: Secondary | ICD-10-CM | POA: Diagnosis present

## 2017-07-30 LAB — BLOOD GAS, ARTERIAL
ACID-BASE EXCESS: 3.9 mmol/L — AB (ref 0.0–2.0)
Acid-Base Excess: 4.2 mmol/L — ABNORMAL HIGH (ref 0.0–2.0)
Acid-Base Excess: 3.4 mmol/L — ABNORMAL HIGH (ref 0.0–2.0)
BICARBONATE: 31.5 mmol/L — AB (ref 20.0–28.0)
BICARBONATE: 29.9 mmol/L — AB (ref 20.0–28.0)
Bicarbonate: 30.1 mmol/L — ABNORMAL HIGH (ref 20.0–28.0)
DELIVERY SYSTEMS: POSITIVE
Delivery systems: POSITIVE
Drawn by: 519031
Drawn by: 519031
Drawn by: 519031
EXPIRATORY PAP: 7
Expiratory PAP: 7
FIO2: 35
FIO2: 35
INSPIRATORY PAP: 14
INSPIRATORY PAP: 18
LHR: 14 {breaths}/min
O2 CONTENT: 2 L/min
O2 SAT: 95.3 %
O2 Saturation: 88 %
O2 Saturation: 96.3 %
PATIENT TEMPERATURE: 99.6
PATIENT TEMPERATURE: 99.6
PH ART: 7.288 — AB (ref 7.350–7.450)
PO2 ART: 84.7 mmHg (ref 83.0–108.0)
Patient temperature: 100.5
RATE: 14 resp/min
pCO2 arterial: 82.8 mmHg (ref 32.0–48.0)
pCO2 arterial: 73.4 mmHg (ref 32.0–48.0)
pCO2 arterial: 65.5 mmHg (ref 32.0–48.0)
pH, Arterial: 7.209 — ABNORMAL LOW (ref 7.350–7.450)
pH, Arterial: 7.24 — ABNORMAL LOW (ref 7.350–7.450)
pO2, Arterial: 61.7 mmHg — ABNORMAL LOW (ref 83.0–108.0)
pO2, Arterial: 92.4 mmHg (ref 83.0–108.0)

## 2017-07-30 LAB — GLUCOSE, CAPILLARY
GLUCOSE-CAPILLARY: 100 mg/dL — AB (ref 65–99)
GLUCOSE-CAPILLARY: 102 mg/dL — AB (ref 65–99)
GLUCOSE-CAPILLARY: 105 mg/dL — AB (ref 65–99)
Glucose-Capillary: 118 mg/dL — ABNORMAL HIGH (ref 65–99)
Glucose-Capillary: 111 mg/dL — ABNORMAL HIGH (ref 65–99)

## 2017-07-30 LAB — PROTIME-INR
INR: 3.17
PROTHROMBIN TIME: 32.3 s — AB (ref 11.4–15.2)

## 2017-07-30 LAB — COMPREHENSIVE METABOLIC PANEL
ALT: 20 U/L (ref 17–63)
ANION GAP: 14 (ref 5–15)
AST: 45 U/L — ABNORMAL HIGH (ref 15–41)
Albumin: 3 g/dL — ABNORMAL LOW (ref 3.5–5.0)
Alkaline Phosphatase: 61 U/L (ref 38–126)
BILIRUBIN TOTAL: 0.8 mg/dL (ref 0.3–1.2)
BUN: 46 mg/dL — ABNORMAL HIGH (ref 6–20)
CO2: 29 mmol/L (ref 22–32)
Calcium: 8.7 mg/dL — ABNORMAL LOW (ref 8.9–10.3)
Chloride: 98 mmol/L — ABNORMAL LOW (ref 101–111)
Creatinine, Ser: 3.73 mg/dL — ABNORMAL HIGH (ref 0.61–1.24)
GFR calc Af Amer: 16 mL/min — ABNORMAL LOW (ref >60)
GFR, EST NON AFRICAN AMERICAN: 14 mL/min — AB (ref >60)
Glucose, Bld: 98 mg/dL (ref 65–99)
POTASSIUM: 5.4 mmol/L — AB (ref 3.5–5.1)
Sodium: 141 mmol/L (ref 135–145)
TOTAL PROTEIN: 6.8 g/dL (ref 6.5–8.1)

## 2017-07-30 LAB — BASIC METABOLIC PANEL
ANION GAP: 13 (ref 5–15)
BUN: 56 mg/dL — ABNORMAL HIGH (ref 6–20)
CHLORIDE: 102 mmol/L (ref 101–111)
CO2: 26 mmol/L (ref 22–32)
Calcium: 7.9 mg/dL — ABNORMAL LOW (ref 8.9–10.3)
Creatinine, Ser: 4.08 mg/dL — ABNORMAL HIGH (ref 0.61–1.24)
GFR calc Af Amer: 14 mL/min — ABNORMAL LOW (ref >60)
GFR, EST NON AFRICAN AMERICAN: 12 mL/min — AB (ref >60)
Glucose, Bld: 113 mg/dL — ABNORMAL HIGH (ref 65–99)
POTASSIUM: 5.3 mmol/L — AB (ref 3.5–5.1)
Sodium: 141 mmol/L (ref 135–145)

## 2017-07-30 LAB — POCT I-STAT 3, ART BLOOD GAS (G3+)
Acid-Base Excess: 4 mmol/L — ABNORMAL HIGH (ref 0.0–2.0)
Bicarbonate: 30.5 mmol/L — ABNORMAL HIGH (ref 20.0–28.0)
O2 SAT: 92 %
PCO2 ART: 52.9 mmHg — AB (ref 32.0–48.0)
PH ART: 7.37 (ref 7.350–7.450)
PO2 ART: 68 mmHg — AB (ref 83.0–108.0)
Patient temperature: 98.9
TCO2: 32 mmol/L (ref 22–32)

## 2017-07-30 LAB — CBC
HEMATOCRIT: 40.6 % (ref 39.0–52.0)
HEMATOCRIT: 38.7 % — AB (ref 39.0–52.0)
HEMOGLOBIN: 11.8 g/dL — AB (ref 13.0–17.0)
HEMOGLOBIN: 11 g/dL — AB (ref 13.0–17.0)
MCH: 27 pg (ref 26.0–34.0)
MCH: 26.3 pg (ref 26.0–34.0)
MCHC: 29.1 g/dL — ABNORMAL LOW (ref 30.0–36.0)
MCHC: 28.4 g/dL — AB (ref 30.0–36.0)
MCV: 92.9 fL (ref 78.0–100.0)
MCV: 92.6 fL (ref 78.0–100.0)
Platelets: 103 10*3/uL — ABNORMAL LOW (ref 150–400)
Platelets: 72 10*3/uL — ABNORMAL LOW (ref 150–400)
RBC: 4.37 MIL/uL (ref 4.22–5.81)
RBC: 4.18 MIL/uL — ABNORMAL LOW (ref 4.22–5.81)
RDW: 16.3 % — ABNORMAL HIGH (ref 11.5–15.5)
RDW: 16.4 % — ABNORMAL HIGH (ref 11.5–15.5)
WBC: 26 10*3/uL — AB (ref 4.0–10.5)
WBC: 19 10*3/uL — ABNORMAL HIGH (ref 4.0–10.5)

## 2017-07-30 LAB — TRIGLYCERIDES: TRIGLYCERIDES: 206 mg/dL — AB (ref <150)

## 2017-07-30 LAB — TROPONIN I: TROPONIN I: 0.12 ng/mL — AB (ref <0.03)

## 2017-07-30 LAB — AMMONIA: AMMONIA: 33 umol/L (ref 9–35)

## 2017-07-30 LAB — LACTIC ACID, PLASMA: Lactic Acid, Venous: 1.9 mmol/L (ref 0.5–1.9)

## 2017-07-30 MED ORDER — FENTANYL CITRATE (PF) 100 MCG/2ML IJ SOLN
50.0000 ug | INTRAMUSCULAR | Status: AC | PRN
  Administered 2017-07-30 – 2017-07-31 (×3): 50 ug via INTRAVENOUS
  Filled 2017-07-30 (×3): qty 2

## 2017-07-30 MED ORDER — SODIUM CHLORIDE 0.9 % IV BOLUS (SEPSIS)
500.0000 mL | Freq: Once | INTRAVENOUS | Status: AC
  Administered 2017-07-30: 500 mL via INTRAVENOUS

## 2017-07-30 MED ORDER — ETOMIDATE 2 MG/ML IV SOLN
0.3000 mg/kg | Freq: Once | INTRAVENOUS | Status: AC
  Administered 2017-07-30: 20 mg via INTRAVENOUS

## 2017-07-30 MED ORDER — NALOXONE HCL 0.4 MG/ML IJ SOLN
0.4000 mg | Freq: Once | INTRAMUSCULAR | Status: AC
  Administered 2017-07-30: 0.4 mg via INTRAVENOUS
  Filled 2017-07-30: qty 1

## 2017-07-30 MED ORDER — VANCOMYCIN HCL 10 G IV SOLR
1750.0000 mg | INTRAVENOUS | Status: DC
  Administered 2017-07-30 – 2017-08-01 (×2): 1750 mg via INTRAVENOUS
  Filled 2017-07-30 (×2): qty 1750

## 2017-07-30 MED ORDER — SODIUM CHLORIDE 0.9 % IV SOLN
500.0000 mL | Freq: Once | INTRAVENOUS | Status: AC
  Administered 2017-07-30: 500 mL via INTRAVENOUS

## 2017-07-30 MED ORDER — PIPERACILLIN-TAZOBACTAM 3.375 G IVPB
3.3750 g | Freq: Three times a day (TID) | INTRAVENOUS | Status: DC
  Administered 2017-07-30 – 2017-08-01 (×7): 3.375 g via INTRAVENOUS
  Filled 2017-07-30 (×9): qty 50

## 2017-07-30 MED ORDER — FENTANYL CITRATE (PF) 100 MCG/2ML IJ SOLN
INTRAMUSCULAR | Status: AC
  Administered 2017-07-30: 100 ug
  Filled 2017-07-30: qty 2

## 2017-07-30 MED ORDER — ROCURONIUM BROMIDE 50 MG/5ML IV SOLN
50.0000 mg | Freq: Once | INTRAVENOUS | Status: AC
  Administered 2017-07-30: 50 mg via INTRAVENOUS

## 2017-07-30 MED ORDER — IPRATROPIUM-ALBUTEROL 0.5-2.5 (3) MG/3ML IN SOLN
3.0000 mL | Freq: Four times a day (QID) | RESPIRATORY_TRACT | Status: DC
  Administered 2017-07-30 – 2017-07-31 (×5): 3 mL via RESPIRATORY_TRACT
  Filled 2017-07-30 (×4): qty 3

## 2017-07-30 MED ORDER — FENTANYL CITRATE (PF) 100 MCG/2ML IJ SOLN
50.0000 ug | INTRAMUSCULAR | Status: DC | PRN
  Filled 2017-07-30: qty 2

## 2017-07-30 MED ORDER — MIDAZOLAM HCL 2 MG/2ML IJ SOLN
INTRAMUSCULAR | Status: AC
Start: 2017-07-30 — End: 2017-07-30
  Administered 2017-07-30: 2 mg
  Filled 2017-07-30: qty 2

## 2017-07-30 MED ORDER — WARFARIN SODIUM 5 MG PO TABS
5.0000 mg | ORAL_TABLET | Freq: Once | ORAL | Status: DC
  Filled 2017-07-30: qty 1

## 2017-07-30 MED ORDER — PROPOFOL 1000 MG/100ML IV EMUL
0.0000 ug/kg/min | INTRAVENOUS | Status: DC
  Administered 2017-07-30: 10 ug/kg/min via INTRAVENOUS
  Filled 2017-07-30: qty 100

## 2017-07-30 NOTE — Progress Notes (Signed)
Pt intubated with 7.21mm tube & orogastric tube placed at this time

## 2017-07-30 NOTE — Progress Notes (Signed)
Spoke to resident - addressed BP - orders received for a 500 cc NS fluid bolus BP 77/56 family at bedside

## 2017-07-30 NOTE — Progress Notes (Signed)
Pt lethargic and non-responsive moans to painful stimuli, not following commands, Dr Eliseo Squires , rapid response, and RT at bedside, new orders received , bi-pap applied, NS bolus running, will continue to monitor closely

## 2017-07-30 NOTE — Discharge Instructions (Signed)

## 2017-07-30 NOTE — Progress Notes (Signed)
Patients family at bedside. Patient repositioned at this time Foley draining at bedside.  Nonlabored breathing noted. Patient is on 60% FIO2 sating 98%. Will continue to follow physician's orders.  Will continue to monitor.

## 2017-07-30 NOTE — Progress Notes (Signed)
Pt received from [place] per bed as rapid response call on Bipap with possible intubation

## 2017-07-30 NOTE — Progress Notes (Signed)
ANTICOAGULATION + ANTIBIOTIC CONSULT NOTE - Follow Up Consult  Pharmacy Consult for Coumadin; Vancomycin and Zosyn Indication: atrial fibrillation and hx DVT;  Empiric for fever  Allergies  Allergen Reactions  . Amiodarone     hyperthyroidism    Patient Measurements: Height: 6\' 3"  (190.5 cm) Weight: 294 lb 12.1 oz (133.7 kg) IBW/kg (Calculated) : 84.5  Vital Signs: Temp: 100.5 F (38.1 C) (01/30 1130) Temp Source: Rectal (01/30 1130) BP: 107/61 (01/30 1053) Pulse Rate: 83 (01/30 1053)  Labs: Recent Labs    07/29/17 1057 07/29/17 1359 07/29/17 1913 07/30/17 0157  HGB 11.9*  --   --  11.8*  HCT 39.1  --   --  40.6  PLT 92*  --   --  103*  LABPROT 33.7*  --   --  32.3*  INR 3.35  --   --  3.17  CREATININE 2.99*  --   --  3.73*  TROPONINI  --  0.04* 0.04* 0.12*    Estimated Creatinine Clearance: 22.9 mL/min (A) (by C-G formula based on SCr of 3.73 mg/dL (H)).  Assessment:  84 yr old male on Coumadin prior to admission for atrial fibrillation and hx DVT.  INR is just above goal (3.17), but down form 3.35 yesterday.  On Coumadin 7.5 mg MWF and 5 mg TTSS prior to admission.  Coumadin 5 mg given on 1/29 rather than usual Monday dose of 7.5 mg.     Ceftriaxone 1gm IV q24hrs begun 1/29 for UTI. Now expanding coverage to Vancomycin and Zosyn.  Creatinine 2.99>3.73.  Tmax 100.5, WBC up to 26.0.  Recent outpatient course of Augmentin 1/15-1/19.  Blood cultures sent. Urine culture and GI panel pending.  Goal of Therapy:  INR 2-3 Monitor platelets by anticoagulation protocol: Yes  Vancomycin troughs 15-20 mcg/ml Appropriate Zosyn dose for indication and renal function   Plan:   Coumadin 5 mg x 1 today, per usual regimen.  Daily PT/INR.  Vancomycin 1750 mg IV q48hrs.  Zosyn 3.375 gm IV q8hrs (each over 4 hrs).  Follow renal function, culture data, and clinical progress.  Adjust antibiotic doses as needed.  Vanc trough level as needed.   Arty Baumgartner, [place] Pager:  [phone] or [phone] 07/30/2017,2:47 PM

## 2017-07-30 NOTE — Progress Notes (Signed)
BP 71/50 made DR Nelda Marseille aware and orders received

## 2017-07-30 NOTE — Progress Notes (Signed)
Pt only voided 175 cc of amber urine throughout 12 h shift. Bladder scan yield 54 cc.  Ferdinand Lango, RN

## 2017-07-30 NOTE — Significant Event (Signed)
Rapid Response Event Note  Overview:  Called to assist with patient with decreased LOC and low BP Time Called: 1015 Arrival Time: 1020 Event Type: Neurologic, Hypotension  Initial Focused Assessment:  Lying in bed - skin warm to touch -dry Resps shallow - no acute distress RR 24 Bil BS - decreased.   RN reports night handoff states patient sleeping las pm.  Pupils pinpoint - sluggish reaction.  Moves UE to DPS - purposeful on right - RN Gwen at bedside with Dr. Eliseo Squires -they report bil grips spont.   Abd large soft Condom cath - dark urine  Vpaced on monitor BP soft 86/48 - NS infusing at 250 cc/hr Rectal temp 100.5    CBG 105   Interventions:  Repositoned in bed - HOB elevated.  Narcan 0.4 mg IV given per Dr Eliseo Squires order - no narcotics known since admission.  Stat ABG done - reports from records that patient uses CPAP at home.  BP responding to fluids - 107/6 HR 85.  Suctioned oral cavity - gag and cough present.  1053:  Placed on Bipap 14/7 with 35% FiO2. for hypercarbia.  Patient tol well.   No po's this am.  Will watch on Bipap - RN to monitor resp and neuro status.   1200: Follow up - repeat ABG drawn only 35 minutes post placement of Bipap - slightly better.  MD notified. Neuro remains same. Had fall at home yesterday - on Coumadin  - order for CT head. 1250:  CT head negative.  BP remains soft.  Family in - updated - patient responding to grandchild's voice - mumbling but still very sleepy.   1400:  Remains sleepy - MD updated - ABG requested. ABG improving but mental status still concern - gag with stimulation but weak - moves to pain - mumbles - not following commands. BP remains soft Dr. Eliseo Squires updated - ABX changed.   Dr. Eliseo Squires updated - transfer to ICU - Dr. Nelda Marseille at bedside - transferred to 2M12 - handoff to [facility].  Wife updated.      Plan of Care (if not transferred):  Event Summary: Name of Physician Notified: Dr. Eliseo Squires at (pta RRT)  Name of Consulting Physician Notified:  PCCM at 1445  Outcome: Transferred (Comment)     Quin Hoop

## 2017-07-30 NOTE — Progress Notes (Signed)
ANTICOAGULATION CONSULT NOTE - Initial Consult  Pharmacy Consult for Heparin Indication: atrial fibrillation  Allergies  Allergen Reactions  . Amiodarone     hyperthyroidism    Patient Measurements: Height: 6\' 3"  (190.5 cm) Weight: 294 lb 12.1 oz (133.7 kg) IBW/kg (Calculated) : 84.5 Heparin Dosing Weight:  99 kg  Vital Signs: Temp: 100.5 F (38.1 C) (01/30 1130) Temp Source: Rectal (01/30 1130) BP: 107/61 (01/30 1053) Pulse Rate: 80 (01/30 1601)  Labs: Recent Labs    07/29/17 1057 07/29/17 1359 07/29/17 1913 07/30/17 0157 07/30/17 1516  HGB 11.9*  --   --  11.8*  --   HCT 39.1  --   --  40.6  --   PLT 92*  --   --  103*  --   LABPROT 33.7*  --   --  32.3*  --   INR 3.35  --   --  3.17  --   CREATININE 2.99*  --   --  3.73* 4.08*  TROPONINI  --  0.04* 0.04* 0.12*  --     Estimated Creatinine Clearance: 20.9 mL/min (A) (by C-G formula based on SCr of 4.08 mg/dL (H)).   Medical History: Past Medical History:  Diagnosis Date  . Atrial tachycardia ([place])    a. s/p ablation 05/2013.  Marland Kitchen Chronic respiratory failure with hypoxia ([place])    a. On home O2 - 2-3L 24/7  . Chronic systolic CHF (congestive heart failure) ([place])   . CKD (chronic kidney disease), stage IV ([place])   . COPD (chronic obstructive pulmonary disease) ([place])   . DVT (deep venous thrombosis) ([place])    a. Recurrent DVTs. b. s/p IVC filter 2007. c. DVTs dx 05/2013 - started back on Coumadin  . GERD (gastroesophageal reflux disease)   . Gout   . Hemoptysis    a. H/o of hemoptysis with pulmonary fibrosis (patient has refused biopsies in the past).  Marland Kitchen HTN (hypertension)   . 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  . Left fibular fracture    a. 03/2013 - nonsurgical mgmt.  . Multiple thyroid nodules   . Nonischemic cardiomyopathy (HCC)    a. EF 35%. b. s/p CRT-D device implanted - initially 2002 in [place], gen change 2011 (MDT).  . OSA on CPAP   . PAF (paroxysmal atrial fibrillation) ([place])   .  Retroperitoneal bleed    a. Remotely on Coumadin (per Dr. Jackalyn Lombard prior office note).    Assessment:  Anticoag: Coumadin for afib, hx DVT--d/c Coumadin and start IV heparin after transfer to 18M for acute respiratory failure. INR currently 3.17  Goal of Therapy:  Heparin level 0.3-0.7 units/ml Monitor platelets by anticoagulation protocol: Yes   Plan:  Hold IV heparin until [facility]. Alford Highland, PharmD, BCPS Clinical Staff Pharmacist Pager [phone]  Eilene Ghazi Stillinger 07/30/2017,4:20 PM

## 2017-07-30 NOTE — Progress Notes (Signed)
Came back to evaluate patient, ABG has improved on Bipap but patient's mental status has not-- stat head CT looking for brain bleed (fall at home on coumadin) was negative.  Abx broadened, lactic acid ordered along with basic labs and chest x ray.  Concern as patient would not be able to remove Bipap if vomited and mental status not improved.  Spoke with PCCM physician and plan to transfer to ICU for evaluation.  Family confirmed patient full code and would want mechanical ventilation (wife/daughter at bedside) -recent treatment by nephrologist for UTI with 5 days of augmentin  Eulogio Bear DO

## 2017-07-30 NOTE — Progress Notes (Addendum)
PROGRESS NOTE    Maurice Delgado  EYC:[id number] DOB: February 16, 1934 DOA: 07/29/2017 PCP: Elwyn Reach, MD   Outpatient Specialists:     Brief Narrative:   Maurice Delgado is a 84 y.o. male with a Past Medical History of afib on Coumadin; OSA on CPAP; HTN; GERD; COPD on home O2; stage IV CKD; and chronic systolic HF (EF 14% in 9/70) who presents with weakness and a fall.  In discussion with the patient and family, he developed diarrhea Sunday after dietary indiscretion (does not eat pork but ate a sausage biscuit that a friend brought over).  He had multiple episodes of diarrhea.  He also had significant dysuria and hesitancy on Saturday that has been ongoing and he was found to have some blood in his underwear today.  His family reports a very sedentary lifestyle - he sleeps all day and stays up much of the night - and no change in his chronic LE edema and venous stasis.  Today, he was in the bathroom with more loose stools when he was unable to stand up because he was so weak.  He fell and struck his head, but it does not sound as though he lost consciousness.  In the ER, there was concern for a CHF exacerbation and so he was given Lasix.  He had <200 cc on a bladder scan prior and did not have UOP following the Lasix.  On exam, he had mildly dry MM.  RRR.  Lungs CTA.  Abd obese but NT.  LE with marked chronic edema and evidence of venous stasis without apparent infection.   Assessment & Plan:   Principal Problem:   Syncope Active Problems:   OSA on CPAP   HTN (hypertension)   History of DVT in adulthood   Acute on chronic systolic and diastolic heart failure, NYHA class 1 (HCC)   Nausea vomiting and diarrhea   Pacemaker   Acute on chronic respiratory failure with hypoxemia (HCC)   CKD (chronic kidney disease) stage 4, GFR 15-29 ml/min (HCC)   COPD with hypoxia (HCC)   Paroxysmal atrial fibrillation (HCC)   UTI (recent) w/ persistent back pain and ? hematuria/dysuria  Chronic edema of legs   Acute respiratory failure due to CO2 retention -has gag reflex and protecting airway -did not wear CPAP last PM-- ABG shows elevated CO2 -place on BIPAP-- recheck ABG in 1 -2 hours  AMS -suspect due to CO2 retention -if not improved, plan to re- CT scan head looking for bleed -no response to narcan  Syncope -Patient presents to ER via EMS after apparently having a syncopal episode while seated on the toilet; it is unclear as to whether patient had nausea, vomiting and diarrhea before or after syncopal event-patient denies recollection of straining while attempting to pass bowel movement and states primarily went to bathroom to void -Echocardiogram -Cycle troponin- mildly elevated -Unclear of vasovagal vs volume depletion/orthostasis-check orthostatic vital signs -He was hypotensive upon arrival of EMS to home with blood pressure improving after administration of 400 cc normal saline-patient may actually benefit from additional volume-does have mildly elevated serum lactate/follow  Fevers with leukocytosis -suspect due to urinary source per admission but NO U/A nor urine culture done -no  blood cultures either -have ordered -currently on rocephin-- no old cultures to obtain information from -recently on 5 days of augmentin from Dr. Jimmy Footman started 1/15  AKI on CKD IV -suspect from dehydration -fluid bolus and then monitor -renal U/S shows: Irregular diffuse wall thickening of  the bladder consistent with infectious or inflammatory cystitis. No discrete bladder mass  Chronic edema of legs -Patient sits with legs dependent despite having recliner available -SCDs order to assist with mobilization of fluid -Likely not reflective of true volume overload status -TSH normal  OSA -CPAP at night  Paroxysmal atrial fibrillation  -Currently rate controlled and 100% ventricular paced -Pharmacy to manage warfarin-current INR slightly supratherapeutic at  3.35 -Continue Betapace and carvedilol -Continue digoxin noting current level 0.6 -CHA2DS2-VASc=5   Nausea, vomiting and diarrhea -Apparent solitary occurrence -For completeness of exam will check gastrointestinal PCR panel to rule out possible viral gastro-enteritis -?  Possibly related to inadequately treated UTI (see below)       DVT prophylaxis:  Fully anticoagulated   Code Status: Full Code   Family Communication: Called to update wife-- all questions answered  Disposition Plan:    Consultants:        Subjective: Called by nurse to see patient as he is unresponsive  Objective: Vitals:   07/29/17 2335 07/30/17 0235 07/30/17 0638 07/30/17 0749  BP: 95/64 106/63 110/64 105/60  Pulse: 85 84 87 86  Resp:   20   Temp:   100.1 F (37.8 C) 99.6 F (37.6 C)  TempSrc:   Oral Axillary  SpO2: 95% 100% 99% 100%  Weight:   133.7 kg (294 lb 12.1 oz)   Height:        Intake/Output Summary (Last 24 hours) at 07/30/2017 1044 Last data filed at 07/30/2017 0926 Gross per 24 hour  Intake 75 ml  Output 175 ml  Net -100 ml   Filed Weights   07/29/17 1733 07/30/17 1610  Weight: 133.7 kg (294 lb 12.1 oz) 133.7 kg (294 lb 12.1 oz)    Examination:  General exam: in bed, moans to sternal rub Respiratory system: no wheezing, diminished Cardiovascular system: rrr Gastrointestinal system: +BS, obese Central nervous system: did squeeze with both hands x1, spontaneously moved left leg,  Skin: chronic changes b/l, charcot joint on right foot Psychiatry: minimally responsive    Data Reviewed: I have personally reviewed following labs and imaging studies  CBC: Recent Labs  Lab 07/29/17 1057 07/30/17 0157  WBC 12.7* 26.0*  HGB 11.9* 11.8*  HCT 39.1 40.6  MCV 90.3 92.9  PLT 92* 960*   Basic Metabolic Panel: Recent Labs  Lab 07/29/17 1057 07/30/17 0157  NA 140 141  K 3.9 5.4*  CL 99* 98*  CO2 29 29  GLUCOSE 108* 98  BUN 42* 46*  CREATININE 2.99* 3.73*   CALCIUM 9.0 8.7*   GFR: Estimated Creatinine Clearance: 22.9 mL/min (A) (by C-G formula based on SCr of 3.73 mg/dL (H)). Liver Function Tests: Recent Labs  Lab 07/29/17 1057 07/30/17 0157  AST 25 45*  ALT 14* 20  ALKPHOS 76 61  BILITOT 1.0 0.8  PROT 6.5 6.8  ALBUMIN 3.1* 3.0*   Recent Labs  Lab 07/29/17 1057  LIPASE 45   No results for input(s): AMMONIA in the last 168 hours. Coagulation Profile: Recent Labs  Lab 07/29/17 1057 07/30/17 0157  INR 3.35 3.17   Cardiac Enzymes: Recent Labs  Lab 07/29/17 1359 07/29/17 1913 07/30/17 0157  TROPONINI 0.04* 0.04* 0.12*   BNP (last 3 results) No results for input(s): PROBNP in the last 8760 hours. HbA1C: No results for input(s): HGBA1C in the last 72 hours. CBG: Recent Labs  Lab 07/29/17 1917 07/30/17 0744 07/30/17 1039  GLUCAP 110* 118* 105*   Lipid Profile: No results for input(s):  CHOL, HDL, LDLCALC, TRIG, CHOLHDL, LDLDIRECT in the last 72 hours. Thyroid Function Tests: Recent Labs    07/29/17 1359  TSH 1.493   Anemia Panel: No results for input(s): VITAMINB12, FOLATE, FERRITIN, TIBC, IRON, RETICCTPCT in the last 72 hours. Urine analysis:    Component Value Date/Time   COLORURINE YELLOW 07/22/2012 1651   APPEARANCEUR CLEAR 07/22/2012 1651   LABSPEC 1.007 07/22/2012 1651   PHURINE 7.0 07/22/2012 1651   GLUCOSEU NEGATIVE 07/22/2012 1651   HGBUR TRACE (A) 07/22/2012 1651   BILIRUBINUR NEGATIVE 07/22/2012 1651   KETONESUR NEGATIVE 07/22/2012 1651   PROTEINUR NEGATIVE 07/22/2012 1651   UROBILINOGEN 0.2 07/22/2012 1651   NITRITE NEGATIVE 07/22/2012 1651   LEUKOCYTESUR NEGATIVE 07/22/2012 1651    Recent Results (from the past 240 hour(s))  MRSA PCR Screening     Status: None   Collection Time: 07/29/17  5:30 PM  Result Value Ref Range Status   MRSA by PCR NEGATIVE NEGATIVE Final    Comment:        The GeneXpert MRSA Assay (FDA approved for NASAL specimens only), is one component of  a comprehensive MRSA colonization surveillance program. It is not intended to diagnose MRSA infection nor to guide or monitor treatment for MRSA infections.       Anti-infectives (From admission, onward)   Start     Dose/Rate Route Frequency Ordered Stop   07/29/17 1800  cefTRIAXone (ROCEPHIN) 1 g in dextrose 5 % 50 mL IVPB     1 g 100 mL/hr over 30 Minutes Intravenous Every 24 hours 07/29/17 1747         Radiology Studies: Ct Head Wo Contrast  Result Date: 07/29/2017 CLINICAL DATA:  Recent syncopal episode with head trauma EXAM: CT HEAD WITHOUT CONTRAST TECHNIQUE: Contiguous axial images were obtained from the base of the skull through the vertex without intravenous contrast. COMPARISON:  None. FINDINGS: Brain: Scattered chronic white matter ischemic change is identified. No findings to suggest acute hemorrhage, acute infarction or space-occupying mass lesion are noted. Vascular: No hyperdense vessel or unexpected calcification. Skull: Normal. Negative for fracture or focal lesion. Sinuses/Orbits: No acute finding. Other: Very mild soft tissue swelling is noted anteriorly in the right forehead. IMPRESSION: Chronic ischemic change without acute abnormality. Electronically Signed   By: Inez Catalina M.D.   On: 07/29/2017 11:48   US Renal  Result Date: 07/29/2017 CLINICAL DATA:  Hematuria.  Stage 4 chronic kidney disease. EXAM: RENAL / URINARY TRACT ULTRASOUND COMPLETE COMPARISON:  None. FINDINGS: Right Kidney: Length: 10.7 cm. Increased renal parenchymal echogenicity. Renal cortical thinning. Small cyst arises from the anterior midpole measuring 11 mm. No other renal masses, no stones and no hydronephrosis. Left Kidney: Length: 11.3 cm. Increased parenchymal echogenicity. Mild renal cortical thinning. 14 mm midpole cyst. There is a less well-defined cystic lesion arising from the upper pole measuring 4 x 2.5 x 3 cm with apparent wall calcification. No other renal masses. No hydronephrosis.  Bladder: Bladder is mildly distended. There is diffuse irregular wall thickening without a discrete mass. No bladder stone. IMPRESSION: 1. Irregular diffuse wall thickening of the bladder consistent with infectious or inflammatory cystitis. No discrete bladder mass. 2. Findings of medical renal disease with increased renal parenchymal echogenicity. No hydronephrosis. 3. Renal cysts. One from the upper pole of the left kidney is mildly complicated. Electronically Signed   By: Lajean Manes M.D.   On: 07/29/2017 17:23   Dg Chest Portable 1 View  Result Date: 07/29/2017 CLINICAL DATA:  CHF,  syncope EXAM: PORTABLE CHEST 1 VIEW COMPARISON:  01/09/2016 FINDINGS: Mild bilateral interstitial thickening and prominence of the central pulmonary vasculature. No pleural effusion or pneumothorax. Stable cardiomegaly. Three lead cardiac pacemaker is noted. No acute osseous abnormality. IMPRESSION: Findings concerning for mild CHF. Electronically Signed   By: Kathreen Devoid   On: 07/29/2017 11:31        Scheduled Meds: . calcitRIOL  0.5 mcg Oral QODAY  . carvedilol  6.25 mg Oral BID  . digoxin  125 mcg Oral q morning - 10a  . febuxostat  80 mg Oral q morning - 10a  . sodium chloride flush  3 mL Intravenous Q12H  . sotalol  160 mg Oral Daily  . tamsulosin  0.4 mg Oral q morning - 10a  . triamcinolone 0.1 % cream : eucerin   Topical BID  . umeclidinium-vilanterol  1 puff Inhalation Daily  . Warfarin - Pharmacist Dosing Inpatient   Does not apply q1800   Continuous Infusions: . sodium chloride 50 mL/hr at 07/29/17 1834  . cefTRIAXone (ROCEPHIN)  IV Stopped (07/29/17 1907)  . sodium chloride       LOS: 0 days    Time spent: 35 min    Geradine Girt, DO [facility] Pager [phone]  If 7PM-7AM, please contact night-coverage [email or web address] Password [facility] 07/30/2017, 10:44 AM

## 2017-07-30 NOTE — Consult Note (Signed)
PULMONARY / CRITICAL CARE MEDICINE   Name: Dory Verdun MRN: [id number] DOB: [birth year]    ADMISSION DATE:  07/29/2017 CONSULTATION DATE:  1/30  REFERRING MD:  Eliseo Squires ([facility])   CHIEF COMPLAINT:  Respiratory failure   HISTORY OF PRESENT ILLNESS:   84 year old male with history of A. fib on Coumadin, OSA on CPAP, COPD on home O2, stage IV CKD, chronic systolic heart failure (EF 30%), recent?  UTI treated with 5 days of Augmentin starting on 1/15.  He presented 1/29 with multiple vague complaints including diarrhea, weakness, fall at home related to? syncopal episode.  There was initially concern for CHF exacerbation he was diuresed  without significant change.  He now has worsening renal function, fevers and altered mental status related to acute hypercarbic respiratory failure.  He was initially trialed on BiPAP with some improvement in PCO2 on ABG but patient continued to be significantly altered, not protecting his airway.  CT head was negative for bleed (in setting fall at home on Coumadin) and CCM was consulted for transfer and probable intubation.      PAST MEDICAL HISTORY :  He  has a past medical history of Atrial tachycardia ([place]), Chronic respiratory failure with hypoxia ([place]), Chronic systolic CHF (congestive heart failure) ([place]), CKD (chronic kidney disease), stage IV ([facility]), COPD (chronic obstructive pulmonary disease) ([place]), DVT (deep venous thrombosis) ([place]), GERD (gastroesophageal reflux disease), Gout, Hemoptysis, HTN (hypertension), Hyperthyroidism, Left fibular fracture, Multiple thyroid nodules, Nonischemic cardiomyopathy ([place]), OSA on CPAP, PAF (paroxysmal atrial fibrillation) ([place]), and Retroperitoneal bleed.  PAST SURGICAL HISTORY: He  has a past surgical history that includes Cardiac defibrillator placement; Video bronchoscopy (02/26/2012); Foot surgery (Right); Tonsillectomy (1940's); Appendectomy (1940's); Vena cava filter placement (2007); supraventricular tachycardia  ablation (N/A, 06/25/2013); Biv icd genertaor change out (N/A, 08/30/2014); TEE without cardioversion (N/A, 01/09/2016); and Cardioversion (N/A, 01/09/2016).  Allergies  Allergen Reactions  . Amiodarone     hyperthyroidism    No current facility-administered medications on file prior to encounter.    Current Outpatient Medications on File Prior to Encounter  Medication Sig  . Ascorbic Acid (VITAMIN C) 1000 MG tablet Take 1,000 mg by mouth every morning.   . calcitRIOL (ROCALTROL) 0.5 MCG capsule Take 0.5 mcg by mouth every other day.   . carvedilol (COREG) 6.25 MG tablet Take 1 tablet (6.25 mg total) by mouth 2 (two) times daily.  . digoxin (DIGITEK) 0.125 MG tablet Take 1 tablet (125 mcg total) by mouth every morning.  . Febuxostat (ULORIC) 80 MG TABS Take 80 mg by mouth every morning.   . NON FORMULARY [facility]  Urea Cream - 40% Urea Apply 1-2 grams to affected area 3-4 times daily Qty. 120 gm 3 refills  . OXYGEN Inhale into the lungs. Is on Oxygen all day long; uses 2 Liters a day  . pregabalin (LYRICA) 150 MG capsule Take 150 mg by mouth 2 (two) times daily.    . sotalol (BETAPACE) 160 MG tablet TAKE 1 TABLET BY MOUTH  DAILY  . spironolactone (ALDACTONE) 25 MG tablet TAKE ONE-HALF TABLET BY  MOUTH DAILY  . Tamsulosin HCl (FLOMAX) 0.4 MG CAPS Take 0.4 mg by mouth every morning.   . torsemide (DEMADEX) 20 MG tablet Take 4 tablets (80 mg) in the morning and 3 tablets (60 mg) in the afternoon  . umeclidinium-vilanterol (ANORO ELLIPTA) 62.5-25 MCG/INH AEPB TAKE 1 PUFF BY MOUTH EVERY DAY  . warfarin (COUMADIN) 5 MG tablet Take as directed by coumadin clinic (Patient taking differently: Take  5-7.5 mg by mouth See admin instructions. Take as directed by coumadin clinic.  Tuesday, Thursday,Saturday, Sunday 5mg  . Monday, Wednesday, Friday 7.5mg )    FAMILY HISTORY:  His indicated that his mother is deceased. He indicated that his father is deceased. He indicated that the status of his  neg hx is unknown.   SOCIAL HISTORY: He  reports that he has quit smoking. His smoking use included cigarettes. He has a 45.00 pack-year smoking history. he has never used smokeless tobacco. He reports that he uses drugs. Drug: Marijuana. He reports that he does not drink alcohol.  REVIEW OF SYSTEMS:   Unable, as per HPI above. Pt lethargic on bipap.    SUBJECTIVE:    VITAL SIGNS: BP 107/61   Pulse 83   Temp (!) 100.5 F (38.1 C) (Rectal)   Resp (!) 24   Ht 6\' 3"  (1.905 m)   Wt 133.7 kg (294 lb 12.1 oz)   SpO2 99%   BMI 36.84 kg/m   HEMODYNAMICS:    VENTILATOR SETTINGS: FiO2 (%):  [35 %] 35 %  INTAKE / OUTPUT: I/O last 3 completed shifts: In: 26 [I.V.:15; IV Piggyback:60] Out: 175 [Urine:175]  PHYSICAL EXAMINATION: General:  Elderly, chronically ill appearing male, NAD  Neuro:  Lethargic, withdraws to pain, mumbles, does not follow commands, +gag  HEENT:  Mm moist, bipap  Cardiovascular:  s1s2 rrr Lungs:  resps even, not significantly labored on bipap, scattered rhonchi  Abdomen:  Round, soft, +bs  Musculoskeletal:  Warm and dry, chronic BLE edema   LABS:  BMET Recent Labs  Lab 07/29/17 1057 07/30/17 0157  NA 140 141  K 3.9 5.4*  CL 99* 98*  CO2 29 29  BUN 42* 46*  CREATININE 2.99* 3.73*  GLUCOSE 108* 98    Electrolytes Recent Labs  Lab 07/29/17 1057 07/30/17 0157  CALCIUM 9.0 8.7*    CBC Recent Labs  Lab 07/29/17 1057 07/30/17 0157  WBC 12.7* 26.0*  HGB 11.9* 11.8*  HCT 39.1 40.6  PLT 92* 103*    Coag's Recent Labs  Lab 07/29/17 1057 07/30/17 0157  INR 3.35 3.17    Sepsis Markers Recent Labs  Lab 07/29/17 1417 07/29/17 1905  LATICACIDVEN 2.09* 2.3*    ABG Recent Labs  Lab 07/30/17 1025 07/30/17 1135 07/30/17 1448  PHART 7.209* 7.240* 7.288*  PCO2ART 82.8* 73.4* 65.5*  PO2ART 61.7* 84.7 92.4    Liver Enzymes Recent Labs  Lab 07/29/17 1057 07/30/17 0157  AST 25 45*  ALT 14* 20  ALKPHOS 76 61  BILITOT 1.0  0.8  ALBUMIN 3.1* 3.0*    Cardiac Enzymes Recent Labs  Lab 07/29/17 1359 07/29/17 1913 07/30/17 0157  TROPONINI 0.04* 0.04* 0.12*    Glucose Recent Labs  Lab 07/29/17 1917 07/30/17 0744 07/30/17 1039  GLUCAP 110* 118* 105*    Imaging Ct Head Wo Contrast  Result Date: 07/30/2017 CLINICAL DATA:  altered level of consciousness. Fall yesterday. On Coumadin. EXAM: CT HEAD WITHOUT CONTRAST TECHNIQUE: Contiguous axial images were obtained from the base of the skull through the vertex without intravenous contrast. COMPARISON:  1 day prior FINDINGS: Brain: Moderate low density in the periventricular white matter likely related to small vessel disease. With No mass lesion, hemorrhage, hydrocephalus, acute infarct, intra-axial, or extra-axial fluid collection. Vascular: Intracranial atherosclerosis. Skull: Again identified is right supraorbital mild scalp soft tissue swelling. Normal skull. Sinuses/Orbits: Normal imaged portions of the orbits and globes. Mucosal thickening of bilateral maxillary sinuses. Mucous retention cyst or polyp in  the left sphenoid sinus. Clear mastoid air cells. Other: None. IMPRESSION: 1.  No acute intracranial abnormality. 2. Moderate small vessel ischemic change. 3. Sinus disease. 4. Minimal right supraorbital/frontal scalp soft tissue swelling, similar. Electronically Signed   By: Abigail Miyamoto M.D.   On: 07/30/2017 13:14   US Renal  Result Date: 07/29/2017 CLINICAL DATA:  Hematuria.  Stage 4 chronic kidney disease. EXAM: RENAL / URINARY TRACT ULTRASOUND COMPLETE COMPARISON:  None. FINDINGS: Right Kidney: Length: 10.7 cm. Increased renal parenchymal echogenicity. Renal cortical thinning. Small cyst arises from the anterior midpole measuring 11 mm. No other renal masses, no stones and no hydronephrosis. Left Kidney: Length: 11.3 cm. Increased parenchymal echogenicity. Mild renal cortical thinning. 14 mm midpole cyst. There is a less well-defined cystic lesion arising from  the upper pole measuring 4 x 2.5 x 3 cm with apparent wall calcification. No other renal masses. No hydronephrosis. Bladder: Bladder is mildly distended. There is diffuse irregular wall thickening without a discrete mass. No bladder stone. IMPRESSION: 1. Irregular diffuse wall thickening of the bladder consistent with infectious or inflammatory cystitis. No discrete bladder mass. 2. Findings of medical renal disease with increased renal parenchymal echogenicity. No hydronephrosis. 3. Renal cysts. One from the upper pole of the left kidney is mildly complicated. Electronically Signed   By: Lajean Manes M.D.   On: 07/29/2017 17:23     STUDIES:  CT head 1/30>>> 1.  No acute intracranial abnormality. 2. Moderate small vessel ischemic change. 3. Sinus disease. 4. Minimal right supraorbital/frontal scalp soft tissue swelling, similar.  Renal u/s 1/29>>>1. Irregular diffuse wall thickening of the bladder consistent with infectious or inflammatory cystitis. No discrete bladder mass. 2. Findings of medical renal disease with increased renal parenchymal echogenicity. No hydronephrosis. 3. Renal cysts. One from the upper pole of the left kidney is mildly complicated.  CULTURES: Urine 1/30>>>  BC x2 1/30>>>   ANTIBIOTICS: vanc 1/30>>> Zosyn 1/30>>>  SIGNIFICANT EVENTS: 1/30>> worsening hypercarbic respiratory failure and AMS, tx ICU, ETT   LINES/TUBES: ETT 1/30>>>  DISCUSSION: 84yo male with hx COPD, Afib admitted 1/30 with weakness, syncope, diarrhea, hypercarbic respiratory failure. Only mild improvement with bipap, required intubation for airway protection 1/30.   ASSESSMENT / PLAN:  PULMONARY A: Acute hypoxic / hypercarbic respiratory failure COPD on home O2 without acute exacerbation OSA- noncompliant with CPAP - ABG improved after bipap, however patient continues to have decreased mental status, inability to protect airway. P:   Will intubate now Full vent support, PRVC 8  cc/kg CXR and ABG now then trend VAP protocol  SBT in am  Hold anoro, start scheduled duonebs   CARDIOVASCULAR A:  Syncope while on toilet- ? Vagal vs dehydration Afib on coumadin Hx HTN, chronic systolic HF (EF 09% 3/81) Elevated troponin - suspect demand ischemia  P:  Tele monitoring Goal MAP 65 Continue digoxin, betapace, carvedilol Hold coumadin, change to heparin gtt per pharm  Trend troponin   RENAL A:   AKI on CKD stage IV  - renal U/S - no mass/ diffuse wall thickening c/w infectious/ inflammatory cystitis Hyperkalemia - mild  P:   Trend BMP / urinary output Replace electrolytes as indicated Avoid nephrotoxic agents, ensure adequate renal perfusion Gentle volume  May need renal input   GASTROINTESTINAL A:   N/V/D - ? Gastroenteritis, occurred after eating pork in which he does not usually eat- resolved  P:   NPO PPI for SUP Stool PCR when able  HEMATOLOGIC A:   Hx DVT coagulopathy -  on coumadin  P:  Hold coumadin per pharm Change to heparin systemically once INR <2 Trend CBC  INFECTIOUS A:   UTI- fevers, leukocytosis  P:   Follow BC and urine cultures  Trend WBC/ fever curve Tylenol for fevers GI PCR  Continue zosyn   ENDOCRINE A:   No acute issues P:   Trend glucose on BMET  NEUROLOGIC A:   Acute encephalopathy-  toxic/ metabolic related to UTI/ hypercarbia  Syncope - suspect vagal in setting vomiting/diarrhea - CT head neg  P:   RASS goal: -1/-2 PAD protocol with fentanyl, limit as able DWA   FAMILY  - Updates:  No family at bedside 1/30  - Inter-disciplinary family meet or Palliative Care meeting due by:  Day 7   Nickolas Madrid, NP 07/30/2017  3:51 PM Pager: [phone] or [phone]  Attending Note:  84 year old male with COPD and OSA who came in with a syncopal episode falling off the toilet.  Patient was given lasix in the ER and now developing renal failure.  On 1/30 patient became hypoxemic and altered  mental status with hypercarbia.  Head CT was negative.  On exam, patient is completely unresponsive.  I reviewed CXR myself, hyperinflation noted.  Patient was being treated for UTI via vanc and zosyn.  Will move to the ICU, intubated, mechanically ventilate, adjust vent for ABG.  Sedation ordered.  ABG in AM and CXR in AM.  Will hydrate and f/u in AM.  The patient is critically ill with multiple organ systems failure and requires high complexity decision making for assessment and support, frequent evaluation and titration of therapies, application of advanced monitoring technologies and extensive interpretation of multiple databases.   Critical Care Time devoted to patient care services described in this note is  45  Minutes. This time reflects time of care of this signee Dr Jennet Maduro. This critical care time does not reflect procedure time, or teaching time or supervisory time of PA/NP/Med student/Med Resident etc but could involve care discussion time.  Rush Farmer, M.D. [facility] Pulmonary/Critical Care Medicine. Pager: [phone]. After hours pager: [phone].

## 2017-07-30 NOTE — Progress Notes (Signed)
Patient placed on bipap per critical ABG results. 14/7 35%. Will repeat ABG.

## 2017-07-30 NOTE — Progress Notes (Signed)
Patient has Anoro Ellipta inhaler ordered. Patient unable to follow directions and unable to use inhaler at this time.

## 2017-07-31 ENCOUNTER — Inpatient Hospital Stay (HOSPITAL_COMMUNITY): Payer: Medicare HMO

## 2017-07-31 DIAGNOSIS — I5043 Acute on chronic combined systolic (congestive) and diastolic (congestive) heart failure: Secondary | ICD-10-CM

## 2017-07-31 DIAGNOSIS — J9621 Acute and chronic respiratory failure with hypoxia: Secondary | ICD-10-CM

## 2017-07-31 DIAGNOSIS — I340 Nonrheumatic mitral (valve) insufficiency: Secondary | ICD-10-CM

## 2017-07-31 DIAGNOSIS — R609 Edema, unspecified: Secondary | ICD-10-CM

## 2017-07-31 DIAGNOSIS — R55 Syncope and collapse: Secondary | ICD-10-CM

## 2017-07-31 LAB — GLUCOSE, CAPILLARY
GLUCOSE-CAPILLARY: 88 mg/dL (ref 65–99)
GLUCOSE-CAPILLARY: 102 mg/dL — AB (ref 65–99)
GLUCOSE-CAPILLARY: 142 mg/dL — AB (ref 65–99)
Glucose-Capillary: 90 mg/dL (ref 65–99)
Glucose-Capillary: 99 mg/dL (ref 65–99)

## 2017-07-31 LAB — CBC
HEMATOCRIT: 33.3 % — AB (ref 39.0–52.0)
HEMOGLOBIN: 10 g/dL — AB (ref 13.0–17.0)
MCH: 26.7 pg (ref 26.0–34.0)
MCHC: 30 g/dL (ref 30.0–36.0)
MCV: 88.8 fL (ref 78.0–100.0)
Platelets: 64 10*3/uL — ABNORMAL LOW (ref 150–400)
RBC: 3.75 MIL/uL — AB (ref 4.22–5.81)
RDW: 15.9 % — ABNORMAL HIGH (ref 11.5–15.5)
WBC: 16.2 10*3/uL — ABNORMAL HIGH (ref 4.0–10.5)

## 2017-07-31 LAB — URINE CULTURE

## 2017-07-31 LAB — TROPONIN I
Troponin I: 1.11 ng/mL (ref <0.03)
Troponin I: 0.9 ng/mL (ref <0.03)

## 2017-07-31 LAB — ECHOCARDIOGRAM LIMITED
HEIGHTINCHES: 76 in
Weight: 4800.74 oz

## 2017-07-31 LAB — BASIC METABOLIC PANEL
ANION GAP: 12 (ref 5–15)
BUN: 58 mg/dL — ABNORMAL HIGH (ref 6–20)
CALCIUM: 8 mg/dL — AB (ref 8.9–10.3)
CO2: 25 mmol/L (ref 22–32)
Chloride: 106 mmol/L (ref 101–111)
Creatinine, Ser: 3.85 mg/dL — ABNORMAL HIGH (ref 0.61–1.24)
GFR, EST AFRICAN AMERICAN: 16 mL/min — AB (ref >60)
GFR, EST NON AFRICAN AMERICAN: 13 mL/min — AB (ref >60)
GLUCOSE: 105 mg/dL — AB (ref 65–99)
POTASSIUM: 4.2 mmol/L (ref 3.5–5.1)
Sodium: 143 mmol/L (ref 135–145)

## 2017-07-31 LAB — PROCALCITONIN: Procalcitonin: >150 ng/mL

## 2017-07-31 LAB — PROTIME-INR
INR: 3.24
PROTHROMBIN TIME: 32.8 s — AB (ref 11.4–15.2)

## 2017-07-31 MED ORDER — CHLORHEXIDINE GLUCONATE 0.12% ORAL RINSE (MEDLINE KIT)
15.0000 mL | Freq: Two times a day (BID) | OROMUCOSAL | Status: DC
  Administered 2017-07-31: 15 mL via OROMUCOSAL

## 2017-07-31 MED ORDER — CARVEDILOL 6.25 MG PO TABS
6.2500 mg | ORAL_TABLET | Freq: Two times a day (BID) | ORAL | Status: DC
  Administered 2017-07-31 – 2017-08-04 (×8): 6.25 mg
  Filled 2017-07-31 (×9): qty 1

## 2017-07-31 MED ORDER — SOTALOL HCL 80 MG PO TABS
160.0000 mg | ORAL_TABLET | Freq: Every day | ORAL | Status: DC
  Administered 2017-08-01 – 2017-08-04 (×4): 160 mg
  Filled 2017-07-31 (×4): qty 2

## 2017-07-31 MED ORDER — CALCITRIOL 0.5 MCG PO CAPS
0.5000 ug | ORAL_CAPSULE | ORAL | Status: DC
  Administered 2017-08-01 – 2017-08-03 (×2): 0.5 ug
  Filled 2017-07-31 (×4): qty 1

## 2017-07-31 MED ORDER — ORAL CARE MOUTH RINSE: 15.0000 mL | Freq: Four times a day (QID) | OROMUCOSAL | Status: DC

## 2017-07-31 MED ORDER — MIDAZOLAM HCL 2 MG/2ML IJ SOLN
1.0000 mg | INTRAMUSCULAR | Status: DC | PRN
  Administered 2017-07-31: 1 mg via INTRAVENOUS
  Filled 2017-07-31 (×2): qty 2

## 2017-07-31 MED ORDER — IPRATROPIUM-ALBUTEROL 0.5-2.5 (3) MG/3ML IN SOLN
3.0000 mL | Freq: Three times a day (TID) | RESPIRATORY_TRACT | Status: DC
  Administered 2017-08-01 – 2017-08-04 (×9): 3 mL via RESPIRATORY_TRACT
  Filled 2017-07-31 (×10): qty 3

## 2017-07-31 MED ORDER — ORAL CARE MOUTH RINSE
15.0000 mL | Freq: Two times a day (BID) | OROMUCOSAL | Status: DC
  Administered 2017-07-31 – 2017-08-04 (×7): 15 mL via OROMUCOSAL

## 2017-07-31 MED ORDER — DIGOXIN 125 MCG PO TABS
125.0000 ug | ORAL_TABLET | Freq: Every morning | ORAL | Status: DC
  Administered 2017-08-01 – 2017-08-04 (×4): 125 ug
  Filled 2017-07-31 (×4): qty 1

## 2017-07-31 NOTE — Progress Notes (Signed)
CRITICAL VALUE ALERT  Critical Value:  Troponin 1.11 Troponin  Date & Time Notied:  07/31/17 1005  Provider Notified: Ninetta Lights  Orders Received/Actions taken: Yes

## 2017-07-31 NOTE — Progress Notes (Signed)
  Echocardiogram 2D Echocardiogram has been performed.  Ayman Brull T Omare Bilotta 07/31/2017, 3:49 PM

## 2017-07-31 NOTE — Progress Notes (Signed)
84 year old male presented with syncope.  On BiPAP for obstructive sleep apnea with encephalopathy.  Successfully extubated.  Treated for UTI.

## 2017-07-31 NOTE — Procedures (Signed)
Extubation Procedure Note  Patient Details:   Name: Draycen Leichter DOB: 1933/12/27 MRN: [id number]   Airway Documentation: Pt had audible cuff leak and following commands.  Extubated to 4 LPM [place] sat 93%, pt able to talk, HR 87 BP 103/73.  Tolerated well.    Evaluation  O2 sats: stable throughout Complications: No apparent complications Patient did tolerate procedure well. Bilateral Breath Sounds: Diminished   Yes  Ned Grace 07/31/2017, 11:51 AM

## 2017-07-31 NOTE — Progress Notes (Signed)
We just bathed patient. During bath patient would move his arms up in the air and was very anxious.  Patient did proceed to open his eyes.  After bath and pt was repositioned he began to rest again.  I spoke with resident on call and they are going to put in medication in case patient becomes very anxious and restless.  Patient has Fentanyl already.  Will follow physician's orders and be cautious with patients blood pressures before giving Versed or Fentanyl.

## 2017-07-31 NOTE — Progress Notes (Signed)
PULMONARY / CRITICAL CARE MEDICINE   Name: Maurice Delgado MRN: [id number] DOB: January 22, 1934    ADMISSION DATE:  07/29/2017 CONSULTATION DATE:  07/30/2017  REFERRING MD:  Eliseo Squires ([facility])   CHIEF COMPLAINT:  Respiratory failure   BRIEF SUMMARY:   84 year old male with history of A. fib on Coumadin, OSA on CPAP, COPD on home O2, stage IV CKD, chronic systolic heart failure (EF 30%), recent?  UTI treated with 5 days of Augmentin starting on 1/15.  He presented 1/29 with multiple vague complaints including diarrhea, weakness, fall at home related to? syncopal episode.  There was initially concern for CHF exacerbation he was diuresed  without significant change.  He now has worsening renal function, fevers and altered mental status related to acute hypercarbic respiratory failure.  He was initially trialed on BiPAP with some improvement in PCO2 on ABG but patient continued to be significantly altered, not protecting his airway.  CT head was negative for bleed (in setting fall at home on Coumadin) and CCM was consulted for transfer and probable intubation.    PAST MEDICAL HISTORY :  He  has a past medical history of Atrial tachycardia ([place]), Chronic respiratory failure with hypoxia ([place]), Chronic systolic CHF (congestive heart failure) ([place]), CKD (chronic kidney disease), stage IV ([facility]), COPD (chronic obstructive pulmonary disease) ([place]), DVT (deep venous thrombosis) ([place]), GERD (gastroesophageal reflux disease), Gout, Hemoptysis, HTN (hypertension), Hyperthyroidism, Left fibular fracture, Multiple thyroid nodules, Nonischemic cardiomyopathy ([place]), OSA on CPAP, PAF (paroxysmal atrial fibrillation) ([place]), and Retroperitoneal bleed.  PAST SURGICAL HISTORY: He  has a past surgical history that includes Cardiac defibrillator placement; Video bronchoscopy (02/26/2012); Foot surgery (Right); Tonsillectomy (1940's); Appendectomy (1940's); Vena cava filter placement (2007); supraventricular tachycardia ablation (N/A,  06/25/2013); Biv icd genertaor change out (N/A, 08/30/2014); TEE without cardioversion (N/A, 01/09/2016); and Cardioversion (N/A, 01/09/2016).  Allergies  Allergen Reactions  . Amiodarone     hyperthyroidism    No current facility-administered medications on file prior to encounter.    Current Outpatient Medications on File Prior to Encounter  Medication Sig  . Ascorbic Acid (VITAMIN C) 1000 MG tablet Take 1,000 mg by mouth every morning.   . calcitRIOL (ROCALTROL) 0.5 MCG capsule Take 0.5 mcg by mouth every other day.   . carvedilol (COREG) 6.25 MG tablet Take 1 tablet (6.25 mg total) by mouth 2 (two) times daily.  . digoxin (DIGITEK) 0.125 MG tablet Take 1 tablet (125 mcg total) by mouth every morning.  . Febuxostat (ULORIC) 80 MG TABS Take 80 mg by mouth every morning.   . NON FORMULARY [facility]  Urea Cream - 40% Urea Apply 1-2 grams to affected area 3-4 times daily Qty. 120 gm 3 refills  . OXYGEN Inhale into the lungs. Is on Oxygen all day long; uses 2 Liters a day  . pregabalin (LYRICA) 150 MG capsule Take 150 mg by mouth 2 (two) times daily.    . sotalol (BETAPACE) 160 MG tablet TAKE 1 TABLET BY MOUTH  DAILY  . spironolactone (ALDACTONE) 25 MG tablet TAKE ONE-HALF TABLET BY  MOUTH DAILY  . Tamsulosin HCl (FLOMAX) 0.4 MG CAPS Take 0.4 mg by mouth every morning.   . torsemide (DEMADEX) 20 MG tablet Take 4 tablets (80 mg) in the morning and 3 tablets (60 mg) in the afternoon  . umeclidinium-vilanterol (ANORO ELLIPTA) 62.5-25 MCG/INH AEPB TAKE 1 PUFF BY MOUTH EVERY DAY  . warfarin (COUMADIN) 5 MG tablet Take as directed by coumadin clinic (Patient taking differently: Take 5-7.5 mg by mouth  See admin instructions. Take as directed by coumadin clinic.  Tuesday, Thursday,Saturday, Sunday 5mg  . Monday, Wednesday, Friday 7.5mg )    FAMILY HISTORY:  His indicated that his mother is deceased. He indicated that his father is deceased. He indicated that the status of his neg hx is  unknown.   SOCIAL HISTORY: He  reports that he has quit smoking. His smoking use included cigarettes. He has a 45.00 pack-year smoking history. he has never used smokeless tobacco. He reports that he uses drugs. Drug: Marijuana. He reports that he does not drink alcohol.  SUBJECTIVE:  Intubated on vent. No acute overnight events.   VITAL SIGNS: BP 95/68   Pulse 70   Temp 98.6 F (37 C) (Oral)   Resp (!) 24   Ht 6\' 4"  (1.93 m)   Wt (!) 300 lb 0.7 oz (136.1 kg)   SpO2 100%   BMI 36.52 kg/m   HEMODYNAMICS:    VENTILATOR SETTINGS: Vent Mode: PRVC FiO2 (%):  [35 %-60 %] 40 % Set Rate:  [20 bmp-24 bmp] 24 bmp Vt Set:  [600 mL] 600 mL PEEP:  [5 cmH20] 5 cmH20 Plateau Pressure:  [16 cmH20-23 cmH20] 22 cmH20  INTAKE / OUTPUT: I/O last 3 completed shifts: In: 2421.7 [I.V.:1821.7; IV Piggyback:600] Out: 614 [Urine:875]  PHYSICAL EXAMINATION: General:  Elderly, chronically ill appearing male, NAD  Neuro: Awake, lethargic, follows commands Cardiovascular:  RRR, s1s2 Lungs: End expiuratory wheezing Abdomen: Obese, soft, +bs  Musculoskeletal:  Warm and dry, chronic BLE edema    LABS:  BMET Recent Labs  Lab 07/30/17 0157 07/30/17 1516 07/31/17 0350  NA 141 141 143  K 5.4* 5.3* 4.2  CL 98* 102 106  CO2 29 26 25   BUN 46* 56* 58*  CREATININE 3.73* 4.08* 3.85*  GLUCOSE 98 113* 105*    Electrolytes Recent Labs  Lab 07/30/17 0157 07/30/17 1516 07/31/17 0350  CALCIUM 8.7* 7.9* 8.0*    CBC Recent Labs  Lab 07/30/17 0157 07/30/17 1516 07/31/17 0350  WBC 26.0* 19.0* 16.2*  HGB 11.8* 11.0* 10.0*  HCT 40.6 38.7* 33.3*  PLT 103* 72* 64*    Coag's Recent Labs  Lab 07/29/17 1057 07/30/17 0157 07/31/17 0350  INR 3.35 3.17 3.24    Sepsis Markers Recent Labs  Lab 07/29/17 1417 07/29/17 1905 07/30/17 1449  LATICACIDVEN 2.09* 2.3* 1.9    ABG Recent Labs  Lab 07/30/17 1135 07/30/17 1448 07/30/17 1705  PHART 7.240* 7.288* 7.370  PCO2ART 73.4*  65.5* 52.9*  PO2ART 84.7 92.4 68.0*    Liver Enzymes Recent Labs  Lab 07/29/17 1057 07/30/17 0157  AST 25 45*  ALT 14* 20  ALKPHOS 76 61  BILITOT 1.0 0.8  ALBUMIN 3.1* 3.0*    Cardiac Enzymes Recent Labs  Lab 07/29/17 1359 07/29/17 1913 07/30/17 0157  TROPONINI 0.04* 0.04* 0.12*    Glucose Recent Labs  Lab 07/30/17 0744 07/30/17 1039 07/30/17 1659 07/30/17 2048 07/30/17 2350 07/31/17 0338  GLUCAP 118* 105* 111* 102* 100* 88    Imaging Ct Head Wo Contrast  Result Date: 07/30/2017 CLINICAL DATA:  altered level of consciousness. Fall yesterday. On Coumadin. EXAM: CT HEAD WITHOUT CONTRAST TECHNIQUE: Contiguous axial images were obtained from the base of the skull through the vertex without intravenous contrast. COMPARISON:  1 day prior FINDINGS: Brain: Moderate low density in the periventricular white matter likely related to small vessel disease. With No mass lesion, hemorrhage, hydrocephalus, acute infarct, intra-axial, or extra-axial fluid collection. Vascular: Intracranial atherosclerosis. Skull: Again identified is  right supraorbital mild scalp soft tissue swelling. Normal skull. Sinuses/Orbits: Normal imaged portions of the orbits and globes. Mucosal thickening of bilateral maxillary sinuses. Mucous retention cyst or polyp in the left sphenoid sinus. Clear mastoid air cells. Other: None. IMPRESSION: 1.  No acute intracranial abnormality. 2. Moderate small vessel ischemic change. 3. Sinus disease. 4. Minimal right supraorbital/frontal scalp soft tissue swelling, similar. Electronically Signed   By: Abigail Miyamoto M.D.   On: 07/30/2017 13:14   Dg Chest Port 1 View  Result Date: 07/30/2017 CLINICAL DATA:  Endotracheal placement. EXAM: PORTABLE CHEST 1 VIEW COMPARISON:  07/29/2017 FINDINGS: Endotracheal tube tip is 6 cm above the carina. Pacemaker/AICD remains in place. Nasogastric tube enters the abdomen. There is venous hypertension without frank edema. Heart size upper  limits of normal. Aortic atherosclerosis. No infiltrate, collapse or effusion. IMPRESSION: Endotracheal tube and nasogastric tube well positioned. Venous hypertension without frank edema, infiltrate or collapse. Electronically Signed   By: Nelson Chimes M.D.   On: 07/30/2017 16:22     STUDIES:  CT head 1/30>>> 1. No acute intracranial abnormality. 2. Moderate small vessel ischemic change. 3. Sinus disease. 4. Minimal right supraorbital/frontal scalp soft tissue swelling, similar.  Renal u/s 1/29>>>1. Irregular diffuse wall thickening of the bladder consistent with infectious or inflammatory cystitis. No discrete bladder mass. 2. Findings of medical renal disease with increased renal parenchymal echogenicity. No hydronephrosis. 3. Renal cysts. One from the upper pole of the left kidney is mildly Complicated.  CULTURES: Urine 1/30>>>  BC x2 1/30>>>  GI panel >>  ANTIBIOTICS: vanc 1/30>>> Zosyn 1/30>>>  SIGNIFICANT EVENTS: 1/30>> worsening hypercarbic respiratory failure and AMS, tx ICU, ETT   LINES/TUBES: ETT 1/30>>>  DISCUSSION: 84yo male with hx COPD, Afib admitted 1/30 with weakness, syncope, diarrhea, hypercarbic respiratory failure. Only mild improvement with bipap, required intubation for airway protection 1/30.   ASSESSMENT / PLAN:  PULMONARY A: Acute hypoxic / hypercarbic respiratory failure COPD on home O2 without acute exacerbation OSA- noncompliant with CPAP Intubated due to AMS and inability to protect airway  P: Full vent support, PRVC 8 cc/kg Daily weaning trials  CXR and ABG in am VAP protocol  SBT in am  Scheduled duonebs  CARDIOVASCULAR A: Syncope while on toilet- ? Vagal vs dehydration Afib on coumadin Hx HTN, chronic systolic HF (EF 73% 5/32) Elevated troponin - suspect demand ischemia  P: Tele monitoring Goal MAP 65 Continue digoxin, betapace, carvedilol Hold coumadin, change to heparin gtt per pharm Trend troponin    RENAL A: AKI on CKD stage IV  - Cr down to 3.8 this morning. UOP 700 cc in the past 24 hrs. Net +1.6 L since admission.  - renal U/S - no mass/ diffuse wall thickening c/w infectious/ inflammatory cystitis Hyperkalemia - resolved   P: Trend BMP / urinary output Replace electrolytes as indicated Avoid nephrotoxic agents, ensure adequate renal perfusion Gentle volume  May need renal input   GASTROINTESTINAL A: N/V/D - ? Gastroenteritis, occurred after eating pork in which he does not usually eat- resolved P: NPO PPI for SUP Stool PCR when able  HEMATOLOGIC A: Hx DVT Coagulopathy - on coumadin  ACD in the setting of CKD stage IV Acute on chronic thrombocytopenia - platelets trending down, 64,000 today P: Hold coumadin per pharm. INR 3.2 this morning.  Change to heparin systemically once INR <2 Trend CBC  INFECTIOUS A: ?UTI- fevers with Tmax 100.5 in the past 24 hrs, afebrile this morning. White count trending down, 16.2 today.  P: Follow BC and urine cultures  Trend WBC/ fever curve Tylenol for fevers GI PCR  Continue vanc and zosyn  ENDOCRINE A: No acute issues P: Trend glucose on BMET  NEUROLOGIC A: Acute encephalopathy- toxic/ metabolic related to ?UTI/ hypercarbia  Syncope - suspect vagal in setting vomiting/diarrhea - CT head neg  P: RASS goal:-1/-2 PAD protocol with fentanyl, limit as able DWA  FAMILY  - Updates:  No family at bedside 1/31  Attending Note:  84 year old male with CHF and COPD who was in acute encephalopathy due to hypercarbia from COPD exacerbation.  Patient was intubated on 1/30 and appears more improved today.  On exam, lungs with some coarse BS.  I reviewed CXR myself, ETT is in good position with pulmonary edema noted.  Discussed with PCCM-NP.  Will KVO IVF.  Hold of lasix for today.  Echo pending.  Patient is not wheezing so no need for steroids.  Will proceed with extubation today.  Check  procalcitonin level, if negative then will consider deescalating to rocephin only for the presumed UTI even though U/A is negative.  PCCM will continue to follow.  The patient is critically ill with multiple organ systems failure and requires high complexity decision making for assessment and support, frequent evaluation and titration of therapies, application of advanced monitoring technologies and extensive interpretation of multiple databases.   Critical Care Time devoted to patient care services described in this note is  35  Minutes. This time reflects time of care of this signee Dr Jennet Maduro. This critical care time does not reflect procedure time, or teaching time or supervisory time of PA/NP/Med student/Med Resident etc but could involve care discussion time.  Rush Farmer, M.D. [facility] Pulmonary/Critical Care Medicine. Pager: [phone]. After hours pager: [phone].

## 2017-08-01 ENCOUNTER — Inpatient Hospital Stay (HOSPITAL_COMMUNITY): Payer: Medicare HMO

## 2017-08-01 DIAGNOSIS — R5381 Other malaise: Secondary | ICD-10-CM

## 2017-08-01 DIAGNOSIS — I480 Paroxysmal atrial fibrillation: Secondary | ICD-10-CM

## 2017-08-01 DIAGNOSIS — Z950 Presence of cardiac pacemaker: Secondary | ICD-10-CM

## 2017-08-01 LAB — CBC
HCT: 34 % — ABNORMAL LOW (ref 39.0–52.0)
Hemoglobin: 10.3 g/dL — ABNORMAL LOW (ref 13.0–17.0)
MCH: 26.8 pg (ref 26.0–34.0)
MCHC: 30.3 g/dL (ref 30.0–36.0)
MCV: 88.3 fL (ref 78.0–100.0)
Platelets: 76 10*3/uL — ABNORMAL LOW (ref 150–400)
RBC: 3.85 MIL/uL — ABNORMAL LOW (ref 4.22–5.81)
RDW: 15.7 % — ABNORMAL HIGH (ref 11.5–15.5)
WBC: 17.4 10*3/uL — ABNORMAL HIGH (ref 4.0–10.5)

## 2017-08-01 LAB — BASIC METABOLIC PANEL
Anion gap: 10 (ref 5–15)
BUN: 56 mg/dL — AB (ref 6–20)
CHLORIDE: 108 mmol/L (ref 101–111)
CO2: 27 mmol/L (ref 22–32)
CREATININE: 3.05 mg/dL — AB (ref 0.61–1.24)
Calcium: 8.6 mg/dL — ABNORMAL LOW (ref 8.9–10.3)
GFR calc Af Amer: 21 mL/min — ABNORMAL LOW (ref >60)
GFR, EST NON AFRICAN AMERICAN: 18 mL/min — AB (ref >60)
GLUCOSE: 101 mg/dL — AB (ref 65–99)
Potassium: 4.4 mmol/L (ref 3.5–5.1)
SODIUM: 145 mmol/L (ref 135–145)

## 2017-08-01 LAB — MAGNESIUM: MAGNESIUM: 2 mg/dL (ref 1.7–2.4)

## 2017-08-01 LAB — PROTIME-INR
INR: 3.12
PROTHROMBIN TIME: 31.9 s — AB (ref 11.4–15.2)

## 2017-08-01 LAB — PHOSPHORUS: Phosphorus: 3.5 mg/dL (ref 2.5–4.6)

## 2017-08-01 MED ORDER — DEXTROSE 5 % IV SOLN
1.0000 g | INTRAVENOUS | Status: DC
  Administered 2017-08-01 – 2017-08-03 (×3): 1 g via INTRAVENOUS
  Filled 2017-08-01 (×4): qty 10

## 2017-08-01 MED ORDER — WARFARIN - PHARMACIST DOSING INPATIENT
Freq: Every day | Status: DC
  Administered 2017-08-02 – 2017-08-03 (×2)

## 2017-08-01 NOTE — Evaluation (Signed)
Occupational Therapy Evaluation Patient Details Name: Maurice Delgado MRN: [id number] DOB: 10-Jan-1934 Today's Date: 08/01/2017    History of Present Illness Pt adm with weakness, fall, and ?syncope. Pt with acute hypoxic respiratory failure and intubated 1/29. Extubated 1/31. PMH - chf, AICD, gout, ckd, DVT, home O2 use   Clinical Impression   Pt admitted with syncope. Pt currently with functional limitations due to the deficits listed below (see OT Problem List).  Pt will benefit from skilled OT to increase their safety and independence with ADL and functional mobility for ADL to facilitate discharge to venue listed below.      Follow Up Recommendations  CIR    Equipment Recommendations  None recommended by OT    Recommendations for Other Services Rehab consult     Precautions / Restrictions Precautions Precautions: Fall Restrictions Weight Bearing Restrictions: No      Mobility Bed Mobility Overal bed mobility: Needs Assistance Bed Mobility: Rolling;Sit to Supine Rolling: Max assist     Sit to supine: +2 for safety/equipment;Total assist      Transfers Overall transfer level: Needs assistance Equipment used: Rolling walker (2 wheeled) Transfers: Sit to/from Stand Sit to Stand: +2 physical assistance;Max assist         General transfer comment: pt sitting in recliner     Balance Overall balance assessment: Needs assistance Sitting-balance support: Bilateral upper extremity supported;Feet supported Sitting balance-Leahy Scale: Poor Sitting balance - Comments: UE support and min guard sitting EOB   Standing balance support: Bilateral upper extremity supported Standing balance-Leahy Scale: Poor Standing balance comment: walker and +2 min assist for static standing                           ADL either performed or assessed with clinical judgement   ADL Overall ADL's : Needs assistance/impaired Eating/Feeding: Set up;Sitting   Grooming:  Minimal assistance;Sitting   Upper Body Bathing: Minimal assistance;Sitting   Lower Body Bathing: +2 for physical assistance;Total assistance;+2 for safety/equipment;Cueing for safety;Cueing for sequencing;Sit to/from stand   Upper Body Dressing : Minimal assistance;Sitting   Lower Body Dressing: Total assistance;+2 for safety/equipment;+2 for physical assistance;Sit to/from stand;Cueing for safety   Toilet Transfer: +2 for physical assistance;+2 for safety/equipment;Maximal assistance;Comfort height toilet;BSC;RW;Cueing for sequencing;Cueing for safety   Toileting- Clothing Manipulation and Hygiene: Sit to/from stand;+2 for safety/equipment;+2 for physical assistance;Total assistance         General ADL Comments: pt needed significant A with toileting task     Vision Patient Visual Report: No change from baseline              Pertinent Vitals/Pain Pain Score: 4  Faces Pain Scale: Hurts little more Pain Location: pt complaining about foley- CNa aware     Hand Dominance     Extremity/Trunk Assessment Upper Extremity Assessment Upper Extremity Assessment: Generalized weakness           Communication Communication Communication: No difficulties   Cognition Arousal/Alertness: Awake/alert Behavior During Therapy: WFL for tasks assessed/performed Overall Cognitive Status: No family/caregiver present to determine baseline cognitive functioning Area of Impairment: Attention;Memory;Following commands;Problem solving                   Current Attention Level: Sustained Memory: Decreased short-term memory Following Commands: Follows multi-step commands with increased time     Problem Solving: Slow processing;Decreased initiation;Requires verbal cues;Requires tactile cues General Comments: pt needed encouraged and increased time to perform task   General Comments  Home Living Family/patient expects to be discharged to:: Private  residence Living Arrangements: Spouse/significant other;Children Available Help at Discharge: Family;Available 24 hours/day Type of Home: House Home Access: Level entry     Home Layout: Two level;Able to live on main level with bedroom/bathroom Alternate Level Stairs-Number of Steps: flight   Bathroom Shower/Tub: Tub/shower unit;Walk-in shower   Bathroom Toilet: Handicapped height     Home Equipment: Helix - single point;Walker - 2 wheels;Walker - 4 wheels          Prior Functioning/Environment Level of Independence: Needs assistance  Gait / Transfers Assistance Needed: Per pt he amb without assistance and uses cane              OT Problem List: Decreased strength;Decreased activity tolerance;Impaired balance (sitting and/or standing);Decreased knowledge of use of DME or AE;Obesity      OT Treatment/Interventions: Self-care/ADL training;Patient/family education;DME and/or AE instruction;Energy conservation    OT Goals(Current goals can be found in the care plan section) Acute Rehab OT Goals Patient Stated Goal: return home OT Goal Formulation: With patient Time For Goal Achievement: 08/15/17 Potential to Achieve Goals: Good ADL Goals Pt Will Perform Lower Body Bathing: with min assist;sit to/from stand Pt Will Perform Lower Body Dressing: with min assist;sit to/from stand Pt Will Transfer to Toilet: with min assist;stand pivot transfer Pt Will Perform Toileting - Clothing Manipulation and hygiene: with min assist;sit to/from stand  OT Frequency: Min 2X/week    AM-PAC PT "6 Clicks" Daily Activity     Outcome Measure Help from another person eating meals?: A Little Help from another person taking care of personal grooming?: A Little Help from another person toileting, which includes using toliet, bedpan, or urinal?: Total Help from another person bathing (including washing, rinsing, drying)?: A Lot Help from another person to put on and taking off regular upper body  clothing?: A Lot Help from another person to put on and taking off regular lower body clothing?: Total 6 Click Score: 12   End of Session Equipment Utilized During Treatment: Rolling walker Nurse Communication: Mobility status  Activity Tolerance: Patient limited by lethargy Patient left: in bed;with call bell/phone within reach;with bed alarm set;with nursing/sitter in room  OT Visit Diagnosis: Unsteadiness on feet (R26.81);Muscle weakness (generalized) (M62.81);History of falling (Z91.81)                Time: 5498-2641 OT Time Calculation (min): 23 min Charges:  OT General Charges $OT Visit: 1 Visit OT Evaluation $OT Eval Moderate Complexity: 1 Mod G-Codes:     Kari Baars, [place]  Payton Mccallum D 08/01/2017, 3:49 PM

## 2017-08-01 NOTE — Progress Notes (Signed)
Physical medicine rehabilitation consult requested chart reviewed. Patient just began mobilizing today and should continue to progress nicely. Continue to follow with appropriate recommendations

## 2017-08-01 NOTE — Evaluation (Signed)
Physical Therapy Evaluation Patient Details Name: Maurice Delgado MRN: [id number] DOB: 04/30/1934 Today's Date: 08/01/2017   History of Present Illness  Pt adm with weakness, fall, and ?syncope. Pt with acute hypoxic respiratory failure and intubated 1/29. Extubated 1/31. PMH - chf, AICD, gout, ckd, DVT, home O2 use  Clinical Impression  Pt presents to PT with significant limitations in mobility due to weakness and poor balance. Expect pt to make steady progress and recommend CIR prior to return home. Pt agreeable to further rehab if needed.     Follow Up Recommendations CIR    Equipment Recommendations  None recommended by PT    Recommendations for Other Services       Precautions / Restrictions Precautions Precautions: Fall Restrictions Weight Bearing Restrictions: No      Mobility  Bed Mobility Overal bed mobility: Needs Assistance Bed Mobility: Rolling;Sidelying to Sit Rolling: +2 for physical assistance;Mod assist Sidelying to sit: +2 for physical assistance;Mod assist       General bed mobility comments: Assist to bring shoulders/hips over to roll. Assist to elevate trunk into sitting and to bring hips to EOB. Pt using rail. Pt with incr difficulty moving due to testicles getting caught underneath him  Transfers Overall transfer level: Needs assistance Equipment used: Rolling walker (2 wheeled) Transfers: Sit to/from Stand Sit to Stand: +2 physical assistance;Min assist;From elevated surface         General transfer comment: Assist to bring hips up and for balance. Verbal cues for hand placement. Pt slow to rise.  Ambulation/Gait Ambulation/Gait assistance: +2 physical assistance;Min assist Ambulation Distance (Feet): 2 Feet Assistive device: Rolling walker (2 wheeled) Gait Pattern/deviations: Step-to pattern;Decreased step length - right;Decreased step length - left;Shuffle;Trunk flexed;Wide base of support Gait velocity: decr Gait velocity interpretation:  Below normal speed for age/gender General Gait Details: Shuffling steps from bed to recliner. Assist for balance and support. Verbal cues to stand more upright.  Stairs            Wheelchair Mobility    Modified Rankin (Stroke Patients Only)       Balance Overall balance assessment: Needs assistance Sitting-balance support: Bilateral upper extremity supported;Feet supported Sitting balance-Leahy Scale: Poor Sitting balance - Comments: UE support and min guard sitting EOB   Standing balance support: Bilateral upper extremity supported Standing balance-Leahy Scale: Poor Standing balance comment: walker and +2 min assist for static standing                             Pertinent Vitals/Pain Pain Assessment: Faces Faces Pain Scale: Hurts little more Pain Location: rt foot? pt denies pain but grimaces when rt foot touched or moved Pain Descriptors / Indicators: Grimacing Pain Intervention(s): Limited activity within patient's tolerance;Monitored during session;Repositioned    Home Living Family/patient expects to be discharged to:: Private residence Living Arrangements: Spouse/significant other;Children Available Help at Discharge: Family;Available 24 hours/day Type of Home: House Home Access: Level entry     Home Layout: Two level;Able to live on main level with bedroom/bathroom Home Equipment: Kasandra Knudsen - single point;Walker - 2 wheels;Walker - 4 wheels      Prior Function Level of Independence: Needs assistance   Gait / Transfers Assistance Needed: Per pt he amb without assistance and uses cane           Hand Dominance        Extremity/Trunk Assessment   Upper Extremity Assessment Upper Extremity Assessment: Defer to OT evaluation  Lower Extremity Assessment Lower Extremity Assessment: Generalized weakness       Communication   Communication: No difficulties  Cognition Arousal/Alertness: Awake/alert Behavior During Therapy: WFL for tasks  assessed/performed Overall Cognitive Status: No family/caregiver present to determine baseline cognitive functioning Area of Impairment: Attention;Memory;Following commands;Problem solving                   Current Attention Level: Sustained Memory: Decreased short-term memory Following Commands: Follows multi-step commands with increased time     Problem Solving: Slow processing;Decreased initiation;Requires verbal cues;Requires tactile cues        General Comments      Exercises     Assessment/Plan    PT Assessment Patient needs continued PT services  PT Problem List Decreased strength;Decreased activity tolerance;Decreased balance;Decreased mobility;Decreased cognition;Obesity       PT Treatment Interventions DME instruction;Gait training;Functional mobility training;Therapeutic activities;Therapeutic exercise;Balance training;Patient/family education    PT Goals (Current goals can be found in the Care Plan section)  Acute Rehab PT Goals Patient Stated Goal: return home PT Goal Formulation: With patient Time For Goal Achievement: 08/15/17 Potential to Achieve Goals: Good    Frequency Min 3X/week   Barriers to discharge        Co-evaluation               AM-PAC PT "6 Clicks" Daily Activity  Outcome Measure Difficulty turning over in bed (including adjusting bedclothes, sheets and blankets)?: Unable Difficulty moving from lying on back to sitting on the side of the bed? : Unable Difficulty sitting down on and standing up from a chair with arms (e.g., wheelchair, bedside commode, etc,.)?: Unable Help needed moving to and from a bed to chair (including a wheelchair)?: A Lot Help needed walking in hospital room?: A Lot Help needed climbing 3-5 steps with a railing? : Total 6 Click Score: 8    End of Session Equipment Utilized During Treatment: Gait belt;Oxygen Activity Tolerance: Patient limited by fatigue Patient left: in chair;with call bell/phone  within reach;with chair alarm set Nurse Communication: Mobility status PT Visit Diagnosis: Unsteadiness on feet (R26.81);Other abnormalities of gait and mobility (R26.89);Muscle weakness (generalized) (M62.81)    Time: 1610-9604 PT Time Calculation (min) (ACUTE ONLY): 33 min   Charges:   PT Evaluation $PT Eval Moderate Complexity: 1 Mod PT Treatments $Therapeutic Activity: 8-22 mins   PT G Codes:        [facility] PT [place] 08/01/2017, 10:38 AM

## 2017-08-01 NOTE — Consult Note (Signed)
Physical Medicine and Rehabilitation Consult Reason for Consult: Decreased functional mobility Referring Physician: [facility]   HPI: Maurice Delgado is a 84 y.o. right handed male with history of obesity, atrial fibrillation on chronic Coumadin. COPD with home oxygen, stage IV CKD, chronic systolic congestive heart failure. Per chart review patient lives with wife and daughter. Used a cane prior to admission as well as chronic oxygen. Wife can assist but limited physically. Daughter works during the day. Presented 07/29/2017 with fall question syncope as well as multiple episodes of diarrhea. WBC 12,700, creatinine 2.99 from baseline 2.14, troponin negative, lactic acid 2.3. Cranial CT scan negative, renal ultrasound no hydronephrosis. Chest x-ray concerning for mild CHF. Patient did require intubation for a short time for respiratory failure and extubated 07/31/2017. He remains on chronic Coumadin therapy. Physical therapy evaluation completed with recommendations of physical medicine rehabilitation consult.  Patient reports being able to dress and bathe himself prior to admission ambulated with either a cane or walker or walking stick depending on how he felt.  He did not recall being up with physical therapy on 2/1 and was distressed that he could not remember   Review of Systems  Constitutional: Negative for chills and fever.  HENT: Negative for hearing loss.   Eyes: Negative for blurred vision and double vision.  Respiratory: Positive for shortness of breath.   Cardiovascular: Positive for palpitations and leg swelling.  Gastrointestinal: Positive for diarrhea. Negative for nausea.       GERD  Genitourinary: Positive for urgency.  Musculoskeletal: Positive for falls and myalgias.  Skin: Negative for rash.  Neurological: Negative for seizures.  All other systems reviewed and are negative.  Past Medical History:  Diagnosis Date  . Atrial tachycardia ([place])    a. s/p ablation  05/2013.  Marland Kitchen Chronic respiratory failure with hypoxia ([place])    a. On home O2 - 2-3L 24/7  . Chronic systolic CHF (congestive heart failure) ([place])   . CKD (chronic kidney disease), stage IV ([place])   . COPD (chronic obstructive pulmonary disease) ([place])   . DVT (deep venous thrombosis) ([place])    a. Recurrent DVTs. b. s/p IVC filter 2007. c. DVTs dx 05/2013 - started back on Coumadin  . GERD (gastroesophageal reflux disease)   . Gout   . Hemoptysis    a. H/o of hemoptysis with pulmonary fibrosis (patient has refused biopsies in the past).  Marland Kitchen HTN (hypertension)   . 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  . Left fibular fracture    a. 03/2013 - nonsurgical mgmt.  . Multiple thyroid nodules   . Nonischemic cardiomyopathy (H[facility])    a. EF 35%. b. s/p CRT-D device implanted - initially 2002 in [place], gen change 2011 (MDT).  . OSA on CPAP   . PAF (paroxysmal atrial fibrillation) ([place])   . Retroperitoneal bleed    a. Remotely on Coumadin (per Dr. Jackalyn Lombard prior office note).   Past Surgical History:  Procedure Laterality Date  . APPENDECTOMY  1940's   "I think so"  . BIV ICD GENERTAOR CHANGE OUT N/A 08/30/2014   Procedure: BIV ICD GENERTAOR CHANGE OUT;  Surgeon: Thompson Grayer, MD;  Location: [facility] CATH LAB;  Service: Cardiovascular;  Laterality: N/A;  . CARDIAC DEFIBRILLATOR PLACEMENT     initial BiV ICD 09/22/00 in [place].  Gen change (MDT) by Dr Leonia Reeves 10/30/09  . CARDIOVERSION N/A 01/09/2016   Procedure: CARDIOVERSION;  Surgeon: Larey Dresser, MD;  Location: [place];  Service:  Cardiovascular;  Laterality: N/A;  . FOOT SURGERY Right    "removed a knot off top of my foot" (06/21/2013)  . SUPRAVENTRICULAR TACHYCARDIA ABLATION N/A 06/25/2013   Procedure: SUPRAVENTRICULAR TACHYCARDIA ABLATION;  Surgeon: Evans Lance, MD;  Location: [facility] CATH LAB;  Service: Cardiovascular;  Laterality: N/A;  . TEE WITHOUT CARDIOVERSION N/A 01/09/2016   Procedure: TRANSESOPHAGEAL ECHOCARDIOGRAM (TEE);  Surgeon: Larey Dresser, MD;  Location: [place];  Service: Cardiovascular;  Laterality: N/A;  . TONSILLECTOMY  1940's  . VENA CAVA FILTER PLACEMENT  2007   [name] 05/25/2013 (06/21/2013)  . VIDEO BRONCHOSCOPY  02/26/2012   Procedure: VIDEO BRONCHOSCOPY WITHOUT FLUORO;  Surgeon: Kathee Delton, MD;  Location: [facility];  Service: Cardiopulmonary;  Laterality: Bilateral;   Family History  Problem Relation Age of Onset  . Heart disease Mother        mother also had Rheumatism.  Marland Kitchen Hypertension Mother   . Heart attack Neg Hx   . Stroke Neg Hx    Social History:  reports that he has quit smoking. His smoking use included cigarettes. He has a 45.00 pack-year smoking history. he has never used smokeless tobacco. He reports that he uses drugs. Drug: Marijuana. He reports that he does not drink alcohol. Allergies:  Allergies  Allergen Reactions  . Amiodarone     hyperthyroidism   Medications Prior to Admission  Medication Sig Dispense Refill  . Ascorbic Acid (VITAMIN C) 1000 MG tablet Take 1,000 mg by mouth every morning.     . calcitRIOL (ROCALTROL) 0.5 MCG capsule Take 0.5 mcg by mouth every other day.     . carvedilol (COREG) 6.25 MG tablet Take 1 tablet (6.25 mg total) by mouth 2 (two) times daily. 180 tablet 3  . digoxin (DIGITEK) 0.125 MG tablet Take 1 tablet (125 mcg total) by mouth every morning. 90 tablet 3  . Febuxostat (ULORIC) 80 MG TABS Take 80 mg by mouth every morning.     . NON FORMULARY [facility]  Urea Cream - 40% Urea Apply 1-2 grams to affected area 3-4 times daily Qty. 120 gm 3 refills    . OXYGEN Inhale into the lungs. Is on Oxygen all day long; uses 2 Liters a day    . pregabalin (LYRICA) 150 MG capsule Take 150 mg by mouth 2 (two) times daily.      . sotalol (BETAPACE) 160 MG tablet TAKE 1 TABLET BY MOUTH  DAILY 90 tablet 3  . spironolactone (ALDACTONE) 25 MG tablet TAKE ONE-HALF TABLET BY  MOUTH DAILY 45 tablet 3  . Tamsulosin HCl (FLOMAX) 0.4 MG CAPS Take 0.4 mg by  mouth every morning.     . torsemide (DEMADEX) 20 MG tablet Take 4 tablets (80 mg) in the morning and 3 tablets (60 mg) in the afternoon 630 tablet 3  . umeclidinium-vilanterol (ANORO ELLIPTA) 62.5-25 MCG/INH AEPB TAKE 1 PUFF BY MOUTH EVERY DAY 180 each 1  . warfarin (COUMADIN) 5 MG tablet Take as directed by coumadin clinic (Patient taking differently: Take 5-7.5 mg by mouth See admin instructions. Take as directed by coumadin clinic.  Tuesday, Thursday,Saturday, Sunday 5mg  . Monday, Wednesday, Friday 7.5mg ) 150 tablet 1    Home: Home Living Family/patient expects to be discharged to:: Private residence Living Arrangements: Spouse/significant other, Children Available Help at Discharge: Family, Available 24 hours/day Type of Home: House Home Access: Level entry Home Layout: Two level, Able to live on main level with bedroom/bathroom Alternate Level Stairs-Number of Steps: flight Bathroom  Shower/Tub: Public librarian, Multimedia programmer: Handicapped height Home Equipment: [place] - single point, Environmental consultant - 2 wheels, Environmental consultant - 4 wheels  Functional History: Prior Function Level of Independence: Needs assistance Gait / Transfers Assistance Needed: Per pt he amb without assistance and uses cane Functional Status:  Mobility: Bed Mobility Overal bed mobility: Needs Assistance Bed Mobility: Sit to Supine, Rolling Rolling: +2 for physical assistance, Mod assist Sidelying to sit: +2 for physical assistance, Mod assist Sit to supine: +2 for safety/equipment, Total assist General bed mobility comments: Assist to bring shoulders/hips over to roll. Assist to elevate trunk into sitting and to bring hips to EOB. Pt using rail. Pt with incr difficulty moving due to testicles getting caught underneath him Transfers Overall transfer level: Needs assistance Equipment used: Rolling walker (2 wheeled) Transfers: Sit to/from Stand Sit to Stand: +2 physical assistance, Min assist, From elevated  surface General transfer comment: Assist to bring hips up and for balance. Verbal cues for hand placement. Pt slow to rise. Ambulation/Gait Ambulation/Gait assistance: +2 physical assistance, Min assist Ambulation Distance (Feet): 2 Feet Assistive device: Rolling walker (2 wheeled) Gait Pattern/deviations: Step-to pattern, Decreased step length - right, Decreased step length - left, Shuffle, Trunk flexed, Wide base of support General Gait Details: Shuffling steps from bed to recliner. Assist for balance and support. Verbal cues to stand more upright. Gait velocity: decr Gait velocity interpretation: Below normal speed for age/gender    ADL:    Cognition: Cognition Overall Cognitive Status: No family/caregiver present to determine baseline cognitive functioning Orientation Level: Oriented to person, Oriented to place, Disoriented to situation, Oriented to time Cognition Arousal/Alertness: Awake/alert Behavior During Therapy: WFL for tasks assessed/performed Overall Cognitive Status: No family/caregiver present to determine baseline cognitive functioning Area of Impairment: Attention, Memory, Following commands, Problem solving Current Attention Level: Sustained Memory: Decreased short-term memory Following Commands: Follows multi-step commands with increased time Problem Solving: Slow processing, Decreased initiation, Requires verbal cues, Requires tactile cues General Comments: pt needed encouraged and increased time to perform task  Blood pressure 126/74, pulse 68, temperature (!) 97.5 F (36.4 C), temperature source Oral, resp. rate 18, height 6\' 4"  (1.93 m), weight (!) 136.8 kg (301 lb 9.4 oz), SpO2 94 %. Physical Exam  HENT:  Head: Normocephalic.  Eyes: EOM are normal. Right eye exhibits no discharge. Left eye exhibits no discharge.  Neck: Normal range of motion. Neck supple. No thyromegaly present.  Cardiovascular:  Cardiac rate controlled  Respiratory:  Decreased breath  sounds at the bases but clear to auscultation  GI: Soft. Bowel sounds are normal. He exhibits no distension.  Neurological: He is alert.  He provides his name, age and date of birth. Follow simple commands. He cannot recall full events leading up to his hospital admission  Skin: Skin is warm and dry.  Motor strength is 3- in the left deltoid 5 in the left bicep tricep grip 5 in the right deltoid bicep tricep grip 3- at bilateral hip flexors 4 at bilateral knee extensors and 4- bilateral ankle dorsiflexors and plantar flexor Right foot has Charcot deformity with large navicular callus Intact sensation to light touch bilateral upper and lower limbs Left shoulder with limited abduction and forward flexion Results for orders placed or performed during the hospital encounter of 07/29/17 (from the past 24 hour(s))  Troponin I     Status: Abnormal   Collection Time: 07/31/17  2:53 PM  Result Value Ref Range   Troponin I 0.90 (HH) <0.03 ng/mL  Glucose,  capillary     Status: Abnormal   Collection Time: 07/31/17  4:00 PM  Result Value Ref Range   Glucose-Capillary 102 (H) 65 - 99 mg/dL   Comment 1 Capillary Specimen   Glucose, capillary     Status: Abnormal   Collection Time: 07/31/17  8:03 PM  Result Value Ref Range   Glucose-Capillary 142 (H) 65 - 99 mg/dL   Comment 1 Capillary Specimen    Comment 2 Notify RN   Protime-INR     Status: Abnormal   Collection Time: 08/01/17  5:10 AM  Result Value Ref Range   Prothrombin Time 31.9 (H) 11.4 - 15.2 seconds   INR 3.12   CBC     Status: Abnormal   Collection Time: 08/01/17  5:10 AM  Result Value Ref Range   WBC 17.4 (H) 4.0 - 10.5 K/uL   RBC 3.85 (L) 4.22 - 5.81 MIL/uL   Hemoglobin 10.3 (L) 13.0 - 17.0 g/dL   HCT 34.0 (L) 39.0 - 52.0 %   MCV 88.3 78.0 - 100.0 fL   MCH 26.8 26.0 - 34.0 pg   MCHC 30.3 30.0 - 36.0 g/dL   RDW 15.7 (H) 11.5 - 15.5 %   Platelets 76 (L) 150 - 400 K/uL  Basic metabolic panel     Status: Abnormal   Collection  Time: 08/01/17  5:10 AM  Result Value Ref Range   Sodium 145 135 - 145 mmol/L   Potassium 4.4 3.5 - 5.1 mmol/L   Chloride 108 101 - 111 mmol/L   CO2 27 22 - 32 mmol/L   Glucose, Bld 101 (H) 65 - 99 mg/dL   BUN 56 (H) 6 - 20 mg/dL   Creatinine, Ser 3.05 (H) 0.61 - 1.24 mg/dL   Calcium 8.6 (L) 8.9 - 10.3 mg/dL   GFR calc non Af Amer 18 (L) >60 mL/min   GFR calc Af Amer 21 (L) >60 mL/min   Anion gap 10 5 - 15  Magnesium     Status: None   Collection Time: 08/01/17  5:10 AM  Result Value Ref Range   Magnesium 2.0 1.7 - 2.4 mg/dL  Phosphorus     Status: None   Collection Time: 08/01/17  5:10 AM  Result Value Ref Range   Phosphorus 3.5 2.5 - 4.6 mg/dL   Dg Chest Port 1 View  Result Date: 08/01/2017 CLINICAL DATA:  Acute respiratory failure EXAM: PORTABLE CHEST - 1 VIEW COMPARISON:  the previous day's study FINDINGS: Patient has been extubated and the nasogastric tube removed. Left subclavian AICD stable. Mild bibasilar interstitial edema or infiltrates have slightly increased. Suspect mild central pulmonary vascular congestion. Heart size and mediastinal contours are within normal limits. Aortic Atherosclerosis (ICD10-170.0) No definite effusion although the lateral costophrenic angles are partially excluded. No pneumothorax. Visualized bones unremarkable. IMPRESSION: 1. Extubation with slight worsening of bibasilar interstitial edema or infiltrates. Electronically Signed   By: Lucrezia Europe M.D.   On: 08/01/2017 08:14   Dg Chest Port 1 View  Result Date: 07/31/2017 CLINICAL DATA:  Respiratory failure.  History of COPD. EXAM: PORTABLE CHEST 1 VIEW COMPARISON:  07/30/2017. FINDINGS: Lines and tubes appear to be in stable position. Most portion hemidiaphragms and costophrenic angles not imaged. AICD in stable position. Stable cardiomegaly. Diffuse bilateral pulmonary interstitial prominence noted. Mild CHF cannot be excluded. No pleural effusion or pneumothorax. IMPRESSION: 1.  Lines and tubes appear  to be in stable position. 2. AICD in stable position. Cardiomegaly with pulmonary venous congestion  and mild bilateral interstitial prominence. Mild CHF cannot be excluded on today's exam. Electronically Signed   By: Marcello Moores  Register   On: 07/31/2017 09:04   Dg Chest Port 1 View  Result Date: 07/30/2017 CLINICAL DATA:  Endotracheal placement. EXAM: PORTABLE CHEST 1 VIEW COMPARISON:  07/29/2017 FINDINGS: Endotracheal tube tip is 6 cm above the carina. Pacemaker/AICD remains in place. Nasogastric tube enters the abdomen. There is venous hypertension without frank edema. Heart size upper limits of normal. Aortic atherosclerosis. No infiltrate, collapse or effusion. IMPRESSION: Endotracheal tube and nasogastric tube well positioned. Venous hypertension without frank edema, infiltrate or collapse. Electronically Signed   By: Nelson Chimes M.D.   On: 07/30/2017 16:22    Assessment/Plan: Diagnosis: Deconditioning after respiratory failure in a patient with history of fall as well as CHF with chronic lower extremity edema 1. Does the need for close, 24 hr/day medical supervision in concert with the patient's rehab needs make it unreasonable for this patient to be served in a less intensive setting? Yes 2. Co-Morbidities requiring supervision/potential complications: Atrial fibrillation on Coumadin, obstructive sleep apnea on CPAP, hypertension, COPD, stage IV CKD, shoulder contracture 3. Due to bladder management, bowel management, safety, skin/wound care, disease management, medication administration, pain management and patient education, does the patient require 24 hr/day rehab nursing? Yes 4. Does the patient require coordinated care of a physician, rehab nurse, PT (1-2 hrs/day, 5 days/week), OT (1-2 hrs/day, 5 days/week) and SLP (.5-1 hrs/day, 5 days/week) to address physical and functional deficits in the context of the above medical diagnosis(es)? Yes Addressing deficits in the following areas: balance,  endurance, locomotion, strength, transferring, bowel/bladder control, bathing, dressing, feeding, grooming, toileting, cognition and psychosocial support 5. Can the patient actively participate in an intensive therapy program of at least 3 hrs of therapy per day at least 5 days per week? Yes 6. The potential for patient to make measurable gains while on inpatient rehab is good 7. Anticipated functional outcomes upon discharge from inpatient rehab are min assist  with PT, min assist with OT, modified independent with SLP. 8. Estimated rehab length of stay to reach the above functional goals is: 14-17d 9. Anticipated D/C setting: Home 10. Anticipated post D/C treatments: [place] therapy 11. Overall Rehab/Functional Prognosis: good  RECOMMENDATIONS: This patient's condition is appropriate for continued rehabilitative care in the following setting: CIR Patient has agreed to participate in recommended program. Yes Note that insurance prior authorization may be required for reimbursement for recommended care.  Comment: Command speech to do cognitive evaluation given his poor memory for prior PT OT treatments 2 days ago  Charlett Blake M.D. [facility] FAAPM&R (Sports Med, Neuromuscular Med) Diplomate Am Board of Electrodiagnostic Med  Cathlyn Parsons, PA-C 08/01/2017

## 2017-08-01 NOTE — Progress Notes (Signed)
[place] for warfarin Indication: atrial fibrillation  Allergies  Allergen Reactions  . Amiodarone     hyperthyroidism    Patient Measurements: Height: 6\' 4"  (193 cm) Weight: (!) 301 lb 9.4 oz (136.8 kg) IBW/kg (Calculated) : 86.8  Vital Signs: Temp: 97.5 F (36.4 C) (02/01 0451) Temp Source: Oral (02/01 0451) BP: 115/72 (02/01 0451) Pulse Rate: 68 (02/01 0451)  Labs: Recent Labs    07/30/17 0157 07/30/17 1516 07/31/17 0350 07/31/17 0905 07/31/17 1453 08/01/17 0510  HGB 11.8* 11.0* 10.0*  --   --  10.3*  HCT 40.6 38.7* 33.3*  --   --  34.0*  PLT 103* 72* 64*  --   --  76*  LABPROT 32.3*  --  32.8*  --   --  31.9*  INR 3.17  --  3.24  --   --  3.12  CREATININE 3.73* 4.08* 3.85*  --   --  3.05*  TROPONINI 0.12*  --   --  1.11* 0.90*  --     Estimated Creatinine Clearance: 28.7 mL/min (A) (by C-G formula based on SCr of 3.05 mg/dL (H)).   Assessment: 51 YOM who was transferred to MICU for respiratory distress and required intubation. Warfarin was held d/t elevated INR. Now extubated, no procedures noted. INR this morning 3.12. Hgb 10.3, plts improved at 76. No bleeding noted.  Home dose of warfarin 7.5mg  MWF and 5mg  TTSS.  Goal of Therapy:  Heparin level 0.3-0.7 units/ml Monitor platelets by anticoagulation protocol: Yes   Plan:  Hold warfarin with elevated INR that has been slow to trend down Daily INR Follow s/s bleeding, and any changes to planned interventions that may require holding warfarin   Macauley Mossberg D. Brealynn Contino, PharmD, BCPS Clinical Pharmacist Clinical Phone for 08/01/2017 until 3:30pm: [phone] If after 3:30pm, please call main pharmacy at [phone] 08/01/2017 9:02 AM

## 2017-08-01 NOTE — Progress Notes (Signed)
Pharmacy Antibiotic Note  Maurice Delgado is a 84 y.o. male admitted on 07/29/2017 with sepsis and UTI.  Pharmacy has been consulted for vancomycin and Zosyn dosing.  Patient was on ceftriaxone, but was broadened to vancomycin and Zosyn due to continued fevers. Has now been afebrile (hypothermic at times) in the last 24h. Was extubated yesterday and requiring intubation for respiratory distress. Also had some N/V. Treated for UTI PTA with Augmentin.  No cultures were obtained on admission.  PCCM note yesterday stated to check procalcitonin and if negative, to de-escalate back to ceftriaxone. Procalcitonin was elevated at >150 yesterday, however patient also has CKD which results in higher baseline procalcitonin levels.  Plan: Zosyn 3.375g IV q8h EI Vancomycin 1750mg  IV q48h Follow clinical progression, c/s, renal function, level PRN Follow for ability to de-escalate   Height: 6\' 4"  (193 cm) Weight: (!) 301 lb 9.4 oz (136.8 kg) IBW/kg (Calculated) : 86.8  Temp (24hrs), Avg:98.1 F (36.7 C), Min:97.5 F (36.4 C), Max:98.4 F (36.9 C)  Recent Labs  Lab 07/29/17 1057 07/29/17 1417 07/29/17 1905 07/30/17 0157 07/30/17 1449 07/30/17 1516 07/31/17 0350 08/01/17 0510  WBC 12.7*  --   --  26.0*  --  19.0* 16.2* 17.4*  CREATININE 2.99*  --   --  3.73*  --  4.08* 3.85* 3.05*  LATICACIDVEN  --  2.09* 2.3*  --  1.9  --   --   --     Estimated Creatinine Clearance: 28.7 mL/min (A) (by C-G formula based on SCr of 3.05 mg/dL (H)).    Allergies  Allergen Reactions  . Amiodarone     hyperthyroidism    Ceftriaxone 1/29 x 1 Zosyn 1/30>> Vanc 1/30>>  GI panel: ordered, not done 1/30 BCx: ngtd 1/29 Urine: mult species 1/29 MRSA PCR: negative   Thank you for allowing pharmacy to be a part of this patient's care.  Amilyah Nack D. Jazzelle Zhang, PharmD, BCPS Clinical Pharmacist Clinical Phone for 08/01/2017 until 3:30pm: [phone] If after 3:30pm, please call main pharmacy at [phone] 08/01/2017  10:18 AM

## 2017-08-01 NOTE — Progress Notes (Signed)
PROGRESS NOTE    Maurice Delgado  DGL:[id number] DOB: Feb 10, 1934 DOA: 07/29/2017 PCP: Elwyn Reach, MD   Outpatient Specialists:     Brief Narrative:   Maurice Delgado is a 84 y.o. male with a Past Medical History of afib on Coumadin; OSA on CPAP; HTN; GERD; COPD on home O2; stage IV CKD; and chronic systolic HF (EF 51% in 8/84) who presents with weakness and a fall.  In discussion with the patient and family, he developed diarrhea Sunday after dietary indiscretion (does not eat pork but ate a sausage biscuit that a friend brought over).  He had multiple episodes of diarrhea.  He also had significant dysuria and hesitancy on Saturday that has been ongoing and he was found to have some blood in his underwear today.  His family reports a very sedentary lifestyle - he sleeps all day and stays up much of the night - and no change in his chronic LE edema and venous stasis.  Today, he was in the bathroom with more loose stools when he was unable to stand up because he was so weak.  He fell and struck his head, but it does not sound as though he lost consciousness.  In the ER, there was concern for a CHF exacerbation and so he was given Lasix.  He had <200 cc on a bladder scan prior and did not have UOP following the Lasix.   Assessment & Plan:   Principal Problem:   Syncope Active Problems:   OSA on CPAP   HTN (hypertension)   History of DVT in adulthood   Acute on chronic systolic and diastolic heart failure, NYHA class 1 (HCC)   Nausea vomiting and diarrhea   Pacemaker   Acute on chronic respiratory failure with hypoxemia (HCC)   CKD (chronic kidney disease) stage 4, GFR 15-29 ml/min (HCC)   COPD with hypoxia (HCC)   Paroxysmal atrial fibrillation (HCC)   UTI (recent) w/ persistent back pain and ? hematuria/dysuria   Chronic edema of legs   Respiratory failure ([place])   Acute respiratory failure due to CO2 retention -resolved currently. Pt is on [place] with supplemental oxygen at  3 pt is on home o2 .  AMS - CT scan of head negative - resolving I suspect he is at or nearing baseline.  Syncope -Patient presents to ER via EMS after apparently having a syncopal episode while seated on the toilet; it is unclear as to whether patient had nausea, vomiting and diarrhea before or after syncopal event-patient denies recollection of straining while attempting to pass bowel movement and states primarily went to bathroom to void -Echocardiogram reporting ef of 50-55% with no wall motion abnormality reported. -Cycle troponin- mildly elevated but trending down and no chest pain I suspect was secondary to demand ischemia  Fevers with leukocytosis -suspect due to urinary source but urine culture non specific -blood culture negative -currently on vanc and zosyn will narrow antibiotic regimen and place on levaquin -recently on 5 days of augmentin from Dr. Jimmy Footman started 1/15  AKI on CKD IV -suspect from dehydration -renal U/S shows: Irregular diffuse wall thickening of the bladder consistent with infectious or inflammatory cystitis. No discrete bladder mass  Chronic edema of legs -Patient sits with legs dependent despite having recliner available -SCDs order to assist with mobilization of fluid -Likely not reflective of true volume overload status -TSH normal  OSA -CPAP at night  Paroxysmal atrial fibrillation  -Currently rate controlled and 100% ventricular paced -Pharmacy to manage  warfarin-current INR slightly supratherapeutic at 3.35 -Continue Betapace and carvedilol -Continue digoxin noting current level 0.6 -CHA2DS2-VASc=5   Nausea, vomiting and diarrhea -Apparent solitary occurrence, no new complaints reported today. -For completeness of exam will check gastrointestinal PCR panel to rule out possible viral gastro-enteritis -? Etiology possibly secondary to UTI.    DVT prophylaxis:  Fully anticoagulated   Code Status: Full Code   Family  Communication: None at bedside.  Disposition Plan: pending improvement in condition   Consultants:   Critical care  Subjective: Pt has no new complaints currently.  Objective: Vitals:   08/01/17 0451 08/01/17 0732 08/01/17 0937 08/01/17 1321  BP: 115/72  126/74   Pulse: 68     Resp: 18     Temp: (!) 97.5 F (36.4 C)     TempSrc: Oral     SpO2: 98% 97% 96% 94%  Weight: (!) 136.8 kg (301 lb 9.4 oz)     Height:        Intake/Output Summary (Last 24 hours) at 08/01/2017 1630 Last data filed at 08/01/2017 1527 Gross per 24 hour  Intake 760 ml  Output 2900 ml  Net -2140 ml   Filed Weights   07/30/17 1600 07/31/17 2155 08/01/17 0451  Weight: (!) 136.1 kg (300 lb 0.7 oz) (!) 137.4 kg (302 lb 14.6 oz) (!) 136.8 kg (301 lb 9.4 oz)    Examination:  General exam: Pt in nad, alert and awake, sitting up in chair. Respiratory system: no wheezing, equal chest rise. 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   Data Reviewed: I have personally reviewed following labs and imaging studies  CBC: Recent Labs  Lab 07/29/17 1057 07/30/17 0157 07/30/17 1516 07/31/17 0350 08/01/17 0510  WBC 12.7* 26.0* 19.0* 16.2* 17.4*  HGB 11.9* 11.8* 11.0* 10.0* 10.3*  HCT 39.1 40.6 38.7* 33.3* 34.0*  MCV 90.3 92.9 92.6 88.8 88.3  PLT 92* 103* 72* 64* 76*   Basic Metabolic Panel: Recent Labs  Lab 07/29/17 1057 07/30/17 0157 07/30/17 1516 07/31/17 0350 08/01/17 0510  NA 140 141 141 143 145  K 3.9 5.4* 5.3* 4.2 4.4  CL 99* 98* 102 106 108  CO2 29 29 26 25 27   GLUCOSE 108* 98 113* 105* 101*  BUN 42* 46* 56* 58* 56*  CREATININE 2.99* 3.73* 4.08* 3.85* 3.05*  CALCIUM 9.0 8.7* 7.9* 8.0* 8.6*  MG  --   --   --   --  2.0  PHOS  --   --   --   --  3.5   GFR: Estimated Creatinine Clearance: 28.7 mL/min  (A) (by C-G formula based on SCr of 3.05 mg/dL (H)). Liver Function Tests: Recent Labs  Lab 07/29/17 1057 07/30/17 0157  AST 25 45*  ALT 14* 20  ALKPHOS 76 61  BILITOT 1.0 0.8  PROT 6.5 6.8  ALBUMIN 3.1* 3.0*   Recent Labs  Lab 07/29/17 1057  LIPASE 45   Recent Labs  Lab 07/30/17 1449  AMMONIA 33   Coagulation Profile: Recent Labs  Lab 07/29/17 1057 07/30/17 0157 07/31/17 0350 08/01/17 0510  INR 3.35 3.17 3.24 3.12   Cardiac Enzymes: Recent Labs  Lab 07/29/17 1359 07/29/17 1913 07/30/17 0157 07/31/17 0905 07/31/17 1453  TROPONINI 0.04* 0.04* 0.12* 1.11* 0.90*   BNP (last 3 results) No results for input(s): PROBNP in the last 8760 hours. HbA1C: No results  for input(s): HGBA1C in the last 72 hours. CBG: Recent Labs  Lab 07/31/17 0338 07/31/17 0804 07/31/17 1133 07/31/17 1600 07/31/17 2003  GLUCAP 88 90 99 102* 142*   Lipid Profile: Recent Labs    07/30/17 1603  TRIG 206*   Thyroid Function Tests: No results for input(s): TSH, T4TOTAL, FREET4, T3FREE, THYROIDAB in the last 72 hours. Anemia Panel: No results for input(s): VITAMINB12, FOLATE, FERRITIN, TIBC, IRON, RETICCTPCT in the last 72 hours. Urine analysis:    Component Value Date/Time   COLORURINE YELLOW 07/22/2012 1651   APPEARANCEUR CLEAR 07/22/2012 1651   LABSPEC 1.007 07/22/2012 1651   PHURINE 7.0 07/22/2012 1651   GLUCOSEU NEGATIVE 07/22/2012 1651   HGBUR TRACE (A) 07/22/2012 1651   BILIRUBINUR NEGATIVE 07/22/2012 1651   KETONESUR NEGATIVE 07/22/2012 1651   PROTEINUR NEGATIVE 07/22/2012 1651   UROBILINOGEN 0.2 07/22/2012 1651   NITRITE NEGATIVE 07/22/2012 1651   LEUKOCYTESUR NEGATIVE 07/22/2012 1651    Recent Results (from the past 240 hour(s))  MRSA PCR Screening     Status: None   Collection Time: 07/29/17  5:30 PM  Result Value Ref Range Status   MRSA by PCR NEGATIVE NEGATIVE Final    Comment:        The GeneXpert MRSA Assay (FDA approved for NASAL specimens only), is  one component of a comprehensive MRSA colonization surveillance program. It is not intended to diagnose MRSA infection nor to guide or monitor treatment for MRSA infections.   Culture, blood (Routine X 2) w Reflex to ID Panel     Status: None (Preliminary result)   Collection Time: 07/30/17 10:57 AM  Result Value Ref Range Status   Specimen Description BLOOD RIGHT ANTECUBITAL  Final   Special Requests   Final    BOTTLES DRAWN AEROBIC AND ANAEROBIC Blood Culture adequate volume   Culture   Final    NO GROWTH 2 DAYS Performed at [facility], [address] [place], [place] [address]    Report Status PENDING  Incomplete  Culture, blood (Routine X 2) w Reflex to ID Panel     Status: None (Preliminary result)   Collection Time: 07/30/17 11:00 AM  Result Value Ref Range Status   Specimen Description BLOOD RIGHT ANTECUBITAL  Final   Special Requests   Final    BOTTLES DRAWN AEROBIC AND ANAEROBIC Blood Culture adequate volume   Culture   Final    NO GROWTH 2 DAYS Performed at [facility], [place] [address] [place], [place] [address]    Report Status PENDING  Incomplete  Culture, Urine     Status: Abnormal   Collection Time: 07/30/17  2:46 PM  Result Value Ref Range Status   Specimen Description URINE, RANDOM  Final   Special Requests NONE  Final   Culture MULTIPLE SPECIES PRESENT, SUGGEST RECOLLECTION (A)  Final   Report Status 07/31/2017 FINAL  Final      Anti-infectives (From admission, onward)   Start     Dose/Rate Route Frequency Ordered Stop   07/30/17 1600  vancomycin (VANCOCIN) 1,750 mg in sodium chloride 0.9 % 500 mL IVPB     1,750 mg 250 mL/hr over 120 Minutes Intravenous Every 48 hours 07/30/17 1442     07/30/17 1500  piperacillin-tazobactam (ZOSYN) IVPB 3.375 g     3.375 g 12.5 mL/hr over 240 Minutes Intravenous Every 8 hours 07/30/17 1436     07/29/17 1800  cefTRIAXone (ROCEPHIN) 1 g in dextrose 5 % 50 mL IVPB  Status:  Discontinued     1 g 100 mL/hr  over 30 Minutes Intravenous Every 24 hours 07/29/17 1747 07/30/17 1416       Radiology Studies: Dg Chest Port 1 View  Result Date: 08/01/2017 CLINICAL DATA:  Acute respiratory failure EXAM: PORTABLE CHEST - 1 VIEW COMPARISON:  the previous day's study FINDINGS: Patient has been extubated and the nasogastric tube removed. Left subclavian AICD stable. Mild bibasilar interstitial edema or infiltrates have slightly increased. Suspect mild central pulmonary vascular congestion. Heart size and mediastinal contours are within normal limits. Aortic Atherosclerosis (ICD10-170.0) No definite effusion although the lateral costophrenic angles are partially excluded. No pneumothorax. Visualized bones unremarkable. IMPRESSION: 1. Extubation with slight worsening of bibasilar interstitial edema or infiltrates. Electronically Signed   By: Lucrezia Europe M.D.   On: 08/01/2017 08:14   Dg Chest Port 1 View  Result Date: 07/31/2017 CLINICAL DATA:  Respiratory failure.  History of COPD. EXAM: PORTABLE CHEST 1 VIEW COMPARISON:  07/30/2017. FINDINGS: Lines and tubes appear to be in stable position. Most portion hemidiaphragms and costophrenic angles not imaged. AICD in stable position. Stable cardiomegaly. Diffuse bilateral pulmonary interstitial prominence noted. Mild CHF cannot be excluded. No pleural effusion or pneumothorax. IMPRESSION: 1.  Lines and tubes appear to be in stable position. 2. AICD in stable position. Cardiomegaly with pulmonary venous congestion and mild bilateral interstitial prominence. Mild CHF cannot be excluded on today's exam. Electronically Signed   By: [place]   On: 07/31/2017 09:04        Scheduled Meds: . calcitRIOL  0.5 mcg Per Tube QODAY  . carvedilol  6.25 mg Per Tube BID  . digoxin  125 mcg Per Tube q morning - 10a  . ipratropium-albuterol  3 mL Nebulization TID  . mouth rinse  15 mL Mouth Rinse BID  . sodium chloride flush  3 mL Intravenous Q12H  . sotalol  160 mg Per Tube  Daily  . tamsulosin  0.4 mg Oral q morning - 10a  . triamcinolone 0.1 % cream : eucerin   Topical BID  . Warfarin - Pharmacist Dosing Inpatient   Does not apply q1800   Continuous Infusions: . piperacillin-tazobactam (ZOSYN)  IV 3.375 g (08/01/17 1334)  . vancomycin Stopped (07/30/17 1813)     LOS: 2 days    Time spent: 79 min    Velvet Bathe, DO [facility] Pager [phone]  If 7PM-7AM, please contact night-coverage [email or web address] Password [email or web address] 08/01/2017, 4:30 PM

## 2017-08-01 NOTE — Progress Notes (Signed)
Inpatient Rehabilitation  Per PT request, patient was screened by Alese Furniss for appropriateness for an Inpatient Acute Rehab consult.  At this time we are recommending an Inpatient Rehab consult.  Text paged MD to notify; please order if you are agreeable.    Serenidy Waltz, M.A., CCC/SLP Admission Coordinator  [facility]  Cell [phone]  

## 2017-08-01 NOTE — Progress Notes (Signed)
NURSING PROGRESS NOTE  Kaden Daughdrill [id number] Transfer Data: 08/01/2017 6:18 AM Attending Provider: Rush Farmer, MD YYT:KPTWS, [place] Newcomer, MD Code Status: Full  Vermon Gertsch is a 84 y.o. male patient transferred from 1M -No acute distress noted.  -No complaints of shortness of breath.  -No complaints of chest pain.   Cardiac Monitoring: Box # *22 in place. Cardiac monitor yields:Atrial Pace on monitor  Last Documented Vital Signs: Blood pressure 115/72, pulse 68, temperature (!) 97.5 F (36.4 C), temperature source Oral, resp. rate 18, height 6\' 4"  (1.93 m), weight (!) 136.8 kg (301 lb 9.4 oz), SpO2 98 %.  IV Fluids:  IV in place, occlusive dsg intact without redness, IV cath antecubital right, condition patent and no redness none.   Allergies:  Amiodarone  Past Medical History:   has a past medical history of Atrial tachycardia ([place]), Chronic respiratory failure with hypoxia ([place]), Chronic systolic CHF (congestive heart failure) ([place]), CKD (chronic kidney disease), stage IV (HCC), COPD (chronic obstructive pulmonary disease) ([place]), DVT (deep venous thrombosis) ([place]), GERD (gastroesophageal reflux disease), Gout, Hemoptysis, HTN (hypertension), Hyperthyroidism, Left fibular fracture, Multiple thyroid nodules, Nonischemic cardiomyopathy ([place]), OSA on CPAP, PAF (paroxysmal atrial fibrillation) ([place]), and Retroperitoneal bleed.  Past Surgical History:   has a past surgical history that includes Cardiac defibrillator placement; Video bronchoscopy (02/26/2012); Foot surgery (Right); Tonsillectomy (1940's); Appendectomy (1940's); Vena cava filter placement (2007); supraventricular tachycardia ablation (N/A, 06/25/2013); Biv icd genertaor change out (N/A, 08/30/2014); TEE without cardioversion (N/A, 01/09/2016); and Cardioversion (N/A, 01/09/2016).  Social History:   reports that he has quit smoking. His smoking use included cigarettes. He has a 45.00 pack-year smoking history. he has  never used smokeless tobacco. He reports that he uses drugs. Drug: Marijuana. He reports that he does not drink alcohol.  Skin: patient has a bruise to his right forehead. Otherwise skin is clean dry and intact  Patient/Family orientated to room. Information packet given to patient/family. Admission inpatient armband information verified with patient/family to include name and date of birth and placed on patient arm. Side rails up x 2, fall assessment and education completed with patient/family. Patient/family able to verbalize understanding of risk associated with falls and verbalized understanding to call for assistance before getting out of bed. Call light within reach. Patient/family able to voice and demonstrate understanding of unit orientation instructions.    Will continue to evaluate and treat per MD orders.

## 2017-08-01 NOTE — Consult Note (Signed)
   [facility] CM Inpatient Consult   08/01/2017  Maurice Delgado 29-May-1934 [id number]  Patient is a 84 year old admitted with sepsis and UTI.  Patient is in the [place].   Met with the patient at the bedside.  Patient states he was functioning at home until his hospitalization.  He sates his wife drives, he has oxygen at home prior to admission and feel like he was "really knocked down by this, I feel like I died and coming back slowly."  Pharmacy:  [facility] Primary Care Provider: Dr. Jossie Ng  Patient will have telephonic follow up post hospital stay.  He states he will let his wife look over the brochure, 24 hour nurse line and contact information, if she feels that they need any additional help. Brochure at bedside.   For questions, please contact:  Natividad Brood, RN BSN [facility] Liaison  [phone] business mobile phone Toll free office [phone]

## 2017-08-01 NOTE — Plan of Care (Signed)
Patient remained free of injury and respiratory complications throughout the shift. He did not want to wear his cpap during the night. Patient said he didn't need it. Monitor patient frequently for patient safety.

## 2017-08-02 LAB — GASTROINTESTINAL PANEL BY PCR, STOOL (REPLACES STOOL CULTURE)
ASTROVIRUS: NOT DETECTED
Adenovirus F40/41: NOT DETECTED
CAMPYLOBACTER SPECIES: NOT DETECTED
CYCLOSPORA CAYETANENSIS: NOT DETECTED
Cryptosporidium: NOT DETECTED
ENTAMOEBA HISTOLYTICA: NOT DETECTED
ENTEROAGGREGATIVE E COLI (EAEC): NOT DETECTED
ENTEROPATHOGENIC E COLI (EPEC): NOT DETECTED
ENTEROTOXIGENIC E COLI (ETEC): NOT DETECTED
GIARDIA LAMBLIA: NOT DETECTED
Norovirus GI/GII: NOT DETECTED
Plesimonas shigelloides: NOT DETECTED
Rotavirus A: NOT DETECTED
Salmonella species: NOT DETECTED
Sapovirus (I, II, IV, and V): NOT DETECTED
Shiga like toxin producing E coli (STEC): NOT DETECTED
Shigella/Enteroinvasive E coli (EIEC): NOT DETECTED
VIBRIO CHOLERAE: NOT DETECTED
VIBRIO SPECIES: NOT DETECTED
YERSINIA ENTEROCOLITICA: NOT DETECTED

## 2017-08-02 LAB — PROTIME-INR
INR: 2.86
Prothrombin Time: 29.8 seconds — ABNORMAL HIGH (ref 11.4–15.2)

## 2017-08-02 MED ORDER — WARFARIN SODIUM 5 MG PO TABS
5.0000 mg | ORAL_TABLET | Freq: Once | ORAL | Status: AC
  Administered 2017-08-02: 5 mg via ORAL
  Filled 2017-08-02 (×2): qty 1

## 2017-08-02 NOTE — Progress Notes (Signed)
Pt was stable throughout the day, getting up and sat at the edge of the bed, vitals stable, oxygen continue @2l /min, low appetite, have BM, sent GI panel to lab, Foley catheter continue.will continue to monitor the patient  Palma Holter, RN

## 2017-08-02 NOTE — Progress Notes (Signed)
ANTICOAGULATION CONSULT NOTE - Follow Up Consult  Pharmacy Consult for warfarin Indication: atrial fibrillation  Allergies  Allergen Reactions  . Amiodarone     hyperthyroidism    Patient Measurements: Height: 6\' 4"  (193 cm) Weight: (!) 300 lb 14.9 oz (136.5 kg) IBW/kg (Calculated) : 86.8  Vital Signs: Temp: 98.9 F (37.2 C) (02/02 0514) Temp Source: Oral (02/02 0514) BP: 128/72 (02/02 0514) Pulse Rate: 69 (02/02 0514)  Labs: Recent Labs    07/30/17 1516 07/31/17 0350 07/31/17 0905 07/31/17 1453 08/01/17 0510 08/02/17 0451  HGB 11.0* 10.0*  --   --  10.3*  --   HCT 38.7* 33.3*  --   --  34.0*  --   PLT 72* 64*  --   --  76*  --   LABPROT  --  32.8*  --   --  31.9* 29.8*  INR  --  3.24  --   --  3.12 2.86  CREATININE 4.08* 3.85*  --   --  3.05*  --   TROPONINI  --   --  1.11* 0.90*  --   --     Estimated Creatinine Clearance: 28.2 mL/min (A) (by C-G formula based on SCr of 3.05 mg/dL (H)).   Assessment: 68 YOM who was transferred to MICU for respiratory distress and required intubation. Warfarin was held d/t elevated INR. Now extubated, no procedures noted. No warfarin given since 1/29 and INR back within range today at 2.86.  Home dose of warfarin 7.5mg  MWF and 5mg  TTSS.  Goal of Therapy:  INR 2-3 Monitor platelets by anticoagulation protocol: Yes   Plan:  1) Resume warfarin 5mg  tonight 2) Daily INR  Nena Jordan, PharmD, BCPS 08/02/2017 11:03 AM

## 2017-08-02 NOTE — Progress Notes (Signed)
PROGRESS NOTE    Maurice Delgado  HQI:[id number] DOB: 13-Mar-1934 DOA: 07/29/2017 PCP: Elwyn Reach, MD   Brief Narrative:   Maurice Delgado is a 84 y.o. male with a Past Medical History of afib on Coumadin; OSA on CPAP; HTN; GERD; COPD on home O2; stage IV CKD; and chronic systolic HF (EF 13% in 2/44) who presents with weakness and a fall.  In discussion with the patient and family, he developed diarrhea Sunday after dietary indiscretion (does not eat pork but ate a sausage biscuit that a friend brought over).  He had multiple episodes of diarrhea.  He also had significant dysuria and hesitancy on Saturday that has been ongoing and he was found to have some blood in his underwear today.  His family reports a very sedentary lifestyle - he sleeps all day and stays up much of the night - and no change in his chronic LE edema and venous stasis.  Today, he was in the bathroom with more loose stools when he was unable to stand up because he was so weak.  He fell and struck his head, but it does not sound as though he lost consciousness.  In the ER, there was concern for a CHF exacerbation and so he was given Lasix.  He had < 200 cc on a bladder scan prior and did not have UOP following the Lasix.  Assessment & Plan:   Principal Problem:   Syncope Active Problems:   OSA on CPAP   HTN (hypertension)   History of DVT in adulthood   Acute on chronic systolic and diastolic heart failure, NYHA class 1 (HCC)   Nausea vomiting and diarrhea   Pacemaker   Acute on chronic respiratory failure with hypoxemia (HCC)   CKD (chronic kidney disease) stage 4, GFR 15-29 ml/min (HCC)   COPD with hypoxia (HCC)   Paroxysmal atrial fibrillation (HCC)   UTI (recent) w/ persistent back pain and ? hematuria/dysuria   Chronic edema of legs   Respiratory failure (HCC)   Acute respiratory failure due to CO2 retention - Pt breathing comfortably on [place]. Resolved.  Pt is on  with supplemental oxygen at 3 pt is  on home o2 .  AMS - CT scan of head negative - continues to resolved  Syncope -Patient presents to ER via EMS after apparently having a syncopal episode while seated on the toilet; it is unclear as to whether patient had nausea, vomiting and diarrhea before or after syncopal event-patient denies recollection of straining while attempting to pass bowel movement and states primarily went to bathroom to void -Echocardiogram reporting ef of 50-55% with no wall motion abnormality reported. -Cycle troponin- mildly elevated but trending down and no chest pain I suspect was secondary to demand ischemia - suspecting vaso vagal based on above information  Fevers with leukocytosis -suspect due to urinary source but urine culture non specific -blood culture negative -currently on vanc and zosyn will narrow antibiotic regimen and place on Rocephin -recently on 5 days of augmentin from Dr. Jimmy Footman started 1/15  AKI on CKD IV -suspect from dehydration -renal U/S shows: Irregular diffuse wall thickening of the bladder consistent with infectious or inflammatory cystitis. No discrete bladder mass  Chronic edema of legs -Patient sits with legs dependent despite having recliner available -SCDs order to assist with mobilization of fluid -Likely not reflective of true volume overload status -TSH normal  OSA -CPAP at night  Paroxysmal atrial fibrillation  -Currently rate controlled and 100% ventricular paced -Pharmacy  to manage warfarin-current INR slightly supratherapeutic at 3.35 -Continue Betapace and carvedilol -Continue digoxin noting current level 0.6 -CHA2DS2-VASc=5   Nausea, vomiting and diarrhea -Apparent solitary occurrence, no new complaints reported today. -For completeness of exam will check gastrointestinal PCR panel to rule out possible viral gastro-enteritis -? Etiology possibly secondary to UTI.    DVT prophylaxis:  Fully anticoagulated   Code Status: Full  Code   Family Communication: None at bedside.  Disposition Plan: pending improvement in condition   Consultants:   Critical care  Subjective: Pt has no new complaints, no acute issues overnight.  Objective: Vitals:   08/01/17 2105 08/01/17 2223 08/02/17 0514 08/02/17 1241  BP:  138/90 128/72 130/74  Pulse:  70 69 67  Resp:  18 20 20   Temp:  99.2 F (37.3 C) 98.9 F (37.2 C) 97.7 F (36.5 C)  TempSrc:  Oral Oral Oral  SpO2: 96% 93% 93% 97%  Weight:   (!) 136.5 kg (300 lb 14.9 oz)   Height:        Intake/Output Summary (Last 24 hours) at 08/02/2017 1348 Last data filed at 08/02/2017 1013 Gross per 24 hour  Intake 1400 ml  Output 2450 ml  Net -1050 ml   Filed Weights   07/31/17 2155 08/01/17 0451 08/02/17 0514  Weight: (!) 137.4 kg (302 lb 14.6 oz) (!) 136.8 kg (301 lb 9.4 oz) (!) 136.5 kg (300 lb 14.9 oz)    Examination: Exam unchanged when compared to 08/01/2017  General exam: Pt in nad, alert and awake, sitting up in chair. Respiratory system: no wheezing, equal chest rise.  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   Data Reviewed: I have personally reviewed following labs and imaging studies  CBC: Recent Labs  Lab 07/29/17 1057 07/30/17 0157 07/30/17 1516 07/31/17 0350 08/01/17 0510  WBC 12.7* 26.0* 19.0* 16.2* 17.4*  HGB 11.9* 11.8* 11.0* 10.0* 10.3*  HCT 39.1 40.6 38.7* 33.3* 34.0*  MCV 90.3 92.9 92.6 88.8 88.3  PLT 92* 103* 72* 64* 76*   Basic Metabolic Panel: Recent Labs  Lab 07/29/17 1057 07/30/17 0157 07/30/17 1516 07/31/17 0350 08/01/17 0510  NA 140 141 141 143 145  K 3.9 5.4* 5.3* 4.2 4.4  CL 99* 98* 102 106 108  CO2 29 29 26 25 27   GLUCOSE 108* 98 113* 105* 101*  BUN 42* 46* 56* 58* 56*  CREATININE 2.99* 3.73* 4.08* 3.85* 3.05*  CALCIUM 9.0  8.7* 7.9* 8.0* 8.6*  MG  --   --   --   --  2.0  PHOS  --   --   --   --  3.5   GFR: Estimated Creatinine Clearance: 28.2 mL/min (A) (by C-G formula based on SCr of 3.05 mg/dL (H)). Liver Function Tests: Recent Labs  Lab 07/29/17 1057 07/30/17 0157  AST 25 45*  ALT 14* 20  ALKPHOS 76 61  BILITOT 1.0 0.8  PROT 6.5 6.8  ALBUMIN 3.1* 3.0*   Recent Labs  Lab 07/29/17 1057  LIPASE 45   Recent Labs  Lab 07/30/17 1449  AMMONIA 33   Coagulation Profile: Recent Labs  Lab 07/29/17 1057 07/30/17 0157 07/31/17 0350 08/01/17 0510 08/02/17 0451  INR 3.35 3.17 3.24 3.12 2.86   Cardiac Enzymes: Recent Labs  Lab 07/29/17 1359 07/29/17 1913 07/30/17 0157 07/31/17 0905 07/31/17 1453  TROPONINI 0.04* 0.04* 0.12* 1.11*  0.90*   BNP (last 3 results) No results for input(s): PROBNP in the last 8760 hours. HbA1C: No results for input(s): HGBA1C in the last 72 hours. CBG: Recent Labs  Lab 07/31/17 0338 07/31/17 0804 07/31/17 1133 07/31/17 1600 07/31/17 2003  GLUCAP 88 90 99 102* 142*   Lipid Profile: Recent Labs    07/30/17 1603  TRIG 206*   Thyroid Function Tests: No results for input(s): TSH, T4TOTAL, FREET4, T3FREE, THYROIDAB in the last 72 hours. Anemia Panel: No results for input(s): VITAMINB12, FOLATE, FERRITIN, TIBC, IRON, RETICCTPCT in the last 72 hours. Urine analysis:    Component Value Date/Time   COLORURINE YELLOW 07/22/2012 1651   APPEARANCEUR CLEAR 07/22/2012 1651   LABSPEC 1.007 07/22/2012 1651   PHURINE 7.0 07/22/2012 1651   GLUCOSEU NEGATIVE 07/22/2012 1651   HGBUR TRACE (A) 07/22/2012 1651   BILIRUBINUR NEGATIVE 07/22/2012 1651   KETONESUR NEGATIVE 07/22/2012 1651   PROTEINUR NEGATIVE 07/22/2012 1651   UROBILINOGEN 0.2 07/22/2012 1651   NITRITE NEGATIVE 07/22/2012 1651   LEUKOCYTESUR NEGATIVE 07/22/2012 1651    Recent Results (from the past 240 hour(s))  MRSA PCR Screening     Status: None   Collection Time: 07/29/17  5:30 PM  Result  Value Ref Range Status   MRSA by PCR NEGATIVE NEGATIVE Final    Comment:        The GeneXpert MRSA Assay (FDA approved for NASAL specimens only), is one component of a comprehensive MRSA colonization surveillance program. It is not intended to diagnose MRSA infection nor to guide or monitor treatment for MRSA infections.   Culture, blood (Routine X 2) w Reflex to ID Panel     Status: None (Preliminary result)   Collection Time: 07/30/17 10:57 AM  Result Value Ref Range Status   Specimen Description BLOOD RIGHT ANTECUBITAL  Final   Special Requests   Final    BOTTLES DRAWN AEROBIC AND ANAEROBIC Blood Culture adequate volume   Culture   Final    NO GROWTH 3 DAYS Performed at [facility], [address] [place], [place] [address]    Report Status PENDING  Incomplete  Culture, blood (Routine X 2) w Reflex to ID Panel     Status: None (Preliminary result)   Collection Time: 07/30/17 11:00 AM  Result Value Ref Range Status   Specimen Description BLOOD RIGHT ANTECUBITAL  Final   Special Requests   Final    BOTTLES DRAWN AEROBIC AND ANAEROBIC Blood Culture adequate volume   Culture   Final    NO GROWTH 3 DAYS Performed at [facility], [address] [place], [place] [address]    Report Status PENDING  Incomplete  Culture, Urine     Status: Abnormal   Collection Time: 07/30/17  2:46 PM  Result Value Ref Range Status   Specimen Description URINE, RANDOM  Final   Special Requests NONE  Final   Culture MULTIPLE SPECIES PRESENT, SUGGEST RECOLLECTION (A)  Final   Report Status 07/31/2017 FINAL  Final      Anti-infectives (From admission, onward)   Start     Dose/Rate Route Frequency Ordered Stop   08/01/17 2000  cefTRIAXone (ROCEPHIN) 1 g in dextrose 5 % 50 mL IVPB     1 g 100 mL/hr over 30 Minutes Intravenous Every 24 hours 08/01/17 1656     07/30/17 1600  vancomycin (VANCOCIN) 1,750 mg in sodium chloride 0.9 % 500 mL IVPB  Status:  Discontinued     1,750  mg 250  mL/hr over 120 Minutes Intravenous Every 48 hours 07/30/17 1442 08/01/17 1638   07/30/17 1500  piperacillin-tazobactam (ZOSYN) IVPB 3.375 g  Status:  Discontinued     3.375 g 12.5 mL/hr over 240 Minutes Intravenous Every 8 hours 07/30/17 1436 08/01/17 1638   07/29/17 1800  cefTRIAXone (ROCEPHIN) 1 g in dextrose 5 % 50 mL IVPB  Status:  Discontinued     1 g 100 mL/hr over 30 Minutes Intravenous Every 24 hours 07/29/17 1747 07/30/17 1416       Radiology Studies: Dg Chest Port 1 View  Result Date: 08/01/2017 CLINICAL DATA:  Acute respiratory failure EXAM: PORTABLE CHEST - 1 VIEW COMPARISON:  the previous day's study FINDINGS: Patient has been extubated and the nasogastric tube removed. Left subclavian AICD stable. Mild bibasilar interstitial edema or infiltrates have slightly increased. Suspect mild central pulmonary vascular congestion. Heart size and mediastinal contours are within normal limits. Aortic Atherosclerosis (ICD10-170.0) No definite effusion although the lateral costophrenic angles are partially excluded. No pneumothorax. Visualized bones unremarkable. IMPRESSION: 1. Extubation with slight worsening of bibasilar interstitial edema or infiltrates. Electronically Signed   By: Lucrezia Europe M.D.   On: 08/01/2017 08:14        Scheduled Meds: . calcitRIOL  0.5 mcg Per Tube QODAY  . carvedilol  6.25 mg Per Tube BID  . digoxin  125 mcg Per Tube q morning - 10a  . ipratropium-albuterol  3 mL Nebulization TID  . mouth rinse  15 mL Mouth Rinse BID  . sodium chloride flush  3 mL Intravenous Q12H  . sotalol  160 mg Per Tube Daily  . tamsulosin  0.4 mg Oral q morning - 10a  . triamcinolone 0.1 % cream : eucerin   Topical BID  . warfarin  5 mg Oral ONCE-1800  . Warfarin - Pharmacist Dosing Inpatient   Does not apply q1800   Continuous Infusions: . cefTRIAXone (ROCEPHIN)  IV Stopped (08/01/17 2151)     LOS: 3 days    Time spent: 35 min  Velvet Bathe, DO [facility] Pager  [phone]  If 7PM-7AM, please contact night-coverage [email or web address] Password [email or web address] 08/02/2017, 1:48 PM

## 2017-08-02 NOTE — Plan of Care (Signed)
  Coping: Level of anxiety will decrease 08/02/2017 0537 - Completed/Met by Evert Kohl, RN   Nutrition: Adequate nutrition will be maintained 08/02/2017 0537 - Completed/Met by Evert Kohl, RN   Pain Managment: General experience of comfort will improve 08/02/2017 0533 - Completed/Met by Evert Kohl, RN   Safety: Ability to remain free from injury will improve 08/02/2017 0533 - Completed/Met by Evert Kohl, RN   Skin Integrity: Risk for impaired skin integrity will decrease 08/02/2017 0533 - Completed/Met by Evert Kohl, RN

## 2017-08-02 NOTE — Plan of Care (Signed)
  Pain Managment: General experience of comfort will improve 08/02/2017 0533 - Completed/Met by Evert Kohl, RN   Safety: Ability to remain free from injury will improve 08/02/2017 0533 - Completed/Met by Evert Kohl, RN   Skin Integrity: Risk for impaired skin integrity will decrease 08/02/2017 0533 - Completed/Met by Evert Kohl, RN

## 2017-08-03 DIAGNOSIS — N390 Urinary tract infection, site not specified: Secondary | ICD-10-CM

## 2017-08-03 LAB — CBC WITH DIFFERENTIAL/PLATELET
BASOS ABS: 0 10*3/uL (ref 0.0–0.1)
Basophils Relative: 0 %
EOS ABS: 0.1 10*3/uL (ref 0.0–0.7)
EOS PCT: 2 %
HCT: 35.7 % — ABNORMAL LOW (ref 39.0–52.0)
Hemoglobin: 10.5 g/dL — ABNORMAL LOW (ref 13.0–17.0)
Lymphocytes Relative: 11 %
Lymphs Abs: 0.9 10*3/uL (ref 0.7–4.0)
MCH: 26.9 pg (ref 26.0–34.0)
MCHC: 29.4 g/dL — ABNORMAL LOW (ref 30.0–36.0)
MCV: 91.3 fL (ref 78.0–100.0)
Monocytes Absolute: 0.8 10*3/uL (ref 0.1–1.0)
Monocytes Relative: 9 %
Neutro Abs: 6.9 10*3/uL (ref 1.7–7.7)
Neutrophils Relative %: 78 %
PLATELETS: 89 10*3/uL — AB (ref 150–400)
RBC: 3.91 MIL/uL — AB (ref 4.22–5.81)
RDW: 15.8 % — ABNORMAL HIGH (ref 11.5–15.5)
WBC: 8.7 10*3/uL (ref 4.0–10.5)

## 2017-08-03 LAB — PROTIME-INR
INR: 2.94
PROTHROMBIN TIME: 30.4 s — AB (ref 11.4–15.2)

## 2017-08-03 MED ORDER — WARFARIN SODIUM 2.5 MG PO TABS
2.5000 mg | ORAL_TABLET | Freq: Once | ORAL | Status: AC
  Administered 2017-08-03: 2.5 mg via ORAL
  Filled 2017-08-03: qty 1

## 2017-08-03 NOTE — Progress Notes (Signed)
PROGRESS NOTE    Maurice Delgado  [name]:[id number] DOB: [birth year] DOA: 07/29/2017 PCP: Maurice Reach, MD   Brief Narrative:   Maurice Delgado is a 84 y.o. male with a Past Medical History of afib on Coumadin; OSA on CPAP; HTN; GERD; COPD on home O2; stage IV CKD; and chronic systolic HF (EF 25% in 8/52) who presents with weakness and a fall.  In discussion with the patient and family, he developed diarrhea Sunday after dietary indiscretion (does not eat pork but ate a sausage biscuit that a friend brought over).  He had multiple episodes of diarrhea.  He also had significant dysuria and hesitancy on Saturday that has been ongoing and he was found to have some blood in his underwear today.  His family reports a very sedentary lifestyle - he sleeps all day and stays up much of the night - and no change in his chronic LE edema and venous stasis.  Today, he was in the bathroom with more loose stools when he was unable to stand up because he was so weak.  He fell and struck his head, but it does not sound as though he lost consciousness.  In the ER, there was concern for a CHF exacerbation and so he was given Lasix.  He had < 200 cc on a bladder scan prior and did not have UOP following the Lasix.  Assessment & Plan:   Principal Problem:   Syncope Active Problems:   OSA on CPAP   HTN (hypertension)   History of DVT in adulthood   Acute on chronic systolic and diastolic heart failure, NYHA class 1 (HCC)   Nausea vomiting and diarrhea   Pacemaker   Acute on chronic respiratory failure with hypoxemia (HCC)   CKD (chronic kidney disease) stage 4, GFR 15-29 ml/min (HCC)   COPD with hypoxia (HCC)   Paroxysmal atrial fibrillation (HCC)   UTI (recent) w/ persistent back pain and ? hematuria/dysuria   Chronic edema of legs   Respiratory failure (HCC)   Acute respiratory failure due to CO2 retention - Resolved.   - Pt is on [place] with supplemental oxygen at 3 pt is on home o2 .  AMS - CT  scan of head negative - suspect patient is at baseline  Syncope - Patient presents to ER via EMS after apparently having a syncopal episode while seated on the toilet; it is unclear as to whether patient had nausea, vomiting and diarrhea before or after syncopal event-patient denies recollection of straining while attempting to pass bowel movement and states primarily went to bathroom to void - Echocardiogram reporting ef of 50-55% with no wall motion abnormality reported. - Cycle troponin- mildly elevated but trending down and no chest pain I suspect was secondary to demand ischemia - suspecting vaso vagal based on above information  Fevers with leukocytosis - suspect due to urinary source but urine culture non specific - blood culture negative - currently on vanc and zosyn will narrow antibiotic regimen and place on Rocephin - recently on 5 days of augmentin from Dr. Jimmy Footman started 1/15  AKI on CKD IV -suspect from dehydration -renal U/S shows: Irregular diffuse wall thickening of the bladder consistent with infectious or inflammatory cystitis. No discrete bladder mass  Chronic edema of legs -Patient sits with legs dependent despite having recliner available -SCDs order to assist with mobilization of fluid -Likely not reflective of true volume overload status -TSH normal  OSA -CPAP at night  Paroxysmal atrial fibrillation  -Currently rate  controlled and 100% ventricular paced -Pharmacy to manage warfarin-current INR slightly supratherapeutic at 3.35 -Continue Betapace and carvedilol -Continue digoxin noting current level 0.6 -CHA2DS2-VASc=5   Nausea, vomiting and diarrhea - Apparent solitary occurrence, no new complaints reported today. - For completeness of exam will check gastrointestinal PCR panel to rule out possible viral gastro-enteritis - Most likely secondary to UTI.    DVT prophylaxis:  Fully anticoagulated   Code Status: Full Code   Family  Communication: None at bedside.  Disposition Plan: pending improvement in condition   Consultants:   Critical care  Subjective: Pt has no new complaints  Objective: Vitals:   08/02/17 2017 08/03/17 0524 08/03/17 0722 08/03/17 0850  BP: 115/68 126/65  (!) 110/56  Pulse: 67 70  70  Resp: 18 18  18   Temp: 98.2 F (36.8 C) 98 F (36.7 C)  98 F (36.7 C)  TempSrc: Oral Oral  Oral  SpO2: 93% 98% 96% 98%  Weight:  (!) 136.3 kg (300 lb 7.8 oz)    Height:        Intake/Output Summary (Last 24 hours) at 08/03/2017 1322 Last data filed at 08/03/2017 0953 Gross per 24 hour  Intake 1134.8 ml  Output 1500 ml  Net -365.2 ml   Filed Weights   08/01/17 0451 08/02/17 0514 08/03/17 0524  Weight: (!) 136.8 kg (301 lb 9.4 oz) (!) 136.5 kg (300 lb 14.9 oz) (!) 136.3 kg (300 lb 7.8 oz)    Examination: Exam unchanged when compared to 08/02/2017  General exam: Pt in nad, alert and awake, sitting up in chair. Respiratory system: no wheezing, equal chest rise.  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   Data Reviewed: I have personally reviewed following labs and imaging studies  CBC: Recent Labs  Lab 07/30/17 0157 07/30/17 1516 07/31/17 0350 08/01/17 0510 08/03/17 1030  WBC 26.0* 19.0* 16.2* 17.4* 8.7  NEUTROABS  --   --   --   --  6.9  HGB 11.8* 11.0* 10.0* 10.3* 10.5*  HCT 40.6 38.7* 33.3* 34.0* 35.7*  MCV 92.9 92.6 88.8 88.3 91.3  PLT 103* 72* 64* 76* 89*   Basic Metabolic Panel: Recent Labs  Lab 07/29/17 1057 07/30/17 0157 07/30/17 1516 07/31/17 0350 08/01/17 0510  NA 140 141 141 143 145  K 3.9 5.4* 5.3* 4.2 4.4  CL 99* 98* 102 106 108  CO2 29 29 26 25 27   GLUCOSE 108* 98 113* 105* 101*  BUN 42* 46* 56* 58* 56*  CREATININE 2.99* 3.73* 4.08* 3.85* 3.05*  CALCIUM 9.0 8.7* 7.9*  8.0* 8.6*  MG  --   --   --   --  2.0  PHOS  --   --   --   --  3.5   GFR: Estimated Creatinine Clearance: 28.2 mL/min (A) (by C-G formula based on SCr of 3.05 mg/dL (H)). Liver Function Tests: Recent Labs  Lab 07/29/17 1057 07/30/17 0157  AST 25 45*  ALT 14* 20  ALKPHOS 76 61  BILITOT 1.0 0.8  PROT 6.5 6.8  ALBUMIN 3.1* 3.0*   Recent Labs  Lab 07/29/17 1057  LIPASE 45   Recent Labs  Lab 07/30/17 1449  AMMONIA 33   Coagulation Profile: Recent Labs  Lab 07/30/17 0157 07/31/17 0350 08/01/17 0510 08/02/17 0451 08/03/17 0749  INR 3.17 3.24 3.12 2.86 2.94   Cardiac Enzymes:  Recent Labs  Lab 07/29/17 1359 07/29/17 1913 07/30/17 0157 07/31/17 0905 07/31/17 1453  TROPONINI 0.04* 0.04* 0.12* 1.11* 0.90*   BNP (last 3 results) No results for input(s): PROBNP in the last 8760 hours. HbA1C: No results for input(s): HGBA1C in the last 72 hours. CBG: Recent Labs  Lab 07/31/17 0338 07/31/17 0804 07/31/17 1133 07/31/17 1600 07/31/17 2003  GLUCAP 88 90 99 102* 142*   Lipid Profile: No results for input(s): CHOL, HDL, LDLCALC, TRIG, CHOLHDL, LDLDIRECT in the last 72 hours. Thyroid Function Tests: No results for input(s): TSH, T4TOTAL, FREET4, T3FREE, THYROIDAB in the last 72 hours. Anemia Panel: No results for input(s): VITAMINB12, FOLATE, FERRITIN, TIBC, IRON, RETICCTPCT in the last 72 hours. Urine analysis:    Component Value Date/Time   COLORURINE YELLOW 07/22/2012 1651   APPEARANCEUR CLEAR 07/22/2012 1651   LABSPEC 1.007 07/22/2012 1651   PHURINE 7.0 07/22/2012 1651   GLUCOSEU NEGATIVE 07/22/2012 1651   HGBUR TRACE (A) 07/22/2012 1651   BILIRUBINUR NEGATIVE 07/22/2012 1651   KETONESUR NEGATIVE 07/22/2012 1651   PROTEINUR NEGATIVE 07/22/2012 1651   UROBILINOGEN 0.2 07/22/2012 1651   NITRITE NEGATIVE 07/22/2012 1651   LEUKOCYTESUR NEGATIVE 07/22/2012 1651    Recent Results (from the past 240 hour(s))  MRSA PCR Screening     Status: None    Collection Time: 07/29/17  5:30 PM  Result Value Ref Range Status   MRSA by PCR NEGATIVE NEGATIVE Final    Comment:        The GeneXpert MRSA Assay (FDA approved for NASAL specimens only), is one component of a comprehensive MRSA colonization surveillance program. It is not intended to diagnose MRSA infection nor to guide or monitor treatment for MRSA infections.   Culture, blood (Routine X 2) w Reflex to ID Panel     Status: None (Preliminary result)   Collection Time: 07/30/17 10:57 AM  Result Value Ref Range Status   Specimen Description BLOOD RIGHT ANTECUBITAL  Final   Special Requests   Final    BOTTLES DRAWN AEROBIC AND ANAEROBIC Blood Culture adequate volume   Culture   Final    NO GROWTH 3 DAYS Performed at [facility], [address] [place], [place] [address]    Report Status PENDING  Incomplete  Culture, blood (Routine X 2) w Reflex to ID Panel     Status: None (Preliminary result)   Collection Time: 07/30/17 11:00 AM  Result Value Ref Range Status   Specimen Description BLOOD RIGHT ANTECUBITAL  Final   Special Requests   Final    BOTTLES DRAWN AEROBIC AND ANAEROBIC Blood Culture adequate volume   Culture   Final    NO GROWTH 3 DAYS Performed at [facility], [place] [address], [place],  [address]    Report Status PENDING  Incomplete  Culture, Urine     Status: Abnormal   Collection Time: 07/30/17  2:46 PM  Result Value Ref Range Status   Specimen Description URINE, RANDOM  Final   Special Requests NONE  Final   Culture MULTIPLE SPECIES PRESENT, SUGGEST RECOLLECTION (A)  Final   Report Status 07/31/2017 FINAL  Final  Gastrointestinal Panel by PCR , Stool     Status: None   Collection Time: 08/02/17 12:53 PM  Result Value Ref Range Status   Campylobacter species NOT DETECTED NOT DETECTED Final   Plesimonas shigelloides NOT DETECTED NOT DETECTED Final   Salmonella species NOT DETECTED NOT DETECTED Final   Yersinia enterocolitica NOT DETECTED  NOT DETECTED Final   Vibrio species NOT DETECTED NOT DETECTED Final   Vibrio cholerae NOT DETECTED NOT DETECTED Final   Enteroaggregative E coli (EAEC) NOT DETECTED NOT DETECTED Final   Enteropathogenic E coli (EPEC) NOT DETECTED NOT DETECTED Final   Enterotoxigenic E coli (ETEC) NOT DETECTED NOT DETECTED Final   Shiga like toxin producing E coli (STEC) NOT DETECTED NOT DETECTED Final   Shigella/Enteroinvasive E coli (EIEC) NOT DETECTED NOT DETECTED Final   Cryptosporidium NOT DETECTED NOT DETECTED Final   Cyclospora cayetanensis NOT DETECTED NOT DETECTED Final   Entamoeba histolytica NOT DETECTED NOT DETECTED Final   Giardia lamblia NOT DETECTED NOT DETECTED Final   Adenovirus F40/41 NOT DETECTED NOT DETECTED Final   Astrovirus NOT DETECTED NOT DETECTED Final   Norovirus GI/GII NOT DETECTED NOT DETECTED Final   Rotavirus A NOT DETECTED NOT DETECTED Final   Sapovirus (I, II, IV, and V) NOT DETECTED NOT DETECTED Final    Comment: Performed at [facility], [address] [place], [place] [address]      Anti-infectives (From admission, onward)   Start     Dose/Rate Route Frequency Ordered Stop   08/01/17 2000  cefTRIAXone (ROCEPHIN) 1 g in dextrose 5 % 50 mL IVPB     1 g 100 mL/hr over 30 Minutes Intravenous Every 24 hours 08/01/17 1656     07/30/17 1600  vancomycin (VANCOCIN) 1,750 mg in sodium chloride 0.9 % 500 mL IVPB  Status:  Discontinued     1,750 mg 250 mL/hr over 120 Minutes Intravenous Every 48 hours 07/30/17 1442 08/01/17 1638   07/30/17 1500  piperacillin-tazobactam (ZOSYN) IVPB 3.375 g  Status:  Discontinued     3.375 g 12.5 mL/hr over 240 Minutes Intravenous Every 8 hours 07/30/17 1436 08/01/17 1638   07/29/17 1800  cefTRIAXone (ROCEPHIN) 1 g in dextrose 5 % 50 mL IVPB  Status:  Discontinued     1 g 100 mL/hr over 30 Minutes Intravenous Every 24 hours 07/29/17 1747 07/30/17 1416       Radiology Studies: No results found.      Scheduled Meds: .  calcitRIOL  0.5 mcg Per Tube QODAY  . carvedilol  6.25 mg Per Tube BID  . digoxin  125 mcg Per Tube q morning - 10a  . ipratropium-albuterol  3 mL Nebulization TID  . mouth rinse  15 mL Mouth Rinse BID  . sodium chloride flush  3 mL Intravenous Q12H  . sotalol  160 mg Per Tube Daily  . tamsulosin  0.4 mg Oral q morning - 10a  . triamcinolone 0.1 % cream : eucerin   Topical BID  . warfarin  2.5 mg Oral ONCE-1800  . Warfarin - Pharmacist Dosing Inpatient   Does not apply q1800   Continuous Infusions: . cefTRIAXone (ROCEPHIN)  IV Stopped (08/02/17 2112)     LOS: 4 days    Time spent: 35 min  Velvet Bathe, DO [facility] Pager [phone]  If 7PM-7AM, please contact night-coverage [email or web address] Password [email or web address] 08/03/2017, 1:22 PM

## 2017-08-03 NOTE — Progress Notes (Signed)
Pt was stable throughout the day, getting up and sat at the edge of the bed, vitals stable, oxygen continue @2l /min, low appetite, have BM (loose stool), GI panel negative so far,  Foley catheter continue.will continue to monitor the patient  Palma Holter, RN

## 2017-08-03 NOTE — Progress Notes (Signed)
ANTICOAGULATION CONSULT NOTE - Follow Up Consult  Pharmacy Consult for warfarin Indication: atrial fibrillation  Allergies  Allergen Reactions  . Amiodarone     hyperthyroidism    Patient Measurements: Height: 6\' 4"  (193 cm) Weight: (!) 300 lb 7.8 oz (136.3 kg) IBW/kg (Calculated) : 86.8  Vital Signs: Temp: 98 F (36.7 C) (02/03 0850) Temp Source: Oral (02/03 0850) BP: 110/56 (02/03 0850) Pulse Rate: 70 (02/03 0850)  Labs: Recent Labs    07/31/17 1453 08/01/17 0510 08/02/17 0451 08/03/17 0749 08/03/17 1030  HGB  --  10.3*  --   --  10.5*  HCT  --  34.0*  --   --  35.7*  PLT  --  76*  --   --  89*  LABPROT  --  31.9* 29.8* 30.4*  --   INR  --  3.12 2.86 2.94  --   CREATININE  --  3.05*  --   --   --   TROPONINI 0.90*  --   --   --   --     Estimated Creatinine Clearance: 28.2 mL/min (A) (by C-G formula based on SCr of 3.05 mg/dL (H)).   Assessment: 27 YOM who was transferred to MICU for respiratory distress and required intubation. Warfarin was held d/t elevated INR (last dose 1/29). Now extubated, no procedures noted. INR back within range yesterday and warfarin resumed. INR therapeutic at 2.94 today and trending up. CBC stable except platelets low at 89 (92 on admit). No bleeding.   Home dose of warfarin 7.5mg  MWF and 5mg  TTSS.  Goal of Therapy:  INR 2-3 Monitor platelets by anticoagulation protocol: Yes   Plan:  1) Warfarin 2.5mg  tonight 2) Daily INR  Nena Jordan, PharmD, BCPS 08/03/2017 11:39 AM

## 2017-08-03 NOTE — Plan of Care (Signed)
  Clinical Measurements: Diagnostic test results will improve 08/03/2017 0125 - Completed/Met by Evert Kohl, RN   Clinical Measurements: Respiratory complications will improve 08/03/2017 0125 - Completed/Met by Evert Kohl, RN

## 2017-08-04 LAB — CULTURE, BLOOD (ROUTINE X 2)
CULTURE: NO GROWTH
Culture: NO GROWTH
Special Requests: ADEQUATE
Special Requests: ADEQUATE

## 2017-08-04 LAB — PROTIME-INR
INR: 2.93
PROTHROMBIN TIME: 30.3 s — AB (ref 11.4–15.2)

## 2017-08-04 MED ORDER — WARFARIN SODIUM 2 MG PO TABS: 2.0000 mg | ORAL_TABLET | Freq: Once | ORAL | Status: DC

## 2017-08-04 NOTE — Progress Notes (Signed)
Physical Therapy Treatment Patient Details Name: Maurice Delgado MRN: [id number] DOB: Mar 30, 1934 Today's Date: 08/04/2017    History of Present Illness Pt adm with weakness, fall, and ?syncope. Pt with acute hypoxic respiratory failure and intubated 1/29. Extubated 1/31. PMH - chf, AICD, gout, ckd, DVT, home O2 use    PT Comments    Patient received in bed, pleasant and willing to participate with skilled PT services this morning. He was able to complete functional bed mobility with extended time and min guard of +2 for safety, however requires Min-ModA x2 for functional transfers depending on surface height he is transferring from. Able to gait train approximately 79f with RW, cues for technique and safety provided, to chair this morning. He was left up in the chair with all needs met, chair alarm activated this morning.    Follow Up Recommendations  CIR     Equipment Recommendations  None recommended by PT    Recommendations for Other Services       Precautions / Restrictions Precautions Precautions: Fall Restrictions Weight Bearing Restrictions: No    Mobility  Bed Mobility Overal bed mobility: Needs Assistance Bed Mobility: Supine to Sit     Supine to sit: Min guard;+2 for safety/equipment     General bed mobility comments: able to perform functional bed moblity with min guard +2 today, extended time and cues for safety   Transfers Overall transfer level: Needs assistance Equipment used: Rolling walker (2 wheeled) Transfers: Sit to/from Stand Sit to Stand: +2 physical assistance;Min assist;Mod assist         General transfer comment: able to complete sit to stand from elevated surface with MinA of +2; however from lower surface requires ModA of +2, cues for safety and sequencing   Ambulation/Gait Ambulation/Gait assistance: +2 physical assistance;Min guard Ambulation Distance (Feet): 5 Feet Assistive device: Rolling walker (2 wheeled) Gait Pattern/deviations:  Step-to pattern;Decreased step length - right;Decreased step length - left;Shuffle;Trunk flexed;Wide base of support     General Gait Details: Shuffling steps from bed to recliner. Assist for balance and support. Verbal cues to stand more upright.   Stairs            Wheelchair Mobility    Modified Rankin (Stroke Patients Only)       Balance Overall balance assessment: Needs assistance Sitting-balance support: Bilateral upper extremity supported;Feet supported Sitting balance-Leahy Scale: Fair Sitting balance - Comments: able to maintain upright sitting at midline with Min guard    Standing balance support: Bilateral upper extremity supported;During functional activity Standing balance-Leahy Scale: Fair Standing balance comment: reliant on external support, min guard +2 for safety                             Cognition Arousal/Alertness: Awake/alert Behavior During Therapy: WFL for tasks assessed/performed Overall Cognitive Status: No family/caregiver present to determine baseline cognitive functioning Area of Impairment: Memory;Problem solving                   Current Attention Level: Selective Memory: Decreased short-term memory Following Commands: Follows multi-step commands with increased time     Problem Solving: Slow processing;Decreased initiation;Requires verbal cues;Requires tactile cues General Comments: pt needed encouraged and increased time to perform task      Exercises      General Comments        Pertinent Vitals/Pain Pain Assessment: No/denies pain Pain Score: 0-No pain Faces Pain Scale: No hurt Pain Intervention(s): Limited activity  within patient's tolerance;Monitored during session    Home Living                      Prior Function            PT Goals (current goals can now be found in the care plan section) Acute Rehab PT Goals Patient Stated Goal: return home PT Goal Formulation: With patient Time  For Goal Achievement: 08/15/17 Potential to Achieve Goals: Good Progress towards PT goals: Progressing toward goals    Frequency    Min 3X/week      PT Plan Current plan remains appropriate    Co-evaluation              AM-PAC PT "6 Clicks" Daily Activity  Outcome Measure  Difficulty turning over in bed (including adjusting bedclothes, sheets and blankets)?: Unable Difficulty moving from lying on back to sitting on the side of the bed? : Unable Difficulty sitting down on and standing up from a chair with arms (e.g., wheelchair, bedside commode, etc,.)?: Unable Help needed moving to and from a bed to chair (including a wheelchair)?: A Little Help needed walking in hospital room?: A Little Help needed climbing 3-5 steps with a railing? : A Lot 6 Click Score: 11    End of Session Equipment Utilized During Treatment: Gait belt;Oxygen Activity Tolerance: Patient tolerated treatment well Patient left: in chair;with call bell/phone within reach;with chair alarm set   PT Visit Diagnosis: Unsteadiness on feet (R26.81);Other abnormalities of gait and mobility (R26.89);Muscle weakness (generalized) (M62.81)     Time: 9923-4144 PT Time Calculation (min) (ACUTE ONLY): 31 min  Charges:  $Gait Training: 8-22 mins $Therapeutic Activity: 8-22 mins                    G Codes:       Deniece Ree PT, DPT, CBIS  Supplemental Physical Therapist [place]   Pager [phone]

## 2017-08-04 NOTE — Discharge Summary (Signed)
Physician Discharge Summary  Maurice Delgado IFO:[id number] DOB: [birth year] DOA: 07/29/2017  PCP: Maurice Reach, MD  Admit date: 07/29/2017 Discharge date: 08/04/2017  Time spent: > 35 minutes  Recommendations for Outpatient Follow-up:  1. Pt completed a 6 day course for non complicated UTI   Discharge Diagnoses:  Principal Problem:   Syncope Active Problems:   OSA on CPAP   HTN (hypertension)   History of DVT in adulthood   Acute on chronic systolic and diastolic heart failure, NYHA class 1 (HCC)   Nausea vomiting and diarrhea   Pacemaker   Acute on chronic respiratory failure with hypoxemia (HCC)   CKD (chronic kidney disease) stage 4, GFR 15-29 ml/min (HCC)   COPD with hypoxia (HCC)   Paroxysmal atrial fibrillation (HCC)   UTI (recent) w/ persistent back pain and ? hematuria/dysuria   Chronic edema of legs   Respiratory failure ([place])   Discharge Condition: stable  Diet recommendation: Heart healthy  Filed Weights   08/02/17 0514 08/03/17 0524 08/04/17 0500  Weight: (!) 136.5 kg (300 lb 14.9 oz) (!) 136.3 kg (300 lb 7.8 oz) 134.6 kg (296 lb 11.8 oz)    History of present illness:  Maurice Ishaqis a 84 y.o.malewith a Past Medical History of afib on Coumadin; OSA on CPAP; HTN; GERD; COPD on home O2; stage IV CKD; and chronic systolic HF (EF 67% in 6/72) who presents with weakness and a fall. In discussion with the patient and family, he developed diarrhea Sunday after dietary indiscretion (does not eat pork but ate a sausage biscuit that a friend brought over). He had multiple episodes of diarrhea. He also had significant dysuria and hesitancy on Saturday that has been ongoing and he was found to have some blood in his underwear today. His family reports a very sedentary lifestyle - he sleeps all day and stays up much of the night - and no change in his chronic LE edema and venous stasis. Today, he was in the bathroom with more loose stools when he was unable to  stand up because he was so weak. He fell and struck his head, but it does not sound as though he lost consciousness.  Hospital Course:  Acute respiratory failure due to CO2 retention - Resolved.   - Pt is on [place] with supplemental oxygen at 3 pt is on home o2 .  AMS - CT scan of head negative - suspect patient is at baseline  Syncope - Patient presents to ER via EMS after apparently having a syncopal episode while seated on the toilet;it is unclear as to whether patient had nausea,vomiting and diarrhea before or after syncopal event-patient denies recollection of straining whileattempting to pass bowel movement and states primarily went to bathroom to void - Echocardiogram reporting ef of 50-55% with no wall motion abnormality reported. - Cycle troponin- mildly elevated but trending down and no chest pain I suspect was secondary to demand ischemia - suspecting vaso vagal based on above information - PT recommending CIR but patient wishes to go home.  Fevers with leukocytosis - most likely 2ary to uti. Treated with 6 days of antibiotics. No symptoms reported and urine culture inconclusive   AKI on CKD IV -suspect from dehydration -renal U/S shows: Irregular diffuse wall thickening of the bladder consistent with infectious or inflammatory cystitis. No discrete bladder mass - improving  Chronic edema of legs -Patient sits with legs dependent despite having recliner available -Likely not reflective of true volume overload status -TSH normal  OSA -  CPAP at night  Paroxysmal atrial fibrillation  -Currently rate controlled and 100% ventricular paced -continue warfarin -Continue Betapace and carvedilol on d/c -Continue digoxin  -CHA2DS2-VASc=5  Nausea,vomiting and diarrhea - Apparent solitary occurrence, no new complaints reported today. - could have been secondary to food poisoning but clinically  undetermined   Procedures:  none  Consultations:  none  Discharge Exam: Vitals:   08/04/17 0840 08/04/17 0855  BP:    Pulse: 70   Resp:    Temp:    SpO2:  91%    General: Pt in nad, alert and awake Cardiovascular: rrr, no rubs Respiratory: no increased wob, no wheezes  Discharge Instructions   Discharge Instructions    Diet - low sodium heart healthy   Complete by:  As directed    Increase activity slowly   Complete by:  As directed      Allergies as of 08/04/2017      Reactions   Amiodarone    hyperthyroidism      Medication List    TAKE these medications   calcitRIOL 0.5 MCG capsule Commonly known as:  ROCALTROL Take 0.5 mcg by mouth every other day.   carvedilol 6.25 MG tablet Commonly known as:  COREG Take 1 tablet (6.25 mg total) by mouth 2 (two) times daily.   digoxin 0.125 MG tablet Commonly known as:  DIGITEK Take 1 tablet (125 mcg total) by mouth every morning.   NON FORMULARY [facility]  Urea Cream - 40% Urea Apply 1-2 grams to affected area 3-4 times daily Qty. 120 gm 3 refills   OXYGEN Inhale into the lungs. Is on Oxygen all day long; uses 2 Liters a day   pregabalin 150 MG capsule Commonly known as:  LYRICA Take 150 mg by mouth 2 (two) times daily.   sotalol 160 MG tablet Commonly known as:  BETAPACE TAKE 1 TABLET BY MOUTH  DAILY   spironolactone 25 MG tablet Commonly known as:  ALDACTONE TAKE ONE-HALF TABLET BY  MOUTH DAILY   tamsulosin 0.4 MG Caps capsule Commonly known as:  FLOMAX Take 0.4 mg by mouth every morning.   torsemide 20 MG tablet Commonly known as:  DEMADEX Take 4 tablets (80 mg) in the morning and 3 tablets (60 mg) in the afternoon   ULORIC 80 MG Tabs Generic drug:  Febuxostat Take 80 mg by mouth every morning.   umeclidinium-vilanterol 62.5-25 MCG/INH Aepb Commonly known as:  ANORO ELLIPTA TAKE 1 PUFF BY MOUTH EVERY DAY   vitamin C 1000 MG tablet Take 1,000 mg by mouth every morning.    warfarin 5 MG tablet Commonly known as:  COUMADIN Take as directed. If you are unsure how to take this medication, talk to your nurse or doctor. Original instructions:  Take as directed by coumadin clinic What changed:    how much to take  how to take this  when to take this  additional instructions      Allergies  Allergen Reactions  . Amiodarone     hyperthyroidism   Follow-up Information    Maurice Reach, MD. Go on 08/12/2017.   Specialty:  Internal Medicine Why:  @2 :00pm Contact information: [address] [place] [address] [phone]            The results of significant diagnostics from this hospitalization (including imaging, microbiology, ancillary and laboratory) are listed below for reference.    Significant Diagnostic Studies: Ct Head Wo Contrast  Result Date: 07/30/2017 CLINICAL DATA:  altered level  of consciousness. Fall yesterday. On Coumadin. EXAM: CT HEAD WITHOUT CONTRAST TECHNIQUE: Contiguous axial images were obtained from the base of the skull through the vertex without intravenous contrast. COMPARISON:  1 day prior FINDINGS: Brain: Moderate low density in the periventricular white matter likely related to small vessel disease. With No mass lesion, hemorrhage, hydrocephalus, acute infarct, intra-axial, or extra-axial fluid collection. Vascular: Intracranial atherosclerosis. Skull: Again identified is right supraorbital mild scalp soft tissue swelling. Normal skull. Sinuses/Orbits: Normal imaged portions of the orbits and globes. Mucosal thickening of bilateral maxillary sinuses. Mucous retention cyst or polyp in the left sphenoid sinus. Clear mastoid air cells. Other: None. IMPRESSION: 1.  No acute intracranial abnormality. 2. Moderate small vessel ischemic change. 3. Sinus disease. 4. Minimal right supraorbital/frontal scalp soft tissue swelling, similar. Electronically Signed   By: Abigail Miyamoto M.D.   On: 07/30/2017 13:14   Ct Head Wo  Contrast  Result Date: 07/29/2017 CLINICAL DATA:  Recent syncopal episode with head trauma EXAM: CT HEAD WITHOUT CONTRAST TECHNIQUE: Contiguous axial images were obtained from the base of the skull through the vertex without intravenous contrast. COMPARISON:  None. FINDINGS: Brain: Scattered chronic white matter ischemic change is identified. No findings to suggest acute hemorrhage, acute infarction or space-occupying mass lesion are noted. Vascular: No hyperdense vessel or unexpected calcification. Skull: Normal. Negative for fracture or focal lesion. Sinuses/Orbits: No acute finding. Other: Very mild soft tissue swelling is noted anteriorly in the right forehead. IMPRESSION: Chronic ischemic change without acute abnormality. Electronically Signed   By: Inez Catalina M.D.   On: 07/29/2017 11:48   US Renal  Result Date: 07/29/2017 CLINICAL DATA:  Hematuria.  Stage 4 chronic kidney disease. EXAM: RENAL / URINARY TRACT ULTRASOUND COMPLETE COMPARISON:  None. FINDINGS: Right Kidney: Length: 10.7 cm. Increased renal parenchymal echogenicity. Renal cortical thinning. Small cyst arises from the anterior midpole measuring 11 mm. No other renal masses, no stones and no hydronephrosis. Left Kidney: Length: 11.3 cm. Increased parenchymal echogenicity. Mild renal cortical thinning. 14 mm midpole cyst. There is a less well-defined cystic lesion arising from the upper pole measuring 4 x 2.5 x 3 cm with apparent wall calcification. No other renal masses. No hydronephrosis. Bladder: Bladder is mildly distended. There is diffuse irregular wall thickening without a discrete mass. No bladder stone. IMPRESSION: 1. Irregular diffuse wall thickening of the bladder consistent with infectious or inflammatory cystitis. No discrete bladder mass. 2. Findings of medical renal disease with increased renal parenchymal echogenicity. No hydronephrosis. 3. Renal cysts. One from the upper pole of the left kidney is mildly complicated.  Electronically Signed   By: Lajean Manes M.D.   On: 07/29/2017 17:23   Dg Chest Port 1 View  Result Date: 08/01/2017 CLINICAL DATA:  Acute respiratory failure EXAM: PORTABLE CHEST - 1 VIEW COMPARISON:  the previous day's study FINDINGS: Patient has been extubated and the nasogastric tube removed. Left subclavian AICD stable. Mild bibasilar interstitial edema or infiltrates have slightly increased. Suspect mild central pulmonary vascular congestion. Heart size and mediastinal contours are within normal limits. Aortic Atherosclerosis (ICD10-170.0) No definite effusion although the lateral costophrenic angles are partially excluded. No pneumothorax. Visualized bones unremarkable. IMPRESSION: 1. Extubation with slight worsening of bibasilar interstitial edema or infiltrates. Electronically Signed   By: Lucrezia Europe M.D.   On: 08/01/2017 08:14   Dg Chest Port 1 View  Result Date: 07/31/2017 CLINICAL DATA:  Respiratory failure.  History of COPD. EXAM: PORTABLE CHEST 1 VIEW COMPARISON:  07/30/2017. FINDINGS: Lines and  tubes appear to be in stable position. Most portion hemidiaphragms and costophrenic angles not imaged. AICD in stable position. Stable cardiomegaly. Diffuse bilateral pulmonary interstitial prominence noted. Mild CHF cannot be excluded. No pleural effusion or pneumothorax. IMPRESSION: 1.  Lines and tubes appear to be in stable position. 2. AICD in stable position. Cardiomegaly with pulmonary venous congestion and mild bilateral interstitial prominence. Mild CHF cannot be excluded on today's exam. Electronically Signed   By: Marcello Moores  Register   On: 07/31/2017 09:04   Dg Chest Port 1 View  Result Date: 07/30/2017 CLINICAL DATA:  Endotracheal placement. EXAM: PORTABLE CHEST 1 VIEW COMPARISON:  07/29/2017 FINDINGS: Endotracheal tube tip is 6 cm above the carina. Pacemaker/AICD remains in place. Nasogastric tube enters the abdomen. There is venous hypertension without frank edema. Heart size upper limits  of normal. Aortic atherosclerosis. No infiltrate, collapse or effusion. IMPRESSION: Endotracheal tube and nasogastric tube well positioned. Venous hypertension without frank edema, infiltrate or collapse. Electronically Signed   By: Nelson Chimes M.D.   On: 07/30/2017 16:22   Dg Chest Portable 1 View  Result Date: 07/29/2017 CLINICAL DATA:  CHF, syncope EXAM: PORTABLE CHEST 1 VIEW COMPARISON:  01/09/2016 FINDINGS: Mild bilateral interstitial thickening and prominence of the central pulmonary vasculature. No pleural effusion or pneumothorax. Stable cardiomegaly. Three lead cardiac pacemaker is noted. No acute osseous abnormality. IMPRESSION: Findings concerning for mild CHF. Electronically Signed   By: Kathreen Devoid   On: 07/29/2017 11:31    Microbiology: Recent Results (from the past 240 hour(s))  MRSA PCR Screening     Status: None   Collection Time: 07/29/17  5:30 PM  Result Value Ref Range Status   MRSA by PCR NEGATIVE NEGATIVE Final    Comment:        The GeneXpert MRSA Assay (FDA approved for NASAL specimens only), is one component of a comprehensive MRSA colonization surveillance program. It is not intended to diagnose MRSA infection nor to guide or monitor treatment for MRSA infections.   Culture, blood (Routine X 2) w Reflex to ID Panel     Status: None (Preliminary result)   Collection Time: 07/30/17 10:57 AM  Result Value Ref Range Status   Specimen Description BLOOD RIGHT ANTECUBITAL  Final   Special Requests   Final    BOTTLES DRAWN AEROBIC AND ANAEROBIC Blood Culture adequate volume   Culture   Final    NO GROWTH 4 DAYS Performed at [facility], [address], [place], [place] [address]    Report Status PENDING  Incomplete  Culture, blood (Routine X 2) w Reflex to ID Panel     Status: None (Preliminary result)   Collection Time: 07/30/17 11:00 AM  Result Value Ref Range Status   Specimen Description BLOOD RIGHT ANTECUBITAL  Final   Special Requests   Final     BOTTLES DRAWN AEROBIC AND ANAEROBIC Blood Culture adequate volume   Culture   Final    NO GROWTH 4 DAYS Performed at [facility], [place] [address], [place], [place] [address]    Report Status PENDING  Incomplete  Culture, Urine     Status: Abnormal   Collection Time: 07/30/17  2:46 PM  Result Value Ref Range Status   Specimen Description URINE, RANDOM  Final   Special Requests NONE  Final   Culture MULTIPLE SPECIES PRESENT, SUGGEST RECOLLECTION (A)  Final   Report Status 07/31/2017 FINAL  Final  Gastrointestinal Panel by PCR , Stool  Status: None   Collection Time: 08/02/17 12:53 PM  Result Value Ref Range Status   Campylobacter species NOT DETECTED NOT DETECTED Final   Plesimonas shigelloides NOT DETECTED NOT DETECTED Final   Salmonella species NOT DETECTED NOT DETECTED Final   Yersinia enterocolitica NOT DETECTED NOT DETECTED Final   Vibrio species NOT DETECTED NOT DETECTED Final   Vibrio cholerae NOT DETECTED NOT DETECTED Final   Enteroaggregative E coli (EAEC) NOT DETECTED NOT DETECTED Final   Enteropathogenic E coli (EPEC) NOT DETECTED NOT DETECTED Final   Enterotoxigenic E coli (ETEC) NOT DETECTED NOT DETECTED Final   Shiga like toxin producing E coli (STEC) NOT DETECTED NOT DETECTED Final   Shigella/Enteroinvasive E coli (EIEC) NOT DETECTED NOT DETECTED Final   Cryptosporidium NOT DETECTED NOT DETECTED Final   Cyclospora cayetanensis NOT DETECTED NOT DETECTED Final   Entamoeba histolytica NOT DETECTED NOT DETECTED Final   Giardia lamblia NOT DETECTED NOT DETECTED Final   Adenovirus F40/41 NOT DETECTED NOT DETECTED Final   Astrovirus NOT DETECTED NOT DETECTED Final   Norovirus GI/GII NOT DETECTED NOT DETECTED Final   Rotavirus A NOT DETECTED NOT DETECTED Final   Sapovirus (I, II, IV, and V) NOT DETECTED NOT DETECTED Final    Comment: Performed at [facility], [address] [place], [place] [address]     Labs: Basic Metabolic Panel: Recent Labs   Lab 07/29/17 1057 07/30/17 0157 07/30/17 1516 07/31/17 0350 08/01/17 0510  NA 140 141 141 143 145  K 3.9 5.4* 5.3* 4.2 4.4  CL 99* 98* 102 106 108  CO2 29 29 26 25 27   GLUCOSE 108* 98 113* 105* 101*  BUN 42* 46* 56* 58* 56*  CREATININE 2.99* 3.73* 4.08* 3.85* 3.05*  CALCIUM 9.0 8.7* 7.9* 8.0* 8.6*  MG  --   --   --   --  2.0  PHOS  --   --   --   --  3.5   Liver Function Tests: Recent Labs  Lab 07/29/17 1057 07/30/17 0157  AST 25 45*  ALT 14* 20  ALKPHOS 76 61  BILITOT 1.0 0.8  PROT 6.5 6.8  ALBUMIN 3.1* 3.0*   Recent Labs  Lab 07/29/17 1057  LIPASE 45   Recent Labs  Lab 07/30/17 1449  AMMONIA 33   CBC: Recent Labs  Lab 07/30/17 0157 07/30/17 1516 07/31/17 0350 08/01/17 0510 08/03/17 1030  WBC 26.0* 19.0* 16.2* 17.4* 8.7  NEUTROABS  --   --   --   --  6.9  HGB 11.8* 11.0* 10.0* 10.3* 10.5*  HCT 40.6 38.7* 33.3* 34.0* 35.7*  MCV 92.9 92.6 88.8 88.3 91.3  PLT 103* 72* 64* 76* 89*   Cardiac Enzymes: Recent Labs  Lab 07/29/17 1359 07/29/17 1913 07/30/17 0157 07/31/17 0905 07/31/17 1453  TROPONINI 0.04* 0.04* 0.12* 1.11* 0.90*   BNP: BNP (last 3 results) Recent Labs    07/29/17 1057  BNP 242.5*    ProBNP (last 3 results) No results for input(s): PROBNP in the last 8760 hours.  CBG: Recent Labs  Lab 07/31/17 0338 07/31/17 0804 07/31/17 1133 07/31/17 1600 07/31/17 2003  GLUCAP 88 90 99 102* 142*       Signed:  Velvet Bathe MD.  [facility] 08/04/2017, 12:34 PM

## 2017-08-04 NOTE — Plan of Care (Signed)
  Elimination: Will not experience complications related to bowel motility 08/04/2017 0452 - Completed/Met by Evert Kohl, RN

## 2017-08-04 NOTE — Progress Notes (Signed)
ANTICOAGULATION CONSULT NOTE - Follow Up Consult  Pharmacy Consult:  Coumadin Indication: atrial fibrillation  Allergies  Allergen Reactions  . Amiodarone     hyperthyroidism    Patient Measurements: Height: 6\' 4"  (193 cm) Weight: 296 lb 11.8 oz (134.6 kg) IBW/kg (Calculated) : 86.8  Vital Signs: Temp: 98.7 F (37.1 C) (02/04 0500) Temp Source: Oral (02/04 0500) BP: 130/65 (02/04 0500) Pulse Rate: 70 (02/04 0840)  Labs: Recent Labs    08/02/17 0451 08/03/17 0749 08/03/17 1030 08/04/17 0701  HGB  --   --  10.5*  --   HCT  --   --  35.7*  --   PLT  --   --  89*  --   LABPROT 29.8* 30.4*  --  30.3*  INR 2.86 2.94  --  2.93    Estimated Creatinine Clearance: 28 mL/min (A) (by C-G formula based on SCr of 3.05 mg/dL (H)).   Assessment: 68 YOM with history of Afib on Coumadin PTA.  Coumadin has been on hold for several days prior to its resumption on 08/02/17.  INR currently therapeutic and toward the high end of goal.  No bleeding reported.  Home dose of warfarin 7.5mg  MWF and 5mg  TTSS.   Goal of Therapy:  INR 2-3 Monitor platelets by anticoagulation protocol: Yes    Plan:  Try Coumadin 2mg  PO today to gauge response Daily PT / INR F/U with abx LOT   Skyann Ganim D. Mina Marble, PharmD, BCPS Pager:  [phone] 08/04/2017, 10:22 AM

## 2017-08-04 NOTE — Care Management Important Message (Signed)
Important Message  Patient Details  Name: Maurice Delgado MRN: [id number] Date of Birth: 08/01/1933   Medicare Important Message Given:  Yes    Oyuki Hogan P Jazzlyn Huizenga 08/04/2017, 3:25 PM

## 2017-08-04 NOTE — Clinical Social Work Note (Signed)
Clinical Social Work Assessment  Patient Details  Name: Maurice Delgado MRN: [id number] Date of Birth: 06-29-1934  Date of referral:  08/04/17               Reason for consult:  Facility Placement, Discharge Planning                Permission sought to share information with:  Facility Sport and exercise psychologist, Family Supports Permission granted to share information::  Yes, Verbal Permission Granted  Name::     Maurice Delgado  Agency::     Relationship::  Wife  Contact Information:  [phone]  Housing/Transportation Living arrangements for the past 2 months:  Single Family Home Source of Information:  Patient, Medical Team, Spouse Patient Interpreter Needed:  None Criminal Activity/Legal Involvement Pertinent to Current Situation/Hospitalization:  No - Comment as needed Significant Relationships:  Spouse, Adult Children Lives with:  Adult Children, Spouse Do you feel safe going back to the place where you live?  Yes Need for family participation in patient care:  Yes (Comment)  Care giving concerns:  PT recommending CIR once discharged. CSW met with patient to discuss SNF backup.   Social Worker assessment / plan:  CSW met with patient. Wife at bedside. CSW introduced role and explained that PT recommendations would be discussed. Patient deferred decision making to his wife, stating she is "the boss." Patient's wife prefers for him to return home with [facility] rather than CIR or SNF. Patient is agreeable. No further concerns. Patient is discharging home today. CSW signing off as social work intervention is no longer needed.  Employment status:  Retired Nurse, adult PT Recommendations:  Inpatient [place] / Referral to community resources:  [place]  Patient/Family's Response to care:  Patient and his wife prefer home health. Patient's family supportive and involved in patient's care. Patient and his  wife appreciated social work intervention.  Patient/Family's Understanding of and Emotional Response to Diagnosis, Current Treatment, and Prognosis:  Patient and his wife have a good understanding of the reason for admission and the need for continued therapy once discharged. Patient and his wife appear happy with hospital care.  Emotional Assessment Appearance:  Appears stated age Attitude/Demeanor/Rapport:  Engaged, Gracious Affect (typically observed):  Appropriate, Calm, Pleasant Orientation:  Oriented to Self, Oriented to Place, Oriented to  Time, Oriented to Situation Alcohol / Substance use:  Never Used Psych involvement (Current and /or in the community):  No (Comment)  Discharge Needs  Concerns to be addressed:  Care Coordination Readmission within the last 30 days:  No Current discharge risk:  Dependent with Mobility Barriers to Discharge:  No Barriers Identified   Candie Chroman, LCSW 08/04/2017, 1:14 PM

## 2017-08-04 NOTE — Care Management Note (Signed)
Case Management Note  Patient Details  Name: Maurice Delgado MRN: [id number] Date of Birth: 07-23-1933  Subjective/Objective:       Syncope            Action/Plan: Patient lives with spouse; he is refusing Inpt rehab at this time and is requesting to return home with [facility] services provided by [facility]; [facility] with [facility] called for arrangement.  Expected Discharge Date:  08/04/17               In-House Referral:  Clinical Social Work  Discharge planning Services  CM Consult  Post Acute Care Choice:    Choice offered to:  Patient  HH Arranged:  PT [place] Agency:  [place]  Status of Service:  Completed, signed off  Sherrilyn Rist [phone] 08/04/2017, 1:18 PM

## 2017-08-28 ENCOUNTER — Telehealth: Payer: Self-pay | Admitting: Cardiology

## 2017-08-28 ENCOUNTER — Ambulatory Visit (INDEPENDENT_AMBULATORY_CARE_PROVIDER_SITE_OTHER): Payer: Medicare HMO | Admitting: *Deleted

## 2017-08-28 DIAGNOSIS — I428 Other cardiomyopathies: Secondary | ICD-10-CM | POA: Diagnosis not present

## 2017-08-28 NOTE — Telephone Encounter (Signed)
Spoke w/ pt and attempted to help pt trouble shoot monitor. After one unsuccessful attempt. Sent e-mail to [employer] for them to call pt to help trouble shoot monitor.

## 2017-08-29 ENCOUNTER — Encounter: Payer: Self-pay | Admitting: Cardiology

## 2017-08-29 NOTE — Progress Notes (Signed)
Remote ICD transmission.   

## 2017-09-01 ENCOUNTER — Other Ambulatory Visit: Payer: Self-pay

## 2017-09-01 ENCOUNTER — Encounter (HOSPITAL_COMMUNITY): Payer: Self-pay | Admitting: Cardiology

## 2017-09-01 ENCOUNTER — Ambulatory Visit (HOSPITAL_COMMUNITY)
Admission: RE | Admit: 2017-09-01 | Discharge: 2017-09-01 | Disposition: A | Discharge status: Home / Self Care | Payer: Medicare HMO | Source: Ambulatory Visit | Attending: Cardiology | Admitting: Cardiology

## 2017-09-01 VITALS — BP 111/70 | HR 85 | Wt 300.5 lb

## 2017-09-01 DIAGNOSIS — Z87891 Personal history of nicotine dependence: Secondary | ICD-10-CM | POA: Diagnosis not present

## 2017-09-01 DIAGNOSIS — Z79899 Other long term (current) drug therapy: Secondary | ICD-10-CM | POA: Insufficient documentation

## 2017-09-01 DIAGNOSIS — I480 Paroxysmal atrial fibrillation: Secondary | ICD-10-CM

## 2017-09-01 DIAGNOSIS — Z7901 Long term (current) use of anticoagulants: Secondary | ICD-10-CM | POA: Insufficient documentation

## 2017-09-01 DIAGNOSIS — I442 Atrioventricular block, complete: Secondary | ICD-10-CM | POA: Diagnosis not present

## 2017-09-01 DIAGNOSIS — I428 Other cardiomyopathies: Secondary | ICD-10-CM

## 2017-09-01 DIAGNOSIS — G4733 Obstructive sleep apnea (adult) (pediatric): Secondary | ICD-10-CM | POA: Diagnosis not present

## 2017-09-01 DIAGNOSIS — Z888 Allergy status to other drugs, medicaments and biological substances status: Secondary | ICD-10-CM | POA: Diagnosis not present

## 2017-09-01 DIAGNOSIS — K219 Gastro-esophageal reflux disease without esophagitis: Secondary | ICD-10-CM | POA: Insufficient documentation

## 2017-09-01 DIAGNOSIS — N184 Chronic kidney disease, stage 4 (severe): Secondary | ICD-10-CM | POA: Diagnosis not present

## 2017-09-01 DIAGNOSIS — J449 Chronic obstructive pulmonary disease, unspecified: Secondary | ICD-10-CM | POA: Diagnosis not present

## 2017-09-01 DIAGNOSIS — I5022 Chronic systolic (congestive) heart failure: Secondary | ICD-10-CM | POA: Diagnosis present

## 2017-09-01 DIAGNOSIS — Z95810 Presence of automatic (implantable) cardiac defibrillator: Secondary | ICD-10-CM | POA: Diagnosis not present

## 2017-09-01 DIAGNOSIS — M109 Gout, unspecified: Secondary | ICD-10-CM | POA: Insufficient documentation

## 2017-09-01 DIAGNOSIS — Z9981 Dependence on supplemental oxygen: Secondary | ICD-10-CM | POA: Diagnosis not present

## 2017-09-01 DIAGNOSIS — I5042 Chronic combined systolic (congestive) and diastolic (congestive) heart failure: Secondary | ICD-10-CM | POA: Insufficient documentation

## 2017-09-01 DIAGNOSIS — I5032 Chronic diastolic (congestive) heart failure: Secondary | ICD-10-CM | POA: Diagnosis not present

## 2017-09-01 DIAGNOSIS — Z86718 Personal history of other venous thrombosis and embolism: Secondary | ICD-10-CM | POA: Diagnosis not present

## 2017-09-01 DIAGNOSIS — Z8249 Family history of ischemic heart disease and other diseases of the circulatory system: Secondary | ICD-10-CM | POA: Insufficient documentation

## 2017-09-01 MED ORDER — METOLAZONE 2.5 MG PO TABS: 2.5000 mg | ORAL_TABLET | ORAL | 3 refills | Status: AC

## 2017-09-01 MED ORDER — POTASSIUM CHLORIDE CRYS ER 20 MEQ PO TBCR: 20.0000 meq | EXTENDED_RELEASE_TABLET | ORAL | 3 refills | Status: AC

## 2017-09-01 MED ORDER — TORSEMIDE 20 MG PO TABS: 80.0000 mg | ORAL_TABLET | Freq: Two times a day (BID) | ORAL | 3 refills | Status: AC

## 2017-09-01 NOTE — Patient Instructions (Addendum)
Stop Digoxin  Start Metolazone 2.5 mg (1 tab) every Tuesday morning with Torsemide.  Start Potassium 20 meq (1 tab) Every Tuesday with Metolazone   Increase Torsemide 80 mg (4 tabs), twice a day  You have been referred to Cardiac Rehab (they will call you)   You have been referred to [facility] for [place] physician recommends that you return for lab work in: 7 days Rx given    Your physician recommends that you schedule a follow-up appointment in: 2 weeks with [facility]

## 2017-09-02 ENCOUNTER — Other Ambulatory Visit (HOSPITAL_COMMUNITY): Payer: Self-pay

## 2017-09-02 DIAGNOSIS — I5022 Chronic systolic (congestive) heart failure: Secondary | ICD-10-CM

## 2017-09-02 NOTE — Progress Notes (Signed)
Patient ID: Maurice Delgado, male   DOB: Apr 14, 1934, 84 y.o.   MRN: [id number]    Advanced Heart Failure Clinic Note   PCP: Dr. Jonelle Sidle HF Cardiology: Dr Aundra Dubin Nephrology: Dr Deterding  Maurice Delgado is a 84 y.o. male with history of chronic systolic CHF due to NICM, CHB s/p Medtronic CRT-D 2002 with generator change 2011, HTN, history of DVT on chronic anticoag with coumadin, COPD on home O2, PAF, CKD stage III.  He went into atrial fibrillation in 7/17 and had TEE-guided DCCV.    He was admitted in 1/19 with a syncopal episode while using the bathroom, possibly vasovagal syncope in the setting of gastroenteritis. He was noted to have AKI from dehydration.  Diuretic restarted prior to discharge. Echo in 1/19 showed EF up to 50-55%.   Patient returns for followup of CHF.  He is not in atrial fibrillation today (a-paced). He remains very inactive. He uses a walker for balance.  Weight is up about 10 lbs.  He is short of breath if he tries to move fast, does ok with slow walking.  No dyspnea at rest . He is very short of breath with stairs.  He is wearing compression stockings.  No chest pain.  No further lightheadedness or syncope.  Generalized weakness.  No orthopnea/PND.   Medtronic device interrogated: fluid index > threshold, decreased impedance, rare atrial fibrillation (short episodes), no VT, >99% BiV pacing, minimal daily activity (15 min).   Labs (1/17): K 3.6, creatinine 1.56 Labs 07/20/2015: K 4.5 Creatinine 2.33, digoxin 0.5  Labs (2/17): K 4.3, creatinine 2.24 Labs (3/17) K, 4.4, Creatinine 1.73, Dig 0.5 Lab (10/12/2015): INR 2.4  Labs (10/17/2015): K 4.1 Creatinine 1.77 Labs (01/2016): dig level 0.5 K 4.2 Creatinine 1.44 Labs (10/17): K 3.8, creatinine 1.71 Labs (3/18): digoxin 0.3 Labs (4/18): K 4.3, creatinine 1.8 Labs (6/18): digoxin 0.7 Labs (9/18): K 4.1, creatinine 2.29 Labs (2/19): K 4.4, creatinine 3.05, hgb 10.5  ECG: Personally reviewed, a-paced, BiV paced, QTc 499  msec  PMH: 1. Sciatica 2. H/o DVT: Recurrent.  Has IVC filter 3. Complete heart block: Medtronic CRT-D device placed 2002.  4. Atrial fibrillation: Paroxysmal. On sotalol to maintain NSR.  - TEE-guided DCCV in 7/17.  5. OSA: No longer uses CPAP.  6. Chronic systolic CHF: Echo (9/02) with EF 20-25%, mildly dilated LV, normal RV size and systolic function, mild MR.  Nonischemic cardiomyopathy.  Has MDT CRT-D device.  - Hypotension with low dose Bidil.  - TEE (7/17): EF 30%, diffuse hypokinesis, mildly dilated RV with mildly decreased systolic function.  7. COPD: On home oxygen.  8. History of retroperitoneal hemorrhage.  9. CKD: Stage IV.  10. Gout 11. GERD 12. Atrial tachycardia: s/p ablation 2014.  13. Sleep study negative 14. ABIs (2/18): Normal.   Social History   Socioeconomic History  . Marital status: Married    Spouse name: Not on file  . Number of children: Not on file  . Years of education: Not on file  . Highest education level: Not on file  Social Needs  . Financial resource strain: Not on file  . Food insecurity - worry: Not on file  . Food insecurity - inability: Not on file  . Transportation needs - medical: Not on file  . Transportation needs - non-medical: Not on file  Occupational History  . Occupation: reitred    Comment: from education  Tobacco Use  . Smoking status: Former Smoker    Packs/day: 1.50  Years: 30.00    Pack years: 45.00    Types: Cigarettes  . Smokeless tobacco: Never Used  . Tobacco comment: 06/21/2013 "quit smoking ~ 1986"  Substance and Sexual Activity  . Alcohol use: No    Comment: 06/21/2013 "quit drinking ~ 1970"  . Drug use: Yes    Types: Marijuana    Comment: 06/21/2013 "stopped using marijuana ~ 1970"  . Sexual activity: Not Currently  Other Topics Concern  . Not on file  Social History Narrative   Married and has 1 son.    Lives in [place].  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   Family History  Problem Relation Age of  Onset  . Heart disease Mother        mother also had Rheumatism.  Marland Kitchen Hypertension Mother   . Heart attack Neg Hx   . Stroke Neg Hx    ROS: All systems reviewed and negative except as per HPI.   Current Outpatient Medications  Medication Sig Dispense Refill  . Ascorbic Acid (VITAMIN C) 1000 MG tablet Take 1,000 mg by mouth every morning.     . calcitRIOL (ROCALTROL) 0.5 MCG capsule Take 0.5 mcg by mouth every other day.     . carvedilol (COREG) 6.25 MG tablet Take 1 tablet (6.25 mg total) by mouth 2 (two) times daily. 180 tablet 3  . Febuxostat (ULORIC) 80 MG TABS Take 80 mg by mouth every morning.     . NON FORMULARY [facility]  Urea Cream - 40% Urea Apply 1-2 grams to affected area 3-4 times daily Qty. 120 gm 3 refills    . OXYGEN Inhale into the lungs. Is on Oxygen all day long; uses 2 Liters a day    . pregabalin (LYRICA) 150 MG capsule Take 150 mg by mouth 2 (two) times daily.      . sotalol (BETAPACE) 160 MG tablet TAKE 1 TABLET BY MOUTH  DAILY 90 tablet 3  . spironolactone (ALDACTONE) 25 MG tablet TAKE ONE-HALF TABLET BY  MOUTH DAILY 45 tablet 3  . Tamsulosin HCl (FLOMAX) 0.4 MG CAPS Take 0.4 mg by mouth every morning.     . torsemide (DEMADEX) 20 MG tablet Take 4 tablets (80 mg total) by mouth 2 (two) times daily. 240 tablet 3  . umeclidinium-vilanterol (ANORO ELLIPTA) 62.5-25 MCG/INH AEPB TAKE 1 PUFF BY MOUTH EVERY DAY 180 each 1  . warfarin (COUMADIN) 5 MG tablet Take as directed by coumadin clinic (Patient taking differently: Take 5-7.5 mg by mouth See admin instructions. Take as directed by coumadin clinic.  Tuesday, Thursday,Saturday, Sunday 5mg  . Monday, Wednesday, Friday 7.5mg ) 150 tablet 1  . metolazone (ZAROXOLYN) 2.5 MG tablet Take 1 tablet (2.5 mg total) by mouth once a week. Take every Tuesday morning with Torsemide with an extra Potassium 20 meq 6 tablet 3  . potassium chloride SA (K-DUR,KLOR-CON) 20 MEQ tablet Take 1 tablet (20 mEq total) by mouth once a week.  Take 20 meq 1 tab) with metolazone 6 tablet 3   No current facility-administered medications for this encounter.    BP 111/70   Pulse 85   Wt (!) 300 lb 8 oz (136.3 kg)   SpO2 95% Comment: on 2L of O2  BMI 36.58 kg/m    Wt Readings from Last 3 Encounters:  09/01/17 (!) 300 lb 8 oz (136.3 kg)  08/04/17 296 lb 11.8 oz (134.6 kg)  06/02/17 290 lb (131.5 kg)   General: NAD Neck: JVP difficult, no thyromegaly or thyroid nodule.  Lungs: Distant breath sounds CV: Nondisplaced PMI.  Heart regular S1/S2, no S3/S4, no murmur.  2+ edema to thighs.  No carotid bruit.  Unable to palpate pedal pulses due to edema.   Abdomen: Soft, nontender, no hepatosplenomegaly, no distention.  Skin: Intact without lesions or rashes.  Neurologic: Alert and oriented x 3.  Psych: Normal affect. Extremities: No clubbing or cyanosis.  HEENT: Normal.  .  Assessment/Plan: 1. Atrial fibrillation: Paroxysmal. TEE-guided DCCV in 7/17. He is a-paced today. Occasional very short runs of AF on device assessment today.  - Continue sotalol, QTc acceptable with paced rhythm. Allergic to amiodarone.  - Continue coumadin per coumadin clinic.   2. COPD: Stable on home 02 2L via [place]. Likely a significant contributor to his dyspnea.   3. Chronic systolic => diastolic CHF: Nonischemic cardiomyopathy.  MDT CRT-D device. On most recent echo in 1/19, EF was improved to 50-55%.  NYHA class IIIb symptoms, suspect mixed COPD, CHF, and deconditioning.  On exam and by Optivol, he is volume overloaded.  - Increase torsemide to 80 mg bid with BMET today and in 7 days.  - Metolazone 2.5 mg with am torsemide every Tuesday.  He will take KCl 20 on metolazone days.      - Has not tolerated Bidil due to symptomatic low BP.  - Stop digoxin with improved EF and CKD stage IV.       - No ACEI/ARB/ARNI with elevated creatinine. - Continue Coreg 6.25 mg bid, no room to increase with soft BP.   - Continue spironolactone 12.5 mg daily but will need  to follow K closely.   - Needs to get more active, I will refer again to cardiac rehab at [facility].  - Place unna boots.  4. CKD: Stage IV.   - BMET today and again in 7 days.   5. Hx of DVT: Coumadin as above.    Followup with NP/PA in 2 wks.   Loralie Champagne, MD  09/02/2017

## 2017-09-03 ENCOUNTER — Other Ambulatory Visit: Payer: Self-pay | Admitting: *Deleted

## 2017-09-03 ENCOUNTER — Other Ambulatory Visit: Payer: Self-pay | Admitting: Cardiology

## 2017-09-03 DIAGNOSIS — I5042 Chronic combined systolic (congestive) and diastolic (congestive) heart failure: Secondary | ICD-10-CM

## 2017-09-04 LAB — BASIC METABOLIC PANEL
BUN / CREAT RATIO: 12 (ref 10–24)
BUN: 22 mg/dL (ref 8–27)
CALCIUM: 9.4 mg/dL (ref 8.6–10.2)
CHLORIDE: 100 mmol/L (ref 96–106)
CO2: 34 mmol/L — ABNORMAL HIGH (ref 20–29)
Creatinine, Ser: 1.87 mg/dL — ABNORMAL HIGH (ref 0.76–1.27)
GFR calc non Af Amer: 33 mL/min/{1.73_m2} — ABNORMAL LOW (ref >59)
GFR, EST AFRICAN AMERICAN: 38 mL/min/{1.73_m2} — AB (ref >59)
Glucose: 108 mg/dL — ABNORMAL HIGH (ref 65–99)
POTASSIUM: 4 mmol/L (ref 3.5–5.2)
Sodium: 147 mmol/L — ABNORMAL HIGH (ref 134–144)

## 2017-09-04 LAB — SPECIMEN STATUS REPORT

## 2017-09-10 ENCOUNTER — Other Ambulatory Visit (HOSPITAL_COMMUNITY): Payer: Self-pay

## 2017-09-10 DIAGNOSIS — I5042 Chronic combined systolic (congestive) and diastolic (congestive) heart failure: Secondary | ICD-10-CM

## 2017-09-10 NOTE — Progress Notes (Signed)
bmet  

## 2017-09-12 ENCOUNTER — Telehealth: Payer: Self-pay | Admitting: *Deleted

## 2017-09-12 ENCOUNTER — Other Ambulatory Visit (HOSPITAL_COMMUNITY): Payer: Self-pay | Admitting: Cardiology

## 2017-09-12 NOTE — Telephone Encounter (Signed)
Received a call from the pt & he stated that he hasn't been seen in our office in a while. Pt was hospitalized from 07/29/17-08/04/17 after having a fall and having diarrhea. He stated he has Home Health and Physical Therapy through [facility]. Pt unable to come on Tuesday therefore called [facility] & spoke with Sharee Pimple & she stated that the RN-Tracy is going back on Tuesday for Nursing Services, gave her an order to check an INR on Tuesday 09/16/17 & call to Coumadin Clinic & our number was provided. Called the pt back & spoke with him & updated him that [facility] would check him & call results to us& he was agreeable with the plan.

## 2017-09-15 ENCOUNTER — Ambulatory Visit (HOSPITAL_COMMUNITY)
Admission: RE | Admit: 2017-09-15 | Discharge: 2017-09-15 | Disposition: A | Discharge status: Home / Self Care | Payer: Medicare HMO | Source: Ambulatory Visit | Attending: Cardiology | Admitting: Cardiology

## 2017-09-15 VITALS — BP 98/72 | HR 77 | Wt 300.4 lb

## 2017-09-15 DIAGNOSIS — M109 Gout, unspecified: Secondary | ICD-10-CM | POA: Diagnosis not present

## 2017-09-15 DIAGNOSIS — Z9981 Dependence on supplemental oxygen: Secondary | ICD-10-CM | POA: Insufficient documentation

## 2017-09-15 DIAGNOSIS — K219 Gastro-esophageal reflux disease without esophagitis: Secondary | ICD-10-CM | POA: Diagnosis not present

## 2017-09-15 DIAGNOSIS — N184 Chronic kidney disease, stage 4 (severe): Secondary | ICD-10-CM | POA: Insufficient documentation

## 2017-09-15 DIAGNOSIS — I442 Atrioventricular block, complete: Secondary | ICD-10-CM | POA: Insufficient documentation

## 2017-09-15 DIAGNOSIS — N183 Chronic kidney disease, stage 3 unspecified: Secondary | ICD-10-CM

## 2017-09-15 DIAGNOSIS — Z86718 Personal history of other venous thrombosis and embolism: Secondary | ICD-10-CM | POA: Insufficient documentation

## 2017-09-15 DIAGNOSIS — Z87891 Personal history of nicotine dependence: Secondary | ICD-10-CM | POA: Insufficient documentation

## 2017-09-15 DIAGNOSIS — Z7901 Long term (current) use of anticoagulants: Secondary | ICD-10-CM | POA: Diagnosis not present

## 2017-09-15 DIAGNOSIS — I159 Secondary hypertension, unspecified: Secondary | ICD-10-CM | POA: Diagnosis not present

## 2017-09-15 DIAGNOSIS — Z79899 Other long term (current) drug therapy: Secondary | ICD-10-CM | POA: Insufficient documentation

## 2017-09-15 DIAGNOSIS — Z95810 Presence of automatic (implantable) cardiac defibrillator: Secondary | ICD-10-CM | POA: Diagnosis not present

## 2017-09-15 DIAGNOSIS — I5042 Chronic combined systolic (congestive) and diastolic (congestive) heart failure: Secondary | ICD-10-CM | POA: Insufficient documentation

## 2017-09-15 DIAGNOSIS — I480 Paroxysmal atrial fibrillation: Secondary | ICD-10-CM | POA: Diagnosis not present

## 2017-09-15 DIAGNOSIS — I428 Other cardiomyopathies: Secondary | ICD-10-CM | POA: Insufficient documentation

## 2017-09-15 DIAGNOSIS — I519 Heart disease, unspecified: Secondary | ICD-10-CM | POA: Diagnosis not present

## 2017-09-15 DIAGNOSIS — J449 Chronic obstructive pulmonary disease, unspecified: Secondary | ICD-10-CM | POA: Diagnosis not present

## 2017-09-15 DIAGNOSIS — G4733 Obstructive sleep apnea (adult) (pediatric): Secondary | ICD-10-CM | POA: Diagnosis not present

## 2017-09-15 LAB — BASIC METABOLIC PANEL
Anion gap: 12 (ref 5–15)
BUN: 42 mg/dL — ABNORMAL HIGH (ref 6–20)
CALCIUM: 9.5 mg/dL (ref 8.9–10.3)
CO2: 33 mmol/L — ABNORMAL HIGH (ref 22–32)
Chloride: 94 mmol/L — ABNORMAL LOW (ref 101–111)
Creatinine, Ser: 2.3 mg/dL — ABNORMAL HIGH (ref 0.61–1.24)
GFR, EST AFRICAN AMERICAN: 29 mL/min — AB (ref >60)
GFR, EST NON AFRICAN AMERICAN: 25 mL/min — AB (ref >60)
GLUCOSE: 134 mg/dL — AB (ref 65–99)
Potassium: 3.9 mmol/L (ref 3.5–5.1)
SODIUM: 139 mmol/L (ref 135–145)

## 2017-09-15 NOTE — Progress Notes (Signed)
Patient ID: Maurice Delgado, male   DOB: June 25, 1934, 84 y.o.   MRN: [id number]    Advanced Heart Failure Clinic Note   PCP: Dr. Jonelle Sidle HF Cardiology: Dr Aundra Dubin Nephrology: Dr Deterding  Maurice Delgado is a 84 y.o. male with history of chronic systolic CHF due to NICM, CHB s/p Medtronic CRT-D 2002 with generator change 2011, HTN, history of DVT on chronic anticoag with coumadin, COPD on home O2, PAF, CKD stage III.  He went into atrial fibrillation in 7/17 and had TEE-guided DCCV.    He was admitted in 1/19 with a syncopal episode while using the bathroom, possibly vasovagal syncope in the setting of gastroenteritis. He was noted to have AKI from dehydration.  Diuretic restarted prior to discharge. Echo in 1/19 showed EF up to 50-55%.   He presents today for regular follow up. At last visit torsemide increased and metolazone added once weekly. He did not take metolazone the first week. Weight stable by our scales, but down ~ 5 lbs at home. He remains SOB if he tries to move fast. Does OK when he takes his time. He remains very SOB with stairs. He fell getting out of the car last week. Wife says his shoe wasn't tied and he tried to use his cane instead of his walker. He says he "rolled" out and caught himself. Denies orthopnea or PND. He has occasional lightheadedness.   Medtronic device interrogated: Fluid level trending down since torsemide increased. Thoracic impedence trending towards dry. Pt activity very very low. No VT/VF/AF.  Labs (1/17): K 3.6, creatinine 1.56 Labs 07/20/2015: K 4.5 Creatinine 2.33, digoxin 0.5  Labs (2/17): K 4.3, creatinine 2.24 Labs (3/17) K, 4.4, Creatinine 1.73, Dig 0.5 Lab (10/12/2015): INR 2.4  Labs (10/17/2015): K 4.1 Creatinine 1.77 Labs (01/2016): dig level 0.5 K 4.2 Creatinine 1.44 Labs (10/17): K 3.8, creatinine 1.71 Labs (3/18): digoxin 0.3 Labs (4/18): K 4.3, creatinine 1.8 Labs (6/18): digoxin 0.7 Labs (9/18): K 4.1, creatinine 2.29 Labs (2/19): K 4.4,  creatinine 3.05, hgb 10.5  PMH: 1. Sciatica 2. H/o DVT: Recurrent.  Has IVC filter 3. Complete heart block: Medtronic CRT-D device placed 2002.  4. Atrial fibrillation: Paroxysmal. On sotalol to maintain NSR.  - TEE-guided DCCV in 7/17.  5. OSA: No longer uses CPAP.  6. Chronic systolic CHF: Echo (8/84) with EF 20-25%, mildly dilated LV, normal RV size and systolic function, mild MR.  Nonischemic cardiomyopathy.  Has MDT CRT-D device.  - Hypotension with low dose Bidil.  - TEE (7/17): EF 30%, diffuse hypokinesis, mildly dilated RV with mildly decreased systolic function.  7. COPD: On home oxygen.  8. History of retroperitoneal hemorrhage.  9. CKD: Stage IV.  10. Gout 11. GERD 12. Atrial tachycardia: s/p ablation 2014.  13. Sleep study negative 14. ABIs (2/18): Normal.   Social History   Socioeconomic History  . Marital status: Married    Spouse name: Not on file  . Number of children: Not on file  . Years of education: Not on file  . Highest education level: Not on file  Social Needs  . Financial resource strain: Not on file  . Food insecurity - worry: Not on file  . Food insecurity - inability: Not on file  . Transportation needs - medical: Not on file  . Transportation needs - non-medical: Not on file  Occupational History  . Occupation: reitred    Comment: from education  Tobacco Use  . Smoking status: Former Smoker    Packs/day: 1.50  Years: 30.00    Pack years: 45.00    Types: Cigarettes  . Smokeless tobacco: Never Used  . Tobacco comment: 06/21/2013 "quit smoking ~ 1986"  Substance and Sexual Activity  . Alcohol use: No    Comment: 06/21/2013 "quit drinking ~ 1970"  . Drug use: Yes    Types: Marijuana    Comment: 06/21/2013 "stopped using marijuana ~ 1970"  . Sexual activity: Not Currently  Other Topics Concern  . Not on file  Social History Narrative   Married and has 1 son.    Lives in [place].  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   Family History    Problem Relation Age of Onset  . Heart disease Mother        mother also had Rheumatism.  Marland Kitchen Hypertension Mother   . Heart attack Neg Hx   . Stroke Neg Hx    Review of systems complete and found to be negative unless listed in HPI.    Current Outpatient Medications  Medication Sig Dispense Refill  . Ascorbic Acid (VITAMIN C) 1000 MG tablet Take 1,000 mg by mouth every morning.     . calcitRIOL (ROCALTROL) 0.5 MCG capsule Take 0.5 mcg by mouth every other day.     . carvedilol (COREG) 6.25 MG tablet Take 1 tablet (6.25 mg total) by mouth 2 (two) times daily. 180 tablet 3  . Febuxostat (ULORIC) 80 MG TABS Take 80 mg by mouth every morning.     . metolazone (ZAROXOLYN) 2.5 MG tablet Take 1 tablet (2.5 mg total) by mouth once a week. Take every Tuesday morning with Torsemide with an extra Potassium 20 meq 6 tablet 3  . NON FORMULARY [facility]  Urea Cream - 40% Urea Apply 1-2 grams to affected area 3-4 times daily Qty. 120 gm 3 refills    . OXYGEN Inhale into the lungs. Is on Oxygen all day long; uses 2 Liters a day    . potassium chloride SA (K-DUR,KLOR-CON) 20 MEQ tablet Take 1 tablet (20 mEq total) by mouth once a week. Take 20 meq 1 tab) with metolazone 6 tablet 3  . pregabalin (LYRICA) 150 MG capsule Take 150 mg by mouth 2 (two) times daily.      . sotalol (BETAPACE) 160 MG tablet TAKE 1 TABLET BY MOUTH  DAILY 90 tablet 3  . spironolactone (ALDACTONE) 25 MG tablet TAKE ONE-HALF TABLET BY  MOUTH DAILY 45 tablet 3  . Tamsulosin HCl (FLOMAX) 0.4 MG CAPS Take 0.4 mg by mouth every morning.     . torsemide (DEMADEX) 20 MG tablet Take 4 tablets (80 mg total) by mouth 2 (two) times daily. 240 tablet 3  . umeclidinium-vilanterol (ANORO ELLIPTA) 62.5-25 MCG/INH AEPB TAKE 1 PUFF BY MOUTH EVERY DAY 180 each 1  . warfarin (COUMADIN) 5 MG tablet Take as directed by coumadin clinic (Patient taking differently: Take 5-7.5 mg by mouth See admin instructions. Take as directed by coumadin clinic.   Tuesday, Thursday,Saturday, Sunday 5mg  . Monday, Wednesday, Friday 7.5mg ) 150 tablet 1   No current facility-administered medications for this encounter.    Vitals:   09/15/17 1359  BP: 98/72  Pulse: 77  SpO2: 94%  Weight: (!) 300 lb 6.4 oz (136.3 kg)   Wt Readings from Last 3 Encounters:  09/15/17 (!) 300 lb 6.4 oz (136.3 kg)  09/01/17 (!) 300 lb 8 oz (136.3 kg)  08/04/17 296 lb 11.8 oz (134.6 kg)   Physical Exam General: Well appearing. No resp difficulty. HEENT:  Normal Neck: Supple. JVP difficult, but appears at least 8-9 cm. Carotids 2+ bilat; no bruits. No thyromegaly or nodule noted. Cor: PMI nondisplaced. RRR, No M/G/R noted Lungs: CTAB, normal effort. Abdomen: Soft, non-tender, non-distended, no HSM. No bruits or masses. +BS  Extremities: No cyanosis, clubbing, or rash. 1-2+ edema to thighs.  Neuro: Alert & orientedx3, cranial nerves grossly intact. moves all 4 extremities w/o difficulty. Affect pleasant    Assessment/Plan: 1. Atrial fibrillation: Paroxysmal. TEE-guided DCCV in 7/17. He is a-paced today. - Occasional very short runs of AF by device interrogation, but nothing sustained - Continue sotalol, QTc acceptable with paced rhythm. Allergic to amiodarone.  - Continue coumadin per coumadin clinic.  No change.  2. COPD: - Stable on home O2 2L via [place].  3. Chronic systolic => diastolic CHF: Nonischemic cardiomyopathy.  MDT CRT-D device. On most recent echo in 1/19, EF was improved to 50-55%.   - NYHA class IIIb symptoms, suspect mixed COPD, CHF, and deconditioning.  - Volume status improving by exam and optivol.  - Continue torsemide 80 mg BID. BMET today.  - Continue metolazone 2.5 mg every Tuesday. He will take KCl 20 on metolazone days.      - Has not tolerated Bidil due to symptomatic low BP.  - Stop digoxin with improved EF and CKD stage IV.       - No ACEI/ARB/ARNI with elevated creatinine. - Continue Coreg 6.25 mg bid, no room to increase with soft BP.    - Continue spironolactone 12.5 mg daily but will need to follow K closely.   - Referred to cardiac rehab.  - Continue unna boots.  - Reinforced fluid restriction to < 2 L daily, sodium restriction to less than 2000 mg daily, and the importance of daily weights.   4. CKD: Stage IV.   - BMET today.  5. Hx of DVT:  - Coumadin as above.   Labs as above. No increase in meds as he is due metolazone and mildly lightheaded. RTC 4 weeks. Volume status improving by exam and optivol, despite relatively stable weight.  Shirley Friar, PA-C  09/15/2017    Greater than 50% of the 25 minute visit was spent in counseling/coordination of care regarding disease state education, salt/fluid restriction, sliding scale diuretics, and medication compliance.

## 2017-09-15 NOTE — Patient Instructions (Signed)
No changes to medication at this time.  Routine lab work today. Will notify you of abnormal results, otherwise no news is good news!  Follow up 4 weeks with Oda Kilts PA-C.  __________________________________________________________ Maurice Delgado Code: 1100  Take all medication as prescribed the day of your appointment. Bring all medications with you to your appointment.  Do the following things EVERYDAY: 1) Weigh yourself in the morning before breakfast. Write it down and keep it in a log. 2) Take your medicines as prescribed 3) Eat low salt foods-Limit salt (sodium) to 2000 mg per day.  4) Stay as active as you can everyday 5) Limit all fluids for the day to less than 2 liters

## 2017-09-16 ENCOUNTER — Ambulatory Visit (INDEPENDENT_AMBULATORY_CARE_PROVIDER_SITE_OTHER): Payer: Medicare HMO | Admitting: Internal Medicine

## 2017-09-16 DIAGNOSIS — Z5181 Encounter for therapeutic drug level monitoring: Secondary | ICD-10-CM | POA: Diagnosis not present

## 2017-09-16 DIAGNOSIS — I480 Paroxysmal atrial fibrillation: Secondary | ICD-10-CM

## 2017-09-16 LAB — CUP PACEART REMOTE DEVICE CHECK
Battery Remaining Longevity: 40 mo
Battery Voltage: 2.97 V
Brady Statistic AP VS Percent: 0.17 %
Brady Statistic AS VS Percent: 0.16 %
Brady Statistic RA Percent Paced: 85.13 %
Brady Statistic RV Percent Paced: 97.82 %
HIGH POWER IMPEDANCE MEASURED VALUE: 63 Ohm
HighPow Impedance: 48 Ohm
Implantable Lead Implant Date: 20020325
Implantable Lead Implant Date: 20020325
Implantable Lead Location: 753859
Implantable Lead Model: 6945
Implantable Pulse Generator Implant Date: 20160301
Lead Channel Impedance Value: 418 Ohm
Lead Channel Impedance Value: 456 Ohm
Lead Channel Pacing Threshold Amplitude: 1.25 V
Lead Channel Pacing Threshold Pulse Width: 0.4 ms
Lead Channel Sensing Intrinsic Amplitude: 14.25 mV
Lead Channel Sensing Intrinsic Amplitude: 14.25 mV
Lead Channel Sensing Intrinsic Amplitude: 2.25 mV
Lead Channel Setting Pacing Amplitude: 1.5 V
Lead Channel Setting Pacing Pulse Width: 0.5 ms
MDC IDC LEAD IMPLANT DT: 20020325
MDC IDC LEAD LOCATION: 753858
MDC IDC LEAD LOCATION: 753860
MDC IDC MSMT LEADCHNL LV IMPEDANCE VALUE: 418 Ohm
MDC IDC MSMT LEADCHNL LV IMPEDANCE VALUE: 665 Ohm
MDC IDC MSMT LEADCHNL RA PACING THRESHOLD AMPLITUDE: 1.125 V
MDC IDC MSMT LEADCHNL RA PACING THRESHOLD PULSEWIDTH: 0.4 ms
MDC IDC MSMT LEADCHNL RA SENSING INTR AMPL: 2.25 mV
MDC IDC MSMT LEADCHNL RV IMPEDANCE VALUE: 266 Ohm
MDC IDC MSMT LEADCHNL RV IMPEDANCE VALUE: 304 Ohm
MDC IDC SESS DTM: 20190228213205
MDC IDC SET LEADCHNL RA PACING AMPLITUDE: 2.25 V
MDC IDC SET LEADCHNL RV PACING AMPLITUDE: 2.75 V
MDC IDC SET LEADCHNL RV PACING PULSEWIDTH: 0.4 ms
MDC IDC SET LEADCHNL RV SENSING SENSITIVITY: 0.3 mV
MDC IDC STAT BRADY AP VP PERCENT: 87.19 %
MDC IDC STAT BRADY AS VP PERCENT: 12.48 %

## 2017-09-16 LAB — POCT INR: INR: 2.7

## 2017-09-18 ENCOUNTER — Other Ambulatory Visit (HOSPITAL_COMMUNITY): Payer: Self-pay | Admitting: *Deleted

## 2017-09-18 MED ORDER — SPIRONOLACTONE 25 MG PO TABS: 12.5000 mg | ORAL_TABLET | Freq: Every day | ORAL | 0 refills | Status: DC

## 2017-09-18 MED ORDER — SPIRONOLACTONE 25 MG PO TABS: 12.5000 mg | ORAL_TABLET | Freq: Every day | ORAL | 3 refills | Status: DC

## 2017-09-18 NOTE — Addendum Note (Signed)
Addended by: Kerry Dory on: 09/18/2017 03:55 PM   Modules accepted: Orders

## 2017-09-19 ENCOUNTER — Other Ambulatory Visit (HOSPITAL_COMMUNITY): Payer: Self-pay | Admitting: Cardiology

## 2017-09-22 ENCOUNTER — Other Ambulatory Visit (HOSPITAL_COMMUNITY): Payer: Self-pay | Admitting: *Deleted

## 2017-09-22 MED ORDER — SPIRONOLACTONE 25 MG PO TABS: 12.5000 mg | ORAL_TABLET | Freq: Every day | ORAL | 3 refills | Status: AC

## 2017-09-30 ENCOUNTER — Ambulatory Visit (INDEPENDENT_AMBULATORY_CARE_PROVIDER_SITE_OTHER): Payer: Medicare HMO | Admitting: Cardiology

## 2017-09-30 DIAGNOSIS — Z5181 Encounter for therapeutic drug level monitoring: Secondary | ICD-10-CM

## 2017-09-30 DIAGNOSIS — I480 Paroxysmal atrial fibrillation: Secondary | ICD-10-CM | POA: Diagnosis not present

## 2017-09-30 LAB — POCT INR: INR: 2.6

## 2017-10-07 ENCOUNTER — Ambulatory Visit (INDEPENDENT_AMBULATORY_CARE_PROVIDER_SITE_OTHER): Payer: Medicare HMO | Admitting: Cardiology

## 2017-10-07 DIAGNOSIS — Z86718 Personal history of other venous thrombosis and embolism: Secondary | ICD-10-CM | POA: Diagnosis not present

## 2017-10-07 DIAGNOSIS — Z5181 Encounter for therapeutic drug level monitoring: Secondary | ICD-10-CM | POA: Diagnosis not present

## 2017-10-07 DIAGNOSIS — I480 Paroxysmal atrial fibrillation: Secondary | ICD-10-CM | POA: Diagnosis not present

## 2017-10-07 LAB — POCT INR: INR: 3.1

## 2017-10-07 NOTE — Patient Instructions (Signed)
Description   Spoke with Sonja nurse with [facility] while in the home with the pt and instructed to have pt hold coumadin today April 9th then continue taking 1 tablet daily every day except 1.5 tablets on Mondays, Wednesdays, and Fridays.  Keep dark green leafy veggies consistent weekly.  Recheck 2 weeks- HH will keep pt on plan of care for next 9 weeks.  Call us with any new medications [phone].

## 2017-10-13 NOTE — Progress Notes (Signed)
Patient ID: Maurice Delgado, male   DOB: 1933-07-19, 84 y.o.   MRN: [id number]    Advanced Heart Failure Clinic Note   PCP: Dr. Jonelle Sidle HF Cardiology: Dr Aundra Dubin Nephrology: Dr Deterding  Maurice Delgado is a 84 y.o. male with history of chronic systolic CHF due to NICM, CHB s/p [employer] CRT-D 2002 with generator change 2011, HTN, history of DVT on chronic anticoag with coumadin, COPD on home O2, PAF, CKD stage III.  He went into atrial fibrillation in 7/17 and had TEE-guided DCCV.    He was admitted in 1/19 with a syncopal episode while using the bathroom, possibly vasovagal syncope in the setting of gastroenteritis. He was noted to have AKI from dehydration.  Diuretic restarted prior to discharge. Echo in 1/19 showed EF up to 50-55%.   He presents today for regular follow up. No changes last visit with soft blood pressure. Weight up 8 lbs from last visit. He states he is out of metolazone. He thinks they only gave him 3 at the pharmacy, so he didn't have enough to continue long term. Not very active at all.  PT no longer coming out to his house.  He is SOB with mild exertion. He is SOB with changing clothes. He has been encouraged to turn his O2 up with exertion, but never remembers. Getting unna boots replaced weekly.  He is taking all medications as directed. He is not sure what mix up happened with his metolazone, but he is out.  He has lightheadedness with rapid standing.   [employer] device interrogated: Thoracic impedence depressed since ~ Apr 9. Fluid index trending up. Pt activity nearly null.  No VT/VF. No AF.   Labs (1/17): K 3.6, creatinine 1.56 Labs 07/20/2015: K 4.5 Creatinine 2.33, digoxin 0.5  Labs (2/17): K 4.3, creatinine 2.24 Labs (3/17) K, 4.4, Creatinine 1.73, Dig 0.5 Lab (10/12/2015): INR 2.4  Labs (10/17/2015): K 4.1 Creatinine 1.77 Labs (01/2016): dig level 0.5 K 4.2 Creatinine 1.44 Labs (10/17): K 3.8, creatinine 1.71 Labs (3/18): digoxin 0.3 Labs (4/18): K 4.3,  creatinine 1.8 Labs (6/18): digoxin 0.7 Labs (9/18): K 4.1, creatinine 2.29 Labs (2/19): K 4.4, creatinine 3.05, hgb 10.5  PMH: 1. Sciatica 2. H/o DVT: Recurrent.  Has IVC filter 3. Complete heart block: Medtronic CRT-D device placed 2002.  4. Atrial fibrillation: Paroxysmal. On sotalol to maintain NSR.  - TEE-guided DCCV in 7/17.  5. OSA: No longer uses CPAP.  6. Chronic systolic CHF: Echo (5/36) with EF 20-25%, mildly dilated LV, normal RV size and systolic function, mild MR.  Nonischemic cardiomyopathy.  Has MDT CRT-D device.  - Hypotension with low dose Bidil.  - TEE (7/17): EF 30%, diffuse hypokinesis, mildly dilated RV with mildly decreased systolic function.  7. COPD: On home oxygen.  8. History of retroperitoneal hemorrhage.  9. CKD: Stage IV.  10. Gout 11. GERD 12. Atrial tachycardia: s/p ablation 2014.  13. Sleep study negative 14. ABIs (2/18): Normal.   Social History   Socioeconomic History  . Marital status: Married    Spouse name: Not on file  . Number of children: Not on file  . Years of education: Not on file  . Highest education level: Not on file  Occupational History  . Occupation: reitred    Comment: from education  Social Needs  . Financial resource strain: Not on file  . Food insecurity:    Worry: Not on file    Inability: Not on file  . Transportation needs:  Medical: Not on file    Non-medical: Not on file  Tobacco Use  . Smoking status: Former Smoker    Packs/day: 1.50    Years: 30.00    Pack years: 45.00    Types: Cigarettes  . Smokeless tobacco: Never Used  . Tobacco comment: 06/21/2013 "quit smoking ~ 1986"  Substance and Sexual Activity  . Alcohol use: No    Comment: 06/21/2013 "quit drinking ~ 1970"  . Drug use: Yes    Types: Marijuana    Comment: 06/21/2013 "stopped using marijuana ~ 1970"  . Sexual activity: Not Currently  Lifestyle  . Physical activity:    Days per week: Not on file    Minutes per session: Not on file  .  Stress: Not on file  Relationships  . Social connections:    Talks on phone: Not on file    Gets together: Not on file    Attends religious service: Not on file    Active member of club or organization: Not on file    Attends meetings of clubs or organizations: Not on file    Relationship status: Not on file  . Intimate partner violence:    Fear of current or ex partner: Not on file    Emotionally abused: Not on file    Physically abused: Not on file    Forced sexual activity: Not on file  Other Topics Concern  . Not on file  Social History Narrative   Married and has 1 son.    Lives in [place].  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   Family History  Problem Relation Age of Onset  . Heart disease Mother        mother also had Rheumatism.  Marland Kitchen Hypertension Mother   . Heart attack Neg Hx   . Stroke Neg Hx    Review of systems complete and found to be negative unless listed in HPI.    Current Outpatient Medications  Medication Sig Dispense Refill  . Ascorbic Acid (VITAMIN C) 1000 MG tablet Take 1,000 mg by mouth every morning.     . calcitRIOL (ROCALTROL) 0.5 MCG capsule Take 0.5 mcg by mouth every other day.     . carvedilol (COREG) 6.25 MG tablet Take 1 tablet (6.25 mg total) by mouth 2 (two) times daily. 180 tablet 3  . Febuxostat (ULORIC) 80 MG TABS Take 80 mg by mouth every morning.     . metolazone (ZAROXOLYN) 2.5 MG tablet Take 1 tablet (2.5 mg total) by mouth once a week. Take every Tuesday morning with Torsemide with an extra Potassium 20 meq 6 tablet 3  . NON FORMULARY [facility]  Urea Cream - 40% Urea Apply 1-2 grams to affected area 3-4 times daily Qty. 120 gm 3 refills    . OXYGEN Inhale into the lungs. Is on Oxygen all day long; uses 2 Liters a day    . potassium chloride SA (K-DUR,KLOR-CON) 20 MEQ tablet Take 1 tablet (20 mEq total) by mouth once a week. Take 20 meq 1 tab) with metolazone 6 tablet 3  . pregabalin (LYRICA) 150 MG capsule Take 150 mg by mouth 2  (two) times daily.      . sotalol (BETAPACE) 160 MG tablet TAKE 1 TABLET BY MOUTH  DAILY 90 tablet 3  . spironolactone (ALDACTONE) 25 MG tablet Take 0.5 tablets (12.5 mg total) by mouth daily. 45 tablet 3  . Tamsulosin HCl (FLOMAX) 0.4 MG CAPS Take 0.4 mg by mouth every morning.     Marland Kitchen  torsemide (DEMADEX) 20 MG tablet Take 4 tablets (80 mg total) by mouth 2 (two) times daily. 240 tablet 3  . umeclidinium-vilanterol (ANORO ELLIPTA) 62.5-25 MCG/INH AEPB TAKE 1 PUFF BY MOUTH EVERY DAY 180 each 1  . warfarin (COUMADIN) 5 MG tablet Take as directed by coumadin clinic (Patient taking differently: Take 5-7.5 mg by mouth See admin instructions. Take as directed by coumadin clinic.  Tuesday, Thursday,Saturday, Sunday 5mg  . Monday, Wednesday, Friday 7.5mg ) 150 tablet 1   No current facility-administered medications for this visit.    Vitals:   10/14/17 1441  BP: 116/74  Pulse: 89  SpO2: 90%  Weight: (!) 308 lb 6.4 oz (139.9 kg)     Wt Readings from Last 3 Encounters:  10/14/17 (!) 308 lb 6.4 oz (139.9 kg)  09/15/17 (!) 300 lb 6.4 oz (136.3 kg)  09/01/17 (!) 300 lb 8 oz (136.3 kg)   Physical Exam General: Well appearing. No resp difficulty. HEENT: Normal Neck: Supple. JVP ~9-10 cm. Carotids 2+ bilat; no bruits. No thyromegaly or nodule noted. Cor: PMI nondisplaced. RRR, No M/G/R noted Lungs: CTAB, normal effort. Abdomen: Soft, non-tender, non-distended, no HSM. No bruits or masses. +BS  Extremities: No cyanosis, clubbing, or rash. Chronic venous stasis with unna boots and 2+ edema. Neuro: Alert & orientedx3, cranial nerves grossly intact. moves all 4 extremities w/o difficulty. Affect pleasant    Assessment/Plan: 1. Atrial fibrillation: Paroxysmal. TEE-guided DCCV in 7/17. He is a-paced today. - No AFib on ICD interrogation. Regular on exam.  - Continue sotalol, QTc acceptable with paced rhythm. Allergic to amiodarone.  - Continue coumadin per coumadin clinic.  No change.  2. COPD: -  Stable on home O2 at 2L via [place].  3. Chronic systolic => diastolic CHF: Nonischemic cardiomyopathy.  MDT CRT-D device. On most recent echo in 1/19, EF was improved to 50-55%.   - NYHA class III-IIIb. -> Suspect mixed COPD, CHF, and deconditioning.  - Volume status elevated on exam.  - Continue torsemide 80 mg BID. BMET today.  - Has been out of metolazone. Resume metolazone 2.5 mg every Tuesday. (OK to take tomorrow if he can't get tonight) He will take KCl 20 on metolazone days.      - Has not tolerated Bidil due to symptomatic low BP.  - Now off digoxin with improved EF and CKD stage IV.       - No ACEI/ARB/ARNI with elevated creatinine. - Continue Coreg 6.25 mg bid, no room to increase with soft BP.   - Continue spironolactone 12.5 mg daily but will need to follow K closely.   - Referred to cardiac rehab.  - Continue unna boots.  - Reinforced fluid restriction to < 2 L daily, sodium restriction to less than 2000 mg daily, and the importance of daily weights.   4. CKD: Stage IV.   - BMET today.  5. Hx of DVT:  - Coumadin as above.  6. Deconditioning - Will have [facility] resume PT if possible.   Meds and labs as above. RTC 4 weeks.  Shirley Friar, PA-C  10/13/2017    Greater than 50% of the 25 minute visit was spent in counseling/coordination of care regarding disease state education, salt/fluid restriction, sliding scale diuretics, and medication compliance.

## 2017-10-14 ENCOUNTER — Encounter: Payer: Self-pay | Admitting: Cardiology

## 2017-10-14 ENCOUNTER — Ambulatory Visit (HOSPITAL_COMMUNITY)
Admission: RE | Admit: 2017-10-14 | Discharge: 2017-10-14 | Disposition: A | Discharge status: Home / Self Care | Payer: Medicare HMO | Source: Ambulatory Visit | Attending: Cardiology | Admitting: Cardiology

## 2017-10-14 ENCOUNTER — Other Ambulatory Visit (HOSPITAL_COMMUNITY): Payer: Self-pay | Admitting: Cardiology

## 2017-10-14 ENCOUNTER — Encounter (HOSPITAL_COMMUNITY): Payer: Self-pay

## 2017-10-14 VITALS — BP 116/74 | HR 89 | Wt 308.4 lb

## 2017-10-14 DIAGNOSIS — Z87891 Personal history of nicotine dependence: Secondary | ICD-10-CM | POA: Diagnosis not present

## 2017-10-14 DIAGNOSIS — N184 Chronic kidney disease, stage 4 (severe): Secondary | ICD-10-CM | POA: Diagnosis not present

## 2017-10-14 DIAGNOSIS — I429 Cardiomyopathy, unspecified: Secondary | ICD-10-CM | POA: Insufficient documentation

## 2017-10-14 DIAGNOSIS — I5022 Chronic systolic (congestive) heart failure: Secondary | ICD-10-CM | POA: Insufficient documentation

## 2017-10-14 DIAGNOSIS — I872 Venous insufficiency (chronic) (peripheral): Secondary | ICD-10-CM | POA: Diagnosis not present

## 2017-10-14 DIAGNOSIS — I442 Atrioventricular block, complete: Secondary | ICD-10-CM | POA: Diagnosis not present

## 2017-10-14 DIAGNOSIS — N183 Chronic kidney disease, stage 3 unspecified: Secondary | ICD-10-CM

## 2017-10-14 DIAGNOSIS — I519 Heart disease, unspecified: Secondary | ICD-10-CM | POA: Diagnosis not present

## 2017-10-14 DIAGNOSIS — I480 Paroxysmal atrial fibrillation: Secondary | ICD-10-CM | POA: Insufficient documentation

## 2017-10-14 DIAGNOSIS — M109 Gout, unspecified: Secondary | ICD-10-CM | POA: Insufficient documentation

## 2017-10-14 DIAGNOSIS — Z9981 Dependence on supplemental oxygen: Secondary | ICD-10-CM | POA: Diagnosis not present

## 2017-10-14 DIAGNOSIS — Z7901 Long term (current) use of anticoagulants: Secondary | ICD-10-CM | POA: Insufficient documentation

## 2017-10-14 DIAGNOSIS — K219 Gastro-esophageal reflux disease without esophagitis: Secondary | ICD-10-CM | POA: Insufficient documentation

## 2017-10-14 DIAGNOSIS — Z79899 Other long term (current) drug therapy: Secondary | ICD-10-CM | POA: Insufficient documentation

## 2017-10-14 DIAGNOSIS — J449 Chronic obstructive pulmonary disease, unspecified: Secondary | ICD-10-CM

## 2017-10-14 DIAGNOSIS — Z9889 Other specified postprocedural states: Secondary | ICD-10-CM | POA: Insufficient documentation

## 2017-10-14 DIAGNOSIS — G4733 Obstructive sleep apnea (adult) (pediatric): Secondary | ICD-10-CM | POA: Diagnosis not present

## 2017-10-14 DIAGNOSIS — Z86718 Personal history of other venous thrombosis and embolism: Secondary | ICD-10-CM | POA: Insufficient documentation

## 2017-10-14 DIAGNOSIS — I130 Hypertensive heart and chronic kidney disease with heart failure and stage 1 through stage 4 chronic kidney disease, or unspecified chronic kidney disease: Secondary | ICD-10-CM | POA: Insufficient documentation

## 2017-10-14 DIAGNOSIS — I959 Hypotension, unspecified: Secondary | ICD-10-CM | POA: Insufficient documentation

## 2017-10-14 DIAGNOSIS — I159 Secondary hypertension, unspecified: Secondary | ICD-10-CM | POA: Diagnosis not present

## 2017-10-14 LAB — BASIC METABOLIC PANEL
ANION GAP: 10 (ref 5–15)
BUN: 48 mg/dL — ABNORMAL HIGH (ref 6–20)
CO2: 32 mmol/L (ref 22–32)
Calcium: 9 mg/dL (ref 8.9–10.3)
Chloride: 99 mmol/L — ABNORMAL LOW (ref 101–111)
Creatinine, Ser: 2.09 mg/dL — ABNORMAL HIGH (ref 0.61–1.24)
GFR calc Af Amer: 32 mL/min — ABNORMAL LOW (ref >60)
GFR calc non Af Amer: 28 mL/min — ABNORMAL LOW (ref >60)
Glucose, Bld: 137 mg/dL — ABNORMAL HIGH (ref 65–99)
POTASSIUM: 3.9 mmol/L (ref 3.5–5.1)
SODIUM: 141 mmol/L (ref 135–145)

## 2017-10-14 NOTE — Patient Instructions (Signed)
Labs today (will call for abnormal results, otherwise no news is good news)  Home Health PT has been ordered for you, they will contact you to schedule initial visit.  Please pick up metolazone and take it today.    Follow up as scheduled in 4 weeks.

## 2017-10-15 ENCOUNTER — Telehealth (HOSPITAL_COMMUNITY): Payer: Self-pay | Admitting: *Deleted

## 2017-10-15 NOTE — Telephone Encounter (Signed)
Leg wraps are more effective and he should continue for the time being if he can tolerate.   If he cannot tolerate, it is OK to switch. Please just stress that wraps are more effective.   Legrand Como [address] [place], PA-C 10/15/2017 4:06 PM

## 2017-10-15 NOTE — Telephone Encounter (Signed)
Advanced Heart Failure Triage Encounter  Patient Name: Maurice Delgado  Date of Call: 10/15/17  Problem:  Patient is asking if he can switch from the leg wraps to compression socks.   Plan:  Patient seen in clinic yesterday, will forward to Glennie Hawk for review and will call patient back with his recommendations.   Darron Doom, RN

## 2017-10-16 ENCOUNTER — Telehealth: Payer: Self-pay | Admitting: Physician Assistant

## 2017-10-16 ENCOUNTER — Telehealth: Payer: Self-pay

## 2017-10-16 NOTE — Telephone Encounter (Signed)
Received home monitor alert of 1 VF episode and 18 VHR. Called and spoke with patients wife who stated that the paramedics were on scene. Spoke with paramedic who reported that patient is in cardiac arrest with resuscitation in progress.

## 2017-10-16 NOTE — Telephone Encounter (Signed)
Paged by Jenny Reichmann of [employer] [phone] regarding RV lead integrity issue, and sensing noise. There appears to be some asystole as well. Based on phone note from earlier, it appears patient went into cardiac arrest. EMS already on the scene doing resuscitation. I talked with our EP NP Amber, it appears the lead was fractured and device saw noise and patient was shocked.   Hilbert Corrigan PA Pager: [phone]

## 2017-10-24 ENCOUNTER — Other Ambulatory Visit: Payer: Self-pay | Admitting: Cardiology

## 2017-10-27 ENCOUNTER — Ambulatory Visit: Payer: Self-pay | Admitting: Internal Medicine

## 2017-10-27 DIAGNOSIS — I480 Paroxysmal atrial fibrillation: Secondary | ICD-10-CM

## 2017-10-27 DIAGNOSIS — Z5181 Encounter for therapeutic drug level monitoring: Secondary | ICD-10-CM

## 2017-10-29 DEATH — deceased

## 2017-11-11 ENCOUNTER — Encounter (HOSPITAL_COMMUNITY): Payer: Medicare HMO

## 2017-11-25 ENCOUNTER — Ambulatory Visit: Payer: Medicare HMO | Admitting: Pulmonary Disease

## 2018-12-21 IMAGING — CR DG CHEST 1V PORT
2 series · 2 of 2 positions shown · non-contrast
Comparison: 07/29/2017

CLINICAL DATA: Endotracheal placement.

EXAM:
PORTABLE CHEST 1 VIEW

[AP (1 of 2)]
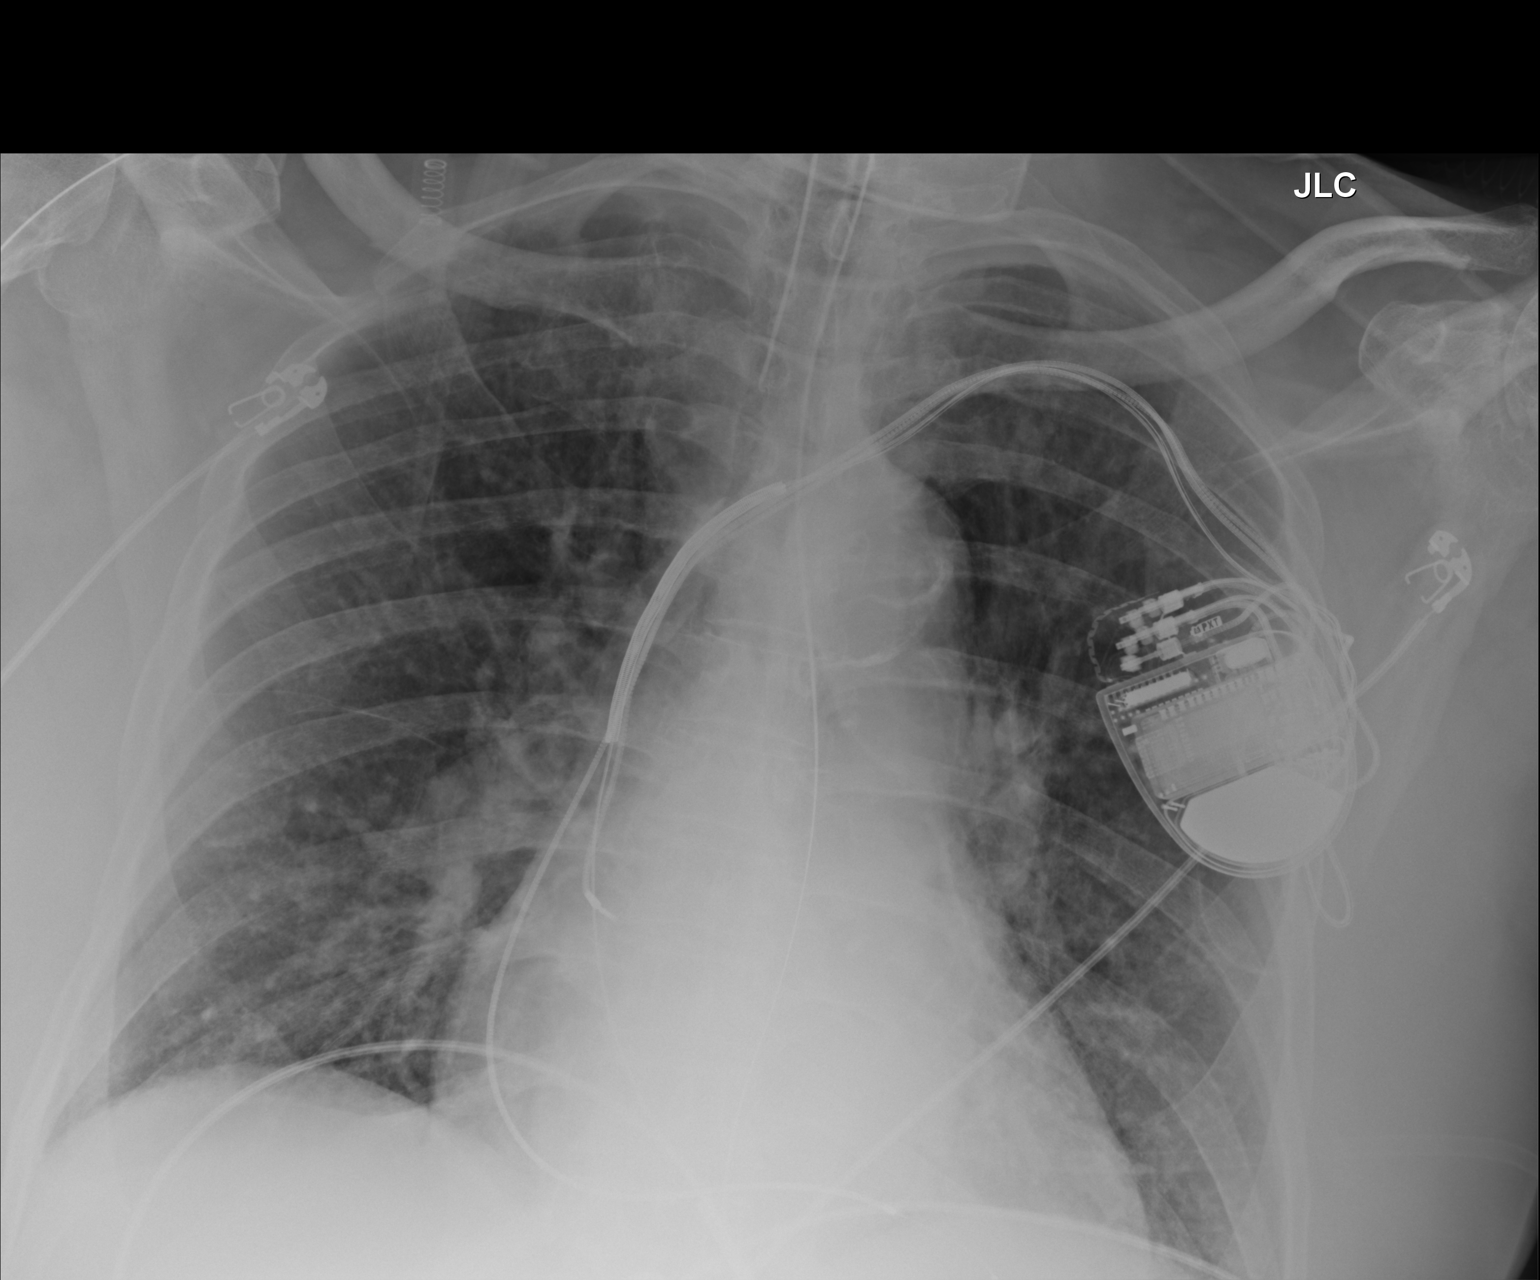

[AP (2 of 2)]
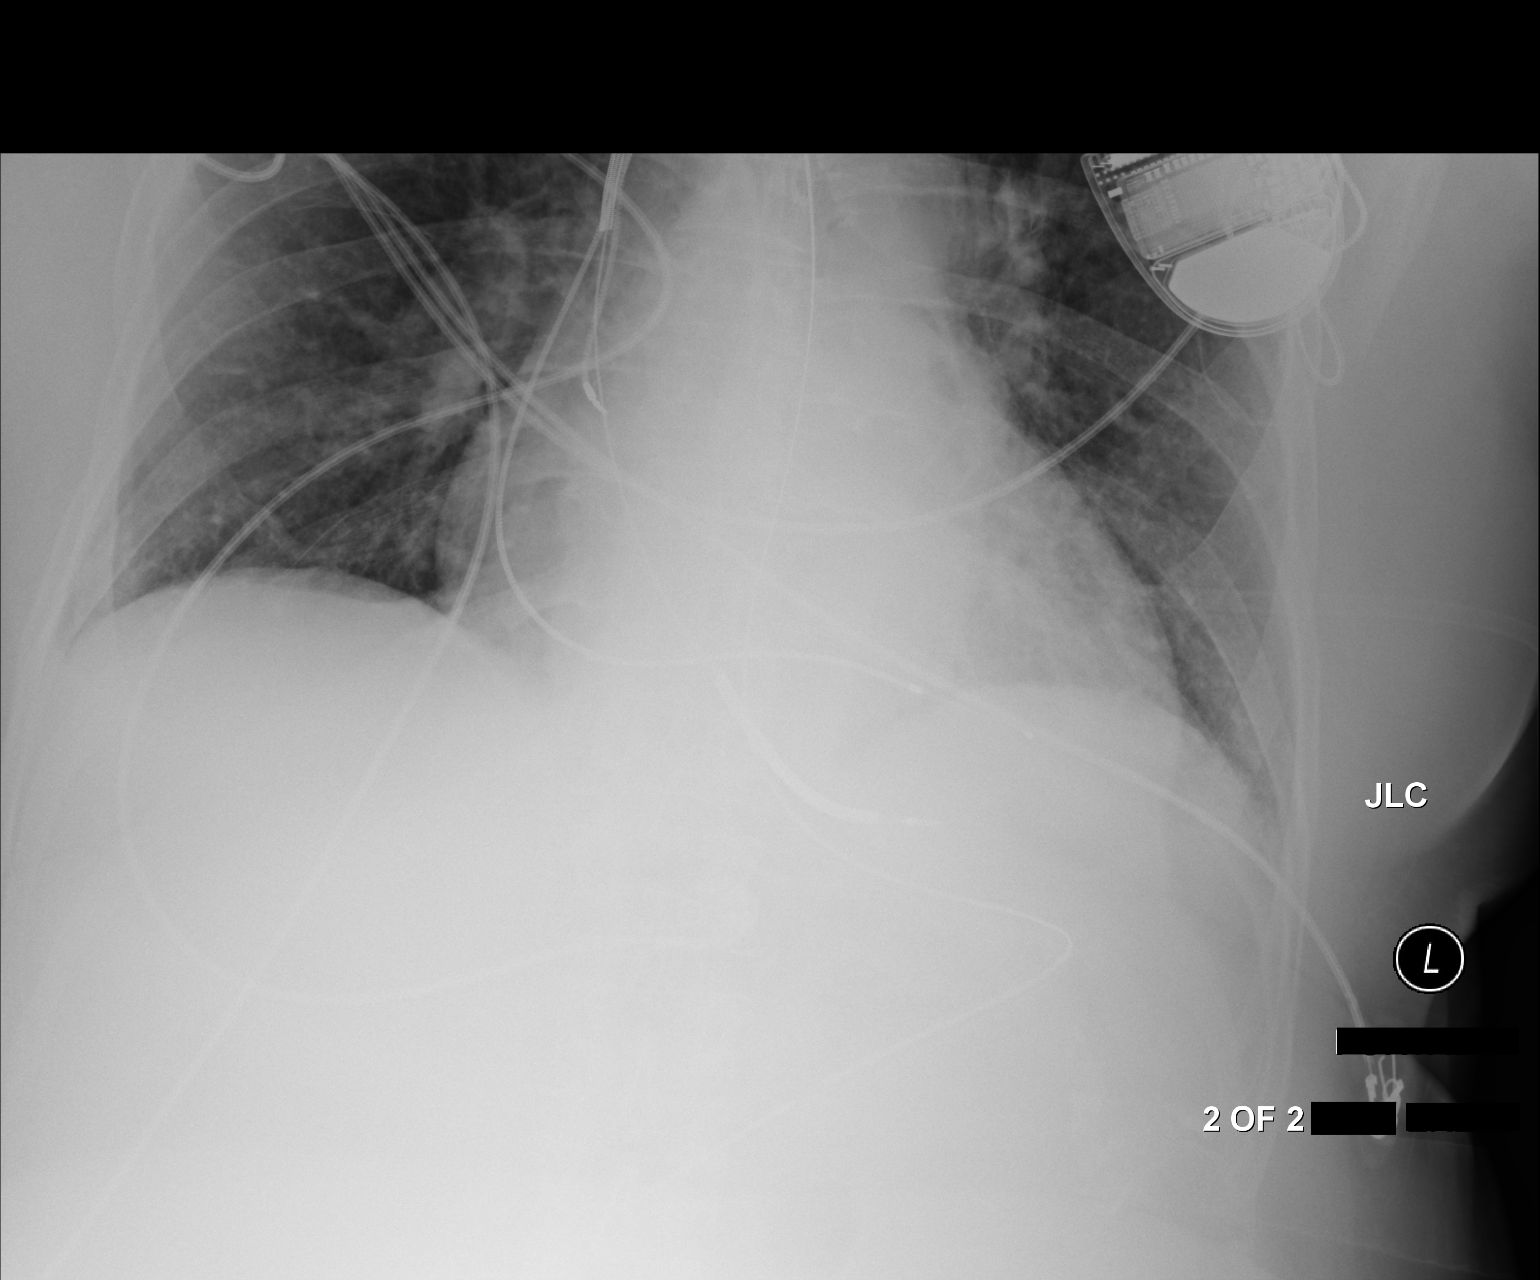

[2 of 2 positions shown; findings below may reference images not displayed]

FINDINGS: Endotracheal tube tip is 6 cm above the carina. Pacemaker/AICD
remains in place. Nasogastric tube enters the abdomen. There is
venous hypertension without frank edema. Heart size upper limits of
normal. Aortic atherosclerosis. No infiltrate, collapse or effusion.
IMPRESSION: Endotracheal tube and nasogastric tube well positioned. Venous
hypertension without frank edema, infiltrate or collapse.

## 2018-12-21 IMAGING — CT CT HEAD W/O CM
4 series · 16 of 47 positions shown, 18 images · non-contrast
Comparison: 1 day prior

CLINICAL DATA: altered level of consciousness. Fall yesterday. On
Coumadin.

EXAM:
CT HEAD WITHOUT CONTRAST
TECHNIQUE: Contiguous axial images were obtained from the base of the skull
through the vertex without intravenous contrast.

[Series 3: head without · axial · non-contrast · 0.46mm/px · z∈[-74,+46]mm · 7 of 33 slices shown, 9 images]
[im 5/33  brain]
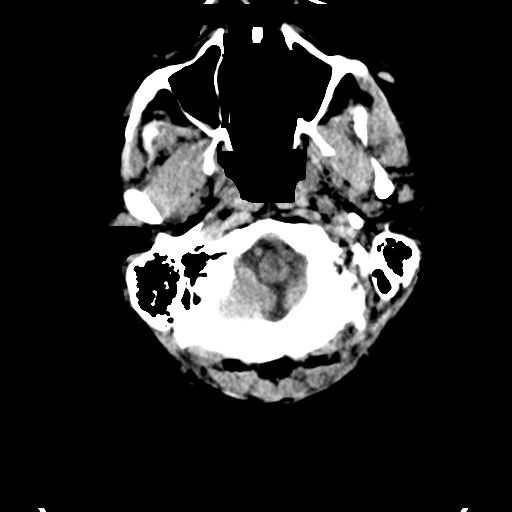
[im 5/33  bone]
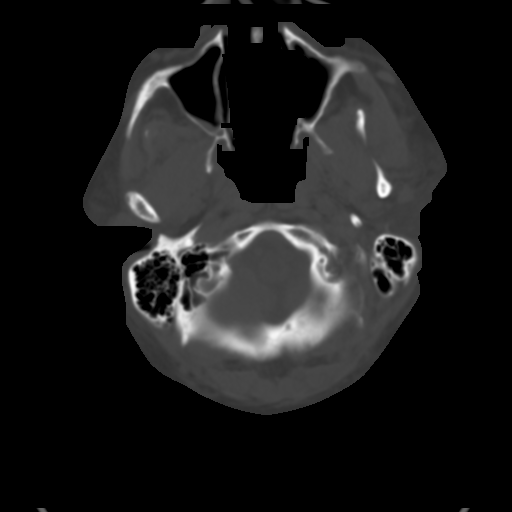
[im 9/33  brain]
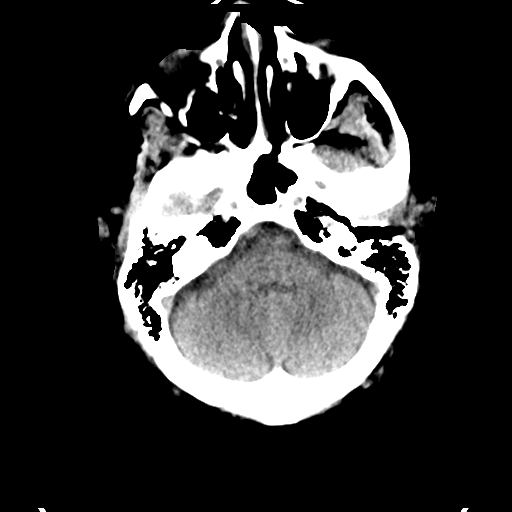
[im 13/33  brain]
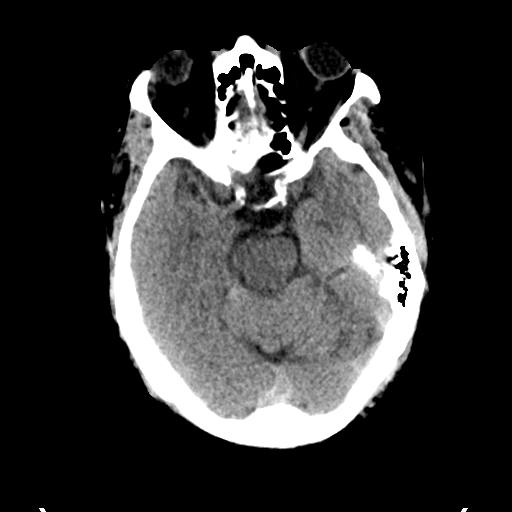
[im 17/33  brain]
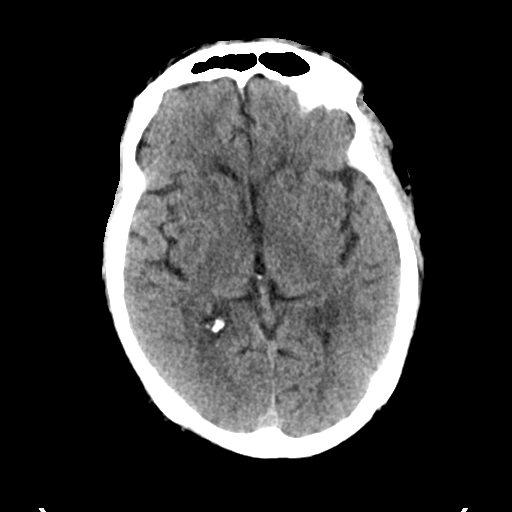
[im 21/33  brain]
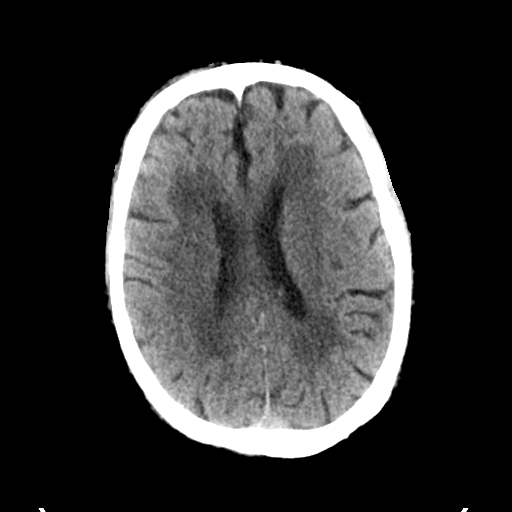
[im 21/33  bone]
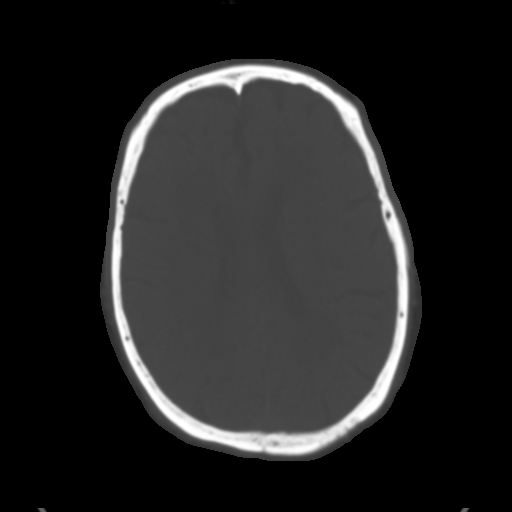
[im 25/33  brain]
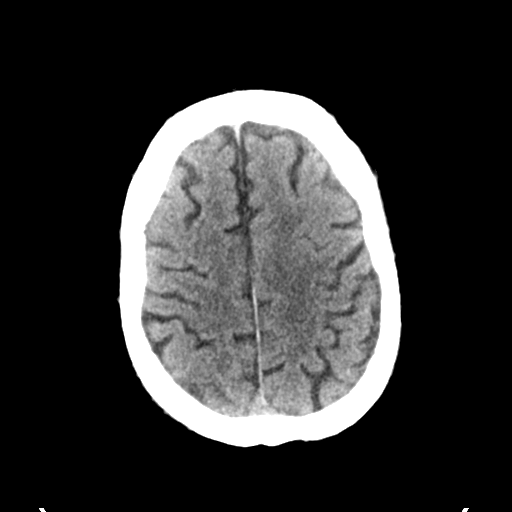
[im 29/33  brain]
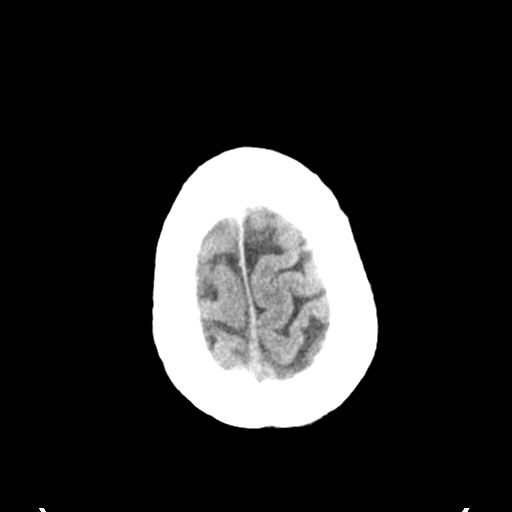

[Series 4: head bone · axial · 0.46mm/px · z∈[-78,-46]mm · 3 of 83 slices shown]
[im 9/83  bone]
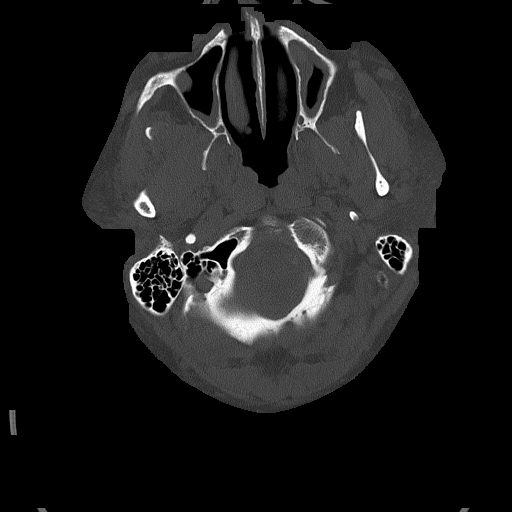
[im 17/83  bone]
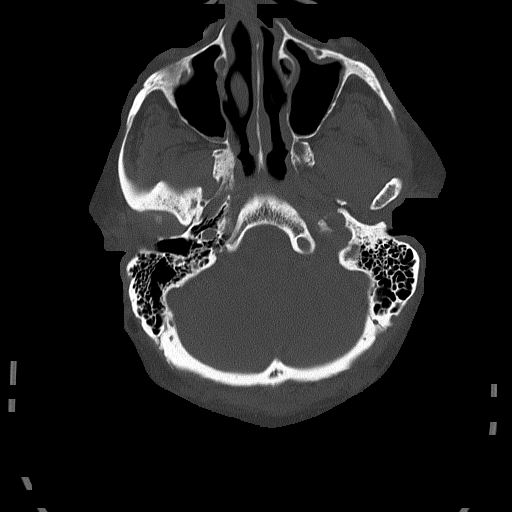
[im 25/83  bone]
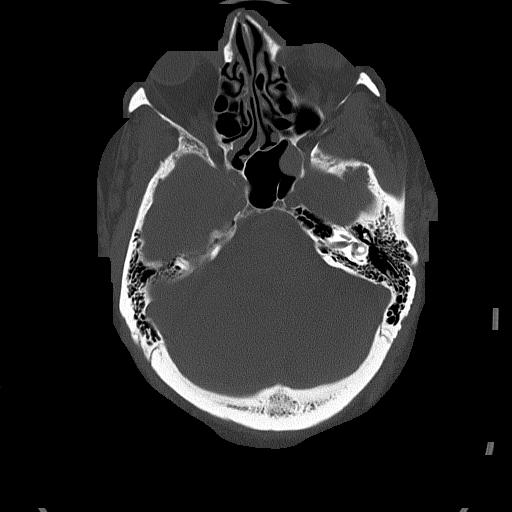

[Series 5: head without cor · coronal · non-contrast · 0.31mm/px · 3 of 69 slices shown]
[im 23/69  brain]
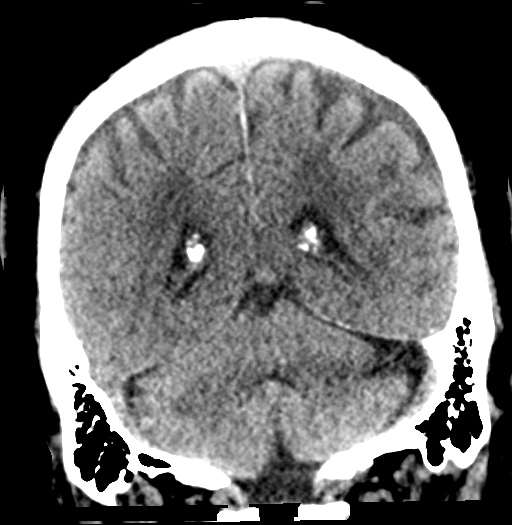
[im 31/69  brain]
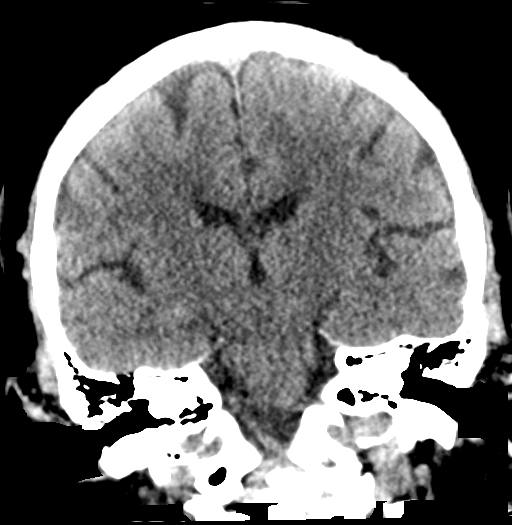
[im 38/69  brain]
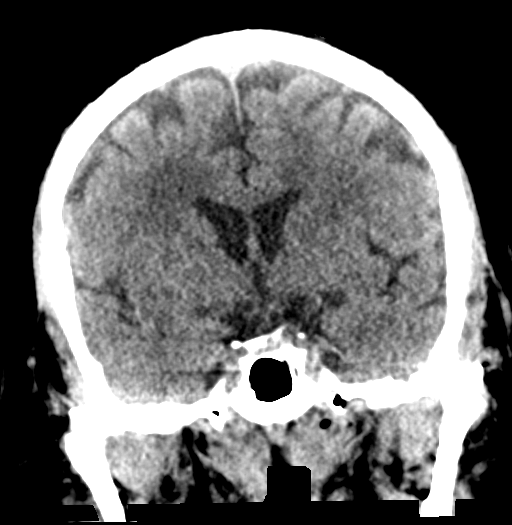

[Series 6: head without sag · sagittal · non-contrast · 0.32mm/px · 3 of 48 slices shown]
[im 16/48  brain]
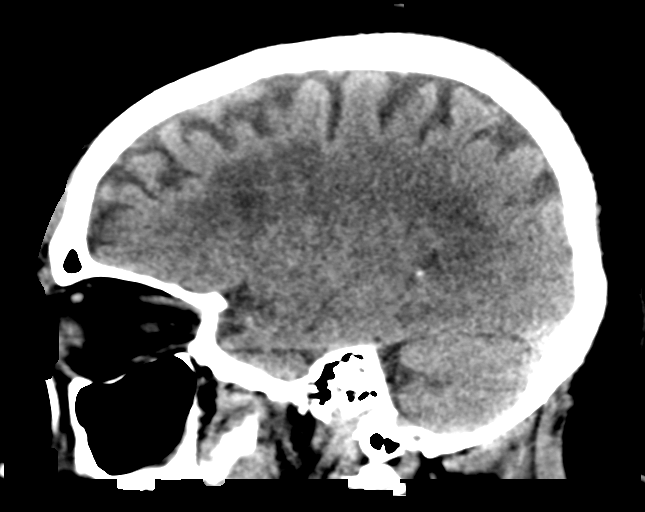
[im 24/48  brain]
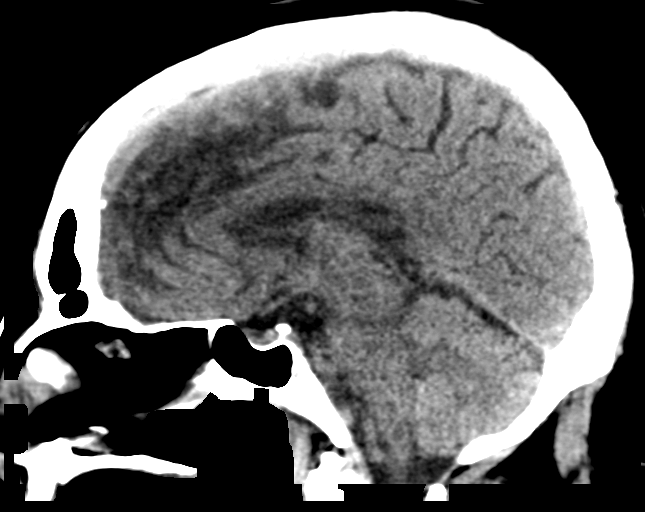
[im 32/48  brain]
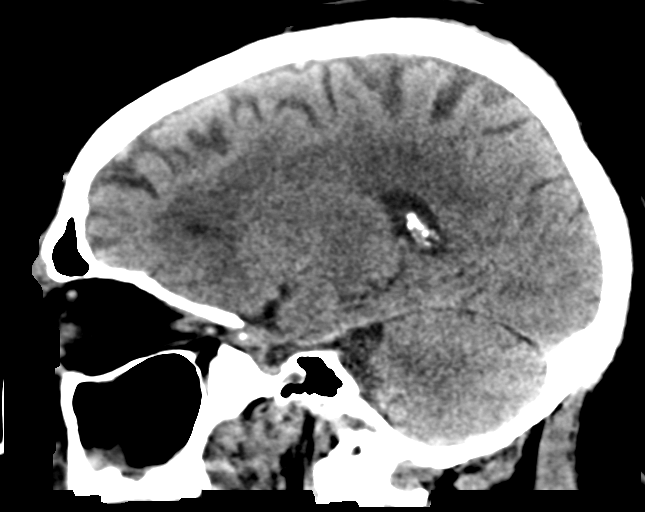

[16 of 47 positions shown; findings below may reference images not displayed]

FINDINGS: Brain: Moderate low density in the periventricular white matter
likely related to small vessel disease. With No mass lesion,
hemorrhage, hydrocephalus, acute infarct, intra-axial, or
extra-axial fluid collection.

Vascular: Intracranial atherosclerosis.

Skull: Again identified is right supraorbital mild scalp soft tissue
swelling. Normal skull.

Sinuses/Orbits: Normal imaged portions of the orbits and globes.
Mucosal thickening of bilateral maxillary sinuses. Mucous retention
cyst or polyp in the left sphenoid sinus. Clear mastoid air cells.

Other: None.
IMPRESSION: 1.  No acute intracranial abnormality.
2. Moderate small vessel ischemic change.
3. Sinus disease.
4. Minimal right supraorbital/frontal scalp soft tissue swelling,
similar.
